# Patient Record
Sex: Female | Born: 1955 | Race: White | Hispanic: No | Marital: Single | Smoking: Former smoker
Health system: Southern US, Community
[De-identification: ages and names within clinical notes are randomized; demographics above are authoritative.]

## PROBLEM LIST (undated history)

## (undated) DIAGNOSIS — R200 Anesthesia of skin: Secondary | ICD-10-CM

## (undated) DIAGNOSIS — G8929 Other chronic pain: Secondary | ICD-10-CM

## (undated) DIAGNOSIS — M419 Scoliosis, unspecified: Secondary | ICD-10-CM

## (undated) DIAGNOSIS — E559 Vitamin D deficiency, unspecified: Secondary | ICD-10-CM

## (undated) DIAGNOSIS — G2581 Restless legs syndrome: Secondary | ICD-10-CM

## (undated) DIAGNOSIS — N210 Calculus in bladder: Secondary | ICD-10-CM

## (undated) DIAGNOSIS — Z87898 Personal history of other specified conditions: Secondary | ICD-10-CM

## (undated) DIAGNOSIS — F909 Attention-deficit hyperactivity disorder, unspecified type: Secondary | ICD-10-CM

## (undated) DIAGNOSIS — F329 Major depressive disorder, single episode, unspecified: Secondary | ICD-10-CM

## (undated) DIAGNOSIS — Z8739 Personal history of other diseases of the musculoskeletal system and connective tissue: Secondary | ICD-10-CM

## (undated) DIAGNOSIS — M79606 Pain in leg, unspecified: Secondary | ICD-10-CM

## (undated) DIAGNOSIS — K449 Diaphragmatic hernia without obstruction or gangrene: Secondary | ICD-10-CM

## (undated) DIAGNOSIS — R202 Paresthesia of skin: Secondary | ICD-10-CM

## (undated) DIAGNOSIS — K219 Gastro-esophageal reflux disease without esophagitis: Secondary | ICD-10-CM

## (undated) DIAGNOSIS — Z9289 Personal history of other medical treatment: Secondary | ICD-10-CM

## (undated) DIAGNOSIS — Z973 Presence of spectacles and contact lenses: Secondary | ICD-10-CM

## (undated) DIAGNOSIS — Z87828 Personal history of other (healed) physical injury and trauma: Secondary | ICD-10-CM

## (undated) DIAGNOSIS — K56609 Unspecified intestinal obstruction, unspecified as to partial versus complete obstruction: Secondary | ICD-10-CM

## (undated) DIAGNOSIS — G4700 Insomnia, unspecified: Secondary | ICD-10-CM

## (undated) HISTORY — DX: Vitamin D deficiency, unspecified: E55.9

## (undated) HISTORY — PX: PERCUTANEOUS PINNING TOE FRACTURE: SUR1018

## (undated) HISTORY — DX: Other chronic pain: G89.29

## (undated) HISTORY — DX: Diaphragmatic hernia without obstruction or gangrene: K44.9

## (undated) HISTORY — DX: Scoliosis, unspecified: M41.9

## (undated) HISTORY — DX: Unspecified intestinal obstruction, unspecified as to partial versus complete obstruction: K56.609

## (undated) HISTORY — PX: OTHER SURGICAL HISTORY: SHX169

## (undated) HISTORY — DX: Personal history of other medical treatment: Z92.89

## (undated) HISTORY — DX: Paresthesia of skin: R20.2

## (undated) HISTORY — PX: ANTERIOR AND POSTERIOR REPAIR: SHX1172

---

## 2004-06-10 ENCOUNTER — Emergency Department (HOSPITAL_COMMUNITY): Admission: EM | Admit: 2004-06-10 | Discharge: 2004-06-10 | Payer: Self-pay | Admitting: Emergency Medicine

## 2004-10-15 ENCOUNTER — Emergency Department (HOSPITAL_COMMUNITY): Admission: EM | Admit: 2004-10-15 | Discharge: 2004-10-15 | Payer: Self-pay | Admitting: Family Medicine

## 2004-10-18 ENCOUNTER — Emergency Department (HOSPITAL_COMMUNITY): Admission: EM | Admit: 2004-10-18 | Discharge: 2004-10-18 | Payer: Self-pay | Admitting: Family Medicine

## 2005-01-11 ENCOUNTER — Emergency Department (HOSPITAL_COMMUNITY): Admission: EM | Admit: 2005-01-11 | Discharge: 2005-01-11 | Payer: Self-pay | Admitting: Family Medicine

## 2005-06-21 ENCOUNTER — Ambulatory Visit: Payer: Self-pay | Admitting: Internal Medicine

## 2005-07-08 ENCOUNTER — Ambulatory Visit: Payer: Self-pay | Admitting: Internal Medicine

## 2005-07-09 ENCOUNTER — Emergency Department (HOSPITAL_COMMUNITY): Admission: EM | Admit: 2005-07-09 | Discharge: 2005-07-09 | Payer: Self-pay | Admitting: Family Medicine

## 2005-08-04 ENCOUNTER — Emergency Department (HOSPITAL_COMMUNITY): Admission: EM | Admit: 2005-08-04 | Discharge: 2005-08-04 | Payer: Self-pay | Admitting: Family Medicine

## 2005-09-01 ENCOUNTER — Emergency Department (HOSPITAL_COMMUNITY): Admission: EM | Admit: 2005-09-01 | Discharge: 2005-09-01 | Payer: Self-pay | Admitting: Family Medicine

## 2005-09-20 ENCOUNTER — Encounter: Admission: RE | Admit: 2005-09-20 | Discharge: 2005-09-20 | Payer: Self-pay | Admitting: Family Medicine

## 2005-10-25 ENCOUNTER — Emergency Department (HOSPITAL_COMMUNITY): Admission: EM | Admit: 2005-10-25 | Discharge: 2005-10-25 | Payer: Self-pay | Admitting: Family Medicine

## 2005-12-19 ENCOUNTER — Ambulatory Visit: Payer: Self-pay | Admitting: Internal Medicine

## 2006-01-26 ENCOUNTER — Ambulatory Visit: Payer: Self-pay | Admitting: Internal Medicine

## 2006-02-15 ENCOUNTER — Ambulatory Visit: Payer: Self-pay | Admitting: Internal Medicine

## 2006-02-24 ENCOUNTER — Ambulatory Visit: Payer: Self-pay | Admitting: Internal Medicine

## 2006-03-17 ENCOUNTER — Ambulatory Visit: Payer: Self-pay | Admitting: Internal Medicine

## 2006-03-21 ENCOUNTER — Ambulatory Visit: Payer: Self-pay | Admitting: Internal Medicine

## 2006-03-31 ENCOUNTER — Inpatient Hospital Stay (HOSPITAL_COMMUNITY): Admission: EM | Admit: 2006-03-31 | Discharge: 2006-04-04 | Payer: Self-pay | Admitting: *Deleted

## 2006-03-31 ENCOUNTER — Encounter: Payer: Self-pay | Admitting: Emergency Medicine

## 2006-03-31 ENCOUNTER — Ambulatory Visit: Payer: Self-pay | Admitting: Psychiatry

## 2006-04-19 ENCOUNTER — Emergency Department (HOSPITAL_COMMUNITY): Admission: EM | Admit: 2006-04-19 | Discharge: 2006-04-19 | Payer: Self-pay | Admitting: Family Medicine

## 2006-04-20 ENCOUNTER — Emergency Department (HOSPITAL_COMMUNITY): Admission: EM | Admit: 2006-04-20 | Discharge: 2006-04-20 | Payer: Self-pay | Admitting: Emergency Medicine

## 2006-06-13 ENCOUNTER — Emergency Department (HOSPITAL_COMMUNITY): Admission: EM | Admit: 2006-06-13 | Discharge: 2006-06-13 | Payer: Self-pay | Admitting: Family Medicine

## 2006-06-27 ENCOUNTER — Other Ambulatory Visit: Admission: RE | Admit: 2006-06-27 | Discharge: 2006-06-27 | Payer: Self-pay | Admitting: Obstetrics and Gynecology

## 2006-08-09 ENCOUNTER — Encounter (INDEPENDENT_AMBULATORY_CARE_PROVIDER_SITE_OTHER): Payer: Self-pay | Admitting: Specialist

## 2006-08-10 ENCOUNTER — Inpatient Hospital Stay (HOSPITAL_COMMUNITY): Admission: RE | Admit: 2006-08-10 | Discharge: 2006-08-11 | Payer: Self-pay | Admitting: Obstetrics and Gynecology

## 2006-09-04 ENCOUNTER — Ambulatory Visit: Payer: Self-pay | Admitting: Family Medicine

## 2006-12-08 ENCOUNTER — Ambulatory Visit (HOSPITAL_COMMUNITY): Payer: Self-pay | Admitting: Psychiatry

## 2006-12-28 ENCOUNTER — Ambulatory Visit: Payer: Self-pay | Admitting: Family Medicine

## 2007-01-15 ENCOUNTER — Emergency Department (HOSPITAL_COMMUNITY): Admission: EM | Admit: 2007-01-15 | Discharge: 2007-01-15 | Payer: Self-pay | Admitting: Family Medicine

## 2007-06-25 DIAGNOSIS — F329 Major depressive disorder, single episode, unspecified: Secondary | ICD-10-CM | POA: Insufficient documentation

## 2007-06-26 ENCOUNTER — Ambulatory Visit: Payer: Self-pay | Admitting: Internal Medicine

## 2007-06-26 DIAGNOSIS — G2581 Restless legs syndrome: Secondary | ICD-10-CM | POA: Insufficient documentation

## 2007-07-11 ENCOUNTER — Telehealth (INDEPENDENT_AMBULATORY_CARE_PROVIDER_SITE_OTHER): Payer: Self-pay

## 2007-07-24 ENCOUNTER — Telehealth: Payer: Self-pay | Admitting: Internal Medicine

## 2007-08-27 ENCOUNTER — Ambulatory Visit: Payer: Self-pay | Admitting: Internal Medicine

## 2007-10-09 ENCOUNTER — Ambulatory Visit: Payer: Self-pay | Admitting: Internal Medicine

## 2007-10-31 ENCOUNTER — Ambulatory Visit: Payer: Self-pay | Admitting: Internal Medicine

## 2007-10-31 DIAGNOSIS — J069 Acute upper respiratory infection, unspecified: Secondary | ICD-10-CM | POA: Insufficient documentation

## 2007-11-02 ENCOUNTER — Ambulatory Visit: Payer: Self-pay | Admitting: Internal Medicine

## 2007-12-11 ENCOUNTER — Ambulatory Visit: Payer: Self-pay | Admitting: Internal Medicine

## 2007-12-17 ENCOUNTER — Telehealth: Payer: Self-pay | Admitting: Internal Medicine

## 2007-12-28 ENCOUNTER — Ambulatory Visit: Payer: Self-pay | Admitting: Internal Medicine

## 2008-02-11 ENCOUNTER — Ambulatory Visit: Payer: Self-pay | Admitting: Internal Medicine

## 2008-02-11 DIAGNOSIS — M542 Cervicalgia: Secondary | ICD-10-CM | POA: Insufficient documentation

## 2008-02-18 ENCOUNTER — Ambulatory Visit: Payer: Self-pay | Admitting: Internal Medicine

## 2008-02-18 DIAGNOSIS — T07XXXA Unspecified multiple injuries, initial encounter: Secondary | ICD-10-CM | POA: Insufficient documentation

## 2008-03-31 ENCOUNTER — Ambulatory Visit: Payer: Self-pay | Admitting: Internal Medicine

## 2008-04-24 ENCOUNTER — Telehealth: Payer: Self-pay | Admitting: Internal Medicine

## 2008-06-20 ENCOUNTER — Telehealth: Payer: Self-pay | Admitting: Family Medicine

## 2008-06-20 ENCOUNTER — Ambulatory Visit: Payer: Self-pay | Admitting: Family Medicine

## 2008-06-20 DIAGNOSIS — S335XXA Sprain of ligaments of lumbar spine, initial encounter: Secondary | ICD-10-CM | POA: Insufficient documentation

## 2008-06-21 ENCOUNTER — Ambulatory Visit: Payer: Self-pay | Admitting: Family Medicine

## 2008-06-25 ENCOUNTER — Emergency Department (HOSPITAL_COMMUNITY): Admission: EM | Admit: 2008-06-25 | Discharge: 2008-06-25 | Payer: Self-pay | Admitting: Emergency Medicine

## 2008-06-25 ENCOUNTER — Telehealth: Payer: Self-pay | Admitting: Internal Medicine

## 2008-07-07 ENCOUNTER — Ambulatory Visit: Payer: Self-pay | Admitting: Internal Medicine

## 2008-07-08 ENCOUNTER — Telehealth (INDEPENDENT_AMBULATORY_CARE_PROVIDER_SITE_OTHER): Payer: Self-pay | Admitting: *Deleted

## 2008-07-08 ENCOUNTER — Telehealth: Payer: Self-pay | Admitting: Internal Medicine

## 2008-08-25 ENCOUNTER — Ambulatory Visit: Payer: Self-pay | Admitting: Internal Medicine

## 2008-08-28 ENCOUNTER — Telehealth (INDEPENDENT_AMBULATORY_CARE_PROVIDER_SITE_OTHER): Payer: Self-pay

## 2008-09-01 ENCOUNTER — Ambulatory Visit: Payer: Self-pay | Admitting: Internal Medicine

## 2008-09-01 DIAGNOSIS — M549 Dorsalgia, unspecified: Secondary | ICD-10-CM | POA: Insufficient documentation

## 2008-09-30 ENCOUNTER — Telehealth: Payer: Self-pay | Admitting: Internal Medicine

## 2008-10-02 ENCOUNTER — Ambulatory Visit: Payer: Self-pay | Admitting: Internal Medicine

## 2008-10-02 DIAGNOSIS — M545 Low back pain, unspecified: Secondary | ICD-10-CM | POA: Insufficient documentation

## 2008-10-16 ENCOUNTER — Telehealth: Payer: Self-pay | Admitting: Internal Medicine

## 2008-10-24 ENCOUNTER — Ambulatory Visit: Payer: Self-pay | Admitting: Internal Medicine

## 2008-11-03 ENCOUNTER — Ambulatory Visit: Payer: Self-pay | Admitting: Internal Medicine

## 2008-11-03 DIAGNOSIS — S9030XA Contusion of unspecified foot, initial encounter: Secondary | ICD-10-CM | POA: Insufficient documentation

## 2008-11-10 ENCOUNTER — Telehealth: Payer: Self-pay | Admitting: Internal Medicine

## 2008-11-18 ENCOUNTER — Ambulatory Visit: Payer: Self-pay | Admitting: Internal Medicine

## 2008-11-19 ENCOUNTER — Telehealth: Payer: Self-pay | Admitting: Internal Medicine

## 2008-11-20 ENCOUNTER — Telehealth: Payer: Self-pay | Admitting: Internal Medicine

## 2008-11-27 ENCOUNTER — Telehealth (INDEPENDENT_AMBULATORY_CARE_PROVIDER_SITE_OTHER): Payer: Self-pay | Admitting: *Deleted

## 2008-12-01 ENCOUNTER — Ambulatory Visit: Payer: Self-pay | Admitting: Internal Medicine

## 2008-12-01 DIAGNOSIS — R109 Unspecified abdominal pain: Secondary | ICD-10-CM | POA: Insufficient documentation

## 2008-12-02 ENCOUNTER — Telehealth: Payer: Self-pay | Admitting: Internal Medicine

## 2008-12-10 ENCOUNTER — Ambulatory Visit: Payer: Self-pay | Admitting: Internal Medicine

## 2009-02-02 ENCOUNTER — Emergency Department (HOSPITAL_COMMUNITY): Admission: EM | Admit: 2009-02-02 | Discharge: 2009-02-02 | Payer: Self-pay | Admitting: Emergency Medicine

## 2009-02-03 ENCOUNTER — Ambulatory Visit: Payer: Self-pay | Admitting: Internal Medicine

## 2009-02-03 DIAGNOSIS — S91309A Unspecified open wound, unspecified foot, initial encounter: Secondary | ICD-10-CM | POA: Insufficient documentation

## 2009-03-06 ENCOUNTER — Telehealth: Payer: Self-pay | Admitting: Internal Medicine

## 2009-03-09 ENCOUNTER — Telehealth: Payer: Self-pay | Admitting: Internal Medicine

## 2009-03-09 ENCOUNTER — Ambulatory Visit: Payer: Self-pay | Admitting: Internal Medicine

## 2009-03-13 ENCOUNTER — Telehealth: Payer: Self-pay | Admitting: Internal Medicine

## 2009-03-25 ENCOUNTER — Ambulatory Visit: Payer: Self-pay | Admitting: Family Medicine

## 2009-03-27 ENCOUNTER — Telehealth: Payer: Self-pay | Admitting: Family Medicine

## 2009-04-14 ENCOUNTER — Ambulatory Visit: Payer: Self-pay | Admitting: Internal Medicine

## 2009-04-15 ENCOUNTER — Telehealth: Payer: Self-pay | Admitting: Internal Medicine

## 2009-04-17 ENCOUNTER — Telehealth: Payer: Self-pay | Admitting: Internal Medicine

## 2009-04-17 ENCOUNTER — Encounter (INDEPENDENT_AMBULATORY_CARE_PROVIDER_SITE_OTHER): Payer: Self-pay | Admitting: *Deleted

## 2009-04-22 ENCOUNTER — Ambulatory Visit: Payer: Self-pay | Admitting: Internal Medicine

## 2009-04-22 DIAGNOSIS — IMO0002 Reserved for concepts with insufficient information to code with codable children: Secondary | ICD-10-CM | POA: Insufficient documentation

## 2009-04-23 ENCOUNTER — Ambulatory Visit: Payer: Self-pay | Admitting: Internal Medicine

## 2009-04-24 ENCOUNTER — Telehealth: Payer: Self-pay | Admitting: Internal Medicine

## 2009-04-27 ENCOUNTER — Telehealth: Payer: Self-pay | Admitting: Internal Medicine

## 2009-04-27 ENCOUNTER — Telehealth: Payer: Self-pay | Admitting: Family Medicine

## 2009-04-28 ENCOUNTER — Ambulatory Visit: Payer: Self-pay | Admitting: Internal Medicine

## 2009-04-28 LAB — CONVERTED CEMR LAB
Glucose, Urine, Semiquant: NEGATIVE
pH: 5

## 2009-04-30 ENCOUNTER — Telehealth: Payer: Self-pay | Admitting: Internal Medicine

## 2009-04-30 ENCOUNTER — Encounter (INDEPENDENT_AMBULATORY_CARE_PROVIDER_SITE_OTHER): Payer: Self-pay | Admitting: *Deleted

## 2009-05-05 ENCOUNTER — Ambulatory Visit: Payer: Self-pay | Admitting: Internal Medicine

## 2009-05-07 ENCOUNTER — Telehealth: Payer: Self-pay | Admitting: Internal Medicine

## 2009-05-11 ENCOUNTER — Telehealth: Payer: Self-pay | Admitting: Internal Medicine

## 2009-06-22 ENCOUNTER — Telehealth: Payer: Self-pay | Admitting: Internal Medicine

## 2009-06-22 ENCOUNTER — Ambulatory Visit: Payer: Self-pay | Admitting: Internal Medicine

## 2009-06-30 ENCOUNTER — Ambulatory Visit: Payer: Self-pay | Admitting: Internal Medicine

## 2009-07-22 ENCOUNTER — Ambulatory Visit: Payer: Self-pay | Admitting: Internal Medicine

## 2009-08-28 ENCOUNTER — Ambulatory Visit: Payer: Self-pay | Admitting: Internal Medicine

## 2009-08-28 DIAGNOSIS — L570 Actinic keratosis: Secondary | ICD-10-CM | POA: Insufficient documentation

## 2009-09-17 ENCOUNTER — Telehealth: Payer: Self-pay | Admitting: Internal Medicine

## 2009-10-30 ENCOUNTER — Ambulatory Visit: Payer: Self-pay | Admitting: Internal Medicine

## 2010-01-26 ENCOUNTER — Ambulatory Visit: Payer: Self-pay | Admitting: Internal Medicine

## 2010-01-26 DIAGNOSIS — G4700 Insomnia, unspecified: Secondary | ICD-10-CM | POA: Insufficient documentation

## 2010-02-04 ENCOUNTER — Encounter: Payer: Self-pay | Admitting: Internal Medicine

## 2010-02-04 ENCOUNTER — Ambulatory Visit: Payer: Self-pay | Admitting: Family Medicine

## 2010-02-04 DIAGNOSIS — S90129A Contusion of unspecified lesser toe(s) without damage to nail, initial encounter: Secondary | ICD-10-CM | POA: Insufficient documentation

## 2010-04-19 ENCOUNTER — Telehealth: Payer: Self-pay | Admitting: Internal Medicine

## 2010-04-19 ENCOUNTER — Ambulatory Visit: Payer: Self-pay | Admitting: Internal Medicine

## 2010-04-21 ENCOUNTER — Ambulatory Visit: Payer: Self-pay | Admitting: Internal Medicine

## 2010-05-04 ENCOUNTER — Telehealth: Payer: Self-pay

## 2010-05-11 ENCOUNTER — Telehealth: Payer: Self-pay | Admitting: Internal Medicine

## 2010-05-13 ENCOUNTER — Telehealth: Payer: Self-pay

## 2010-06-10 ENCOUNTER — Ambulatory Visit: Payer: Self-pay | Admitting: Internal Medicine

## 2010-06-16 ENCOUNTER — Telehealth: Payer: Self-pay | Admitting: Internal Medicine

## 2010-07-08 ENCOUNTER — Encounter: Payer: Self-pay | Admitting: Internal Medicine

## 2010-07-08 ENCOUNTER — Encounter
Admission: RE | Admit: 2010-07-08 | Discharge: 2010-07-28 | Payer: Self-pay | Source: Home / Self Care | Attending: Internal Medicine | Admitting: Internal Medicine

## 2010-07-19 ENCOUNTER — Ambulatory Visit: Payer: Self-pay | Admitting: Internal Medicine

## 2010-07-23 ENCOUNTER — Ambulatory Visit: Payer: Self-pay | Admitting: Internal Medicine

## 2010-07-28 ENCOUNTER — Ambulatory Visit: Payer: Self-pay | Admitting: Internal Medicine

## 2010-08-22 ENCOUNTER — Encounter: Payer: Self-pay | Admitting: Emergency Medicine

## 2010-08-24 ENCOUNTER — Ambulatory Visit
Admission: RE | Admit: 2010-08-24 | Discharge: 2010-08-24 | Payer: Self-pay | Source: Home / Self Care | Attending: Internal Medicine | Admitting: Internal Medicine

## 2010-08-24 ENCOUNTER — Encounter: Payer: Self-pay | Admitting: Internal Medicine

## 2010-08-25 ENCOUNTER — Telehealth: Payer: Self-pay

## 2010-08-31 NOTE — Assessment & Plan Note (Signed)
Summary: CONSULT RE: TOE FUNGUS/CJR----PT [facility] // RS   Vital Signs:  [name] profile:   55 year old female Weight:      126 pounds Temp:     98.2 degrees F oral BP sitting:   110 / 80  (left arm) Cuff size:   regular  Vitals Entered By: Duard Brady LPN (April 19, 2010 11:35 AM) CC: c/o (R) great toe nail fungus - also needs refills on meds     declines flu vaccine Is [name] Diabetic? No   CC:  c/o (R) great toe nail fungus - also needs refills on meds     declines flu vaccine.  History of Present Illness:  Deborah [name] who is seen today for follow-up.  She has a history of anxiety, depression, which has been stable.  She has insomnia, which is also recently well controlled.  She is in today for medicine refills.  She has occasional low back pain, which is not a complaint today. she is concerned about a possible toenail fungus involving her left great toenail  Allergies (verified): No Known Drug Allergies  Past History:  Past Medical History: Reviewed history from 10/02/2008 and no changes required. Depression-bipolar Low back pain  Physical Exam  General:  Well-developed,well-nourished,in no acute distress; alert,appropriate and cooperative throughout examination Skin:  the left great toenail was slightly discolored, but not thickened or distorted Psych:  Cognition and judgment appear intact. Alert and cooperative with normal attention span and concentration. No apparent delusions, illusions, hallucinations   Impression & Recommendations:  Problem # 1:  BACK PAIN (ICD-724.5)  Her updated medication list for this problem includes:    Hydrocodone-acetaminophen 5-500 Mg Tabs (Hydrocodone-acetaminophen) ..... One every 6 hours as needed for pain    Ibuprofen 200 Mg Tabs (Ibuprofen) .Marland Kitchen... 2 tis  Her updated medication list for this problem includes:    Hydrocodone-acetaminophen 5-500 Mg Tabs (Hydrocodone-acetaminophen) ..... One every 6 hours as needed for  pain    Ibuprofen 200 Mg Tabs (Ibuprofen) .Marland Kitchen... 2 tis  Problem # 2:  DEPRESSION (ICD-311)  Her updated medication list for this problem includes:    Budeprion Xl 150 Mg Xr24h-tab (Bupropion hcl) .Marland KitchenMarland KitchenMarland KitchenMarland Kitchen 3 tablets daily  Her updated medication list for this problem includes:    Budeprion Xl 150 Mg Xr24h-tab (Bupropion hcl) .Marland KitchenMarland KitchenMarland KitchenMarland Kitchen 3 tablets daily  Complete Medication List: 1)  Budeprion Xl 150 Mg Xr24h-tab (Bupropion hcl) .... 3 tablets daily 2)  Hydrocodone-acetaminophen 5-500 Mg Tabs (Hydrocodone-acetaminophen) .... One every 6 hours as needed for pain 3)  Retin-a 0.1 % Crea (Tretinoin) .... Use  at night 4)  Ibuprofen 200 Mg Tabs (Ibuprofen) .... 2 tis 5)  Zolpidem Tartrate 5 Mg Tabs (Zolpidem tartrate) .... One at bedtime as needed for sleep  [name] Instructions: 1)  Please schedule a follow-up appointment in 3 months. 2)  It is important that you exercise regularly at least 20 minutes 5 times a week. If you develop chest pain, have severe difficulty breathing, or feel very tired , stop exercising immediately and seek medical attention. Prescriptions: ZOLPIDEM TARTRATE 5 MG TABS (ZOLPIDEM TARTRATE) one at bedtime as needed for sleep  #30 x 2   Entered and Authorized by:   Gordy Savers  MD   Signed by:   Gordy Savers  MD on 04/19/2010   Method used:   Print then Give to [name]   RxID:   [id number] RETIN-A 0.1 % CREA (TRETINOIN) use  at night  #30gm x 3   Entered  and Authorized by:   Gordy Savers  MD   Signed by:   Gordy Savers  MD on 04/19/2010   Method used:   Print then Give to [name]   RxID:   [id number] HYDROCODONE-ACETAMINOPHEN 5-500 MG TABS (HYDROCODONE-ACETAMINOPHEN) one every 6 hours as needed for pain  #60 x 3   Entered and Authorized by:   Gordy Savers  MD   Signed by:   Gordy Savers  MD on 04/19/2010   Method used:   Print then Give to [name]   RxID:   [id number] BUDEPRION XL 150 MG XR24H-TAB (BUPROPION HCL)  3 tablets daily  #90 x 3   Entered and Authorized by:   Gordy Savers  MD   Signed by:   Gordy Savers  MD on 04/19/2010   Method used:   Print then Give to [name]   RxID:   [id number]

## 2010-08-31 NOTE — Progress Notes (Signed)
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 Pt will bring forms by when she returns back to town.  Initial call taken by: Lucy Antigua,  May 04, 2010 10:53 AM  Follow-up for Phone Call        noted   KIK Follow-up by: Duard Brady LPN,  May 04, 2010 11:10 AM

## 2010-08-31 NOTE — Assessment & Plan Note (Signed)
Summary: suspicious looking moles on neck and stomach/cjr/pt rsc/cjr   Vital Signs:  Patient profile:   55 year old female Weight:      125 pounds BP sitting:   114 / 82  (left arm) Cuff size:   regular  Vitals Entered By: Raechel Ache, RN (August 28, 2009 4:05 PM)   CC: Check skin spots. Is Patient Diabetic? No   CC:  Check skin spots..  History of Present Illness: 55 year old patient who is seen today concerned about a lesion involving her right upper anterior chest.  This has been present for several weeks and has unchanged.  It involves an area of previous considerable some exposure.  The lesion was consistent with a small actinic keratosis. She has a history of depression, which has been stable.  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    Physical Exam  General:  Well-developed,well-nourished,in no acute distress; alert,appropriate and cooperative throughout examination Skin:  a slightly raised papular scaly lesion 3 to 4 mm noted involving her right upper anterior chest.  This was consistent with a small actinic keratosis   Impression & Recommendations:  Problem # 1:  BACK PAIN (ICD-724.5)  The following medications were removed from the medication list:    Cyclobenzaprine Hcl 5 Mg Tabs (Cyclobenzaprine hcl) ..... One at bedtime Her updated medication list for this problem includes:    Hydrocodone-acetaminophen 5-500 Mg Tabs (Hydrocodone-acetaminophen) ..... One every 6 hours as needed for pain  Her updated medication list for this problem includes:    Cyclobenzaprine Hcl 5 Mg Tabs (Cyclobenzaprine hcl) ..... One at bedtime    Hydrocodone-acetaminophen 5-500 Mg Tabs (Hydrocodone-acetaminophen) ..... One every 6 hours as needed for pain  Problem # 2:  DEPRESSION (ICD-311)  Her updated medication list for this  problem includes:    Budeprion Xl 150 Mg Xr24h-tab (Bupropion hcl) .Marland KitchenMarland KitchenMarland KitchenMarland Kitchen 3 tablets daily    Prozac 40 Mg Caps (Fluoxetine hcl) .Marland Kitchen... 1 by mouth once daily    Nortriptyline Hcl 25 Mg Caps (Nortriptyline hcl) ..... One daily  Her updated medication list for this problem includes:    Budeprion Xl 150 Mg Xr24h-tab (Bupropion hcl) .Marland KitchenMarland KitchenMarland KitchenMarland Kitchen 3 tablets daily    Prozac 40 Mg Caps (Fluoxetine hcl) .Marland Kitchen... 1 by mouth once daily    Nortriptyline Hcl 25 Mg Caps (Nortriptyline hcl) ..... One daily  Problem # 3:  ACTINIC KERATOSIS (ICD-702.0)  treated with cryotherapy  Orders: Cryotherapy/Destruction benign or premalignant lesion (1st lesion)  ([address])  Complete Medication List: 1)  Budeprion Xl 150 Mg Xr24h-tab (Bupropion hcl) .... 3 tablets daily 2)  Prozac 40 Mg Caps (Fluoxetine hcl) .Marland Kitchen.. 1 by mouth once daily 3)  Nortriptyline Hcl 25 Mg Caps (Nortriptyline hcl) .... One daily 4)  Hydrocodone-acetaminophen 5-500 Mg Tabs (Hydrocodone-acetaminophen) .... One every 6 hours as needed for pain 5)  Retin-a 0.1 % Crea (Tretinoin) .... Use  at night  Patient Instructions: 1)  Please schedule a follow-up appointment in 3 months. 2)  It is important that you exercise regularly at least 20 minutes 5 times a week. If you develop chest pain, have severe difficulty breathing, or feel very tired , stop exercising immediately and seek medical attention. Prescriptions: HYDROCODONE-ACETAMINOPHEN 5-500 MG TABS (HYDROCODONE-ACETAMINOPHEN) one every 6 hours as needed for pain  #120 x 2   Entered and Authorized by:  Gordy Savers  MD   Signed by:   Gordy Savers  MD on 08/28/2009   Method used:   Print then Give to Patient   RxID:   [id number]

## 2010-08-31 NOTE — Assessment & Plan Note (Signed)
Summary: tb test reading 1pm ok per kim/njr   Nurse Visit   Allergies: No Known Drug Allergies  PPD Results    Date of reading: 04/21/2010    Results: < 5mm    Interpretation: negative

## 2010-08-31 NOTE — Progress Notes (Signed)
Summary: call  Phone Note Call from Patient Call back at Home Phone [phone]   Caller: Patient---live call Reason for Call: Talk to Nurse Summary of Call: wants kim to return call. Initial call taken by: Warnell Forester,  September 17, 2009 12:51 PM  Follow-up for Phone Call        attempt to call - ans mach . LMTCB .  Follow-up by: Duard Brady LPN,  September 17, 2009 2:15 PM

## 2010-08-31 NOTE — Miscellaneous (Signed)
Summary: Controlled Substances Contract  Controlled Substances Contract   Imported By: Maryln Gottron 02/05/2010 14:56:24  _____________________________________________________________________  External Attachment:    Type:   Image     Comment:   External Document

## 2010-08-31 NOTE — Progress Notes (Signed)
Summary: REQ FOR RETURN CALL  Phone Note Call from Patient   Caller: Patient   [phone] Summary of Call: Pt would like to have a return call from Selena Batten, [place].... Marland KitchenPt would not elaborate as to the reason why, just said she needs to speak with Selena Batten, RN..... Pt can be reached at [phone].  Initial call taken by: Debbra Riding,  May 13, 2010 1:38 PM  Follow-up for Phone Call        returned call to pt - pt upset because according to her - she did not request early refill and is [facility] upest [facility] for [facility]. she is going to pharmacy tomorrow to discuss with pharmacist and manager. KIK Follow-up by: Duard Brady LPN,  May 13, 2010 2:06 PM

## 2010-08-31 NOTE — Assessment & Plan Note (Signed)
Summary: pt jammed big toe/painful/cjr   Vital Signs:  Patient profile:   55 year old female Weight:      122 pounds Temp:     98.2 degrees F oral BP sitting:   120 / 80  (left arm) Cuff size:   regular  Vitals Entered By: Kathrynn Speed CMA (February 04, 2010 12:15 PM) CC: Pt jammed big right toe, painful, x 4 days,src   CC:  Pt jammed big right toe, painful, x 4 days, and src.  History of Present Illness: Deborah [name] is a 55 year old female, who comes in today for evaluation of two problems.  She states she jammed [place] the airport [place] and wants it checked.  There is no bleeding.  No swelling.  She also states they lost her luggage with her medication.  She wants an early refill on her Wellbutrin and her Vicodin.  She states that she used to take Darvocet now.  She takes 3 to 4 Vicodin a day because of back and neck pain.  She states the etiology of her pain is muscle spasm and stress.  I inquired if she had a neurologic evaluation, but she was extremely vague.  I told her I would refill her medications only until Dr. Kirtland Bouchard  comes back and that she will need to sign a pain contract  Current Medications (verified): 1)  Budeprion Xl 150 Mg Xr24h-Tab (Bupropion Hcl) .... 3 Tablets Daily 2)  Hydrocodone-Acetaminophen 5-500 Mg Tabs (Hydrocodone-Acetaminophen) .... One Every 6 Hours As Needed For Pain 3)  Retin-A 0.1 % Crea (Tretinoin) .... Use  At Night 4)  Ibuprofen 200 Mg Tabs (Ibuprofen) .... 2 Tis 5)  Zolpidem Tartrate 5 Mg Tabs (Zolpidem Tartrate) .... One At Bedtime As Needed For Sleep  Allergies (verified): No Known Drug Allergies  Review of Systems      See HPI  Physical Exam  General:  Well-developed,well-nourished,in no acute distress; alert,appropriate and cooperative throughout examination Msk:  examination of the toe shows no evidence of any swelling, bleeding, or deformity. Pulses:  R and L carotid,radial,femoral,dorsalis pedis and posterior tibial  pulses are full and equal bilaterally   Impression & Recommendations:  Problem # 1:  CONTUSION OF TOE (ICD-924.3) Assessment New  Complete Medication List: 1)  Budeprion Xl 150 Mg Xr24h-tab (Bupropion hcl) .... 3 tablets daily 2)  Hydrocodone-acetaminophen 5-500 Mg Tabs (Hydrocodone-acetaminophen) .... One every 6 hours as needed for pain 3)  Retin-a 0.1 % Crea (Tretinoin) .... Use  at night 4)  Ibuprofen 200 Mg Tabs (Ibuprofen) .... 2 tis 5)  Zolpidem Tartrate 5 Mg Tabs (Zolpidem tartrate) .... One at bedtime as needed for sleep  Other Orders: Capillary Blood Glucose/CBG ([address])  Patient Instructions: 1)  take 600 mg of Motrin 3 times a day with food and elevation, ice 15 minutes 4 times a day for pain in your foot. 2)  We will call the pharmacy to get urine early refill on the Wellbutrin and he Uy have to call Dr. Kirtland Bouchard. for follow-up on your narcotics Prescriptions: BUDEPRION XL 150 MG XR24H-TAB (BUPROPION HCL) 3 tablets daily  #90 x 0   Entered by:   Kern Reap CMA (AAMA)   Authorized by:   Roderick Pee MD   Signed by:   Kern Reap CMA (AAMA) on 02/04/2010   Method used:   Telephoned to ...       [facility] [address] [id number] (retail)       [address]       [place], [place]  [address]       Ph: [phone] or [phone]       Fax: [phone]   RxID:   [id number] HYDROCODONE-ACETAMINOPHEN 5-500 MG TABS (HYDROCODONE-ACETAMINOPHEN) one every 6 hours as needed for pain  #15 x 0   Entered and Authorized by:   Roderick Pee MD   Signed by:   Roderick Pee MD on 02/04/2010   Method used:   Print then Give to Patient   RxID:   [id number]

## 2010-08-31 NOTE — Progress Notes (Signed)
Summary: pain med rx  Phone Note Call from Patient   Caller: Patient Call For: Gordy Savers  MD Summary of Call: pt requesting #120 on pain med due to being out of town for extended amounts of time. Can pick up new rx on Wed. When she comes back to TB reading. KIK Initial call taken by: Duard Brady LPN,  April 19, 2010 3:51 PM  Follow-up for Phone Call        noted Follow-up by: Gordy Savers  MD,  April 19, 2010 5:17 PM

## 2010-08-31 NOTE — Progress Notes (Signed)
Summary: early refill - denied  Phone Note Refill Request Message from:  Pharmacy-pat on  [address] [phone]  Refills Requested: Medication #1:  HYDROCODONE-ACETAMINOPHEN 5-500 MG TABS one every 6 hours as needed for pain pt would like a early refill  Initial call taken by: Heron Sabins,  May 11, 2010 8:30 AM  Follow-up for Phone Call        unable to do according to pain contract that we both signed Follow-up by: Gordy Savers  MD,  May 11, 2010 8:58 AM  Additional Follow-up for Phone Call Additional follow up Details #1::        spoke with pat at [facility] - no refill due to contract signed when in office. Pt will need to call us and be seen if needed. KIK Additional Follow-up by: Duard Brady LPN,  May 11, 2010 9:17 AM    New/Updated Medications: HYDROCODONE-ACETAMINOPHEN 5-500 MG TABS (HYDROCODONE-ACETAMINOPHEN) one every 6 hours as needed for pain

## 2010-08-31 NOTE — Assessment & Plan Note (Signed)
Summary: med check/cjr/pt rsc/cjr   Vital Signs:  Patient profile:   55 year old female Weight:      126 pounds Temp:     98.4 degrees F oral BP sitting:   110 / 80  (right arm) Cuff size:   regular  Vitals Entered By: Duard Brady LPN (June 10, 2010 10:50 AM) CC: medication review Is Patient Diabetic? No   CC:  medication review.  History of Present Illness: 55 year old patient who is seen today for follow-up; she has a history of depression, as well as chronic back pain.  She is requesting a physical therapy referral.  Due to upper back and neck pain.  She has lost her job in [place] and has relocated back to [place].  She will need early refills, due to the fact that her medications are in [place], and she does not intend to return.  Allergies (verified): No Known Drug Allergies  Review of Systems       The patient complains of depression.  The patient denies anorexia, fever, weight loss, weight gain, vision loss, decreased hearing, hoarseness, chest pain, syncope, dyspnea on exertion, peripheral edema, prolonged cough, headaches, hemoptysis, abdominal pain, melena, hematochezia, severe indigestion/heartburn, hematuria, incontinence, genital sores, muscle weakness, suspicious skin lesions, transient blindness, difficulty walking, unusual weight change, abnormal bleeding, enlarged lymph nodes, angioedema, and breast masses.    Physical Exam  General:  Well-developed,well-nourished,in no acute distress; alert,appropriate and cooperative throughout examination Head:  Normocephalic and atraumatic without obvious abnormalities. No apparent alopecia or balding. Neck:  No deformities, masses, or tenderness noted. Lungs:  Normal respiratory effort, chest expands symmetrically. Lungs are clear to auscultation, no crackles or wheezes. Heart:  Normal rate and regular rhythm. S1 and S2 normal without gallop, murmur, click, rub or other extra sounds.   Impression &  Recommendations:  Problem # 1:  LOW BACK PAIN (ICD-724.2)  Her updated medication list for this problem includes:    Hydrocodone-acetaminophen 5-500 Mg Tabs (Hydrocodone-acetaminophen) ..... One every 6 hours as needed for pain    Ibuprofen 200 Mg Tabs (Ibuprofen) .Marland Kitchen... 2 tis  Her updated medication list for this problem includes:    Hydrocodone-acetaminophen 5-500 Mg Tabs (Hydrocodone-acetaminophen) ..... One every 6 hours as needed for pain    Ibuprofen 200 Mg Tabs (Ibuprofen) .Marland Kitchen... 2 tis  Problem # 2:  DEPRESSION (ICD-311)  Her updated medication list for this problem includes:    Budeprion Xl 150 Mg Xr24h-tab (Bupropion hcl) .Marland KitchenMarland KitchenMarland KitchenMarland Kitchen 3  tablets daily  Her updated medication list for this problem includes:    Budeprion Xl 150 Mg Xr24h-tab (Bupropion hcl) .Marland KitchenMarland KitchenMarland KitchenMarland Kitchen 3  tablets daily  Complete Medication List: 1)  Budeprion Xl 150 Mg Xr24h-tab (Bupropion hcl) .... 3  tablets daily 2)  Hydrocodone-acetaminophen 5-500 Mg Tabs (Hydrocodone-acetaminophen) .... One every 6 hours as needed for pain 3)  Retin-a 0.1 % Crea (Tretinoin) .... Use  at night 4)  Ibuprofen 200 Mg Tabs (Ibuprofen) .... 2 tis 5)  Zolpidem Tartrate 5 Mg Tabs (Zolpidem tartrate) .... One at bedtime as needed for sleep  Other Orders: Physical Therapy Referral (PT)  Patient Instructions: 1)  physical therapy referral, as scheduled 2)  Please schedule a follow-up appointment in 3 months. 3)  It is important that you exercise regularly at least 20 minutes 5 times a week. If you develop chest pain, have severe difficulty breathing, or feel very tired , stop exercising immediately and seek medical attention. Prescriptions: BUDEPRION XL 150 MG XR24H-TAB (BUPROPION HCL) 2  tablets  daily  #28 x 0   Entered and Authorized by:   Gordy Savers  MD   Signed by:   Gordy Savers  MD on 06/10/2010   Method used:   Print then Give to Patient   RxID:   [id number] HYDROCODONE-ACETAMINOPHEN 5-500 MG TABS  (HYDROCODONE-ACETAMINOPHEN) one every 6 hours as needed for pain  #90 x 2   Entered and Authorized by:   Gordy Savers  MD   Signed by:   Gordy Savers  MD on 06/10/2010   Method used:   Print then Give to Patient   RxID:   [id number] BUDEPRION XL 150 MG XR24H-TAB (BUPROPION HCL) 3 tablets daily  #90 x 3   Entered and Authorized by:   Gordy Savers  MD   Signed by:   Gordy Savers  MD on 06/10/2010   Method used:   Print then Give to Patient   RxID:   [id number]    Orders Added: 1)  Est. Patient Level III [01027] 2)  Physical Therapy Referral [PT]

## 2010-08-31 NOTE — Progress Notes (Signed)
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 Amador Cunas. KIK

## 2010-08-31 NOTE — Assessment & Plan Note (Signed)
Summary: fu per pt/njr----PT [facility] // RS   Vital Signs:  Patient profile:   55 year old female Weight:      126 pounds Temp:     98.0 degrees F oral BP sitting:   110 / 76  (left arm) Cuff size:   regular  Vitals Entered By: Duard Brady LPN (January 26, 2010 8:01 AM) CC: f/u - has questions Is Patient Diabetic? No   CC:  f/u - has questions.  History of Present Illness: 55 year old patient who has a history of anxiety, depression, chronic low back pain.  She also complains of some insomnia.  Today she is followed by a Veterinary surgeon.  She self discontinued Prozac and may and has done well without this medication for the past month.  She states that she was having suicidal ideation.  Remains on Wellbutrin. should also is requesting consideration for hormone replacement therapy.  She has been postmenopausal for approximately 10 years.  Risks and benefits of treatment discussed  Allergies (verified): No Known Drug Allergies  Past History:  Past Medical History: Reviewed history from 10/02/2008 and no changes required. Depression-bipolar Low back pain  Review of Systems       The patient complains of depression.  The patient denies anorexia, fever, weight loss, weight gain, vision loss, decreased hearing, hoarseness, chest pain, syncope, dyspnea on exertion, peripheral edema, prolonged cough, headaches, hemoptysis, abdominal pain, melena, hematochezia, severe indigestion/heartburn, hematuria, incontinence, genital sores, muscle weakness, suspicious skin lesions, transient blindness, difficulty walking, unusual weight change, abnormal bleeding, enlarged lymph nodes, angioedema, and breast masses.    Physical Exam  General:  Well-developed,well-nourished,in no acute distress; alert,appropriate and cooperative throughout examination Head:  Normocephalic and atraumatic without obvious abnormalities. No apparent alopecia or balding. Eyes:  No corneal or conjunctival inflammation noted.  EOMI. Perrla. Funduscopic exam benign, without hemorrhages, exudates or papilledema. Vision grossly normal. Mouth:  Oral mucosa and oropharynx without lesions or exudates.  Teeth in good repair. Neck:  No deformities, masses, or tenderness noted. Lungs:  Normal respiratory effort, chest expands symmetrically. Lungs are clear to auscultation, no crackles or wheezes. Heart:  Normal rate and regular rhythm. S1 and S2 normal without gallop, murmur, click, rub or other extra sounds.   Impression & Recommendations:  Problem # 1:  INSOMNIA (ICD-780.52)  Her updated medication list for this problem includes:    Zolpidem Tartrate 5 Mg Tabs (Zolpidem tartrate) ..... One at bedtime as needed for sleep  Her updated medication list for this problem includes:    Zolpidem Tartrate 5 Mg Tabs (Zolpidem tartrate) ..... One at bedtime as needed for sleep  Problem # 2:  LOW BACK PAIN (ICD-724.2)  Her updated medication list for this problem includes:    Hydrocodone-acetaminophen 5-500 Mg Tabs (Hydrocodone-acetaminophen) ..... One every 6 hours as needed for pain    Ibuprofen 200 Mg Tabs (Ibuprofen) .Marland Kitchen... 2 tis  Her updated medication list for this problem includes:    Hydrocodone-acetaminophen 5-500 Mg Tabs (Hydrocodone-acetaminophen) ..... One every 6 hours as needed for pain    Ibuprofen 200 Mg Tabs (Ibuprofen) .Marland Kitchen... 2 tis  Problem # 3:  DEPRESSION (ICD-311)  The following medications were removed from the medication list:    Prozac 40 Mg Caps (Fluoxetine hcl) .Marland Kitchen... 1 by mouth once daily Her updated medication list for this problem includes:    Budeprion Xl 150 Mg Xr24h-tab (Bupropion hcl) .Marland KitchenMarland KitchenMarland KitchenMarland Kitchen 3 tablets daily  The following medications were removed from the medication list:    Prozac 40 Mg  Caps (Fluoxetine hcl) .Marland Kitchen... 1 by mouth once daily Her updated medication list for this problem includes:    Budeprion Xl 150 Mg Xr24h-tab (Bupropion hcl) .Marland KitchenMarland KitchenMarland KitchenMarland Kitchen 3 tablets daily  Complete Medication List: 1)   Budeprion Xl 150 Mg Xr24h-tab (Bupropion hcl) .... 3 tablets daily 2)  Hydrocodone-acetaminophen 5-500 Mg Tabs (Hydrocodone-acetaminophen) .... One every 6 hours as needed for pain 3)  Retin-a 0.1 % Crea (Tretinoin) .... Use  at night 4)  Ibuprofen 200 Mg Tabs (Ibuprofen) .... 2 tis 5)  Zolpidem Tartrate 5 Mg Tabs (Zolpidem tartrate) .... One at bedtime as needed for sleep  Patient Instructions: 1)  follow-up with your counselor, as scheduled 2)  Please schedule a follow-up appointment in 3 months. 3)  It is important that you exercise regularly at least 20 minutes 5 times a week. If you develop chest pain, have severe difficulty breathing, or feel very tired , stop exercising immediately and seek medical attention. Prescriptions: ZOLPIDEM TARTRATE 5 MG TABS (ZOLPIDEM TARTRATE) one at bedtime as needed for sleep  #30 x 2   Entered and Authorized by:   Gordy Savers  MD   Signed by:   Gordy Savers  MD on 01/26/2010   Method used:   Print then Mail to Patient   RxID:   [id number] HYDROCODONE-ACETAMINOPHEN 5-500 MG TABS (HYDROCODONE-ACETAMINOPHEN) one every 6 hours as needed for pain  #120 x 3   Entered and Authorized by:   Gordy Savers  MD   Signed by:   Gordy Savers  MD on 01/26/2010   Method used:   Print then Mail to Patient   RxID:   [id number]

## 2010-08-31 NOTE — Assessment & Plan Note (Signed)
Summary: MED CHECK AND REFILLS/CJR   Vital Signs:  [name] profile:   55 year old female Weight:      130 pounds Temp:     98.2 degrees F oral BP sitting:   122 / 76  (right arm) Cuff size:   regular  Vitals Entered By: Duard Brady LPN (October 30, 3293 11:43 AM) CC: f/u med refills - c/o head , neck and shoulder pain Is [name] Diabetic? No   CC:  f/u med refills - c/o head  and neck and shoulder pain.  History of Present Illness: Deborah [name] who is seen today for follow-up.  She is now  working and training in [place] but returns today for follow-up of her depression and chronic cervical and low back pain.  This has no well-controlled with Darvocet, which has been discontinued.  Presently, she is on hydrocodone.  She seems to doing fairly well, but it appears her job may only turn out to be part-time.  Her depression and mental status is stable.  Complaints today include head and shoulder discomfort.  Neck pain and low back pain.  She attributes much of this to increasing stress with her new physician and responsibilities  Allergies (verified): No Known Drug Allergies  Past History:  Past Medical History: Reviewed history from 10/02/2008 and no changes required. Depression-bipolar Low back pain  Review of Systems       The [name] complains of depression.  The [name] denies anorexia, fever, weight loss, weight gain, vision loss, decreased hearing, hoarseness, chest pain, syncope, dyspnea on exertion, peripheral edema, prolonged cough, headaches, hemoptysis, abdominal pain, melena, hematochezia, severe indigestion/heartburn, hematuria, incontinence, genital sores, muscle weakness, suspicious skin lesions, transient blindness, difficulty walking, unusual weight change, abnormal bleeding, enlarged lymph nodes, angioedema, and breast masses.    Physical Exam  General:  Well-developed,well-nourished,in no acute distress; alert,appropriate and cooperative throughout  examination Psych:  Cognition and judgment appear intact. Alert and cooperative with normal attention span and concentration. No apparent delusions, illusions, hallucinations   Impression & Recommendations:  Problem # 1:  LOW BACK PAIN (ICD-724.2)  Her updated medication list for this problem includes:    Hydrocodone-acetaminophen 5-500 Mg Tabs (Hydrocodone-acetaminophen) ..... One every 6 hours as needed for pain    Ibuprofen 200 Mg Tabs (Ibuprofen) .Marland Kitchen... 2 tis  Her updated medication list for this problem includes:    Hydrocodone-acetaminophen 5-500 Mg Tabs (Hydrocodone-acetaminophen) ..... One every 6 hours as needed for pain    Ibuprofen 200 Mg Tabs (Ibuprofen) .Marland Kitchen... 2 tis  Problem # 2:  DEPRESSION (ICD-311)  The following medications were removed from the medication list:    Nortriptyline Hcl 25 Mg Caps (Nortriptyline hcl) ..... One daily Her updated medication list for this problem includes:    Budeprion Xl 150 Mg Xr24h-tab (Bupropion hcl) .Marland KitchenMarland KitchenMarland KitchenMarland Kitchen 3 tablets daily    Prozac 40 Mg Caps (Fluoxetine hcl) .Marland Kitchen... 1 by mouth once daily  The following medications were removed from the medication list:    Nortriptyline Hcl 25 Mg Caps (Nortriptyline hcl) ..... One daily Her updated medication list for this problem includes:    Budeprion Xl 150 Mg Xr24h-tab (Bupropion hcl) .Marland KitchenMarland KitchenMarland KitchenMarland Kitchen 3 tablets daily    Prozac 40 Mg Caps (Fluoxetine hcl) .Marland Kitchen... 1 by mouth once daily  Complete Medication List: 1)  Budeprion Xl 150 Mg Xr24h-tab (Bupropion hcl) .... 3 tablets daily 2)  Prozac 40 Mg Caps (Fluoxetine hcl) .Marland Kitchen.. 1 by mouth once daily 3)  Hydrocodone-acetaminophen 5-500 Mg Tabs (Hydrocodone-acetaminophen) .Marland KitchenMarland KitchenMarland Kitchen  One every 6 hours as needed for pain 4)  Retin-a 0.1 % Crea (Tretinoin) .... Use  at night 5)  Ibuprofen 200 Mg Tabs (Ibuprofen) .... 2 tis  [name] Instructions: 1)  Please schedule a follow-up appointment in 4 months. 2)  It is important that you exercise regularly at least 20 minutes 5 times  a week. If you develop chest pain, have severe difficulty breathing, or feel very tired , stop exercising immediately and seek medical attention. Prescriptions: HYDROCODONE-ACETAMINOPHEN 5-500 MG TABS (HYDROCODONE-ACETAMINOPHEN) one every 6 hours as needed for pain  #120 x 3   Entered and Authorized by:   Gordy Savers  MD   Signed by:   Gordy Savers  MD on 10/30/2009   Method used:   Print then Give to [name]   RxID:   [id number] PROZAC 40 MG CAPS (FLUOXETINE HCL) 1 by mouth once daily  #90 x 4   Entered and Authorized by:   Gordy Savers  MD   Signed by:   Gordy Savers  MD on 10/30/2009   Method used:   Print then Give to [name]   RxID:   [id number] BUDEPRION XL 150 MG XR24H-TAB (BUPROPION HCL) 3 tablets daily  #270 x 4   Entered and Authorized by:   Gordy Savers  MD   Signed by:   Gordy Savers  MD on 10/30/2009   Method used:   Print then Give to [name]   RxID:   [id number]

## 2010-08-31 NOTE — Progress Notes (Signed)
Summary: Pt says antidepressant didnt work. She no longer needs it  Phone Note Call from Patient   Caller: Patient Summary of Call: Pt called and said that she is still waiting to her about PT.  Pt also said that anti-depressant samples did not work, so pt had her meds shipped to her for the depression and pain meds she had when she was in [place]. And they have refills on them.  Pt is destroying the new pain script that Dr. Amador Cunas prescribed, because she does not need it. The med is for lesser amount than she normally gets.  Initial call taken by: Lucy Antigua,  June 16, 2010 2:46 PM  Follow-up for Phone Call        PT consult for LBP Follow-up by: Gordy Savers  MD,  June 17, 2010 12:33 PM

## 2010-09-02 NOTE — Miscellaneous (Signed)
Summary: Initial Summary for PT Services/[facility]  Initial Summary for PT Services/[facility]   Imported By: Maryln Gottron 07/14/2010 11:07:49  _____________________________________________________________________  External Attachment:    Type:   Image     Comment:   External Document

## 2010-09-02 NOTE — Progress Notes (Signed)
Summary: wants kim to return call before noon  Phone Note Call from Patient Call back at Home Phone [phone]   Caller: Patient--live call Complaint: Urinary/GYN Problems Summary of Call: wants Selena Batten to return Selena Batten about a medication. wants call back asap. Initial call taken by: Warnell Forester,  August 25, 2010 10:05 AM  Follow-up for Phone Call        attempt to call - ans mach - LMTCB KIK Follow-up by: Duard Brady LPN,  August 25, 2010 12:05 PM  Additional Follow-up for Phone Call Additional follow up Details #1::        pt called back. please return call 1st thing in the morning. ( before 10 am). Additional Follow-up by: Warnell Forester,  August 25, 2010 4:50 PM    Additional Follow-up for Phone Call Additional follow up Details #2::    attempt to call - ans mach LMTCB Follow-up by: Duard Brady LPN,  August 27, 2010 10:15 AM

## 2010-09-02 NOTE — Assessment & Plan Note (Signed)
Summary: sinuses//ccm   Vital Signs:  Patient profile:   55 year old female Weight:      122 pounds BP sitting:   122 / 70  (right arm) Cuff size:   regular  Vitals Entered By: Duard Brady LPN (July 19, 2010 8:36 AM) CC: c/o back pain  Is Patient Diabetic? No   CC:  c/o back pain .  History of Present Illness:  55 year old patient who is seen today for follow-up.  She has a history of chronic low back pain.  She states that yesterday while moving in Christmas decorations from her attic she had a flare of her lumbar pain.  Pain is in the lumbar region.  The right greater than left.  No radicular symptoms.  She has signed a pain contract and does take a Vicodin p.r.n. low back pain.  She has a history depression.  Allergies (verified): No Known Drug Allergies  Past History:  Past Medical History: Reviewed history from 10/02/2008 and no changes required. Depression-bipolar Low back pain  Family History: Reviewed history from 10/09/2007 and no changes required. father died at 28, leukemia mother is 25, somewhat frail, but  in reasonably good health  Four sisters, one with a history of alcohol abuse  Review of Systems  The patient denies anorexia, fever, weight loss, weight gain, vision loss, decreased hearing, hoarseness, chest pain, syncope, dyspnea on exertion, peripheral edema, prolonged cough, headaches, hemoptysis, abdominal pain, melena, hematochezia, severe indigestion/heartburn, hematuria, incontinence, genital sores, muscle weakness, suspicious skin lesions, transient blindness, difficulty walking, depression, unusual weight change, abnormal bleeding, enlarged lymph nodes, angioedema, and breast masses.    Physical Exam  General:  Well-developed,well-nourished,in no acute distress; alert,appropriate and cooperative throughout examination Msk:  slight tenderness in the right lumbar musculature, especially on the right; the right lumbar musculature did feel  to be tight and tense.  Negative straight leg test Pulses:  R and L carotid,radial,femoral,dorsalis pedis and posterior tibial pulses are full and equal bilaterally Extremities:  No clubbing, cyanosis, edema, or deformity noted with normal full range of motion of all joints.     Impression & Recommendations:  Problem # 1:  BACK PAIN (ICD-724.5)  Her updated medication list for this problem includes:    Hydrocodone-acetaminophen 5-500 Mg Tabs (Hydrocodone-acetaminophen) ..... One every 6 hours as needed for pain    Ibuprofen 200 Mg Tabs (Ibuprofen) .Marland Kitchen... 2 tis    Cyclobenzaprine Hcl 5 Mg Tabs (Cyclobenzaprine hcl) ..... One every 8 hours as needed for pain  Her updated medication list for this problem includes:    Hydrocodone-acetaminophen 5-500 Mg Tabs (Hydrocodone-acetaminophen) ..... One every 6 hours as needed for pain    Ibuprofen 200 Mg Tabs (Ibuprofen) .Marland Kitchen... 2 tis    Cyclobenzaprine Hcl 5 Mg Tabs (Cyclobenzaprine hcl) ..... One every 8 hours as needed for pain  Problem # 2:  DEPRESSION (ICD-311)  Her updated medication list for this problem includes:    Budeprion Xl 150 Mg Xr24h-tab (Bupropion hcl) .Marland KitchenMarland KitchenMarland KitchenMarland Kitchen 3  tablets daily  Her updated medication list for this problem includes:    Budeprion Xl 150 Mg Xr24h-tab (Bupropion hcl) .Marland KitchenMarland KitchenMarland KitchenMarland Kitchen 3  tablets daily  Complete Medication List: 1)  Budeprion Xl 150 Mg Xr24h-tab (Bupropion hcl) .... 3  tablets daily 2)  Hydrocodone-acetaminophen 5-500 Mg Tabs (Hydrocodone-acetaminophen) .... One every 6 hours as needed for pain 3)  Retin-a 0.1 % Crea (Tretinoin) .... Use  at night 4)  Ibuprofen 200 Mg Tabs (Ibuprofen) .... 2 tis 5)  Zolpidem Tartrate  5 Mg Tabs (Zolpidem tartrate) .... One at bedtime as needed for sleep 6)  Cyclobenzaprine Hcl 5 Mg Tabs (Cyclobenzaprine hcl) .... One every 8 hours as needed for pain  Patient Instructions: 1)  Please schedule a follow-up appointment in 3 months. 2)  It is important that you exercise regularly at  least 20 minutes 5 times a week. If you develop chest pain, have severe difficulty breathing, or feel very tired , stop exercising immediately and seek medical attention. 3)  Most patients (90%) with low back pain will improve with time. Keep active but avoid activities that are painful. Apply moist heat and/or ice to lower back several times a day. Prescriptions: CYCLOBENZAPRINE HCL 5 MG TABS (CYCLOBENZAPRINE HCL) one every 8 hours as needed for pain  #30 x 2   Entered and Authorized by:   Gordy Savers  MD   Signed by:   Gordy Savers  MD on 07/19/2010   Method used:   Print then Give to Patient   RxID:   [id number] ZOLPIDEM TARTRATE 5 MG TABS (ZOLPIDEM TARTRATE) one at bedtime as needed for sleep  #30 x 2   Entered and Authorized by:   Gordy Savers  MD   Signed by:   Gordy Savers  MD on 07/19/2010   Method used:   Print then Give to Patient   RxID:   [id number] HYDROCODONE-ACETAMINOPHEN 5-500 MG TABS (HYDROCODONE-ACETAMINOPHEN) one every 6 hours as needed for pain  #90 x 2   Entered and Authorized by:   Gordy Savers  MD   Signed by:   Gordy Savers  MD on 07/19/2010   Method used:   Print then Give to Patient   RxID:   [id number] BUDEPRION XL 150 MG XR24H-TAB (BUPROPION HCL) 3  tablets daily  #270 x 6   Entered and Authorized by:   Gordy Savers  MD   Signed by:   Gordy Savers  MD on 07/19/2010   Method used:   Print then Give to Patient   RxID:   [id number]    Orders Added: 1)  Est. Patient Level III [75643]  Appended Document: sinuses//ccm     Allergies: No Known Drug Allergies   Complete Medication List: 1)  Budeprion Xl 150 Mg Xr24h-tab (Bupropion hcl) .... 3  tablets daily 2)  Hydrocodone-acetaminophen 5-500 Mg Tabs (Hydrocodone-acetaminophen) .... One every 6 hours as needed for pain 3)  Retin-a 0.1 % Crea (Tretinoin) .... Use  at night 4)  Ibuprofen 200 Mg Tabs (Ibuprofen) .... 2 tis 5)   Zolpidem Tartrate 5 Mg Tabs (Zolpidem tartrate) .... One at bedtime as needed for sleep 6)  Cyclobenzaprine Hcl 5 Mg Tabs (Cyclobenzaprine hcl) .... One every 8 hours as needed for pain  Other Orders: Depo- Medrol 40mg  (J1030) Admin of Therapeutic Inj  intramuscular or subcutaneous (32951)   Medication Administration  Injection # 1:    Medication: Depo- Medrol 40mg     Diagnosis: BACK PAIN (ICD-724.5)    Route: IM    Site: R deltoid    Exp Date: 10/2012    Lot #: [id number]    Mfr: Pharmacia    Patient tolerated injection without complications    Given by: Duard Brady LPN (July 19, 2010 11:39 AM)  Orders Added: 1)  Depo- Medrol 40mg  [J1030] 2)  Admin of Therapeutic Inj  intramuscular or subcutaneous [88416]

## 2010-09-02 NOTE — Assessment & Plan Note (Signed)
Summary: medication review   Vital Signs:  Patient profile:   55 year old female Weight:      121 pounds Temp:     98.1 degrees F oral BP sitting:   98 / 70  (right arm) Cuff size:   regular  Vitals Entered By: Duard Brady LPN (August 24, 2010 3:47 PM) CC: medication review Is Patient Diabetic? No   CC:  medication review.  History of Present Illness: 55 year old patient who is seen today for follow-up.  She has a history of chronic low back pain.  More recently she has had some cervical pain and has been evaluated by [facility] orthopedics.  She states that surgery was suggested that she wishes to avoid.  She is on chronic narcotic analgesics.  She has a history of depression.  With her new health plan.  She is having a more difficult time with her medications.  Allergies (verified): No Known Drug Allergies  Past History:  Past Medical History: Depression-bipolar Low back pain Neck Pain  Review of Systems  The patient denies anorexia, fever, weight loss, weight gain, vision loss, decreased hearing, hoarseness, chest pain, syncope, dyspnea on exertion, peripheral edema, prolonged cough, headaches, hemoptysis, abdominal pain, melena, hematochezia, severe indigestion/heartburn, hematuria, incontinence, genital sores, muscle weakness, suspicious skin lesions, transient blindness, difficulty walking, depression, unusual weight change, abnormal bleeding, enlarged lymph nodes, angioedema, and breast masses.    Physical Exam  General:  Well-developed,well-nourished,in no acute distress; alert,appropriate and cooperative throughout examination Head:  Normocephalic and atraumatic without obvious abnormalities. No apparent alopecia or balding. Mouth:  Oral mucosa and oropharynx without lesions or exudates.  Teeth in good repair. Neck:  decreased range of motion of the neck, especially with extension Lungs:  Normal respiratory effort, chest expands symmetrically. Lungs are clear  to auscultation, no crackles or wheezes. Heart:  Normal rate and regular rhythm. S1 and S2 normal without gallop, murmur, click, rub or other extra sounds. Msk:  negative straight leg test   Impression & Recommendations:  Problem # 1:  LOW BACK PAIN (ICD-724.2)  Her updated medication list for this problem includes:    Hydrocodone-acetaminophen 5-500 Mg Tabs (Hydrocodone-acetaminophen) ..... One every 6 hours as needed for pain    Ibuprofen 200 Mg Tabs (Ibuprofen) .Marland Kitchen... 2 tis    Cyclobenzaprine Hcl 5 Mg Tabs (Cyclobenzaprine hcl) ..... One every 8 hours as needed for pain  Her updated medication list for this problem includes:    Hydrocodone-acetaminophen 5-500 Mg Tabs (Hydrocodone-acetaminophen) ..... One every 6 hours as needed for pain    Ibuprofen 200 Mg Tabs (Ibuprofen) .Marland Kitchen... 2 tis    Cyclobenzaprine Hcl 5 Mg Tabs (Cyclobenzaprine hcl) ..... One every 8 hours as needed for pain  Problem # 2:  DEPRESSION (ICD-311)  Her updated medication list for this problem includes:    Budeprion Xl 150 Mg Xr24h-tab (Bupropion hcl) .Marland KitchenMarland KitchenMarland KitchenMarland Kitchen 3  tablets daily  Her updated medication list for this problem includes:    Budeprion Xl 150 Mg Xr24h-tab (Bupropion hcl) .Marland KitchenMarland KitchenMarland KitchenMarland Kitchen 3  tablets daily  Complete Medication List: 1)  Budeprion Xl 150 Mg Xr24h-tab (Bupropion hcl) .... 3  tablets daily 2)  Hydrocodone-acetaminophen 5-500 Mg Tabs (Hydrocodone-acetaminophen) .... One every 6 hours as needed for pain 3)  Retin-a 0.1 % Crea (Tretinoin) .... Use  at night 4)  Ibuprofen 200 Mg Tabs (Ibuprofen) .... 2 tis 5)  Zolpidem Tartrate 5 Mg Tabs (Zolpidem tartrate) .... One at bedtime as needed for sleep 6)  Cyclobenzaprine Hcl 5 Mg Tabs (  Cyclobenzaprine hcl) .... One every 8 hours as needed for pain  Patient Instructions: 1)  Please schedule a follow-up appointment in 4 months.   Orders Added: 1)  Est. Patient Level III [16109]

## 2010-09-10 ENCOUNTER — Encounter: Payer: Self-pay | Admitting: Internal Medicine

## 2010-09-13 NOTE — Progress Notes (Signed)
  Subjective:    Patient ID: Deborah [name], female    DOB: 12/21/1955, 55 y.o.   MRN: [id number]  HPI      Review of Systems     Objective:   Physical Exam        Assessment & Plan:   This encounter was created in error - please disregard.

## 2010-09-14 ENCOUNTER — Encounter: Payer: Self-pay | Admitting: Internal Medicine

## 2010-09-14 ENCOUNTER — Ambulatory Visit (INDEPENDENT_AMBULATORY_CARE_PROVIDER_SITE_OTHER): Payer: Medicare Other | Admitting: Internal Medicine

## 2010-09-14 DIAGNOSIS — F329 Major depressive disorder, single episode, unspecified: Secondary | ICD-10-CM

## 2010-09-14 DIAGNOSIS — M545 Low back pain, unspecified: Secondary | ICD-10-CM

## 2010-09-14 MED ORDER — HYDROCODONE-ACETAMINOPHEN 5-500 MG PO TABS: 1.0000 | ORAL_TABLET | Freq: Three times a day (TID) | ORAL | Status: DC | PRN

## 2010-09-14 NOTE — Progress Notes (Signed)
  Subjective:    Patient ID: Deborah [name], female    DOB: Mar 12, 1956, 55 y.o.   MRN: [id number]  HPI  55 year old patient who is seen today for follow-up.She is being followed by orthopedics for her cervical and lumbar pain.  She is scheduled for the MRI in the near future and is to be considered for spinal epidurals.  She is seen today for follow-up of her depression.  This is unchanged, but reasonably stable.  She has been on Prozac, as well as atypicals in the past    Review of Systems  Constitutional: Negative.   HENT: Negative for hearing loss, congestion, sore throat, rhinorrhea, dental problem, sinus pressure and tinnitus.   Eyes: Negative for pain, discharge and visual disturbance.  Respiratory: Negative for cough and shortness of breath.   Cardiovascular: Negative for chest pain, palpitations and leg swelling.  Gastrointestinal: Negative for nausea, vomiting, abdominal pain, diarrhea, constipation, blood in stool and abdominal distention.  Genitourinary: Negative for dysuria, urgency, frequency, hematuria, flank pain, vaginal bleeding, vaginal discharge, difficulty urinating, vaginal pain and pelvic pain.  Musculoskeletal: Negative for joint swelling, arthralgias and gait problem.  Skin: Negative for rash.  Neurological: Negative for dizziness, syncope, speech difficulty, weakness, numbness and headaches.  Hematological: Negative for adenopathy.  Psychiatric/Behavioral: Positive for dysphoric mood. Negative for behavioral problems and agitation. The patient is not nervous/anxious.        Objective:   Physical Exam  Constitutional: She appears well-developed and well-nourished. No distress.  Psychiatric: She has a normal mood and affect. Her behavior is normal. Judgment and thought content normal.          Assessment & Plan:  Chronic low back pain-  Follow-up orthopedics.  Analgesics.  Refill Depression-  Will continue present regimen.  Consider psychiatric referral  if any clinical worsening or need for further medication

## 2010-09-14 NOTE — Patient Instructions (Signed)
Orthopedic follow-up as scheduled  Return in 3 months for follow-up  Call or return to clinic prn if these symptoms worsen or fail to improve as anticipated.

## 2010-09-17 ENCOUNTER — Telehealth: Payer: Self-pay

## 2010-09-17 NOTE — Telephone Encounter (Signed)
Attempted to call pt - ans mach - LMTCB - gave instructions per Dr. Amador Cunas - ok to take q6hr , but monthly quanity will stand. When turn in to pharmacy - if they have questions , have them call and I will verify. KIK

## 2010-09-17 NOTE — Telephone Encounter (Signed)
Patient may take her pain medication every 6 hours, however, her Monthly Quantity will be limited as per the prescription

## 2010-09-17 NOTE — Telephone Encounter (Signed)
Pain med has 2 different directions on it - take q8hr and also take q6hrs prn - please clarify. She had discussed increasing med at visit , so she hopes q6hrs is correct. KIK  Please advise

## 2010-09-28 ENCOUNTER — Telehealth: Payer: Self-pay | Admitting: Internal Medicine

## 2010-09-28 NOTE — Telephone Encounter (Signed)
Pt needs a refill on med: ambien / sleep aid medicine per pt..... [facility].... Pts # [id number].

## 2010-10-08 ENCOUNTER — Telehealth: Payer: Self-pay

## 2010-10-08 NOTE — Telephone Encounter (Signed)
Please send letter discharging patient from the practice for violation of the pain contract

## 2010-10-08 NOTE — Telephone Encounter (Signed)
recv'd call from ortho office - ashley , pt was seen and given rx for vicodin , pharmacy alerted them about recent rx with refill given fill. Ashley instructed pharmacy not to fill  And then called [place]. Info forwarded to Dr. Amador Cunas to inform. KIK  rx given 09-14-2010 #90 with 2 RF at rov. KIK

## 2010-10-13 ENCOUNTER — Telehealth: Payer: Self-pay | Admitting: Internal Medicine

## 2010-10-13 NOTE — Telephone Encounter (Signed)
Wants Deborah [name] to return. She did request the rx that Deborah [name] ortho sent. rx is still at [facility].

## 2010-10-13 NOTE — Telephone Encounter (Signed)
PT HAS BEEN DISCHARGED FROM PRACTICE. KIK

## 2010-10-14 ENCOUNTER — Encounter: Payer: Self-pay | Admitting: *Deleted

## 2010-10-14 NOTE — Telephone Encounter (Signed)
Discharge letter sent.

## 2010-11-08 ENCOUNTER — Other Ambulatory Visit: Payer: Self-pay | Admitting: Internal Medicine

## 2010-11-08 MED ORDER — ZOLPIDEM TARTRATE 5 MG PO TABS: 5.0000 mg | ORAL_TABLET | Freq: Every evening | ORAL | Status: DC | PRN

## 2010-11-08 NOTE — Telephone Encounter (Signed)
Received letter and the patient does not agree with it. In the meantime, she will need a refill of Ambien sent to [facility] until she finds another doctor. Wants Kim to return call.

## 2010-11-08 NOTE — Telephone Encounter (Signed)
Called into [facility] - david.  Attempt to call pt - VM - left msg about 30 day supply called in. KIK

## 2010-11-08 NOTE — Telephone Encounter (Signed)
All meds must be filled by another practice

## 2010-11-08 NOTE — Telephone Encounter (Signed)
Pt has recv'd discharge letter - dated 3/15 . Requesting refill on ambien.  Last written 07/19/10 #30 RF2 Please advise
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
## 2010-12-17 NOTE — H&P (Signed)
NAMEAUGUSTA, MIRKIN          ACCOUNT NO.:  [id number]   MEDICAL RECORD NO.:  [id number]          PATIENT TYPE:  AMB   LOCATION:  SDC                           FACILITY:  [facility]   PHYSICIAN:  Osborn Coho, M.D.   DATE OF BIRTH:  01/04/1956   DATE OF ADMISSION:  DATE OF DISCHARGE:                              HISTORY & PHYSICAL   ADDENDUM:   CURRENT MEDICATIONS:  1. Wellbutrin 300 mg daily.  2. Seroquel daily.  3. Requip daily.  4. Trazodone as needed.   In addition to the other medicines I previously mentioned.   DIAGNOSIS:  Incarcerated right femoral hernia, right inguinal hernia.   IMPRESSION:  Incarcerated right femoral hernia.      Elmira J. Adline Peals.      Osborn Coho, M.D.  Electronically Signed    EJP/MEDQ  D:  08/07/2006  T:  08/07/2006  Job:  [id number]

## 2010-12-17 NOTE — Op Note (Signed)
Deborah [name], Deborah [name]          ACCOUNT NO.:  [id number]   MEDICAL RECORD NO.:  [id number]          PATIENT TYPE:  AMB   LOCATION:  SDC                           [facility]ACILITY:  [facility]   PHYSICIAN:  Leonie Man, M.D.   DATE OF BIRTH:  1956-06-16   DATE OF PROCEDURE:  08/09/2006  DATE OF DISCHARGE:                               OPERATIVE REPORT   PREOPERATIVE DIAGNOSIS:  Right femoral hernia.   POSTOPERATIVE DIAGNOSIS:  Right femoral hernia.   PROCEDURE:  Repair of right femoral hernia with mesh.   SURGEON:  Leonie Man, M.D.   ASSISTANT:  OR PA.   ANESTHESIA:  General.   SPECIMENS TO LAB:  Hernia sac.   COMPLICATIONS:  None.   INDICATIONS:  The patient is a 55 year old female with symptomatic right-  sided groin pain and bulge.  This on examination is not reducible and  comes directly out of the femoral canal.  She comes to the operating  room after the risks and potential benefits of surgery have been  discussed, all questions answered, and consent obtained.   PROCEDURE:  The patient is positioned supinely following the induction  of satisfactory general endotracheal anesthesia and the lower abdomen  and groin are prepped and draped to be included in the sterile operative  field.  A transverse incision is carried down through the skin and  subcutaneous tissues, carried lateral to the inguinal ligament and down  to the femoral canal, where a hernia sac is then located, dissected free  carrying the dissection all the way down to the defect.  The sac is  fully mobilized and suture ligated with a 2-0 Novofil.  The redundant  sac is amputated and the stump allowed to retract into the  retroperitoneal area.  I then formed then a patch of polypropylene mesh  and placed it into the defect and suturing it to the inguinal ligament  and to the pecten pubis and to the anterior femoral sheath with a  running suture of 2-0 Novofil.  I subsequently closed the entire canal  with a  single suture of 2-0 Novofil.  There was no noted compromise to  any of the vessels within the femoral canal.  Sponge, needle and  instrument counts were verified.  Scarpa fascia and the subcutaneous  tissue was closed with a  running 3-0 Vicryl, skin closed with running 4-0 Monocryl and reinforced  with Steri-Strips.  Sterile dressing applied.  Anesthetic reversed.  The  patient removed from the operating room to the recovery room in stable  condition.  She tolerated the procedure well.      Leonie Man, M.D.  Electronically Signed     PB/MEDQ  D:  08/09/2006  T:  08/09/2006  Job:  [id number]   cc:   Osborn Coho, M.D.  Fax: [phone]

## 2010-12-17 NOTE — Discharge Summary (Signed)
Deborah [name], Deborah [name]          ACCOUNT NO.:  [id number]   MEDICAL RECORD NO.:  [id number]          PATIENT TYPE:  INP   LOCATION:  9304                          [facility]ACILITY:  [facility]   PHYSICIAN:  Osborn Coho, M.D.   DATE OF BIRTH:  23-Jun-1956   DATE OF ADMISSION:  08/09/2006  DATE OF DISCHARGE:  08/11/2006                               DISCHARGE SUMMARY   DISCHARGE DIAGNOSES:  1. Symptomatic cystocele and rectocele.  2. Right femoral hernia.   OPERATION:  On the day of admission, the patient underwent an  anterior/posterior colporrhaphy along with repair of the right femoral  hernia, tolerating procedures well.   HISTORY OF PRESENT ILLNESS:  Deborah [name] is a 55 year old single white  menopausal female, gravida 1, para 0, who presents for anterior and  posterior colporrhaphy along with repair of a right femoral hernia  because of a symptomatic pelvic relaxation and right groin bulge.  Please see the patient's dictated history and physical examination for  details.   PREOPERATIVE PHYSICAL EXAM:  Blood pressure was 100/60.  Weight is 117.  Height is 5-feet 5-inches tall.  General exam is within normal limits.  PELVIC:  On exam of EG/BU, the patient does have a tender palpable bulge  in her right groin area.  Her vagina is atrophic with a second- to third-  degree cystocele.  Her cervix is nontender without lesions.  Uterus  appears normal size, shape and consistency without tenderness.  Adnexa  without tenderness or masses and there is a first-degree rectocele.   HOSPITAL COURSE:  On the date of admission, the patient underwent  aforementioned procedures, tolerating them all well (the patient's right  femoral hernia repair was performed by Dr. Leonie Man).  The  patient's postoperative course was marked by a temperature elevation of  100.5 degrees Fahrenheit on postop day #1; however, she quickly  defervesced and tolerated her 10.8 hemoglobin without difficulties  (preop  hemoglobin was 12.4).  By postop day #2, the patient had resumed  bowel and bladder function and was therefore deemed ready for discharge  home.   DISCHARGE MEDICATIONS:  The patient was directed to the home medication  reconciliation sheet.  She was given:  1. Hydrocodone/APAP 5/500 one to two every 4 hours as needed for pain.  2. Colace 100 mg three times daily until her bowel movements are      regular.  3. Ibuprofen 600 mg with food every 6 hours for 2 days, then as needed      for pain.  4. Premarin vaginal cream to use as directed.  5. Keflex 500 mg four times daily for 7 days.   FOLLOWUP:  The patient has been asked to call [facility] to  schedule a 6-week postoperative visit with Dr. Su Hilt.  She is to call  Dr. Danella Penton office in 3 weeks at [phone] to schedule a followup exam.   DISCHARGE INSTRUCTIONS:  The patient was advised to call for any  temperature greater than 100.4 degrees Fahrenheit, any heavy vaginal  bleeding, uncontrolled pain or any questions.  She was further advised  to avoid driving while taking  narcotic pain medication, to avoid lifting  over 10 pounds for 2 weeks, to avoid sexual activity  for 6 weeks, that she may shower, she may walk up steps.  The patient's  diet was without restrictions, though she was encouraged to increase her  protein and vitamin C intake.   FINAL PATHOLOGY:  Right hernia sac:  Findings consistent with hernia  sac.      Deborah [name].      Osborn Coho, M.D.  Electronically Signed    EJP/MEDQ  D:  08/23/2006  T:  08/23/2006  Job:  [id number]

## 2010-12-17 NOTE — Op Note (Signed)
[name], Deborah [name]          ACCOUNT NO.:  [id number]   MEDICAL RECORD NO.:  [id number]          PATIENT TYPE:  INP   LOCATION:  9304                          [facility]ACILITY[facility]   PHYSICIAN:  Osborn Coho, M.D.   DATE OF BIRTH:  Sep 13, 1955   DATE OF PROCEDURE:  08/09/2006  DATE OF DISCHARGE:  08/11/2006                               OPERATIVE REPORT   PREOPERATIVE DIAGNOSES:  1. Symptomatic cystocele and rectocele.  2. Right femoral hernia.   POSTOPERATIVE DIAGNOSES:  1. Symptomatic cystocele and rectocele.  2. Right femoral hernia.   PROCEDURE:  1. Anterior and posterior repair.  2. Hernia repair performed by Dr. Lurene Shadow.   ATTENDING:  Dr. Osborn Coho.   ASSISTANT:  Henreitta Leber.   ANESTHESIA:  General.   SPECIMENS TO PATHOLOGY:  Hernia sac.   FLUIDS:  2200 mL.   URINE OUTPUT:  200 mL.   ESTIMATED BLOOD LOSS:  50 mL.   COMPLICATIONS:  None.   FINDINGS:  Right femoral hernia, cystocele and rectocele.  There was  uterine prolapse as well with was unable to be elicited on exam in the  office.   DESCRIPTION OF PROCEDURE:  The patient was taken to the operating room  after the risks, benefits, and alternatives were reviewed with the  patient.  The patient verbalized understanding and consent signed and  witnessed.  The patient was placed under general anesthesia and prepped  and draped in the normal sterile fashion in the dorsal supine position.  Dr. Lurene Shadow then performed hernia repair and please see his dictated note  for that portion of the procedure.  After hernia repair, the patient was  reprepped and draped in the dorsal lithotomy position and a weighted  speculum was placed in the patient's vagina.  Dilute Pitressin 20 units  of Pitressin and 100 mL of normal saline was placed in the anterior  vaginal wall, approximately 10 mL was used.  The anterior vaginal wall  was incised and cystocele repaired with Kelly plication stitches.  This  was done after  the cystocele was dissected away from the vaginal wall  mucosa.  The vaginal wall mucosa was then repaired with 2-0 Vicryl via  interrupted stitches.  The Kelly plication stitches were placed with 3-0  Vicryl.  Attention was then turned to the posterior vaginal wall and  vaginal wall retractors placed anteriorly and laterally and a total of  10 mL of dilute Pitressin placed in the posterior vaginal wall.  The  rectocele was dissected away from the posterior vaginal wall mucosa and  rectocele was repaired.  Plication stitches of 3-0 Vicryl were used to  perform the repair. The posterior vaginal wall mucosa was repaired using  2-0 Vicryl via a running interlocking stitch to the level of the vaginal  introitus.  #0 Vicryl was placed in the subcutaneous tissue in order to  reinforce the perineal body and the remainder of the vaginal wall mucosa  and perineum was repaired with 2-0 Vicryl.  The patient was packed with  2-inch plain estrogen soaked packing.  Sponge, lap and needle count was  correct.  The patient tolerated  the procedure well and was returned to  the recovery room in good condition.   ADDENDUM:      Osborn Coho, M.D.  Electronically Signed     AR/MEDQ  D:  08/14/2006  T:  08/14/2006  Job:  [id number]

## 2010-12-17 NOTE — H&P (Signed)
NAME:  Deborah [name], Deborah [name]          ACCOUNT NO.:  [id number]   MEDICAL RECORD NO.:  [id number]          PATIENT TYPE:  AMB   LOCATION:  SDC                           FACILITY:  [facility]   PHYSICIAN:  Osborn Coho, M.D.   DATE OF BIRTH:  1955/12/28   DATE OF ADMISSION:  DATE OF DISCHARGE:                              HISTORY & PHYSICAL   HISTORY OF PRESENT ILLNESS:  [name] is a 55 year old single white  menopausal female, gravida 1 para 0, who presents for anterior  colporrhaphy and repair of a right inguinal hernia because of a  symptomatic cystocele and pelvic pain.  For the past several months, the  patient has noticed what she thought to be an enlarged lymph node in her  right groin area.  This area was tender to touch and uncomfortable with  friction from her clothing.  The patient was sent to a general surgeon  and diagnosed with a right inguinal hernia.  More recently, the patient  presented with the sensation as if her bottom was falling out.  On  several occasions, she observed a bulge at her vaginal opening.  She  admits to pelvic pain, urinary urgency, and nocturia, but denies any  fever, vaginitis symptoms, nausea, vomiting, diarrhea, urinary  frequency, incontinence, or hematuria.  A pelvic ultrasound in December  2007 was within normal limits.  Urine gonorrhea and Chlamydia cultures  done at that same time were all negative.  The patient's complete blood  count during this period has been essentially normal.  Since the patient  has had considerable discomfort from both of her conditions, though she  has found some relief with Vicodin, she has consented to proceed with  surgical correction of them both.   PAST MEDICAL HISTORY:   OB HISTORY:  Gravida 1, para 0-0-1-0.   GYN HISTORY:  Menarche at 55 years old.  The patient is menopausal.  Denies any history of sexually transmitted diseases or abnormal Pap  smears.  Last normal Pap smear was November 2007.   MEDICAL  HISTORY:  Positive for depression   SURGICAL HISTORY:  Negative.   FAMILY HISTORY:  Negative.   SOCIAL HISTORY:  The patient is single, and she works for [employer].   HABITS:  The patient does not use alcohol.  She does smoke cigarettes  occasionally.   MEDICATIONS:  1. Wellbutrin 300 mg daily.  2. Calcium daily.  3. Vicodin 1-2 tablets q.4 h. as needed for pain.   She has no known drug allergies.   REVIEW OF SYSTEMS:  The patient has occasional right leg and right-sided  neck pain due to stress.  She denies any chest pain, shortness of  breath, headache, vision changes, and except as mentioned in history of  present illness, her review of systems is negative.   PHYSICAL EXAMINATION:  VITAL SIGNS:  Blood pressure 100/60, weight is  117, height is 5 feet 5 inches tall.  NECK:  Supple, without masses.  There is no thyromegaly or cervical  adenopathy.  HEART:  Regular rate and rhythm.  There is no murmur.  LUNGS:  Clear to auscultation.  BACK:  No CVA tenderness.  ABDOMEN:  Reveals diffuse tenderness, with voluntary guarding, but no  rebound or organomegaly.  EXTREMITIES:  No clubbing, cyanosis, or edema.  PELVIC:  EG/BUS:  The patient does have a tender, palpable bulge in her  right groin area.  Vagina is atrophic, with a 2-3 degree cystocele.  Cervix is nontender, without lesions.  Uterus normal size, shape, and  consistency, without tenderness.  Adnexa:  No masses or tenderness.  There is a first-degree rectocele.   IMPRESSION:  1. Symptomatic cystocele.  2. Right inguinal hernia.  3. Pelvic pain.   DISPOSITION:  A discussion was held with the patient regarding the  indications for her procedures, along with their risks, which include  but are not limited to reaction to anesthesia, damage to adjacent  organs, infection, and excessive bleeding.  The patient has consented to  proceed with the repair of a right inguinal hernia along with an  anterior and possibly  posterior colporrhaphy at [facility] of  [place] on August 09, 2006 at 12 noon.      [place]mira J. Adline Peals.      Osborn Coho, M.D.  Electronically Signed    EJP/MEDQ  D:  08/07/2006  T:  08/07/2006  Job:  [id number]
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
## 2012-04-21 DIAGNOSIS — F339 Major depressive disorder, recurrent, unspecified: Secondary | ICD-10-CM | POA: Insufficient documentation

## 2012-05-02 DIAGNOSIS — M5030 Other cervical disc degeneration, unspecified cervical region: Secondary | ICD-10-CM | POA: Insufficient documentation

## 2013-03-02 ENCOUNTER — Emergency Department (INDEPENDENT_AMBULATORY_CARE_PROVIDER_SITE_OTHER)
Admission: EM | Admit: 2013-03-02 | Discharge: 2013-03-02 | Disposition: A | Discharge status: Home / Self Care | Payer: Medicare Other | Source: Home / Self Care | Attending: Family Medicine | Admitting: Family Medicine

## 2013-03-02 ENCOUNTER — Encounter (HOSPITAL_COMMUNITY): Payer: Self-pay | Admitting: Emergency Medicine

## 2013-03-02 ENCOUNTER — Emergency Department (INDEPENDENT_AMBULATORY_CARE_PROVIDER_SITE_OTHER): Payer: Medicare Other

## 2013-03-02 DIAGNOSIS — S82409A Unspecified fracture of shaft of unspecified fibula, initial encounter for closed fracture: Secondary | ICD-10-CM

## 2013-03-02 DIAGNOSIS — S82401A Unspecified fracture of shaft of right fibula, initial encounter for closed fracture: Secondary | ICD-10-CM

## 2013-03-02 MED ORDER — HYDROCODONE-ACETAMINOPHEN 5-325 MG PO TABS: 1.0000 | ORAL_TABLET | Freq: Four times a day (QID) | ORAL | Status: DC | PRN

## 2013-03-02 NOTE — ED Provider Notes (Signed)
CSN: [id number]     Arrival date & time 03/02/13  1001 History     First MD Initiated Contact with Patient 03/02/13 1026     Chief Complaint  Patient presents with  . Ankle Pain   (Consider location/radiation/quality/duration/timing/severity/associated sxs/prior Treatment) Patient is a 57 y.o. female presenting with ankle pain. The history is provided by the patient.  Ankle Pain Location:  Ankle Time since incident:  6 hours Injury: yes   Mechanism of injury comment:  Larey Seat out of bed twisting ankle this am. Ankle location:  R ankle Pain details:    Quality:  Sharp   Radiates to:  Does not radiate   Severity:  Moderate Chronicity:  New Dislocation: no   Foreign body present:  No foreign bodies   Past Medical History  Diagnosis Date  . Depression   . Low back pain   . Neck pain    History reviewed. No pertinent past surgical history. Family History  Problem Relation Age of Onset  . Leukemia Father   . Alcohol abuse Sister    History  Substance Use Topics  . Smoking status: Former Smoker    Quit date: 08/01/2009  . Smokeless tobacco: Not on file  . Alcohol Use: Not on file   OB History   Grav Para Term Preterm Abortions TAB SAB Ect Mult Living                 Review of Systems  Constitutional: Negative.   Musculoskeletal: Positive for joint swelling and gait problem.  Skin: Negative.     Allergies  Review of patient's allergies indicates no known allergies.  Home Medications   Current Outpatient Rx  Name  Route  Sig  Dispense  Refill  . buPROPion (WELLBUTRIN XL) 150 MG 24 hr tablet   Oral   Take 150 mg by mouth daily. 3 tablets daily          . cyclobenzaprine (FLEXERIL) 5 MG tablet   Oral   Take 5 mg by mouth every 8 (eight) hours as needed. One every 8 hours as needed for pain          . HYDROcodone-acetaminophen (NORCO/VICODIN) 5-325 MG per tablet   Oral   Take 1 tablet by mouth every 6 (six) hours as needed for pain.   20 tablet   0    . HYDROcodone-acetaminophen (VICODIN) 5-500 MG per tablet   Oral   Take 1 tablet by mouth every 8 (eight) hours as needed. One every 6 hours as needed for pain   90 tablet   3   . ibuprofen (ADVIL,MOTRIN) 200 MG tablet   Oral   Take 200 mg by mouth.           . tretinoin (RETIN-A) 0.1 % cream   Topical   Apply topically at bedtime.           Marland Kitchen zolpidem (AMBIEN) 5 MG tablet   Oral   Take 1 tablet (5 mg total) by mouth at bedtime as needed. One at bedtime as needed for sleep   30 tablet   0    BP 119/81  Pulse 97  Temp(Src) 98.9 F (37.2 C) (Oral)  Resp 16  SpO2 100% Physical Exam  Nursing note and vitals reviewed. Constitutional: She is oriented to person, place, and time. She appears well-developed and well-nourished.  Musculoskeletal: She exhibits tenderness.       Right ankle: She exhibits decreased range of motion and swelling. She exhibits  no deformity and normal pulse. Tenderness. Lateral malleolus and AITFL tenderness found. No head of 5th metatarsal and no proximal fibula tenderness found.  Neurological: She is alert and oriented to person, place, and time.  Skin: Skin is warm and dry.    ED Course   Procedures (including critical care time)  Labs Reviewed - No data to display Dg Ankle Complete Right  03/02/2013   *RADIOLOGY REPORT*  Clinical Data: Larey Seat out of bed this morning and sustained a twisting injury to the right ankle.  RIGHT ANKLE - COMPLETE 3+ VIEW  Comparison: None.  Findings: Comminuted, mildly displaced obliquely oriented fracture involving the distal fibula above the level of the malleolus. Ankle mortise intact with well-preserved joint space.  No other fractures.  Well-preserved bone mineral density.  Lateral and dorsal soft tissue swelling and small joint effusion/hemarthrosis.  IMPRESSION: Comminuted, mildly displaced obliquely oriented fracture of the distal fibula.   Original Report Authenticated By: Hulan Saas, M.D.   1. Fracture,  fibula closed, shaft, right, initial encounter     MDM  X-rays reviewed and report per radiologist.   Linna Hoff, MD 03/02/13 1055

## 2013-03-02 NOTE — ED Notes (Signed)
States she was changing sides while in the bed and fell off bed and hurt right ankle this morning.   No treatment done.  OTC medication taken but no relief.

## 2013-03-03 ENCOUNTER — Emergency Department (HOSPITAL_COMMUNITY)
Admission: EM | Admit: 2013-03-03 | Discharge: 2013-03-03 | Disposition: A | Discharge status: Home / Self Care | Payer: Medicare Other | Attending: Emergency Medicine | Admitting: Emergency Medicine

## 2013-03-03 DIAGNOSIS — M79609 Pain in unspecified limb: Secondary | ICD-10-CM | POA: Insufficient documentation

## 2013-03-03 DIAGNOSIS — M7989 Other specified soft tissue disorders: Secondary | ICD-10-CM | POA: Insufficient documentation

## 2013-03-03 DIAGNOSIS — G8911 Acute pain due to trauma: Secondary | ICD-10-CM | POA: Insufficient documentation

## 2013-03-03 DIAGNOSIS — F3289 Other specified depressive episodes: Secondary | ICD-10-CM | POA: Insufficient documentation

## 2013-03-03 DIAGNOSIS — M25569 Pain in unspecified knee: Secondary | ICD-10-CM | POA: Insufficient documentation

## 2013-03-03 DIAGNOSIS — Z79899 Other long term (current) drug therapy: Secondary | ICD-10-CM | POA: Insufficient documentation

## 2013-03-03 DIAGNOSIS — Z87891 Personal history of nicotine dependence: Secondary | ICD-10-CM | POA: Insufficient documentation

## 2013-03-03 DIAGNOSIS — S82401S Unspecified fracture of shaft of right fibula, sequela: Secondary | ICD-10-CM

## 2013-03-03 DIAGNOSIS — F329 Major depressive disorder, single episode, unspecified: Secondary | ICD-10-CM | POA: Insufficient documentation

## 2013-03-03 MED ORDER — OXYCODONE-ACETAMINOPHEN 5-325 MG PO TABS
2.0000 | ORAL_TABLET | Freq: Once | ORAL | Status: AC
  Administered 2013-03-03: 2 via ORAL
  Filled 2013-03-03: qty 2

## 2013-03-03 MED ORDER — OXYCODONE-ACETAMINOPHEN 10-325 MG PO TABS: 1.0000 | ORAL_TABLET | ORAL | Status: DC | PRN

## 2013-03-03 NOTE — Progress Notes (Signed)
Orthopedic Tech Progress Note Patient Details:  Deborah [name] 20-Oct-1955 [id number]  Ortho Devices Type of Ortho Device: Ace wrap;Crutches Ortho Device/Splint Location: right ankle Ortho Device/Splint Interventions: Application Viewed order from doctor's order list  Nikki Dom 03/03/2013, 9:19 PM

## 2013-03-03 NOTE — ED Provider Notes (Signed)
CSN: [id number]     Arrival date & time 03/03/13  1951 History  This chart was scribed for non-physician practitioner Arthor Captain, PA-C, working with No att. providers found, by Yevette Edwards, ED Scribe. This patient was seen in room TR08C/TR08C and the patient's care was started at 8:10 PM.   None    Chief Complaint  Patient presents with  . Leg Pain    HPI HPI Comment Deborah [name] is a 57 y.o. female who presents to the Emergency Department complaining of a fractured right fibula which occurred yesterday. She was treated by Dr. Penni Bombard at Urgent Care for the fractured fibula and discharged yesterday. The pt returns to the ED today reporting that she is now experiencing swelling and discoloration to her right foot. She denies experiencing any numbness or tingling to her right toes. She reports that the pain increases when her foot is palpated or twisted. The pt has been treating her symptoms with 10 hydrocodone and icing, but with little resolution.  She admits that she has not rested the foot as well as she should have. She states that she has a h/o taking hydrocodone for back pain.   Past Medical History  Diagnosis Date  . Depression   . Low back pain   . Neck pain    No past surgical history on file. Family History  Problem Relation Age of Onset  . Leukemia Father   . Alcohol abuse Sister    History  Substance Use Topics  . Smoking status: Former Smoker    Quit date: 08/01/2009  . Smokeless tobacco: Not on file  . Alcohol Use: Not on file   No OB history provided.  Review of Systems  Constitutional: Negative for fever and chills.  Musculoskeletal: Positive for arthralgias (Right fibula ).  Skin: Negative for pallor.  Neurological: Negative for numbness.    Allergies  Review of patient's allergies indicates no known allergies.  Home Medications   Current Outpatient Rx  Name  Route  Sig  Dispense  Refill  . buPROPion (WELLBUTRIN XL) 150 MG 24 hr tablet    Oral   Take 150 mg by mouth daily. 3 tablets daily          . cyclobenzaprine (FLEXERIL) 5 MG tablet   Oral   Take 5 mg by mouth every 8 (eight) hours as needed. One every 8 hours as needed for pain          . HYDROcodone-acetaminophen (NORCO/VICODIN) 5-325 MG per tablet   Oral   Take 1 tablet by mouth every 6 (six) hours as needed for pain.   20 tablet   0   . HYDROcodone-acetaminophen (VICODIN) 5-500 MG per tablet   Oral   Take 1 tablet by mouth every 8 (eight) hours as needed. One every 6 hours as needed for pain   90 tablet   3   . ibuprofen (ADVIL,MOTRIN) 200 MG tablet   Oral   Take 200 mg by mouth.           . tretinoin (RETIN-A) 0.1 % cream   Topical   Apply topically at bedtime.           Marland Kitchen zolpidem (AMBIEN) 5 MG tablet   Oral   Take 1 tablet (5 mg total) by mouth at bedtime as needed. One at bedtime as needed for sleep   30 tablet   0    Triage Vitals: BP 134/70  Pulse 101  Temp(Src) 98.2 F (36.8  C) (Oral)  Resp 16  SpO2 99%  Physical Exam  Nursing note and vitals reviewed. Constitutional: She is oriented to person, place, and time. She appears well-developed and well-nourished. No distress.  HENT:  Head: Normocephalic and atraumatic.  Eyes: EOM are normal.  Neck: Neck supple. No tracheal deviation present.  Cardiovascular: Normal rate.   Pulmonary/Chest: Effort normal. No respiratory distress.  Musculoskeletal: Normal range of motion. She exhibits tenderness.  Palpable pulses to feet bilaterally. Cap refill is less than three seconds. Swelling and ecchymosis present to right foot. Tenderness to palpation. No pallor. No pain out of proportion to examination.   Neurological: She is alert and oriented to person, place, and time.  Skin: Skin is warm and dry.  Psychiatric: She has a normal mood and affect. Her behavior is normal.    ED Course   DIAGNOSTIC STUDIES:  Oxygen Saturation is 99% on room air, normal by my interpretation.     COORDINATION OF CARE:  8:14 PM- Informed the pt that the bruising and swelling she is currently experiencing is typical of the fracture. Educated pt about compartment syndrome and malunion. Reminded the pt to continue to ice, elevate, and rest her foot. Told pt that she would be prescribed a different pain medication. Advised pt to follow up with an orthopedist. The pt agreed.     Procedures (including critical care time)  Labs Reviewed - No data to display Dg Ankle Complete Right  03/02/2013   *RADIOLOGY REPORT*  Clinical Data: Larey Seat out of bed this morning and sustained a twisting injury to the right ankle.  RIGHT ANKLE - COMPLETE 3+ VIEW  Comparison: None.  Findings: Comminuted, mildly displaced obliquely oriented fracture involving the distal fibula above the level of the malleolus. Ankle mortise intact with well-preserved joint space.  No other fractures.  Well-preserved bone mineral density.  Lateral and dorsal soft tissue swelling and small joint effusion/hemarthrosis.  IMPRESSION: Comminuted, mildly displaced obliquely oriented fracture of the distal fibula.   Original Report Authenticated By: Hulan Saas, M.D.   1. Fracture, fibula closed, shaft, right, sequela     MDM   Filed Vitals:   03/03/13 2003  BP: 134/70  Pulse: 101  Temp: 98.2 F (36.8 C)  Resp: 16   Patient concerned with discoloration of foot. There are no signs of compartment syndrome. She does have invreased swelling in the foot and dependent ecchymosis. Toes are warm and pink with cap refill<3sec I will gently wrap the patient's ankle with ace wrap to provide compression. Patient will be given crutches and advised to avoid weight bearing. Change in pain meds. F/u with ortho.   I personally performed the services described in this documentation, which was scribed in my presence. The recorded information has been reviewed and is accurate.      Arthor Captain, PA-C 03/04/13 1909

## 2013-03-03 NOTE — ED Notes (Signed)
Pt states she broke her fibula yesterday and was seen at urgent care. C/o increased pain and darkening skin.

## 2013-03-05 NOTE — ED Provider Notes (Signed)
Medical screening examination/treatment/procedure(s) were performed by non-physician practitioner and as supervising physician I was immediately available for consultation/collaboration.    Christopher J. Pollina, MD 03/05/13 0720 

## 2013-11-06 ENCOUNTER — Encounter: Payer: Self-pay | Admitting: Nurse Practitioner

## 2013-11-22 ENCOUNTER — Encounter: Payer: Self-pay | Admitting: *Deleted

## 2013-12-02 ENCOUNTER — Encounter: Payer: Medicare Other | Admitting: Nurse Practitioner

## 2014-09-26 ENCOUNTER — Emergency Department (INDEPENDENT_AMBULATORY_CARE_PROVIDER_SITE_OTHER)
Admission: EM | Admit: 2014-09-26 | Discharge: 2014-09-26 | Disposition: A | Discharge status: Home / Self Care | Payer: Medicare Other | Source: Home / Self Care | Attending: Emergency Medicine | Admitting: Emergency Medicine

## 2014-09-26 ENCOUNTER — Encounter (HOSPITAL_COMMUNITY): Payer: Self-pay | Admitting: Emergency Medicine

## 2014-09-26 DIAGNOSIS — M549 Dorsalgia, unspecified: Secondary | ICD-10-CM

## 2014-09-26 DIAGNOSIS — G8929 Other chronic pain: Secondary | ICD-10-CM | POA: Diagnosis not present

## 2014-09-26 DIAGNOSIS — M545 Low back pain, unspecified: Secondary | ICD-10-CM

## 2014-09-26 MED ORDER — PREDNISONE 20 MG PO TABS: ORAL_TABLET | ORAL | Status: DC

## 2014-09-26 MED ORDER — KETOROLAC TROMETHAMINE 60 MG/2ML IM SOLN
INTRAMUSCULAR | Status: AC
  Filled 2014-09-26: qty 2

## 2014-09-26 MED ORDER — DEXAMETHASONE SODIUM PHOSPHATE 10 MG/ML IJ SOLN
INTRAMUSCULAR | Status: AC
  Filled 2014-09-26: qty 1

## 2014-09-26 MED ORDER — KETOROLAC TROMETHAMINE 60 MG/2ML IM SOLN
45.0000 mg | Freq: Once | INTRAMUSCULAR | Status: AC
  Administered 2014-09-26: 45 mg via INTRAMUSCULAR

## 2014-09-26 MED ORDER — DEXAMETHASONE SODIUM PHOSPHATE 10 MG/ML IJ SOLN
10.0000 mg | Freq: Once | INTRAMUSCULAR | Status: AC
  Administered 2014-09-26: 10 mg via INTRAMUSCULAR

## 2014-09-26 NOTE — Discharge Instructions (Signed)
Chronic Back Pain  When back pain lasts longer than 3 months, it is called chronic back pain.People with chronic back pain often go through certain periods that are more intense (flare-ups).  CAUSES Chronic back pain can be caused by wear and tear (degeneration) on different structures in your back. These structures include:  The bones of your spine (vertebrae) and the joints surrounding your spinal cord and nerve roots (facets).  The strong, fibrous tissues that connect your vertebrae (ligaments). Degeneration of these structures may result in pressure on your nerves. This can lead to constant pain. HOME CARE INSTRUCTIONS  Avoid bending, heavy lifting, prolonged sitting, and activities which make the problem worse.  Take brief periods of rest throughout the day to reduce your pain. Lying down or standing usually is better than sitting while you are resting.  Take over-the-counter or prescription medicines only as directed by your caregiver. SEEK IMMEDIATE MEDICAL CARE IF:   You have weakness or numbness in one of your legs or feet.  You have trouble controlling your bladder or bowels.  You have nausea, vomiting, abdominal pain, shortness of breath, or fainting. Document Released: 08/25/2004 Document Revised: 10/10/2011 Document Reviewed: 07/02/2011 [facility] Patient Information 2015 [place], [place]. This information is not intended to replace advice given to you by your health care provider. Make sure you discuss any questions you have with your health care provider.  Chronic Pain Chronic pain can be defined as pain that is off and on and lasts for 3-6 months or longer. Many things cause chronic pain, which can make it difficult to make a diagnosis. There are many treatment options available for chronic pain. However, finding a treatment that works well for you may require trying various approaches until the right one is found. Many people benefit from a combination of two or more types  of treatment to control their pain. SYMPTOMS  Chronic pain can occur anywhere in the body and can range from mild to very severe. Some types of chronic pain include:  Headache.  Low back pain.  Cancer pain.  Arthritis pain.  Neurogenic pain. This is pain resulting from damage to nerves. People with chronic pain may also have other symptoms such as:  Depression.  Anger.  Insomnia.  Anxiety. DIAGNOSIS  Your health care provider will help diagnose your condition over time. In many cases, the initial focus will be on excluding possible conditions that could be causing the pain. Depending on your symptoms, your health care provider may order tests to diagnose your condition. Some of these tests may include:   Blood tests.   CT scan.   MRI.   X-rays.   Ultrasounds.   Nerve conduction studies.  You may need to see a specialist.  TREATMENT  Finding treatment that works well may take time. You may be referred to a pain specialist. He or she may prescribe medicine or therapies, such as:   Mindful meditation or yoga.  Shots (injections) of numbing or pain-relieving medicines into the spine or area of pain.  Local electrical stimulation.  Acupuncture.   Massage therapy.   Aroma, color, light, or sound therapy.   Biofeedback.   Working with a physical therapist to keep from getting stiff.   Regular, gentle exercise.   Cognitive or behavioral therapy.   Group support.  Sometimes, surgery may be recommended.  HOME CARE INSTRUCTIONS   Take all medicines as directed by your health care provider.   Lessen stress in your life by relaxing and doing  things such as listening to calming music.   Exercise or be active as directed by your health care provider.   Eat a healthy diet and include things such as vegetables, fruits, fish, and lean meats in your diet.   Keep all follow-up appointments with your health care provider.   Attend a support  group with others suffering from chronic pain. SEEK MEDICAL CARE IF:   Your pain gets worse.   You develop a new pain that was not there before.   You cannot tolerate medicines given to you by your health care provider.   You have new symptoms since your last visit with your health care provider.  SEEK IMMEDIATE MEDICAL CARE IF:   You feel weak.   You have decreased sensation or numbness.   You lose control of bowel or bladder function.   Your pain suddenly gets much worse.   You develop shaking.  You develop chills.  You develop confusion.  You develop chest pain.  You develop shortness of breath.  MAKE SURE YOU:  Understand these instructions.  Will watch your condition.  Will get help right away if you are not doing well or get worse. Document Released: 04/09/2002 Document Revised: 03/20/2013 Document Reviewed: 01/11/2013 [facility] Patient Information 2015 [place]. This information is not intended to replace advice given to you by your health care provider. Make sure you discuss any questions you have with your health care provider.  Musculoskeletal Pain Musculoskeletal pain is muscle and boney aches and pains. These pains can occur in any part of the body. Your caregiver may treat you without knowing the cause of the pain. They may treat you if blood or urine tests, X-rays, and other tests were normal.  CAUSES There is often not a definite cause or reason for these pains. These pains may be caused by a type of germ (virus). The discomfort may also come from overuse. Overuse includes working out too hard when your body is not fit. Boney aches also come from weather changes. Bone is sensitive to atmospheric pressure changes. HOME CARE INSTRUCTIONS   Ask when your test results will be ready. Make sure you get your test results.  Only take over-the-counter or prescription medicines for pain, discomfort, or fever as directed by your caregiver. If you were  given medications for your condition, do not drive, operate machinery or power tools, or sign legal documents for 24 hours. Do not drink alcohol. Do not take sleeping pills or other medications that may interfere with treatment.  Continue all activities unless the activities cause more pain. When the pain lessens, slowly resume normal activities. Gradually increase the intensity and duration of the activities or exercise.  During periods of severe pain, bed rest may be helpful. Lay or sit in any position that is comfortable.  Putting ice on the injured area.  Put ice in a bag.  Place a towel between your skin and the bag.  Leave the ice on for 15 to 20 minutes, 3 to 4 times a day.  Follow up with your caregiver for continued problems and no reason can be found for the pain. If the pain becomes worse or does not go away, it may be necessary to repeat tests or do additional testing. Your caregiver may need to look further for a possible cause. SEEK IMMEDIATE MEDICAL CARE IF:  You have pain that is getting worse and is not relieved by medications.  You develop chest pain that is associated with shortness  or breath, sweating, feeling sick to your stomach (nauseous), or throw up (vomit).  Your pain becomes localized to the abdomen.  You develop any new symptoms that seem different or that concern you. MAKE SURE YOU:   Understand these instructions.  Will watch your condition.  Will get help right away if you are not doing well or get worse. Document Released: 07/18/2005 Document Revised: 10/10/2011 Document Reviewed: 03/22/2013 [facility] Patient Information 2015 [place], [place]. This information is not intended to replace advice given to you by your health care provider. Make sure you discuss any questions you have with your health care provider.

## 2014-09-26 NOTE — ED Notes (Signed)
C/o lower back pain onset 2 days; hx of chronic back pain Denies inj/trauma but recalls lifting heavy furniture over the weekend Taking Hydrocodone w/no relief; pain clinic sent her here Alert, no signs of acute distress.

## 2014-09-26 NOTE — ED Provider Notes (Signed)
CSN: [id number]     Arrival date & time 09/26/14  1958 History   First MD Initiated Contact with Patient 09/26/14 2032     Chief Complaint  Patient presents with  . Back Pain   (Consider location/radiation/quality/duration/timing/severity/associated sxs/prior Treatment) HPI Comments: 59 year old females complaining of moderate to severe pain across the lower back. She has a history of chronic low back pain and is prescribed Norco 10 mg on a monthly basis. She received 150 tablets on February 9 for her back pain. She states that in the last 24 hours she developed worsening pain across her lower back. She is also taking clonazepam and zolpidem and dextroamphetamine/amphetamine tablets. She states she went to her pain clinic this morning and they did not have a physician. They told her to go to the urgent care.   Past Medical History  Diagnosis Date  . Depression   . Low back pain   . Neck pain    History reviewed. No pertinent past surgical history. Family History  Problem Relation Age of Onset  . Leukemia Father   . Alcohol abuse Sister    History  Substance Use Topics  . Smoking status: Current Every Day Smoker -- 0.50 packs/day    Types: Cigarettes  . Smokeless tobacco: Not on file  . Alcohol Use: No   OB History    No data available     Review of Systems  Constitutional: Positive for activity change. Negative for fever.  HENT: Negative.   Respiratory: Negative.   Cardiovascular: Negative.   Musculoskeletal: Positive for myalgias and back pain.  Skin: Negative.     Allergies  Review of patient's allergies indicates no known allergies.  Home Medications   Prior to Admission medications   Medication Sig Start Date End Date Taking? Authorizing Provider  amphetamine-dextroamphetamine (ADDERALL XR) 20 MG 24 hr capsule Take 20 mg by mouth daily.   Yes Historical Provider, MD  buPROPion (WELLBUTRIN XL) 150 MG 24 hr tablet Take 150 mg by mouth daily. 3 tablets daily    Yes  Historical Provider, MD  clonazePAM (KLONOPIN) 0.5 MG tablet Take 0.5 mg by mouth 2 (two) times daily as needed for anxiety.   Yes Historical Provider, MD  HYDROcodone-acetaminophen (NORCO) 10-325 MG per tablet Take 1 tablet by mouth every 6 (six) hours as needed.   Yes Historical Provider, MD  zolpidem (AMBIEN) 10 MG tablet Take 10 mg by mouth at bedtime as needed for sleep.   Yes Historical Provider, MD  oxyCODONE-acetaminophen (PERCOCET) 10-325 MG per tablet Take 1 tablet by mouth every 4 (four) hours as needed for pain. 03/03/13   Arthor CaptainAbigail Harris, PA-C  predniSONE (DELTASONE) 20 MG tablet Take 3 tabs po on first day, 2 tabs second day, 2 tabs third day, 1 tab fourth day, 1 tab 5th day. Take with food. Start 09/27/2014 09/26/14   Hayden Rasmussenavid Chane Cowden, NP   BP 120/83 mmHg  Pulse 84  Temp(Src) 98 F (36.7 C) (Oral)  Resp 20  SpO2 100% Physical Exam  Constitutional: She appears well-developed and well-nourished. No distress.  Pulmonary/Chest: Effort normal. No respiratory distress.  Musculoskeletal: She exhibits tenderness. She exhibits no edema.  Patient points to the low lumbosacral spine as the area of pain. there is tenderness across the lumbosacral musculature. No spinal tenderness, deformity or discoloration. For some reason she bent down and got on her knees on the floor to show me where the pain was and then was unable to get back up. Had to help  her up to the chair.   Neurological: She is alert. She exhibits normal muscle tone.  Skin: Skin is warm and dry.  Psychiatric:  Speech is slurred, movements are somewhat torpid.  Nursing note and vitals reviewed.   ED Course  Procedures (including critical care time) Labs Review Labs Reviewed - No data to display  Imaging Review No results found.   MDM   1. Acute back pain   2. Chronic low back pain    The description of the pain as well as that which makes it worse the area of tenderness suggest this is muscular in etiology. 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 Katrinka Blazing been the etiology for this flareup. She is taking Norco 10 mg and she may continue to take those and she will not receive stronger narcotics tonight. We will treat with Toradol 45 mg IM and Decadron 10 mg IM and a prescription for prednisone taper dose. She is to follow-up with pain clinic or her PCP as needed. Recommend that if she is unable to get up and do her work tomorrow that she may choose not to do her work and do some sitting, apply heat and take her current medication regimen.      Hayden Rasmussen, NP 09/26/14 2110

## 2016-06-17 ENCOUNTER — Ambulatory Visit (INDEPENDENT_AMBULATORY_CARE_PROVIDER_SITE_OTHER): Payer: Medicare Other

## 2016-06-17 ENCOUNTER — Ambulatory Visit (INDEPENDENT_AMBULATORY_CARE_PROVIDER_SITE_OTHER): Payer: Medicare Other | Admitting: Physician Assistant

## 2016-06-17 VITALS — BP 122/72 | HR 95 | Temp 98.0°F | Resp 18 | Ht 65.0 in | Wt 105.0 lb

## 2016-06-17 DIAGNOSIS — R059 Cough, unspecified: Secondary | ICD-10-CM

## 2016-06-17 DIAGNOSIS — R05 Cough: Secondary | ICD-10-CM

## 2016-06-17 DIAGNOSIS — J3489 Other specified disorders of nose and nasal sinuses: Secondary | ICD-10-CM

## 2016-06-17 DIAGNOSIS — J069 Acute upper respiratory infection, unspecified: Secondary | ICD-10-CM

## 2016-06-17 MED ORDER — IPRATROPIUM BROMIDE 0.03 % NA SOLN: 2.0000 | Freq: Two times a day (BID) | NASAL | 0 refills | Status: DC

## 2016-06-17 MED ORDER — PSEUDOEPHEDRINE HCL ER 120 MG PO TB12: 120.0000 mg | ORAL_TABLET | Freq: Two times a day (BID) | ORAL | 0 refills | Status: DC

## 2016-06-17 MED ORDER — HYDROCOD POLST-CPM POLST ER 10-8 MG/5ML PO SUER: 5.0000 mL | Freq: Two times a day (BID) | ORAL | 0 refills | Status: DC | PRN

## 2016-06-17 MED ORDER — BENZONATATE 100 MG PO CAPS: 100.0000 mg | ORAL_CAPSULE | Freq: Three times a day (TID) | ORAL | 0 refills | Status: DC | PRN

## 2016-06-17 NOTE — Progress Notes (Signed)
MRN: [id number] DOB: 03/24/1956  Subjective:   Deborah [name] is a 60 y.o. female presenting for chief complaint of Shortness of Breath and Laryngitis .  Reports one week history of rhinorrhea, dry cough (no hemoptysis) and hoarseness, fatigue. Notes she has had major sleep disturbance due to the cough at night time. Has tried sleeping medicaiton relief. Denies fever, sinus congestion, ear pain, sore throat, wheezing and shortness of breath, night sweats, chills, nausea, vomiting, abdominal pain and diarrhea. Has not had any sick contact exposure. Denies  history of seasonal allergies, COPD, asthma. Pt is a smoker. She smokes five cigarettes a day for 40 years. Has had history of pneumonia  Denies any other aggravating or relieving factors, no other questions or concerns.  Deborah [name] has a current medication list which includes the following prescription(s): bupropion, zolpidem, amphetamine-dextroamphetamine, clonazepam, and hydrocodone-acetaminophen. Also has No Known Allergies.  Deborah [name]  has a past medical history of Depression; Low back pain; and Neck pain. Also  has no past surgical history on file.   Objective:   Vitals: BP 122/72 (BP Location: Right Arm, Patient Position: Sitting, Cuff Size: Normal)   Pulse 95   Temp 98 F (36.7 C) (Oral)   Resp 18   Ht 5\' 5"  (1.651 m)   Wt 105 lb (47.6 kg)   SpO2 97%   BMI 17.47 kg/m   Physical Exam  Constitutional:  She is sitting on edge of exam table, bent over, crying intermittently during exam.   HENT:  Right Ear: Tympanic membrane, external ear and ear canal normal.  Left Ear: Tympanic membrane, external ear and ear canal normal.  Nose: Rhinorrhea present. Right sinus exhibits maxillary sinus tenderness and frontal sinus tenderness. Left sinus exhibits maxillary sinus tenderness and frontal sinus tenderness.  Mouth/Throat: Uvula is midline and mucous membranes are normal. Posterior oropharyngeal erythema present.    Pulmonary/Chest: Effort normal.  Difficult lung exam due to patient mouth breathing and coughing.   Lymphadenopathy:       Head (right side): No submental, no submandibular, no tonsillar, no preauricular, no posterior auricular and no occipital adenopathy present.       Head (left side): No submandibular, no tonsillar, no preauricular, no posterior auricular and no occipital adenopathy present.       Right: No supraclavicular adenopathy present.       Left: No supraclavicular adenopathy present.  Psychiatric: She exhibits a depressed mood.    Dg Chest 2 View  Result Date: 06/17/2016 CLINICAL DATA:  Cough for 1 week. EXAM: CHEST  2 VIEW COMPARISON:  Radiographs of March 31, 2006. FINDINGS: The heart size and mediastinal contours are within normal limits. Both lungs are clear. No pneumothorax or pleural effusion is noted. The visualized skeletal structures are unremarkable. IMPRESSION: No active cardiopulmonary disease. Electronically Signed   By: Lupita RaiderJames  Green Jr, M.D.   On: 06/17/2016 13:47    No results found for this or any previous visit (from the past 24 hour(s)).  Assessment and Plan :  1. Cough - DG Chest 2 View; Future - chlorpheniramine-HYDROcodone (TUSSIONEX PENNKINETIC ER) 10-8 MG/5ML SUER; Take 5 mLs by mouth every 12 (twelve) hours as needed for cough.  Dispense: 100 mL; Refill: 0 - benzonatate (TESSALON) 100 MG capsule; Take 1-2 capsules (100-200 mg total) by mouth 3 (three) times daily as needed for cough.  Dispense: 40 capsule; Refill: 0  2. Rhinorrhea - ipratropium (ATROVENT) 0.03 % nasal spray; Place 2 sprays into both nostrils 2 (two) times daily.  Dispense: 30 mL; Refill: 0  3. Acute upper respiratory infection -Likely viral etiology, will treat symptomatically. Pt to follow up in 5 days if no improvement with treatment. Return sooner if symptoms worsen.  - pseudoephedrine (SUDAFED 12 HOUR) 120 MG 12 hr tablet; Take 1 tablet (120 mg total) by mouth 2 (two) times  daily.  Dispense: 30 tablet; Refill: 0   Deborah CoreBrittany Zyara Riling, PA-C  [facility] 06/17/2016 1:50 PM

## 2016-06-17 NOTE — Patient Instructions (Addendum)
-   We will treat this as a respiratory viral infection.  - I recommend you rest, drink plenty of fluids, eat light meals including soups.  - You may use cough syrup at night for your cough and sore throat, Tessalon pearls during the day. Be aware that cough syrup can definitely make you drowsy and sleepy so do not drive or operate any heavy machinery if it is affecting you during the day.  - You may also use Tylenol or ibuprofen over-the-counter for your sore throat.  -You may use atrovent for runny nose. - You may use sudaphed for congestion.  - Please let me know if you are not seeing any improvement or get worse in 5 days.      IF you received an x-ray today, you will receive an invoice from [facility]. Please contact [facility] at [phone] with questions or concerns regarding your invoice.   IF you received labwork today, you will receive an invoice from [facility]. Please contact [facility] at [phone] with questions or concerns regarding your invoice.   Our billing staff will not be able to assist you with questions regarding bills from these companies.  You will be contacted with the lab results as soon as they are available. The fastest way to get your results is to activate your My Chart account. Instructions are located on the last page of this paperwork. If you have not heard from us regarding the results in 2 weeks, please contact this office.

## 2017-01-25 ENCOUNTER — Emergency Department (HOSPITAL_COMMUNITY)
Admission: EM | Admit: 2017-01-25 | Discharge: 2017-01-26 | Disposition: A | Discharge status: Home / Self Care | Payer: Medicare Other | Attending: Emergency Medicine | Admitting: Emergency Medicine

## 2017-01-25 ENCOUNTER — Encounter (HOSPITAL_COMMUNITY): Payer: Self-pay | Admitting: Emergency Medicine

## 2017-01-25 DIAGNOSIS — F17210 Nicotine dependence, cigarettes, uncomplicated: Secondary | ICD-10-CM | POA: Diagnosis not present

## 2017-01-25 DIAGNOSIS — Z79899 Other long term (current) drug therapy: Secondary | ICD-10-CM | POA: Diagnosis not present

## 2017-01-25 DIAGNOSIS — Z046 Encounter for general psychiatric examination, requested by authority: Secondary | ICD-10-CM | POA: Diagnosis present

## 2017-01-25 DIAGNOSIS — F332 Major depressive disorder, recurrent severe without psychotic features: Secondary | ICD-10-CM | POA: Diagnosis not present

## 2017-01-25 LAB — URINALYSIS, ROUTINE W REFLEX MICROSCOPIC
Bilirubin Urine: NEGATIVE
Glucose, UA: NEGATIVE mg/dL
Hgb urine dipstick: NEGATIVE
KETONES UR: 5 mg/dL — AB
Nitrite: NEGATIVE
PH: 5 (ref 5.0–8.0)
Protein, ur: NEGATIVE mg/dL
SPECIFIC GRAVITY, URINE: 1.023 (ref 1.005–1.030)
SQUAMOUS EPITHELIAL / LPF: NONE SEEN

## 2017-01-25 LAB — CBC
HEMATOCRIT: 40.3 % (ref 36.0–46.0)
HEMOGLOBIN: 13.8 g/dL (ref 12.0–15.0)
MCH: 31.6 pg (ref 26.0–34.0)
MCHC: 34.2 g/dL (ref 30.0–36.0)
MCV: 92.2 fL (ref 78.0–100.0)
Platelets: 263 10*3/uL (ref 150–400)
RBC: 4.37 MIL/uL (ref 3.87–5.11)
RDW: 12.8 % (ref 11.5–15.5)
WBC: 7.4 10*3/uL (ref 4.0–10.5)

## 2017-01-25 LAB — COMPREHENSIVE METABOLIC PANEL
ALT: 37 U/L (ref 14–54)
AST: 28 U/L (ref 15–41)
Albumin: 5 g/dL (ref 3.5–5.0)
Alkaline Phosphatase: 76 U/L (ref 38–126)
Anion gap: 9 (ref 5–15)
BUN: 18 mg/dL (ref 6–20)
CHLORIDE: 107 mmol/L (ref 101–111)
CO2: 25 mmol/L (ref 22–32)
Calcium: 9.4 mg/dL (ref 8.9–10.3)
Creatinine, Ser: 0.73 mg/dL (ref 0.44–1.00)
Glucose, Bld: 121 mg/dL — ABNORMAL HIGH (ref 65–99)
POTASSIUM: 3.9 mmol/L (ref 3.5–5.1)
Sodium: 141 mmol/L (ref 135–145)
Total Bilirubin: 0.4 mg/dL (ref 0.3–1.2)
Total Protein: 7.2 g/dL (ref 6.5–8.1)

## 2017-01-25 LAB — RAPID URINE DRUG SCREEN, HOSP PERFORMED
Amphetamines: NOT DETECTED
BARBITURATES: NOT DETECTED
BENZODIAZEPINES: POSITIVE — AB
COCAINE: NOT DETECTED
Opiates: NOT DETECTED
TETRAHYDROCANNABINOL: NOT DETECTED

## 2017-01-25 LAB — ETHANOL

## 2017-01-25 MED ORDER — ACETAMINOPHEN 325 MG PO TABS: 650.0000 mg | ORAL_TABLET | ORAL | Status: DC | PRN

## 2017-01-25 MED ORDER — LORAZEPAM 1 MG PO TABS
1.0000 mg | ORAL_TABLET | ORAL | Status: DC | PRN
Start: 2017-01-25 — End: 2017-01-26

## 2017-01-25 MED ORDER — NICOTINE 21 MG/24HR TD PT24: 21.0000 mg | MEDICATED_PATCH | Freq: Once | TRANSDERMAL | Status: DC

## 2017-01-25 MED ORDER — ONDANSETRON HCL 4 MG PO TABS: 4.0000 mg | ORAL_TABLET | Freq: Three times a day (TID) | ORAL | Status: DC | PRN

## 2017-01-25 MED ORDER — IBUPROFEN 200 MG PO TABS: 600.0000 mg | ORAL_TABLET | Freq: Three times a day (TID) | ORAL | Status: DC | PRN

## 2017-01-25 NOTE — BH Assessment (Signed)
Donell SievertSpencer Simon recommends Inpatient psychiatric treatment. TTS to look for placement

## 2017-01-25 NOTE — ED Notes (Signed)
Pt presents under IVC, reporting pt broke into a strangers truck, rambling through his things.  Reporting pt is psychotic.  Pt wandering hallway at intervals.  Awake, alert & responsive, no distress noted,  Monitoring for safety, Q 15 min checks in effect.  Safety check for contraband completed, no items found.

## 2017-01-25 NOTE — ED Notes (Signed)
pts belongings have been collected and placed in locker 29.
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
## 2017-01-25 NOTE — ED Notes (Signed)
Bed: WA29 Expected date:  Expected time:  Means of arrival:  Comments: Deborah [name]

## 2017-01-25 NOTE — ED Notes (Signed)
Attempted to call emergency contacts without answer.

## 2017-01-25 NOTE — ED Notes (Signed)
Bed: [facility] Expected date:  Expected time:  Means of arrival:  Comments: 

## 2017-01-25 NOTE — ED Provider Notes (Signed)
[facility] Provider Note   CSN: [id number] Arrival date & time: 01/25/17  1530     History   Chief Complaint Chief Complaint  Patient presents with  . Psychiatric Evaluation  . IVC    HPI Deborah [name] is a 61 y.o. female.  The history is provided by the patient and the police. No language interpreter was used.   Deborah GrinderBernadette M Cerveny is a 61 y.o. female who presents to the Emergency Department complaining of psychiatric evaluation.  Level V caveat due to psychiatric illness.  She presents via police for psychiatric evaluation. She states that she was in her psychiatrist's office today and was prescribed new medications and was on her way home when a padlock was found in her water and she was trying to find a house and the police stopped her when she was walking home. Police report that they were called on breaking and entering. She was found digging through belongings in the cab of a dump truck. She told police that she was at her home. She gave her home address and she was not at that location. She denies any SI, HI, hallucinations. No prior similar symptoms. She states she lives at home alone. Past Medical History:  Diagnosis Date  . Depression   . Low back pain   . Neck pain     Patient Active Problem List   Diagnosis Date Noted  . CONTUSION OF TOE 02/04/2010  . INSOMNIA 01/26/2010  . ACTINIC KERATOSIS 08/28/2009  . GROIN STRAIN, RIGHT 04/22/2009  . LACERATION, FOOT 02/03/2009  . ABDOMINAL PAIN 12/01/2008  . CONTUSION OF FOOT 11/03/2008  . LOW BACK PAIN 10/02/2008  . BACK PAIN 09/01/2008  . LUMBAR STRAIN 06/20/2008  . ANIMAL BITE 02/18/2008  . URI 10/31/2007  . RESTLESS LEG SYNDROME, MILD 06/26/2007  . DEPRESSION 06/25/2007    History reviewed. No pertinent surgical history.  OB History    No data available       Home Medications    Prior to Admission medications   Medication Sig Start Date End Date Taking? Authorizing Provider    amphetamine-dextroamphetamine (ADDERALL XR) 20 MG 24 hr capsule Take 20 mg by mouth daily.   Yes [provider]  buPROPion (WELLBUTRIN XL) 150 MG 24 hr tablet Take 450 mg by mouth daily.    Yes [provider]  clonazePAM (KLONOPIN) 0.5 MG tablet Take 0.5 mg by mouth 2 (two) times daily as needed for anxiety.   Yes [provider]  benzonatate (TESSALON) 100 MG capsule Take 1-2 capsules (100-200 mg total) by mouth 3 (three) times daily as needed for cough. Patient not taking: Reported on 01/25/2017 06/17/16   Benjiman CoreWiseman, Brittany D, PA-C  chlorpheniramine-HYDROcodone [facility](TUSSIONEX PENNKINETIC ER) 10-8 MG/5ML SUER Take 5 mLs by mouth every 12 (twelve) hours as needed for cough. Patient not taking: Reported on 01/25/2017 06/17/16   Benjiman CoreWiseman, Brittany D, PA-C  ipratropium (ATROVENT) 0.03 % nasal spray Place 2 sprays into both nostrils 2 (two) times daily. Patient not taking: Reported on 01/25/2017 06/17/16   Magdalene RiverWiseman, Brittany D, PA-C  pseudoephedrine (SUDAFED 12 HOUR) 120 MG 12 hr tablet Take 1 tablet (120 mg total) by mouth 2 (two) times daily. Patient not taking: Reported on 01/25/2017 06/17/16   Magdalene RiverWiseman, Brittany D, PA-C    Family History Family History  Problem Relation Age of Onset  . Leukemia Father   . Alcohol abuse Sister     Social History Social History  Substance Use Topics  .  Smoking status: Current Every Day Smoker    Packs/day: 0.50    Types: Cigarettes  . Smokeless tobacco: Never Used  . Alcohol use No     Allergies   Patient has no known allergies.   Review of Systems Review of Systems  All other systems reviewed and are negative.    Physical Exam Updated Vital Signs BP 132/86 (BP Location: Right Arm)   Pulse (!) 110   Temp 98.7 F (37.1 C) (Oral)   Resp 14   SpO2 96%   Physical Exam  Constitutional: She is oriented to person, place, and time. She appears well-developed and well-nourished.  HENT:  Head: Normocephalic and atraumatic.   Cardiovascular: Normal rate and regular rhythm.   No murmur heard. Pulmonary/Chest: Effort normal and breath sounds normal. No respiratory distress.  Abdominal: Soft. There is no tenderness. There is no rebound and no guarding.  Musculoskeletal: She exhibits no edema or tenderness.  Neurological: She is alert and oriented to person, place, and time.  Skin: Skin is warm and dry.  Psychiatric:  Anxious with tangential and disorganized thought process.  Nursing note and vitals reviewed.    ED Treatments / Results  Labs (all labs ordered are listed, but only abnormal results are displayed) Labs Reviewed  COMPREHENSIVE METABOLIC PANEL - Abnormal; Notable for the following:       Result Value   Glucose, Bld 121 (*)    All other components within normal limits  RAPID URINE DRUG SCREEN, HOSP PERFORMED - Abnormal; Notable for the following:    Benzodiazepines POSITIVE (*)    All other components within normal limits  URINALYSIS, ROUTINE W REFLEX MICROSCOPIC - Abnormal; Notable for the following:    Ketones, ur 5 (*)    Leukocytes, UA SMALL (*)    Bacteria, UA RARE (*)    All other components within normal limits  ETHANOL  CBC    EKG  EKG Interpretation  Date/Time:  Wednesday January 25 2017 17:49:12 EDT Ventricular Rate:  88 PR Interval:    QRS Duration: 92 QT Interval:  363 QTC Calculation: 440 R Axis:   -12 Text Interpretation:  Sinus rhythm Low voltage, extremity leads Confirmed by Lincoln Brigham [phone]) on 01/25/2017 7:38:48 PM       Radiology No results found.  Procedures Procedures (including critical care time)  Medications Ordered in ED Medications  nicotine (NICODERM CQ - dosed in mg/24 hours) patch 21 mg (not administered)  LORazepam (ATIVAN) tablet 1 mg (not administered)  acetaminophen (TYLENOL) tablet 650 mg (not administered)  ibuprofen (ADVIL,MOTRIN) tablet 600 mg (not administered)  ondansetron (ZOFRAN) tablet 4 mg (not administered)     Initial  Impression / Assessment and Plan / ED Course  I have reviewed the triage vital signs and the nursing notes.  Pertinent labs & imaging results that were available during my care of the patient were reviewed by me and considered in my medical decision making (see chart for details).     Patient with history of depression here with bizarre and paranoid behavior by police. She was IVCd on ED arrival due to concern for psychosis. She has been medically cleared for psychiatric evaluation and treatment.   Presentation is not consistent with acute CVA, acute delirium or infectious process.  Final Clinical Impressions(s) / ED Diagnoses   Final diagnoses:  None    New Prescriptions New Prescriptions   No medications on file     Tilden Fossa, MD 01/25/17 2324
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
## 2017-01-26 DIAGNOSIS — F332 Major depressive disorder, recurrent severe without psychotic features: Secondary | ICD-10-CM | POA: Diagnosis present

## 2017-01-26 NOTE — BH Assessment (Signed)
[facility] Assessment Progress Note  Per Thedore MinsMojeed Akintayo, MD, this pt does not require psychiatric hospitalization at this time.  [facility] to continue treatment with [facility] SavageLisa Poulos and [facility].  This has been included in pt's discharge instructions.  Pt's nurse, Kendal Hymendie, has been notified.  Doylene Canninghomas Henson Fraticelli, MA Triage Specialist [phone]

## 2017-01-26 NOTE — Discharge Instructions (Signed)
For your ongoing behavioral health needs you are advised to continue treatment with Ellis SavageLisa Poulos at [facility]:       [facility]      [address], [address]      [place], [place] [address]      [phone]

## 2017-01-26 NOTE — ED Notes (Signed)
Pt discharged safely with all belongings.  Discharge instructions were reviewed with patient.

## 2017-01-26 NOTE — [facility] Suicide Risk Assessment (Signed)
Suicide Risk Assessment  Discharge Assessment   [facility]   Principal Problem: MDD (major depressive disorder), recurrent severe, without psychosis (HCC) Discharge Diagnoses:  Patient Active Problem List   Diagnosis Date Noted  . MDD (major depressive disorder), recurrent severe, without psychosis (HCC) [F33.2] 01/26/2017  . CONTUSION OF TOE [Z61.096E][S90.129A] 02/04/2010  . INSOMNIA [G47.00] 01/26/2010  . ACTINIC KERATOSIS [L57.0] 08/28/2009  . GROIN STRAIN, RIGHT [IMO0002] 04/22/2009  . LACERATION, FOOT [S91.309A] 02/03/2009  . ABDOMINAL PAIN [R10.9] 12/01/2008  . CONTUSION OF FOOT [S90.30XA] 11/03/2008  . LOW BACK PAIN [M54.5] 10/02/2008  . BACK PAIN [M54.9] 09/01/2008  . LUMBAR STRAIN [S33.5XXA] 06/20/2008  . ANIMAL BITE [T07.XXXA] 02/18/2008  . URI [J06.9] 10/31/2007  . RESTLESS LEG SYNDROME, MILD [G25.81] 06/26/2007  . DEPRESSION [F32.9] 06/25/2007    Total Time spent with patient: 30 minutes  Musculoskeletal: Strength & Muscle Tone: within normal limits Gait & Station: normal Patient leans: N/A Psychiatric Specialty Exam: Physical Exam  Constitutional: She appears well-developed and well-nourished.  HENT:  Head: Normocephalic.  Respiratory: Effort normal.  Musculoskeletal: Normal range of motion.  Neurological: She is alert.  Psychiatric: Her speech is normal and behavior is normal. Thought content normal. Her mood appears anxious. Cognition and memory are normal. She expresses impulsivity. She exhibits a depressed mood.   Review of Systems  Psychiatric/Behavioral: Positive for depression. Negative for hallucinations, memory loss, substance abuse and suicidal ideas. The patient is nervous/anxious. The patient does not have insomnia.   All other systems reviewed and are negative.  Blood pressure 107/70, pulse 67, temperature 98.1 F (36.7 C), temperature source Oral, resp. rate 18, SpO2 95 %.There is no height or weight on file to calculate  BMI. General Appearance: Casual Eye Contact:  Good Speech:  Clear and Coherent and Pressured Volume:  Normal Mood:  Anxious and Depressed Affect:  Congruent and Depressed Thought Process:  Coherent, Goal Directed and Linear Orientation:  Full (Time, Place, and Person) Thought Content:  Logical Suicidal Thoughts:  No Homicidal Thoughts:  No Memory:  Immediate;   Good Recent;   Fair Remote;   Fair Judgement:  Fair Insight:  Fair Psychomotor Activity:  Normal Concentration:  Concentration: Good and Attention Span: Good Recall:  Good Fund of Knowledge:  Good Language:  Good Akathisia:  No Handed:  Right AIMS (if indicated):    Assets:  ArchitectCommunication Skills Financial Resources/Insurance Housing Social Support ADL's:  Intact Cognition:  WNL Sleep:     Mental Status Per Nursing Assessment::   On Admission:   confused  Demographic Factors:  Living alone  Loss Factors: NA  Historical Factors: Impulsivity  Risk Reduction Factors:   Positive therapeutic relationship  Continued Clinical Symptoms:  Severe Anxiety and/or Agitation Depression:   Severe  Cognitive Features That Contribute To Risk:  None    Suicide Risk:  Minimal: No identifiable suicidal ideation.  Patients presenting with no risk factors but with morbid ruminations; may be classified as minimal risk based on the severity of the depressive symptoms    Plan Of Care/Follow-up recommendations:  Activity:  as tolerated Diet:  Heart healthy  Laveda AbbeLaurie Britton Nicolasa Milbrath, NP 01/26/2017, 11:33 AM
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
## 2017-02-22 ENCOUNTER — Inpatient Hospital Stay (HOSPITAL_COMMUNITY)
Admission: EM | Admit: 2017-02-22 | Discharge: 2017-03-16 | DRG: 958 | Disposition: A | Discharge status: Skilled Nursing Facility | Payer: No Typology Code available for payment source | Attending: Orthopedic Surgery | Admitting: Orthopedic Surgery

## 2017-02-22 ENCOUNTER — Emergency Department (HOSPITAL_COMMUNITY): Payer: No Typology Code available for payment source | Admitting: Anesthesiology

## 2017-02-22 ENCOUNTER — Encounter (HOSPITAL_COMMUNITY): Admission: EM | Disposition: A | Discharge status: Skilled Nursing Facility | Payer: Self-pay | Source: Home / Self Care

## 2017-02-22 ENCOUNTER — Emergency Department (HOSPITAL_COMMUNITY): Payer: No Typology Code available for payment source

## 2017-02-22 ENCOUNTER — Emergency Department (HOSPITAL_COMMUNITY)
Admission: EM | Admit: 2017-02-22 | Disposition: A | Discharge status: Still In Hospital | Payer: Medicare Other | Source: Home / Self Care | Attending: Emergency Medicine | Admitting: Emergency Medicine

## 2017-02-22 DIAGNOSIS — R402142 Coma scale, eyes open, spontaneous, at arrival to emergency department: Secondary | ICD-10-CM | POA: Diagnosis present

## 2017-02-22 DIAGNOSIS — S01112A Laceration without foreign body of left eyelid and periocular area, initial encounter: Secondary | ICD-10-CM | POA: Diagnosis present

## 2017-02-22 DIAGNOSIS — F909 Attention-deficit hyperactivity disorder, unspecified type: Secondary | ICD-10-CM | POA: Diagnosis present

## 2017-02-22 DIAGNOSIS — R402342 Coma scale, best motor response, flexion withdrawal, at arrival to emergency department: Secondary | ICD-10-CM | POA: Diagnosis present

## 2017-02-22 DIAGNOSIS — R443 Hallucinations, unspecified: Secondary | ICD-10-CM | POA: Diagnosis not present

## 2017-02-22 DIAGNOSIS — T1490XA Injury, unspecified, initial encounter: Secondary | ICD-10-CM | POA: Diagnosis present

## 2017-02-22 DIAGNOSIS — S01511A Laceration without foreign body of lip, initial encounter: Secondary | ICD-10-CM | POA: Diagnosis present

## 2017-02-22 DIAGNOSIS — F419 Anxiety disorder, unspecified: Secondary | ICD-10-CM | POA: Diagnosis not present

## 2017-02-22 DIAGNOSIS — R339 Retention of urine, unspecified: Secondary | ICD-10-CM | POA: Diagnosis not present

## 2017-02-22 DIAGNOSIS — D62 Acute posthemorrhagic anemia: Secondary | ICD-10-CM | POA: Diagnosis not present

## 2017-02-22 DIAGNOSIS — S82142C Displaced bicondylar fracture of left tibia, initial encounter for open fracture type IIIA, IIIB, or IIIC: Secondary | ICD-10-CM | POA: Diagnosis present

## 2017-02-22 DIAGNOSIS — T148XXA Other injury of unspecified body region, initial encounter: Secondary | ICD-10-CM

## 2017-02-22 DIAGNOSIS — F603 Borderline personality disorder: Secondary | ICD-10-CM | POA: Diagnosis present

## 2017-02-22 DIAGNOSIS — G9389 Other specified disorders of brain: Secondary | ICD-10-CM | POA: Diagnosis present

## 2017-02-22 DIAGNOSIS — S022XXA Fracture of nasal bones, initial encounter for closed fracture: Secondary | ICD-10-CM | POA: Diagnosis present

## 2017-02-22 DIAGNOSIS — S0181XA Laceration without foreign body of other part of head, initial encounter: Secondary | ICD-10-CM | POA: Diagnosis present

## 2017-02-22 DIAGNOSIS — Z806 Family history of leukemia: Secondary | ICD-10-CM | POA: Diagnosis not present

## 2017-02-22 DIAGNOSIS — Z23 Encounter for immunization: Secondary | ICD-10-CM | POA: Diagnosis not present

## 2017-02-22 DIAGNOSIS — Z419 Encounter for procedure for purposes other than remedying health state, unspecified: Secondary | ICD-10-CM

## 2017-02-22 DIAGNOSIS — F17210 Nicotine dependence, cigarettes, uncomplicated: Secondary | ICD-10-CM | POA: Diagnosis present

## 2017-02-22 DIAGNOSIS — E871 Hypo-osmolality and hyponatremia: Secondary | ICD-10-CM | POA: Diagnosis not present

## 2017-02-22 DIAGNOSIS — S82202A Unspecified fracture of shaft of left tibia, initial encounter for closed fracture: Secondary | ICD-10-CM | POA: Diagnosis present

## 2017-02-22 DIAGNOSIS — M25522 Pain in left elbow: Secondary | ICD-10-CM

## 2017-02-22 DIAGNOSIS — S02119A Unspecified fracture of occiput, initial encounter for closed fracture: Secondary | ICD-10-CM | POA: Diagnosis present

## 2017-02-22 DIAGNOSIS — F332 Major depressive disorder, recurrent severe without psychotic features: Secondary | ICD-10-CM | POA: Diagnosis present

## 2017-02-22 DIAGNOSIS — S82141C Displaced bicondylar fracture of right tibia, initial encounter for open fracture type IIIA, IIIB, or IIIC: Secondary | ICD-10-CM | POA: Diagnosis present

## 2017-02-22 DIAGNOSIS — Z87828 Personal history of other (healed) physical injury and trauma: Secondary | ICD-10-CM

## 2017-02-22 DIAGNOSIS — S066X9A Traumatic subarachnoid hemorrhage with loss of consciousness of unspecified duration, initial encounter: Principal | ICD-10-CM | POA: Diagnosis present

## 2017-02-22 DIAGNOSIS — S82201A Unspecified fracture of shaft of right tibia, initial encounter for closed fracture: Secondary | ICD-10-CM | POA: Diagnosis present

## 2017-02-22 DIAGNOSIS — T07XXXA Unspecified multiple injuries, initial encounter: Secondary | ICD-10-CM

## 2017-02-22 DIAGNOSIS — D72829 Elevated white blood cell count, unspecified: Secondary | ICD-10-CM

## 2017-02-22 DIAGNOSIS — I959 Hypotension, unspecified: Secondary | ICD-10-CM | POA: Diagnosis present

## 2017-02-22 DIAGNOSIS — S82201C Unspecified fracture of shaft of right tibia, initial encounter for open fracture type IIIA, IIIB, or IIIC: Secondary | ICD-10-CM | POA: Diagnosis not present

## 2017-02-22 DIAGNOSIS — R402242 Coma scale, best verbal response, confused conversation, at arrival to emergency department: Secondary | ICD-10-CM | POA: Diagnosis present

## 2017-02-22 DIAGNOSIS — S82202C Unspecified fracture of shaft of left tibia, initial encounter for open fracture type IIIA, IIIB, or IIIC: Secondary | ICD-10-CM | POA: Diagnosis not present

## 2017-02-22 DIAGNOSIS — Z811 Family history of alcohol abuse and dependence: Secondary | ICD-10-CM | POA: Diagnosis not present

## 2017-02-22 HISTORY — PX: EXTERNAL FIXATION LEG: SHX1549

## 2017-02-22 HISTORY — DX: Personal history of other (healed) physical injury and trauma: Z87.828

## 2017-02-22 HISTORY — PX: I & D EXTREMITY: SHX5045

## 2017-02-22 HISTORY — PX: FACIAL LACERATION REPAIR: SHX6589

## 2017-02-22 LAB — I-STAT CHEM 8, ED
BUN: 19 mg/dL (ref 6–20)
CALCIUM ION: 1.08 mmol/L — AB (ref 1.15–1.40)
CHLORIDE: 107 mmol/L (ref 101–111)
Creatinine, Ser: 0.7 mg/dL (ref 0.44–1.00)
GLUCOSE: 133 mg/dL — AB (ref 65–99)
HCT: 36 % (ref 36.0–46.0)
Hemoglobin: 12.2 g/dL (ref 12.0–15.0)
Potassium: 4 mmol/L (ref 3.5–5.1)
Sodium: 142 mmol/L (ref 135–145)
TCO2: 20 mmol/L (ref 0–100)

## 2017-02-22 LAB — BPAM FFP
BLOOD PRODUCT EXPIRATION DATE: 201807282359
Blood Product Expiration Date: 201807282359
ISSUE DATE / TIME: 201807252127
ISSUE DATE / TIME: 201807252127
UNIT TYPE AND RH: 600
Unit Type and Rh: 6200

## 2017-02-22 LAB — I-STAT CG4 LACTIC ACID, ED: LACTIC ACID, VENOUS: 4.7 mmol/L — AB (ref 0.5–1.9)

## 2017-02-22 SURGERY — IRRIGATION AND DEBRIDEMENT EXTREMITY
Anesthesia: General | Site: Leg Lower

## 2017-02-22 MED ORDER — CLONAZEPAM 0.5 MG PO TABS
0.5000 mg | ORAL_TABLET | Freq: Two times a day (BID) | ORAL | Status: DC | PRN
  Administered 2017-02-23: 0.5 mg via ORAL
  Filled 2017-02-22: qty 1

## 2017-02-22 MED ORDER — HYDRALAZINE HCL 20 MG/ML IJ SOLN: 10.0000 mg | INTRAMUSCULAR | Status: DC | PRN

## 2017-02-22 MED ORDER — SUCCINYLCHOLINE CHLORIDE 20 MG/ML IJ SOLN
INTRAMUSCULAR | Status: DC | PRN
  Administered 2017-02-22: 80 mg via INTRAVENOUS

## 2017-02-22 MED ORDER — CEFAZOLIN SODIUM-DEXTROSE 2-4 GM/100ML-% IV SOLN
2.0000 g | Freq: Once | INTRAVENOUS | Status: AC
  Administered 2017-02-22: 2 g via INTRAVENOUS

## 2017-02-22 MED ORDER — CEFAZOLIN SODIUM-DEXTROSE 2-4 GM/100ML-% IV SOLN
INTRAVENOUS | Status: AC
  Filled 2017-02-22: qty 100

## 2017-02-22 MED ORDER — TETANUS-DIPHTH-ACELL PERTUSSIS 5-2.5-18.5 LF-MCG/0.5 IM SUSP
INTRAMUSCULAR | Status: AC
  Filled 2017-02-22: qty 0.5

## 2017-02-22 MED ORDER — SODIUM CHLORIDE 0.9 % IV SOLN
INTRAVENOUS | Status: DC | PRN
  Administered 2017-02-22: 500 mL via INTRAVENOUS

## 2017-02-22 MED ORDER — DOCUSATE SODIUM 100 MG PO CAPS
100.0000 mg | ORAL_CAPSULE | Freq: Two times a day (BID) | ORAL | Status: DC
  Administered 2017-02-24 – 2017-03-16 (×38): 100 mg via ORAL
  Filled 2017-02-22 (×44): qty 1

## 2017-02-22 MED ORDER — PROPOFOL 10 MG/ML IV BOLUS
INTRAVENOUS | Status: DC | PRN
  Administered 2017-02-22: 120 mg via INTRAVENOUS

## 2017-02-22 MED ORDER — SODIUM CHLORIDE 0.9 % IV SOLN
INTRAVENOUS | Status: DC
  Administered 2017-02-22: 23:00:00 via INTRAVENOUS

## 2017-02-22 MED ORDER — CEFAZOLIN SODIUM-DEXTROSE 1-4 GM/50ML-% IV SOLN
1.0000 g | Freq: Three times a day (TID) | INTRAVENOUS | Status: DC
  Administered 2017-02-23 – 2017-02-28 (×16): 1 g via INTRAVENOUS
  Filled 2017-02-22 (×18): qty 50

## 2017-02-22 MED ORDER — LIDOCAINE 2% (20 MG/ML) 5 ML SYRINGE
INTRAMUSCULAR | Status: AC
Start: 2017-02-22 — End: ?
  Filled 2017-02-22: qty 25

## 2017-02-22 MED ORDER — POTASSIUM CHLORIDE IN NACL 20-0.9 MEQ/L-% IV SOLN
INTRAVENOUS | Status: DC
  Administered 2017-02-23 – 2017-02-28 (×9): via INTRAVENOUS
  Filled 2017-02-22 (×11): qty 1000

## 2017-02-22 MED ORDER — HYDROMORPHONE HCL 1 MG/ML IJ SOLN
0.5000 mg | INTRAMUSCULAR | Status: DC | PRN
  Administered 2017-02-23 (×3): 0.5 mg via INTRAVENOUS
  Administered 2017-02-23: 1 mg via INTRAVENOUS
  Administered 2017-02-23 – 2017-02-24 (×4): 0.5 mg via INTRAVENOUS
  Administered 2017-02-24 (×2): 1 mg via INTRAVENOUS
  Administered 2017-02-24 (×2): 0.5 mg via INTRAVENOUS
  Administered 2017-02-25: 1 mg via INTRAVENOUS
  Administered 2017-02-25: 0.5 mg via INTRAVENOUS
  Administered 2017-02-25: 1 mg via INTRAVENOUS
  Administered 2017-02-25 (×2): 0.5 mg via INTRAVENOUS
  Administered 2017-02-25 – 2017-03-04 (×23): 1 mg via INTRAVENOUS
  Filled 2017-02-22: qty 0.5
  Filled 2017-02-22 (×2): qty 1
  Filled 2017-02-22 (×2): qty 0.5
  Filled 2017-02-22 (×5): qty 1
  Filled 2017-02-22 (×2): qty 0.5
  Filled 2017-02-22 (×2): qty 1
  Filled 2017-02-22: qty 0.5
  Filled 2017-02-22 (×2): qty 1
  Filled 2017-02-22: qty 0.5
  Filled 2017-02-22 (×21): qty 1
  Filled 2017-02-22: qty 0.5
  Filled 2017-02-22: qty 1

## 2017-02-22 MED ORDER — SUCCINYLCHOLINE CHLORIDE 200 MG/10ML IV SOSY
PREFILLED_SYRINGE | INTRAVENOUS | Status: AC
  Filled 2017-02-22: qty 20

## 2017-02-22 MED ORDER — IOPAMIDOL (ISOVUE-300) INJECTION 61%
INTRAVENOUS | Status: AC
  Administered 2017-02-22: 100 mL
  Filled 2017-02-22: qty 100

## 2017-02-22 MED ORDER — PROCHLORPERAZINE MALEATE 10 MG PO TABS
10.0000 mg | ORAL_TABLET | Freq: Four times a day (QID) | ORAL | Status: DC | PRN
  Filled 2017-02-22: qty 1

## 2017-02-22 MED ORDER — ONDANSETRON HCL 4 MG/2ML IJ SOLN
4.0000 mg | Freq: Four times a day (QID) | INTRAMUSCULAR | Status: DC | PRN
  Filled 2017-02-22: qty 2

## 2017-02-22 MED ORDER — PROPOFOL 10 MG/ML IV BOLUS
INTRAVENOUS | Status: AC
  Filled 2017-02-22: qty 20

## 2017-02-22 MED ORDER — LIDOCAINE HCL (CARDIAC) 20 MG/ML IV SOLN
INTRAVENOUS | Status: DC | PRN
  Administered 2017-02-22: 100 mg via INTRATRACHEAL

## 2017-02-22 MED ORDER — AMPHETAMINE-DEXTROAMPHET ER 20 MG PO CP24: 20.0000 mg | ORAL_CAPSULE | Freq: Every day | ORAL | Status: DC

## 2017-02-22 MED ORDER — TETANUS-DIPHTH-ACELL PERTUSSIS 5-2.5-18.5 LF-MCG/0.5 IM SUSP
0.5000 mL | Freq: Once | INTRAMUSCULAR | Status: AC
  Administered 2017-02-22: 0.5 mL via INTRAMUSCULAR

## 2017-02-22 MED ORDER — PROCHLORPERAZINE EDISYLATE 5 MG/ML IJ SOLN
5.0000 mg | Freq: Four times a day (QID) | INTRAMUSCULAR | Status: DC | PRN
  Filled 2017-02-22: qty 2

## 2017-02-22 MED ORDER — PANTOPRAZOLE SODIUM 40 MG IV SOLR
40.0000 mg | Freq: Every day | INTRAVENOUS | Status: DC
  Administered 2017-02-23: 40 mg via INTRAVENOUS
  Filled 2017-02-22 (×2): qty 40

## 2017-02-22 MED ORDER — ONDANSETRON 4 MG PO TBDP
4.0000 mg | ORAL_TABLET | Freq: Four times a day (QID) | ORAL | Status: DC | PRN
  Filled 2017-02-22: qty 1

## 2017-02-22 MED ORDER — FENTANYL CITRATE (PF) 100 MCG/2ML IJ SOLN
INTRAMUSCULAR | Status: DC | PRN
  Administered 2017-02-22: 50 ug via INTRAVENOUS

## 2017-02-22 MED ORDER — BUPROPION HCL ER (XL) 300 MG PO TB24
450.0000 mg | ORAL_TABLET | Freq: Every day | ORAL | Status: DC
  Administered 2017-02-24 – 2017-03-02 (×6): 450 mg via ORAL
  Filled 2017-02-22 (×3): qty 1
  Filled 2017-02-22: qty 3
  Filled 2017-02-22: qty 1
  Filled 2017-02-22: qty 3
  Filled 2017-02-22 (×2): qty 1

## 2017-02-22 MED ORDER — PANTOPRAZOLE SODIUM 40 MG PO TBEC
40.0000 mg | DELAYED_RELEASE_TABLET | Freq: Every day | ORAL | Status: DC
  Administered 2017-02-24 – 2017-03-16 (×19): 40 mg via ORAL
  Filled 2017-02-22 (×20): qty 1

## 2017-02-22 MED ORDER — MIDAZOLAM HCL 2 MG/2ML IJ SOLN
INTRAMUSCULAR | Status: AC
  Filled 2017-02-22: qty 2

## 2017-02-22 MED ORDER — FENTANYL CITRATE (PF) 250 MCG/5ML IJ SOLN
INTRAMUSCULAR | Status: AC
  Filled 2017-02-22: qty 5

## 2017-02-22 MED ORDER — SODIUM CHLORIDE 0.9 % IV BOLUS (SEPSIS): 2000.0000 mL | Freq: Once | INTRAVENOUS | Status: DC

## 2017-02-22 SURGICAL SUPPLY — 55 items
BAG DECANTER FOR FLEXI CONT (MISCELLANEOUS) IMPLANT
BANDAGE ACE 4X5 VEL STRL LF (GAUZE/BANDAGES/DRESSINGS) ×3 IMPLANT
BANDAGE ACE 6X5 VEL STRL LF (GAUZE/BANDAGES/DRESSINGS) ×3 IMPLANT
BAR EXFX 400X11 NS LF (EXFIX) ×2
BAR EXFX 500X11 NS LF (EXFIX) ×2
BAR GLASS FIBER EXFX 11X400 (EXFIX) ×1 IMPLANT
BAR GLASS FIBER EXFX 11X500 (EXFIX) ×1 IMPLANT
BLADE SURG 21 STRL SS (BLADE) ×3 IMPLANT
BNDG COHESIVE 6X5 TAN STRL LF (GAUZE/BANDAGES/DRESSINGS) IMPLANT
BNDG GAUZE ELAST 4 BULKY (GAUZE/BANDAGES/DRESSINGS) ×6 IMPLANT
CLAMP PIN 45MM 1 BAR (EXFIX) ×4 IMPLANT
COVER SURGICAL LIGHT HANDLE (MISCELLANEOUS) ×5 IMPLANT
DRAPE HALF SHEET 40X57 (DRAPES) ×6 IMPLANT
DRAPE INCISE IOBAN 66X45 STRL (DRAPES) IMPLANT
DRAPE OEC MINIVIEW 54X84 (DRAPES) IMPLANT
DRAPE U-SHAPE 47X51 STRL (DRAPES) ×4 IMPLANT
DRSG ADAPTIC 3X8 NADH LF (GAUZE/BANDAGES/DRESSINGS) ×2 IMPLANT
DRSG PAD ABDOMINAL 8X10 ST (GAUZE/BANDAGES/DRESSINGS) ×3 IMPLANT
DURAPREP 26ML APPLICATOR (WOUND CARE) ×2 IMPLANT
ELECT REM PT RETURN 9FT ADLT (ELECTROSURGICAL) ×3
ELECTRODE REM PT RTRN 9FT ADLT (ELECTROSURGICAL) ×2 IMPLANT
GAUZE SPONGE 4X4 12PLY STRL (GAUZE/BANDAGES/DRESSINGS) ×6 IMPLANT
GAUZE XEROFORM 5X9 LF (GAUZE/BANDAGES/DRESSINGS) ×2 IMPLANT
GLOVE BIOGEL PI IND STRL 9 (GLOVE) ×2 IMPLANT
GLOVE BIOGEL PI INDICATOR 9 (GLOVE) ×2
GLOVE SURG ORTHO 9.0 STRL STRW (GLOVE) ×4 IMPLANT
GOWN STRL REUS W/ TWL XL LVL3 (GOWN DISPOSABLE) ×6 IMPLANT
GOWN STRL REUS W/TWL XL LVL3 (GOWN DISPOSABLE) ×9
HALF PIN 5.0X160 (EXFIX) ×4 IMPLANT
HANDPIECE INTERPULSE COAX TIP (DISPOSABLE) ×3
KIT BASIN OR (CUSTOM PROCEDURE TRAY) ×3 IMPLANT
KIT ROOM TURNOVER OR (KITS) ×3 IMPLANT
MANIFOLD NEPTUNE II (INSTRUMENTS) ×3 IMPLANT
NS IRRIG 1000ML POUR BTL (IV SOLUTION) ×3 IMPLANT
PACK ORTHO EXTREMITY (CUSTOM PROCEDURE TRAY) ×3 IMPLANT
PAD ARMBOARD 7.5X6 YLW CONV (MISCELLANEOUS) ×6 IMPLANT
PAD CAST 4YDX4 CTTN HI CHSV (CAST SUPPLIES) ×2 IMPLANT
PADDING CAST COTTON 4X4 STRL (CAST SUPPLIES) ×6
PENCIL BUTTON HOLSTER BLD 10FT (ELECTRODE) ×3 IMPLANT
PIN HALF YELLOW 5X160X35 (EXFIX) ×4 IMPLANT
SET HNDPC FAN SPRY TIP SCT (DISPOSABLE) IMPLANT
STOCKINETTE 6  STRL (DRAPES) ×2
STOCKINETTE 6 STRL (DRAPES) ×2 IMPLANT
STOCKINETTE IMPERVIOUS 9X36 MD (GAUZE/BANDAGES/DRESSINGS) ×2 IMPLANT
SUCTION FRAZIER HANDLE 10FR (MISCELLANEOUS) ×1
SUCTION TUBE FRAZIER 10FR DISP (MISCELLANEOUS) ×2 IMPLANT
SUT ETHILON 3 0 FSLX (SUTURE) ×3 IMPLANT
SUT VIC AB 2-0 CTB1 (SUTURE) ×3 IMPLANT
SWAB COLLECTION DEVICE MRSA (MISCELLANEOUS) ×2 IMPLANT
SWAB CULTURE ESWAB REG 1ML (MISCELLANEOUS) IMPLANT
TOWEL OR 17X24 6PK STRL BLUE (TOWEL DISPOSABLE) ×3 IMPLANT
TOWEL OR 17X26 10 PK STRL BLUE (TOWEL DISPOSABLE) ×4 IMPLANT
TUBE CONNECTING 12X1/4 (SUCTIONS) ×3 IMPLANT
WATER STERILE IRR 1000ML POUR (IV SOLUTION) ×2 IMPLANT
YANKAUER SUCT BULB TIP NO VENT (SUCTIONS) ×3 IMPLANT

## 2017-02-22 NOTE — Progress Notes (Signed)
Orthopedic Tech Progress Note Patient Details:  Deborah MunroeUna [name] 04/11/1951 [id number]  Ortho Devices Type of Ortho Device: Ace wrap Ortho Device/Splint Location: Bilateral long leg splint Ortho Device/Splint Interventions: Application   Saul FordyceJennifer C Kitti Mcclish 02/22/2017, 10:16 PM

## 2017-02-22 NOTE — ED Notes (Signed)
Rings being cutting off at this time
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
## 2017-02-22 NOTE — Anesthesia Preprocedure Evaluation (Addendum)
Anesthesia Evaluation  Patient identified by MRN, date of birth, ID band Patient awake    Reviewed: Allergy & Precautions, NPO status , Patient's Chart, lab work & pertinent test results  Airway Mallampati: II   Neck ROM: Limited   Comment: C-Collar, facial lac Dental  (+) Loose   Pulmonary neg pulmonary ROS, Current Smoker,    breath sounds clear to auscultation       Cardiovascular negative cardio ROS   Rhythm:Regular Rate:Normal     Neuro/Psych negative neurological ROS  negative psych ROS   GI/Hepatic negative GI ROS, Neg liver ROS,   Endo/Other  negative endocrine ROS  Renal/GU negative Renal ROS  negative genitourinary   Musculoskeletal negative musculoskeletal ROS (+)   Abdominal   Peds negative pediatric ROS (+)  Hematology negative hematology ROS (+)   Anesthesia Other Findings   Reproductive/Obstetrics negative OB ROS                             Anesthesia Physical Anesthesia Plan  ASA: II and emergent  Anesthesia Plan: General   Post-op Pain Management:    Induction: Intravenous  PONV Risk Score and Plan: 2 and Ondansetron and Dexamethasone  Airway Management Planned: Video Laryngoscope Planned and Oral ETT  Additional Equipment:   Intra-op Plan:   Post-operative Plan: Extubation in OR  Informed Consent: I have reviewed the patients History and Physical, chart, labs and discussed the procedure including the risks, benefits and alternatives for the proposed anesthesia with the patient or authorized representative who has indicated his/her understanding and acceptance.   Dental advisory given  Plan Discussed with: CRNA  Anesthesia Plan Comments:        Anesthesia Quick Evaluation

## 2017-02-22 NOTE — ED Notes (Signed)
Pt back to TRA B at this time, will continue to monitor

## 2017-02-22 NOTE — ED Notes (Signed)
bilat splints applied to BLE by ortho techs
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
## 2017-02-22 NOTE — ED Notes (Signed)
OR ready for patient at this time;  Sister at bedside signed consent for surgery

## 2017-02-22 NOTE — ED Notes (Signed)
Pt reporting her name is Deborah [name], not Maebelle MunroeUna Ludden; pt has sisters ID

## 2017-02-22 NOTE — ED Notes (Signed)
Pt to CT2 with RN Huntley DecSara

## 2017-02-22 NOTE — ED Notes (Signed)
Abrasion to R hip Open L tib/fib Open R tib/fib

## 2017-02-22 NOTE — ED Notes (Signed)
Duda MD at bedside. 

## 2017-02-23 ENCOUNTER — Inpatient Hospital Stay (HOSPITAL_COMMUNITY): Payer: No Typology Code available for payment source

## 2017-02-23 ENCOUNTER — Encounter (HOSPITAL_COMMUNITY): Payer: Self-pay | Admitting: Orthopedic Surgery

## 2017-02-23 DIAGNOSIS — S82201C Unspecified fracture of shaft of right tibia, initial encounter for open fracture type IIIA, IIIB, or IIIC: Secondary | ICD-10-CM

## 2017-02-23 DIAGNOSIS — S82202C Unspecified fracture of shaft of left tibia, initial encounter for open fracture type IIIA, IIIB, or IIIC: Secondary | ICD-10-CM

## 2017-02-23 LAB — BPAM RBC
Blood Product Expiration Date: 201807312359
Blood Product Expiration Date: 201807312359
ISSUE DATE / TIME: 201807260647
ISSUE DATE / TIME: 201807260647
Unit Type and Rh: 9500
Unit Type and Rh: 9500

## 2017-02-23 LAB — BASIC METABOLIC PANEL
ANION GAP: 6 (ref 5–15)
BUN: 17 mg/dL (ref 6–20)
CALCIUM: 7.5 mg/dL — AB (ref 8.9–10.3)
CO2: 21 mmol/L — AB (ref 22–32)
CREATININE: 0.74 mg/dL (ref 0.44–1.00)
Chloride: 113 mmol/L — ABNORMAL HIGH (ref 101–111)
GFR calc Af Amer: >60 mL/min (ref >60)
GFR calc non Af Amer: >60 mL/min (ref >60)
GLUCOSE: 165 mg/dL — AB (ref 65–99)
Potassium: 3.8 mmol/L (ref 3.5–5.1)
Sodium: 140 mmol/L (ref 135–145)

## 2017-02-23 LAB — CBC
HEMATOCRIT: 23.5 % — AB (ref 36.0–46.0)
Hemoglobin: 7.9 g/dL — ABNORMAL LOW (ref 12.0–15.0)
MCH: 30.9 pg (ref 26.0–34.0)
MCHC: 33.6 g/dL (ref 30.0–36.0)
MCV: 91.8 fL (ref 78.0–100.0)
Platelets: 204 10*3/uL (ref 150–400)
RBC: 2.56 MIL/uL — ABNORMAL LOW (ref 3.87–5.11)
RDW: 12.6 % (ref 11.5–15.5)
WBC: 12.2 10*3/uL — ABNORMAL HIGH (ref 4.0–10.5)

## 2017-02-23 LAB — POCT I-STAT 4, (NA,K, GLUC, HGB,HCT)
Glucose, Bld: 149 mg/dL — ABNORMAL HIGH (ref 65–99)
HCT: 21 % — ABNORMAL LOW (ref 36.0–46.0)
Hemoglobin: 7.1 g/dL — ABNORMAL LOW (ref 12.0–15.0)
Potassium: 4 mmol/L (ref 3.5–5.1)
Sodium: 143 mmol/L (ref 135–145)

## 2017-02-23 LAB — TYPE AND SCREEN
ABO/RH(D): O POS
Antibody Screen: NEGATIVE
UNIT DIVISION: 0
Unit division: 0

## 2017-02-23 LAB — GLUCOSE, CAPILLARY
Glucose-Capillary: 161 mg/dL — ABNORMAL HIGH (ref 65–99)
Glucose-Capillary: 163 mg/dL — ABNORMAL HIGH (ref 65–99)
Glucose-Capillary: 157 mg/dL — ABNORMAL HIGH (ref 65–99)

## 2017-02-23 LAB — HEMOGLOBIN AND HEMATOCRIT, BLOOD
HEMATOCRIT: 28.9 % — AB (ref 36.0–46.0)
HEMOGLOBIN: 9.8 g/dL — AB (ref 12.0–15.0)

## 2017-02-23 LAB — PREPARE RBC (CROSSMATCH)

## 2017-02-23 LAB — ABO/RH
ABO/RH(D): O POS
ABO/RH(D): O POS

## 2017-02-23 LAB — HIV ANTIBODY (ROUTINE TESTING W REFLEX): HIV Screen 4th Generation wRfx: NONREACTIVE

## 2017-02-23 LAB — BLOOD PRODUCT ORDER (VERBAL) VERIFICATION

## 2017-02-23 LAB — MRSA PCR SCREENING: MRSA by PCR: NEGATIVE

## 2017-02-23 MED ORDER — ENOXAPARIN SODIUM 40 MG/0.4ML ~~LOC~~ SOLN: 40.0000 mg | SUBCUTANEOUS | Status: DC

## 2017-02-23 MED ORDER — HYDROMORPHONE HCL 1 MG/ML IJ SOLN
INTRAMUSCULAR | Status: AC
  Filled 2017-02-23: qty 1

## 2017-02-23 MED ORDER — PHENYLEPHRINE HCL 10 MG/ML IJ SOLN
INTRAMUSCULAR | Status: DC | PRN
  Administered 2017-02-22 (×2): 120 ug via INTRAVENOUS
  Administered 2017-02-22 (×2): 80 ug via INTRAVENOUS

## 2017-02-23 MED ORDER — FENTANYL CITRATE (PF) 250 MCG/5ML IJ SOLN
INTRAMUSCULAR | Status: AC
  Filled 2017-02-23: qty 5

## 2017-02-23 MED ORDER — FENTANYL CITRATE (PF) 100 MCG/2ML IJ SOLN
INTRAMUSCULAR | Status: DC | PRN
  Administered 2017-02-22: 50 ug via INTRAVENOUS

## 2017-02-23 MED ORDER — MIDAZOLAM HCL 2 MG/2ML IJ SOLN
INTRAMUSCULAR | Status: AC
  Filled 2017-02-23: qty 2

## 2017-02-23 MED ORDER — SODIUM CHLORIDE 0.9 % IV SOLN
10.0000 mL/h | Freq: Once | INTRAVENOUS | Status: AC
  Administered 2017-02-23: 10 mL/h via INTRAVENOUS

## 2017-02-23 MED ORDER — BOOST / RESOURCE BREEZE PO LIQD
1.0000 | Freq: Three times a day (TID) | ORAL | Status: DC
  Administered 2017-02-25 – 2017-02-28 (×7): 1 via ORAL

## 2017-02-23 MED ORDER — SODIUM CHLORIDE 0.9 % IV SOLN
INTRAVENOUS | Status: DC | PRN
  Administered 2017-02-22: via INTRAVENOUS

## 2017-02-23 MED ORDER — LACTATED RINGERS IV SOLN
INTRAVENOUS | Status: DC | PRN
  Administered 2017-02-23: via INTRAVENOUS

## 2017-02-23 MED ORDER — HYDROMORPHONE HCL 1 MG/ML IJ SOLN
0.2500 mg | INTRAMUSCULAR | Status: DC | PRN
  Administered 2017-02-23: 0.25 mg via INTRAVENOUS

## 2017-02-23 MED ORDER — PROMETHAZINE HCL 25 MG/ML IJ SOLN: 6.2500 mg | INTRAMUSCULAR | Status: DC | PRN

## 2017-02-23 MED ORDER — BACITRACIN-NEOMYCIN-POLYMYXIN 400-5-5000 EX OINT
TOPICAL_OINTMENT | CUTANEOUS | Status: AC
  Filled 2017-02-23: qty 1

## 2017-02-23 MED ORDER — SODIUM CHLORIDE 0.9 % IV SOLN: Freq: Once | INTRAVENOUS | Status: DC

## 2017-02-23 MED ORDER — MEPERIDINE HCL 25 MG/ML IJ SOLN: 6.2500 mg | INTRAMUSCULAR | Status: DC | PRN

## 2017-02-23 MED ORDER — MIDAZOLAM HCL 2 MG/2ML IJ SOLN
INTRAMUSCULAR | Status: DC | PRN
  Administered 2017-02-22: 2 mg via INTRAVENOUS

## 2017-02-23 MED ORDER — LIDOCAINE 2% (20 MG/ML) 5 ML SYRINGE
INTRAMUSCULAR | Status: AC
  Filled 2017-02-23: qty 25

## 2017-02-23 MED ORDER — SUCCINYLCHOLINE CHLORIDE 200 MG/10ML IV SOSY
PREFILLED_SYRINGE | INTRAVENOUS | Status: AC
  Filled 2017-02-23: qty 20

## 2017-02-23 MED ORDER — PHENYLEPHRINE HCL 10 MG/ML IJ SOLN
INTRAVENOUS | Status: DC | PRN
  Administered 2017-02-23: 50 ug/min via INTRAVENOUS

## 2017-02-23 MED ORDER — PROPOFOL 10 MG/ML IV BOLUS
INTRAVENOUS | Status: AC
  Filled 2017-02-23: qty 20

## 2017-02-23 MED ORDER — AMPHETAMINE-DEXTROAMPHET ER 10 MG PO CP24
20.0000 mg | ORAL_CAPSULE | Freq: Every day | ORAL | Status: DC
  Administered 2017-02-24 – 2017-02-26 (×3): 20 mg via ORAL
  Filled 2017-02-23 (×4): qty 2

## 2017-02-23 NOTE — Progress Notes (Signed)
Pt has two working PIV in both arms. The 18g in the R arm is running fluids at 15300ml/hr with out any signs of infiltration or irritation. The PIV is wrapped in gauze for stabilization however the insertion site can been visualized. I advised the nurse that if her IV's stop working to contact the IV team further assessment. Deborah [name], Deborah [name] M

## 2017-02-23 NOTE — Anesthesia Procedure Notes (Signed)
Procedure Name: Intubation Date/Time: 02/22/2017 11:43 PM Performed by: Molli HazardGORDON, Inioluwa Baris M Pre-anesthesia Checklist: Patient identified, Emergency Drugs available, Suction available and Patient being monitored Patient Re-evaluated:Patient Re-evaluated prior to induction Oxygen Delivery Method: Circle system utilized Preoxygenation: Pre-oxygenation with 100% oxygen Induction Type: IV induction and Rapid sequence Laryngoscope Size: Glidescope Grade View: Grade I Tube size: 7.5 mm Number of attempts: 1 Airway Equipment and Method: Stylet Placement Confirmation: ETT inserted through vocal cords under direct vision,  positive ETCO2 and breath sounds checked- equal and bilateral Secured at: 23 cm Tube secured with: Tape Dental Injury: Teeth and Oropharynx as per pre-operative assessment  Comments: Cervical collar remained on during induction and intubation.

## 2017-02-23 NOTE — ED Notes (Signed)
Pt to CT2 with RN Joanna Hall 
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
## 2017-02-23 NOTE — Progress Notes (Signed)
Patient ID: Deborah GrinderBernadette M Woods, female   DOB: 1956-05-09, 61 y.o.   MRN: [id number]   LOS: 1 day   Subjective: Sedated but following commands. Pain controlled.   Objective: Vital signs in last 24 hours: Temp:  [96.7 F (35.9 C)-98.1 F (36.7 C)] 98 F (36.7 C) (07/26 1139) Pulse Rate:  [90-104] 102 (07/26 1030) Resp:  [12-24] 15 (07/26 1030) BP: (65-152)/(53-99) 114/77 (07/26 1000) SpO2:  [95 %-100 %] 96 % (07/26 1030) Weight:  [47.2 kg (104 lb)-47.6 kg (105 lb)] 47.6 kg (105 lb) (07/25 2208) Last BM Date: 02/22/17   Laboratory  CBC  Recent Labs  02/23/17 0132 02/23/17 1044  WBC 12.2*  --   HGB 7.9* 9.8*  HCT 23.5* 28.9*  PLT 204  --    BMET  Recent Labs  02/22/17 2138 02/23/17 0022 02/23/17 0132  NA 142 143 140  K 4.0 4.0 3.8  CL 107  --  113*  CO2  --   --  21*  GLUCOSE 133* 149* 165*  BUN 19  --  17  CREATININE 0.70  --  0.74  CALCIUM  --   --  7.5*     Physical Exam General appearance: no distress  BLE Ex fix's in place   Sens DPN, SPN, TN intact  Motor EHL, ext, flex, evers intact  DP 2+, PT 1+, No significant edema      Assessment/Plan: Bilateral open tibia plateau fxs -- For repeat I&D tomorrow, possible repair of one or both but not likely. Will be NWB BLE x8 weeks from eventual ORIF.  Multiple injuries -- per primary service    Freeman CaldronMichael J. Morissa Obeirne, PA-C Orthopedic Surgery [phone] 02/23/2017
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
## 2017-02-23 NOTE — ED Notes (Addendum)
50mcg fentanyl IV ordered by Megan MansWyatt, J MD Given by Daisy BlossomSara W RN

## 2017-02-23 NOTE — ED Notes (Signed)
PT/INR, CDS, CMP, CBC, Ethanol collected by Jarrett AblesLindsay Duncan, phlebotomist

## 2017-02-23 NOTE — Progress Notes (Signed)
Patient came up from PACU with blood infusing.  Two units were ordered verbally but the way that the order was put in it would only let me infuse one unit.  The second unit was returned to the blood bank.  Dr. Lindie SpruceWyatt was notified about this and ordered a new HH.  We will continue to monitor the patient.

## 2017-02-23 NOTE — Progress Notes (Signed)
Dr. Lindie SpruceWyatt notified of patient's low BP.  Order received to tranfuse two units of PRBC.  Will continue to monitor.
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
## 2017-02-23 NOTE — Transfer of Care (Signed)
Immediate Anesthesia Transfer of Care Note  Patient: Darrick GrinderBernadette M Sciara  Procedure(s) Performed: Procedure(s): IRRIGATION AND DEBRIDEMENT BILATERL LOWER EXTREMITIES (Bilateral) EXTERNAL FIXATION LEFT LOWER LEG (Bilateral) FACIAL LACERATION REPAIR (N/A)  Patient Location: PACU  Anesthesia Type:General  Level of Consciousness: awake, alert  and oriented  Airway & Oxygen Therapy: Patient connected to nasal cannula oxygen  Post-op Assessment: Report given to RN, Post -op Vital signs reviewed and stable and Patient moving all extremities X 4  Post vital signs: Reviewed and stable  Last Vitals:  Vitals:   02/22/17 2129 02/23/17 0115  BP:  (!) (P) 74/59  Pulse:  (!) (P) 101  Resp: (!) 24 (!) (P) 22  Temp:  (!) (P) 36.2 C    Last Pain: There were no vitals filed for this visit.       Complications: No apparent anesthesia complications
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
## 2017-02-23 NOTE — ED Notes (Signed)
Rings being cut off at this time

## 2017-02-23 NOTE — ED Notes (Signed)
500 mL NS bolus IV ordered by Megan MansWyatt, J MD Given by Rosalyn Gessallie S RN
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
## 2017-02-23 NOTE — ED Notes (Signed)
Abrasion to R hip Open L tib/fib frx Open R tib/fib frx

## 2017-02-23 NOTE — ED Notes (Signed)
Blood bank cooler arrives to TRA B

## 2017-02-23 NOTE — ED Notes (Signed)
2g Ancef 21800mL/hr IV ordered by Cheri RousYao, D MD Given by Rosalyn Gessallie S RN
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
## 2017-02-23 NOTE — ED Notes (Signed)
istat chem 8, blood bank sample, istat lactic acid collected by Jarrett AblesLindsay Duncan, phlebotomist

## 2017-02-23 NOTE — ED Notes (Signed)
tdap 0.795mL IM L Deltoid ordered by Cheri RousYao, D MD Given by Rosalyn Gessallie S RN
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
## 2017-02-23 NOTE — ED Notes (Signed)
2g Ancef IV complete
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
## 2017-02-24 ENCOUNTER — Inpatient Hospital Stay (HOSPITAL_COMMUNITY): Payer: No Typology Code available for payment source | Admitting: Anesthesiology

## 2017-02-24 ENCOUNTER — Encounter (HOSPITAL_COMMUNITY): Payer: Self-pay | Admitting: Orthopedic Surgery

## 2017-02-24 ENCOUNTER — Encounter (HOSPITAL_COMMUNITY): Admission: EM | Disposition: A | Discharge status: Skilled Nursing Facility | Payer: Self-pay | Source: Home / Self Care

## 2017-02-24 ENCOUNTER — Inpatient Hospital Stay (HOSPITAL_COMMUNITY): Payer: No Typology Code available for payment source

## 2017-02-24 HISTORY — PX: ORIF TIBIA PLATEAU: SHX2132

## 2017-02-24 HISTORY — PX: I & D EXTREMITY: SHX5045

## 2017-02-24 LAB — BASIC METABOLIC PANEL
Anion gap: 4 — ABNORMAL LOW (ref 5–15)
BUN: 13 mg/dL (ref 6–20)
CALCIUM: 7.5 mg/dL — AB (ref 8.9–10.3)
CHLORIDE: 111 mmol/L (ref 101–111)
CO2: 24 mmol/L (ref 22–32)
CREATININE: 0.54 mg/dL (ref 0.44–1.00)
GFR calc Af Amer: >60 mL/min (ref >60)
GFR calc non Af Amer: >60 mL/min (ref >60)
GLUCOSE: 129 mg/dL — AB (ref 65–99)
Potassium: 4.6 mmol/L (ref 3.5–5.1)
Sodium: 139 mmol/L (ref 135–145)

## 2017-02-24 LAB — CBC
HCT: 22.8 % — ABNORMAL LOW (ref 36.0–46.0)
Hemoglobin: 7.6 g/dL — ABNORMAL LOW (ref 12.0–15.0)
MCH: 30.3 pg (ref 26.0–34.0)
MCHC: 33.3 g/dL (ref 30.0–36.0)
MCV: 90.8 fL (ref 78.0–100.0)
PLATELETS: 159 10*3/uL (ref 150–400)
RBC: 2.51 MIL/uL — ABNORMAL LOW (ref 3.87–5.11)
RDW: 13.6 % (ref 11.5–15.5)
WBC: 8.5 10*3/uL (ref 4.0–10.5)

## 2017-02-24 LAB — PREPARE RBC (CROSSMATCH)

## 2017-02-24 SURGERY — IRRIGATION AND DEBRIDEMENT EXTREMITY
Anesthesia: General | Site: Leg Lower | Laterality: Left

## 2017-02-24 MED ORDER — 0.9 % SODIUM CHLORIDE (POUR BTL) OPTIME
TOPICAL | Status: DC | PRN
  Administered 2017-02-24: 1000 mL

## 2017-02-24 MED ORDER — ONDANSETRON HCL 4 MG/2ML IJ SOLN
INTRAMUSCULAR | Status: DC | PRN
  Administered 2017-02-24: 4 mg via INTRAVENOUS

## 2017-02-24 MED ORDER — ROCURONIUM BROMIDE 10 MG/ML (PF) SYRINGE
PREFILLED_SYRINGE | INTRAVENOUS | Status: AC
  Filled 2017-02-24: qty 5

## 2017-02-24 MED ORDER — SUGAMMADEX SODIUM 200 MG/2ML IV SOLN
INTRAVENOUS | Status: DC | PRN
Start: 2017-02-24 — End: 2017-02-24
  Administered 2017-02-24: 100 mg via INTRAVENOUS

## 2017-02-24 MED ORDER — PROPOFOL 10 MG/ML IV BOLUS
INTRAVENOUS | Status: DC | PRN
  Administered 2017-02-24: 100 mg via INTRAVENOUS
  Administered 2017-02-24: 50 mg via INTRAVENOUS

## 2017-02-24 MED ORDER — MIDAZOLAM HCL 2 MG/2ML IJ SOLN
INTRAMUSCULAR | Status: AC
  Filled 2017-02-24: qty 2

## 2017-02-24 MED ORDER — MIDAZOLAM HCL 5 MG/5ML IJ SOLN
INTRAMUSCULAR | Status: DC | PRN
  Administered 2017-02-24: 1 mg via INTRAVENOUS

## 2017-02-24 MED ORDER — ONDANSETRON HCL 4 MG/2ML IJ SOLN
INTRAMUSCULAR | Status: AC
  Filled 2017-02-24: qty 2

## 2017-02-24 MED ORDER — GENTAMICIN SULFATE 40 MG/ML IJ SOLN
INTRAMUSCULAR | Status: DC | PRN
  Administered 2017-02-24: 160 mg

## 2017-02-24 MED ORDER — FENTANYL CITRATE (PF) 250 MCG/5ML IJ SOLN
INTRAMUSCULAR | Status: AC
  Filled 2017-02-24: qty 5

## 2017-02-24 MED ORDER — VANCOMYCIN HCL 500 MG IV SOLR
INTRAVENOUS | Status: DC | PRN
  Administered 2017-02-24: 500 mg

## 2017-02-24 MED ORDER — SODIUM CHLORIDE 0.9 % IR SOLN
Status: DC | PRN
  Administered 2017-02-24 (×2): 3000 mL

## 2017-02-24 MED ORDER — DEXAMETHASONE SODIUM PHOSPHATE 10 MG/ML IJ SOLN
INTRAMUSCULAR | Status: DC | PRN
  Administered 2017-02-24: 10 mg via INTRAVENOUS

## 2017-02-24 MED ORDER — DEXAMETHASONE SODIUM PHOSPHATE 10 MG/ML IJ SOLN
INTRAMUSCULAR | Status: AC
  Filled 2017-02-24: qty 1

## 2017-02-24 MED ORDER — ALBUMIN HUMAN 5 % IV SOLN
INTRAVENOUS | Status: DC | PRN
  Administered 2017-02-24: 14:00:00 via INTRAVENOUS

## 2017-02-24 MED ORDER — LIDOCAINE HCL (CARDIAC) 20 MG/ML IV SOLN
INTRAVENOUS | Status: DC | PRN
  Administered 2017-02-24: 70 mg via INTRAVENOUS

## 2017-02-24 MED ORDER — LACTATED RINGERS IV SOLN
INTRAVENOUS | Status: DC | PRN
  Administered 2017-02-24 (×2): via INTRAVENOUS

## 2017-02-24 MED ORDER — GENTAMICIN SULFATE 40 MG/ML IJ SOLN
INTRAMUSCULAR | Status: AC
  Filled 2017-02-24: qty 8

## 2017-02-24 MED ORDER — SUCCINYLCHOLINE CHLORIDE 200 MG/10ML IV SOSY
PREFILLED_SYRINGE | INTRAVENOUS | Status: AC
  Filled 2017-02-24: qty 10

## 2017-02-24 MED ORDER — BREXPIPRAZOLE 2 MG PO TABS
2.0000 mg | ORAL_TABLET | Freq: Every day | ORAL | Status: DC
  Administered 2017-02-24 – 2017-03-09 (×14): 2 mg via ORAL
  Filled 2017-02-24 (×15): qty 1

## 2017-02-24 MED ORDER — VILAZODONE HCL 40 MG PO TABS
40.0000 mg | ORAL_TABLET | Freq: Every day | ORAL | Status: DC
  Administered 2017-02-25 – 2017-03-16 (×18): 40 mg via ORAL
  Filled 2017-02-24 (×20): qty 1

## 2017-02-24 MED ORDER — SODIUM CHLORIDE 0.9 % IV SOLN
Freq: Once | INTRAVENOUS | Status: AC
  Administered 2017-02-24: 09:00:00 via INTRAVENOUS

## 2017-02-24 MED ORDER — CHLORHEXIDINE GLUCONATE 0.12 % MT SOLN
15.0000 mL | Freq: Two times a day (BID) | OROMUCOSAL | Status: DC
  Administered 2017-02-24 – 2017-03-16 (×32): 15 mL via OROMUCOSAL
  Filled 2017-02-24 (×36): qty 15

## 2017-02-24 MED ORDER — ROCURONIUM BROMIDE 100 MG/10ML IV SOLN
INTRAVENOUS | Status: DC | PRN
  Administered 2017-02-24: 20 mg via INTRAVENOUS

## 2017-02-24 MED ORDER — METHYLPHENIDATE HCL 5 MG PO TABS: 20.0000 mg | ORAL_TABLET | Freq: Three times a day (TID) | ORAL | Status: DC | PRN

## 2017-02-24 MED ORDER — ORAL CARE MOUTH RINSE
15.0000 mL | Freq: Two times a day (BID) | OROMUCOSAL | Status: DC
  Administered 2017-02-25 – 2017-03-15 (×21): 15 mL via OROMUCOSAL

## 2017-02-24 MED ORDER — METHYLPHENIDATE HCL 5 MG PO TABS
40.0000 mg | ORAL_TABLET | Freq: Every day | ORAL | Status: DC
  Administered 2017-02-25 – 2017-03-02 (×5): 40 mg via ORAL
  Filled 2017-02-24 (×5): qty 8

## 2017-02-24 MED ORDER — FENTANYL CITRATE (PF) 100 MCG/2ML IJ SOLN
INTRAMUSCULAR | Status: DC | PRN
  Administered 2017-02-24: 50 ug via INTRAVENOUS
  Administered 2017-02-24 (×2): 100 ug via INTRAVENOUS

## 2017-02-24 MED ORDER — LIDOCAINE 2% (20 MG/ML) 5 ML SYRINGE
INTRAMUSCULAR | Status: AC
  Filled 2017-02-24: qty 5

## 2017-02-24 MED ORDER — PHENYLEPHRINE HCL 10 MG/ML IJ SOLN
INTRAMUSCULAR | Status: DC | PRN
  Administered 2017-02-24: 80 ug via INTRAVENOUS
  Administered 2017-02-24: 40 ug via INTRAVENOUS

## 2017-02-24 MED ORDER — SUGAMMADEX SODIUM 200 MG/2ML IV SOLN
INTRAVENOUS | Status: AC
  Filled 2017-02-24: qty 2

## 2017-02-24 MED ORDER — VANCOMYCIN HCL 1000 MG IV SOLR
INTRAVENOUS | Status: AC
  Filled 2017-02-24: qty 1000

## 2017-02-24 MED ORDER — SUCCINYLCHOLINE CHLORIDE 20 MG/ML IJ SOLN
INTRAMUSCULAR | Status: DC | PRN
Start: 2017-02-24 — End: 2017-02-24
  Administered 2017-02-24: 100 mg via INTRAVENOUS

## 2017-02-24 MED ORDER — VANCOMYCIN HCL 500 MG IV SOLR
INTRAVENOUS | Status: AC
  Filled 2017-02-24: qty 500

## 2017-02-24 MED ORDER — CLORAZEPATE DIPOTASSIUM 7.5 MG PO TABS
7.5000 mg | ORAL_TABLET | Freq: Three times a day (TID) | ORAL | Status: DC | PRN
  Administered 2017-02-25 – 2017-03-03 (×11): 7.5 mg via ORAL
  Filled 2017-02-24 (×11): qty 2

## 2017-02-24 MED ORDER — PHENYLEPHRINE HCL 10 MG/ML IJ SOLN
INTRAMUSCULAR | Status: DC | PRN
  Administered 2017-02-24: 40 ug/min via INTRAVENOUS

## 2017-02-24 MED ORDER — METHYLPHENIDATE HCL 5 MG PO TABS
20.0000 mg | ORAL_TABLET | ORAL | Status: DC
Start: 2017-02-24 — End: 2017-02-24

## 2017-02-24 SURGICAL SUPPLY — 66 items
BANDAGE ACE 4X5 VEL STRL LF (GAUZE/BANDAGES/DRESSINGS) ×4 IMPLANT
BANDAGE ACE 6X5 VEL STRL LF (GAUZE/BANDAGES/DRESSINGS) ×4 IMPLANT
BIT DRILL 2.5X2.75 QC CALB (BIT) ×2 IMPLANT
BIT DRILL 3.5X5.5 QC CALB (BIT) ×2 IMPLANT
BLADE SURG 10 STRL SS (BLADE) ×8 IMPLANT
BNDG COHESIVE 4X5 TAN STRL (GAUZE/BANDAGES/DRESSINGS) ×4 IMPLANT
BNDG COHESIVE 6X5 TAN STRL LF (GAUZE/BANDAGES/DRESSINGS) ×2 IMPLANT
BNDG GAUZE ELAST 4 BULKY (GAUZE/BANDAGES/DRESSINGS) ×6 IMPLANT
BNDG GAUZE STRTCH 6 (GAUZE/BANDAGES/DRESSINGS) ×12 IMPLANT
BONE CEMENT PALACOSE (Cement) ×4 IMPLANT
BRUSH SCRUB SURG 4.25 DISP (MISCELLANEOUS) ×8 IMPLANT
CEMENT BONE PALACOSE (Cement) ×1 IMPLANT
COVER SURGICAL LIGHT HANDLE (MISCELLANEOUS) ×8 IMPLANT
DRAPE C-ARM 42X72 X-RAY (DRAPES) ×4 IMPLANT
DRAPE C-ARMOR (DRAPES) ×4 IMPLANT
DRAPE EXTREMITY BILATERAL (DRAPES) ×2 IMPLANT
DRAPE HALF SHEET 40X57 (DRAPES) ×10 IMPLANT
DRAPE INCISE IOBAN 66X45 STRL (DRAPES) ×2 IMPLANT
DRAPE U-SHAPE 47X51 STRL (DRAPES) ×4 IMPLANT
DRSG ADAPTIC 3X8 NADH LF (GAUZE/BANDAGES/DRESSINGS) ×4 IMPLANT
DRSG VAC ATS MED SENSATRAC (GAUZE/BANDAGES/DRESSINGS) ×2 IMPLANT
ELECT CAUTERY BLADE 6.4 (BLADE) ×2 IMPLANT
ELECT REM PT RETURN 9FT ADLT (ELECTROSURGICAL) ×4
ELECTRODE REM PT RTRN 9FT ADLT (ELECTROSURGICAL) ×3 IMPLANT
GAUZE SPONGE 4X4 12PLY STRL (GAUZE/BANDAGES/DRESSINGS) ×4 IMPLANT
GLOVE BIO SURGEON STRL SZ7.5 (GLOVE) ×4 IMPLANT
GLOVE BIO SURGEON STRL SZ8 (GLOVE) ×4 IMPLANT
GLOVE BIO SURGEON STRL SZ8.5 (GLOVE) ×4 IMPLANT
GLOVE BIOGEL PI IND STRL 7.5 (GLOVE) ×3 IMPLANT
GLOVE BIOGEL PI IND STRL 8 (GLOVE) ×3 IMPLANT
GLOVE BIOGEL PI INDICATOR 7.5 (GLOVE) ×1
GLOVE BIOGEL PI INDICATOR 8 (GLOVE) ×1
GLOVE SURG SS PI 7.0 STRL IVOR (GLOVE) ×2 IMPLANT
GOWN STRL REUS W/ TWL LRG LVL3 (GOWN DISPOSABLE) ×6 IMPLANT
GOWN STRL REUS W/ TWL XL LVL3 (GOWN DISPOSABLE) ×3 IMPLANT
GOWN STRL REUS W/TWL LRG LVL3 (GOWN DISPOSABLE) ×8
GOWN STRL REUS W/TWL XL LVL3 (GOWN DISPOSABLE) ×4
HANDPIECE INTERPULSE COAX TIP (DISPOSABLE)
KIT BASIN OR (CUSTOM PROCEDURE TRAY) ×4 IMPLANT
KIT ROOM TURNOVER OR (KITS) ×4 IMPLANT
MANIFOLD NEPTUNE II (INSTRUMENTS) ×4 IMPLANT
NS IRRIG 1000ML POUR BTL (IV SOLUTION) ×4 IMPLANT
PACK ORTHO EXTREMITY (CUSTOM PROCEDURE TRAY) ×4 IMPLANT
PAD ARMBOARD 7.5X6 YLW CONV (MISCELLANEOUS) ×8 IMPLANT
PADDING CAST COTTON 6X4 STRL (CAST SUPPLIES) ×4 IMPLANT
SCREW CORTICAL 3.5MM 65MM (Screw) ×4 IMPLANT
SET HNDPC FAN SPRY TIP SCT (DISPOSABLE) IMPLANT
SPONGE LAP 18X18 X RAY DECT (DISPOSABLE) ×6 IMPLANT
STAPLER VISISTAT 35W (STAPLE) ×4 IMPLANT
STOCKINETTE IMPERVIOUS 9X36 MD (GAUZE/BANDAGES/DRESSINGS) ×4 IMPLANT
STOCKINETTE IMPERVIOUS LG (DRAPES) ×2 IMPLANT
STRIP CLOSURE SKIN 1/2X4 (GAUZE/BANDAGES/DRESSINGS) ×4 IMPLANT
SUT PDS AB 2-0 CT1 27 (SUTURE) IMPLANT
SUT PROLENE 0 CT 1 CR/8 (SUTURE) ×2 IMPLANT
SUT PROLENE 2 0 CT 1 (SUTURE) ×2 IMPLANT
SUT VIC AB 0 CT1 27 (SUTURE)
SUT VIC AB 0 CT1 27XBRD ANBCTR (SUTURE) IMPLANT
SUT VIC AB 2-0 CT1 27 (SUTURE)
SUT VIC AB 2-0 CT1 TAPERPNT 27 (SUTURE) IMPLANT
SWAB CULTURE ESWAB REG 1ML (MISCELLANEOUS) IMPLANT
TOWEL OR 17X24 6PK STRL BLUE (TOWEL DISPOSABLE) ×4 IMPLANT
TOWEL OR 17X26 10 PK STRL BLUE (TOWEL DISPOSABLE) ×8 IMPLANT
TUBE CONNECTING 12X1/4 (SUCTIONS) ×4 IMPLANT
UNDERPAD 30X30 (UNDERPADS AND DIAPERS) ×4 IMPLANT
WATER STERILE IRR 1000ML POUR (IV SOLUTION) ×4 IMPLANT
YANKAUER SUCT BULB TIP NO VENT (SUCTIONS) ×4 IMPLANT

## 2017-02-24 NOTE — Progress Notes (Signed)
Deborah MunroeUna Mccormac (sister) [phone] requested to be contacted by Dr. Carola FrostHandy after surgery.  Neomia DearUna stated that she would check in at the volunteer desk and will have the pager.

## 2017-02-24 NOTE — Transfer of Care (Signed)
Immediate Anesthesia Transfer of Care Note  Patient: Deborah [name]  Procedure(s) Performed: Procedure(s): DEBRIDEMENT AND POSSIBLE REPAIR BILATERAL TIBIAS (Bilateral) POSSIBLE INTRAMEDULLARY (IM) NAIL TIBIAL (Bilateral)  Patient Location: PACU  Anesthesia Type:General  Level of Consciousness: awake, sedated and drowsy  Airway & Oxygen Therapy: Patient Spontanous Breathing and Patient connected to nasal cannula oxygen  Post-op Assessment: Report given to RN, Post -op Vital signs reviewed and stable, Patient moving all extremities, Patient moving all extremities X 4 and Patient able to stick tongue midline  Post vital signs: Reviewed and stable  Last Vitals:  Vitals:   02/24/17 1106 02/24/17 1216  BP: 125/67 (!) 143/75  Pulse: 92 99  Resp: 16 16  Temp: 36.9 C 37.1 C    Last Pain:  Vitals:   02/24/17 1216  TempSrc: Oral  PainSc:       Patients Stated Pain Goal: 4 (02/24/17 0820)  Complications: No apparent anesthesia complications

## 2017-02-24 NOTE — Anesthesia Procedure Notes (Signed)
Procedure Name: Intubation Date/Time: 02/24/2017 1:44 PM Performed by: Coralee RudFLORES, Irvin Bastin Pre-anesthesia Checklist: Patient identified, Emergency Drugs available, Suction available and Patient being monitored Patient Re-evaluated:Patient Re-evaluated prior to induction Oxygen Delivery Method: Circle system utilized Preoxygenation: Pre-oxygenation with 100% oxygen Induction Type: IV induction Ventilation: Mask ventilation without difficulty Laryngoscope Size: Miller and 3 Grade View: Grade I Tube type: Oral Tube size: 7.5 mm Number of attempts: 1 Airway Equipment and Method: Stylet Placement Confirmation: ETT inserted through vocal cords under direct vision,  positive ETCO2 and breath sounds checked- equal and bilateral Secured at: 22 cm Tube secured with: Tape Dental Injury: Teeth and Oropharynx as per pre-operative assessment

## 2017-02-24 NOTE — Progress Notes (Signed)
Patient ID: Deborah [name], female   DOB: 07-Mar-1956, 61 y.o.   MRN: [id number]  BP (!) 134/92   Pulse (!) 101   Temp (!) 97.4 F (36.3 C)   Resp 19   Ht 5' (1.524 m)   Wt 47.6 kg (105 lb)   SpO2 96%   BMI 20.51 kg/m   Saw patient in holding room before she went to surgery. Alert followed commands. No change. Spine is cleared.
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
## 2017-02-24 NOTE — Progress Notes (Signed)
Doing well with lip and nose. The lip is healing well without evidence of infection. The nose looks straight and no obvious issues  She will need sutures out in 6 days. The nose is fractured but not sure if anything will need to be done as the nose looks straight. Will let the rest of the swelling go down and let the patient reassess the look.

## 2017-02-24 NOTE — Anesthesia Postprocedure Evaluation (Signed)
Anesthesia Post Note  Patient: Deborah [name]  Procedure(s) Performed: Procedure(s) (LRB): DEBRIDEMENT AND POSSIBLE REPAIR BILATERAL TIBIAS (Bilateral) POSSIBLE INTRAMEDULLARY (IM) NAIL TIBIAL (Bilateral)     Patient location during evaluation: PACU Anesthesia Type: General Level of consciousness: awake and patient cooperative Pain management: pain level controlled Vital Signs Assessment: post-procedure vital signs reviewed and stable Respiratory status: spontaneous breathing, nonlabored ventilation, respiratory function stable and patient connected to nasal cannula oxygen Cardiovascular status: blood pressure returned to baseline and stable Postop Assessment: no signs of nausea or vomiting Anesthetic complications: no    Last Vitals:  Vitals:   02/24/17 1654 02/24/17 1715  BP: (!) 143/85 (!) 142/82  Pulse: 99 92  Resp: 13 18  Temp: (!) 36 C     Last Pain:  Vitals:   02/24/17 1216  TempSrc: Oral  PainSc:                  [name],[name]

## 2017-02-24 NOTE — Anesthesia Postprocedure Evaluation (Addendum)
Anesthesia Post Note  Patient: Darrick GrinderBernadette M Farinas  Procedure(s) Performed: Procedure(s) (LRB): IRRIGATION AND DEBRIDEMENT BILATERL LOWER EXTREMITIES (Bilateral) EXTERNAL FIXATION LEFT LOWER LEG (Bilateral) FACIAL LACERATION REPAIR (N/A)     Patient location during evaluation: PACU Anesthesia Type: General Level of consciousness: sedated and patient cooperative Pain management: pain level controlled Vital Signs Assessment: post-procedure vital signs reviewed and stable Respiratory status: spontaneous breathing Cardiovascular status: stable Anesthetic complications: no    Last Vitals:  Vitals:   02/24/17 0700 02/24/17 0726  BP: 114/72   Pulse: 96   Resp:  16  Temp:  36.9 C    Last Pain:  Vitals:   02/24/17 0726  TempSrc: Oral  PainSc: (P) Asleep                 Lewie LoronJohn Heiley Shaikh

## 2017-02-25 LAB — BASIC METABOLIC PANEL
ANION GAP: 3 — AB (ref 5–15)
BUN: 8 mg/dL (ref 6–20)
CALCIUM: 7.8 mg/dL — AB (ref 8.9–10.3)
CO2: 28 mmol/L (ref 22–32)
CREATININE: 0.47 mg/dL (ref 0.44–1.00)
Chloride: 106 mmol/L (ref 101–111)
GLUCOSE: 155 mg/dL — AB (ref 65–99)
Potassium: 4.2 mmol/L (ref 3.5–5.1)
Sodium: 137 mmol/L (ref 135–145)

## 2017-02-25 LAB — TYPE AND SCREEN
ABO/RH(D): O POS
ANTIBODY SCREEN: NEGATIVE
UNIT DIVISION: 0
UNIT DIVISION: 0
Unit division: 0
Unit division: 0

## 2017-02-25 LAB — BPAM RBC
Blood Product Expiration Date: 201808242359
Blood Product Expiration Date: 201808242359
Blood Product Expiration Date: 201808262359
Blood Product Expiration Date: 201808272359
ISSUE DATE / TIME: 201807260306
ISSUE DATE / TIME: 201807260733
ISSUE DATE / TIME: 201807270832
ISSUE DATE / TIME: 201807271033
UNIT TYPE AND RH: 5100
Unit Type and Rh: 5100
Unit Type and Rh: 5100
Unit Type and Rh: 5100

## 2017-02-25 LAB — CBC WITH DIFFERENTIAL/PLATELET
BASOS ABS: 0 10*3/uL (ref 0.0–0.1)
BASOS PCT: 0 %
EOS PCT: 0 %
Eosinophils Absolute: 0 10*3/uL (ref 0.0–0.7)
HCT: 26.9 % — ABNORMAL LOW (ref 36.0–46.0)
Hemoglobin: 8.8 g/dL — ABNORMAL LOW (ref 12.0–15.0)
Lymphocytes Relative: 7 %
Lymphs Abs: 0.6 10*3/uL — ABNORMAL LOW (ref 0.7–4.0)
MCH: 28.5 pg (ref 26.0–34.0)
MCHC: 32.7 g/dL (ref 30.0–36.0)
MCV: 87.1 fL (ref 78.0–100.0)
MONO ABS: 0.5 10*3/uL (ref 0.1–1.0)
MONOS PCT: 6 %
Neutro Abs: 7.2 10*3/uL (ref 1.7–7.7)
Neutrophils Relative %: 87 %
PLATELETS: 128 10*3/uL — AB (ref 150–400)
RBC: 3.09 MIL/uL — ABNORMAL LOW (ref 3.87–5.11)
RDW: 15.8 % — AB (ref 11.5–15.5)
WBC: 8.2 10*3/uL (ref 4.0–10.5)

## 2017-02-25 MED ORDER — BACITRACIN ZINC 500 UNIT/GM EX OINT
TOPICAL_OINTMENT | Freq: Two times a day (BID) | CUTANEOUS | Status: DC
  Administered 2017-02-25: 1 via TOPICAL
  Administered 2017-02-25 – 2017-02-27 (×4): via TOPICAL
  Administered 2017-02-27 – 2017-03-01 (×3): 1 via TOPICAL
  Administered 2017-03-01 – 2017-03-02 (×2): via TOPICAL
  Administered 2017-03-02: 1 via TOPICAL
  Administered 2017-03-03 – 2017-03-05 (×4): via TOPICAL
  Administered 2017-03-06: 1 via TOPICAL
  Administered 2017-03-06 – 2017-03-07 (×2): via TOPICAL
  Administered 2017-03-07: 1 via TOPICAL
  Administered 2017-03-08 – 2017-03-11 (×7): via TOPICAL
  Administered 2017-03-12: 1 via TOPICAL
  Administered 2017-03-12 – 2017-03-13 (×3): via TOPICAL
  Administered 2017-03-14: 1 via TOPICAL
  Administered 2017-03-15 – 2017-03-16 (×2): via TOPICAL
  Filled 2017-02-25 (×2): qty 28.35

## 2017-02-25 MED ORDER — WHITE PETROLATUM GEL
Status: AC
  Administered 2017-02-25: 1
  Filled 2017-02-25: qty 1

## 2017-02-25 NOTE — Progress Notes (Signed)
Patient ID: Deborah GrinderBernadette M Colantuono, female   DOB: Aug 17, 1955, 61 y.o.   MRN: [id number] Pt remains sleepy but arousable, answers simple questions, FC all 4, following
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
## 2017-02-26 MED ORDER — ACETAMINOPHEN 325 MG PO TABS
650.0000 mg | ORAL_TABLET | Freq: Four times a day (QID) | ORAL | Status: DC
  Administered 2017-02-26 – 2017-03-16 (×60): 650 mg via ORAL
  Filled 2017-02-26 (×61): qty 2

## 2017-02-26 MED ORDER — OXYCODONE HCL 5 MG PO TABS
5.0000 mg | ORAL_TABLET | ORAL | Status: DC | PRN
  Administered 2017-02-26 – 2017-03-03 (×6): 5 mg via ORAL
  Filled 2017-02-26 (×6): qty 1

## 2017-02-26 MED ORDER — SODIUM CHLORIDE 0.9 % IV SOLN
0.4000 ug/kg/h | INTRAVENOUS | Status: DC
  Administered 2017-02-26: 0.4 ug/kg/h via INTRAVENOUS
  Filled 2017-02-26 (×2): qty 2

## 2017-02-26 NOTE — Progress Notes (Signed)
Patient ID: Deborah [name], female   DOB: 1955/08/08, 61 y.o.   MRN: [id number]     Subjective:  Patient reports pain as mild to moderate.  Patient in bed and in no acute distress.  Patient wants the mittens off  Objective:   VITALS:   Vitals:   02/26/17 0600 02/26/17 0700 02/26/17 0742 02/26/17 0800  BP: 101/66 109/68  108/63  Pulse: 72 68  67  Resp: 10 11  11   Temp:   (!) 97.4 F (36.3 C)   TempSrc:   Axillary   SpO2: 96% 97%  95%  Weight:      Height:        ABD soft Sensation intact distally Dorsiflexion/Plantar flexion intact Incision: dressing C/D/I and scant drainage bilateral ex fix in place Wound vac in place  Lab Results  Component Value Date   WBC 8.2 02/25/2017   HGB 8.8 (L) 02/25/2017   HCT 26.9 (L) 02/25/2017   MCV 87.1 02/25/2017   PLT 128 (L) 02/25/2017   BMET    Component Value Date/Time   NA 137 02/25/2017 0305   K 4.2 02/25/2017 0305   CL 106 02/25/2017 0305   CO2 28 02/25/2017 0305   GLUCOSE 155 (H) 02/25/2017 0305   BUN 8 02/25/2017 0305   CREATININE 0.47 02/25/2017 0305   CALCIUM 7.8 (L) 02/25/2017 0305   GFRNONAA >60 02/25/2017 0305   GFRAA >60 02/25/2017 0305     Assessment/Plan: 2 Days Post-Op   Active Problems:   Bilateral tibial fractures   Advance diet Continue plan per trauma Continue pan per Dr Carola FrostHandy Plan for OR on Tues.   Haskel KhanDOUGLAS PARRY, BRANDON 02/26/2017, 10:36 AM  Discussed and agree with above.   Teryl LucyJoshua Kyree Adriano, MD Cell [phone]
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
## 2017-02-26 NOTE — Progress Notes (Signed)
Patient ID: Deborah [name], female   DOB: April 12, 1956, 61 y.o.   MRN: [id number] Easily arousable, FC, MAE, reports no headache, following

## 2017-02-27 ENCOUNTER — Encounter (HOSPITAL_COMMUNITY): Payer: Self-pay | Admitting: Orthopedic Surgery

## 2017-02-27 ENCOUNTER — Inpatient Hospital Stay (HOSPITAL_COMMUNITY): Payer: No Typology Code available for payment source

## 2017-02-27 LAB — CBC
HCT: 27.3 % — ABNORMAL LOW (ref 36.0–46.0)
Hemoglobin: 8.9 g/dL — ABNORMAL LOW (ref 12.0–15.0)
MCH: 28.8 pg (ref 26.0–34.0)
MCHC: 32.6 g/dL (ref 30.0–36.0)
MCV: 88.3 fL (ref 78.0–100.0)
Platelets: 189 10*3/uL (ref 150–400)
RBC: 3.09 MIL/uL — ABNORMAL LOW (ref 3.87–5.11)
RDW: 14.9 % (ref 11.5–15.5)
WBC: 5.9 10*3/uL (ref 4.0–10.5)

## 2017-02-27 LAB — GLUCOSE, CAPILLARY: Glucose-Capillary: 135 mg/dL — ABNORMAL HIGH (ref 65–99)

## 2017-02-27 NOTE — Progress Notes (Signed)
Patient ID: [name], female   DOB: 03-29-1956, 61 y.o.   MRN: [id number] BP 108/71   Pulse 67   Temp 98.2 F (36.8 C) (Axillary)   Resp (!) 9   Ht 5' (1.524 m)   Wt 47.6 kg (105 lb)   SpO2 99%   BMI 20.51 kg/m  Alert and oriented x 4, speech is clear and fluent Stable neurologically Would expect continued improvement

## 2017-02-27 NOTE — Progress Notes (Signed)
Trauma Service Note  Subjective: Patient doing well. Still confused.  Complaining bitterly of pain in her elbows, but the only one that was tender was the left elbow.  Objective: Vital signs in last 24 hours: Temp:  [97.6 F (36.4 C)-98.1 F (36.7 C)] 97.7 F (36.5 C) (07/30 0800) Pulse Rate:  [58-125] 70 (07/30 1000) Resp:  [6-26] 21 (07/30 1000) BP: (79-205)/(55-191) 125/70 (07/30 1000) SpO2:  [95 %-100 %] 99 % (07/30 1000) Last BM Date: 02/22/17  Intake/Output from previous day: 07/29 0701 - 07/30 0700 In: 1963.5 [I.V.:1813.5; IV Piggyback:150] Out: 815 [Urine:690; Drains:125] Intake/Output this shift: Total I/O In: 150 [I.V.:150] Out: -   General: No acute distress  Lungs: Clear  Abd: Benign, excellent bowel sounds  Extremities: Tender left elbow with questionable deformity  Neuro: Answering questions appropriately.  Lab Results: CBC   Recent Labs  02/25/17 0305  WBC 8.2  HGB 8.8*  HCT 26.9*  PLT 128*   BMET  Recent Labs  02/25/17 0305  NA 137  K 4.2  CL 106  CO2 28  GLUCOSE 155*  BUN 8  CREATININE 0.47  CALCIUM 7.8*   PT/INR No results for input(s): LABPROT, INR in the last 72 hours. ABG No results for input(s): PHART, HCO3 in the last 72 hours.  Invalid input(s): PCO2, PO2  Studies/Results: No results found.  Anti-infectives: Anti-infectives    Start     Dose/Rate Route Frequency Ordered Stop   02/24/17 1500  vancomycin (VANCOCIN) powder  Status:  Discontinued       As needed 02/24/17 1500 02/24/17 1645   02/24/17 1500  gentamicin (GARAMYCIN) injection  Status:  Discontinued       As needed 02/24/17 1500 02/24/17 1645   02/23/17 0200  ceFAZolin (ANCEF) IVPB 1 g/50 mL premix     1 g 100 mL/hr over 30 Minutes Intravenous Every 8 hours 02/22/17 2342        Assessment/Plan: s/p Procedure(s): BILATERAL TIBIAS DEBRIDEMENT AND PLACEMENT OF ANTIBIOTIC BEADS LEFT TIBIAS Open Reduction Internal Fixation Tibial Plateau Advance  diet X-ryas of left elbow and left forearm.  Or tomorrow with ortho for lower extremities.   LOS: 5 days   Marta LamasJames O. Gae BonWyatt, III, MD, [name] [phone] Trauma Surgeon 02/27/2017
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
## 2017-02-28 ENCOUNTER — Inpatient Hospital Stay (HOSPITAL_COMMUNITY): Payer: No Typology Code available for payment source

## 2017-02-28 ENCOUNTER — Inpatient Hospital Stay (HOSPITAL_COMMUNITY): Payer: No Typology Code available for payment source | Admitting: Anesthesiology

## 2017-02-28 ENCOUNTER — Encounter (HOSPITAL_COMMUNITY): Payer: Self-pay | Admitting: Certified Registered Nurse Anesthetist

## 2017-02-28 ENCOUNTER — Encounter (HOSPITAL_COMMUNITY): Admission: EM | Disposition: A | Discharge status: Skilled Nursing Facility | Payer: Self-pay | Source: Home / Self Care

## 2017-02-28 HISTORY — PX: EXTERNAL FIXATION REMOVAL: SHX5040

## 2017-02-28 HISTORY — PX: ORIF TIBIA FRACTURE: SHX5416

## 2017-02-28 LAB — COMPREHENSIVE METABOLIC PANEL
ALBUMIN: 2.1 g/dL — AB (ref 3.5–5.0)
ALT: 17 U/L (ref 14–54)
AST: 25 U/L (ref 15–41)
Alkaline Phosphatase: 36 U/L — ABNORMAL LOW (ref 38–126)
Anion gap: 3 — ABNORMAL LOW (ref 5–15)
BUN: 9 mg/dL (ref 6–20)
CHLORIDE: 104 mmol/L (ref 101–111)
CO2: 27 mmol/L (ref 22–32)
Calcium: 7.7 mg/dL — ABNORMAL LOW (ref 8.9–10.3)
Creatinine, Ser: 0.47 mg/dL (ref 0.44–1.00)
GFR calc Af Amer: >60 mL/min (ref >60)
GFR calc non Af Amer: >60 mL/min (ref >60)
GLUCOSE: 102 mg/dL — AB (ref 65–99)
POTASSIUM: 3.9 mmol/L (ref 3.5–5.1)
SODIUM: 134 mmol/L — AB (ref 135–145)
Total Bilirubin: 0.5 mg/dL (ref 0.3–1.2)
Total Protein: 4.2 g/dL — ABNORMAL LOW (ref 6.5–8.1)

## 2017-02-28 LAB — CBC
HEMATOCRIT: 28.3 % — AB (ref 36.0–46.0)
Hemoglobin: 9.6 g/dL — ABNORMAL LOW (ref 12.0–15.0)
MCH: 29.7 pg (ref 26.0–34.0)
MCHC: 33.9 g/dL (ref 30.0–36.0)
MCV: 87.6 fL (ref 78.0–100.0)
PLATELETS: 211 10*3/uL (ref 150–400)
RBC: 3.23 MIL/uL — ABNORMAL LOW (ref 3.87–5.11)
RDW: 14.6 % (ref 11.5–15.5)
WBC: 7.4 10*3/uL (ref 4.0–10.5)

## 2017-02-28 LAB — SURGICAL PCR SCREEN
MRSA, PCR: NEGATIVE
STAPHYLOCOCCUS AUREUS: POSITIVE — AB

## 2017-02-28 LAB — PROTIME-INR
INR: 1.75
Prothrombin Time: 20.6 seconds — ABNORMAL HIGH (ref 11.4–15.2)

## 2017-02-28 SURGERY — OPEN REDUCTION INTERNAL FIXATION (ORIF) TIBIA FRACTURE
Anesthesia: General | Laterality: Bilateral

## 2017-02-28 MED ORDER — HYDROMORPHONE HCL 1 MG/ML IJ SOLN
INTRAMUSCULAR | Status: AC
  Filled 2017-02-28: qty 1

## 2017-02-28 MED ORDER — CEFAZOLIN SODIUM-DEXTROSE 1-4 GM/50ML-% IV SOLN
1.0000 g | Freq: Three times a day (TID) | INTRAVENOUS | Status: AC
  Administered 2017-02-28 – 2017-03-02 (×6): 1 g via INTRAVENOUS
  Filled 2017-02-28 (×7): qty 50

## 2017-02-28 MED ORDER — PROPOFOL 10 MG/ML IV BOLUS
INTRAVENOUS | Status: DC | PRN
  Administered 2017-02-28: 150 mg via INTRAVENOUS

## 2017-02-28 MED ORDER — MIDAZOLAM HCL 2 MG/2ML IJ SOLN
INTRAMUSCULAR | Status: AC
  Filled 2017-02-28: qty 2

## 2017-02-28 MED ORDER — MIDAZOLAM HCL 5 MG/5ML IJ SOLN
INTRAMUSCULAR | Status: DC | PRN
  Administered 2017-02-28 (×2): 1 mg via INTRAVENOUS

## 2017-02-28 MED ORDER — PROPOFOL 10 MG/ML IV BOLUS
INTRAVENOUS | Status: AC
  Filled 2017-02-28: qty 20

## 2017-02-28 MED ORDER — FENTANYL CITRATE (PF) 100 MCG/2ML IJ SOLN
INTRAMUSCULAR | Status: DC | PRN
  Administered 2017-02-28 (×2): 100 ug via INTRAVENOUS
  Administered 2017-02-28 (×4): 50 ug via INTRAVENOUS
  Administered 2017-02-28: 100 ug via INTRAVENOUS

## 2017-02-28 MED ORDER — FENTANYL CITRATE (PF) 250 MCG/5ML IJ SOLN
INTRAMUSCULAR | Status: AC
  Filled 2017-02-28: qty 5

## 2017-02-28 MED ORDER — HYDROMORPHONE HCL 1 MG/ML IJ SOLN
INTRAMUSCULAR | Status: DC | PRN
  Administered 2017-02-28 (×2): .5 mg via INTRAVENOUS

## 2017-02-28 MED ORDER — FENTANYL CITRATE (PF) 100 MCG/2ML IJ SOLN
25.0000 ug | INTRAMUSCULAR | Status: DC | PRN
Start: 2017-02-28 — End: 2017-02-28

## 2017-02-28 MED ORDER — DEXAMETHASONE SODIUM PHOSPHATE 10 MG/ML IJ SOLN
INTRAMUSCULAR | Status: DC | PRN
  Administered 2017-02-28: 5 mg via INTRAVENOUS

## 2017-02-28 MED ORDER — ONDANSETRON HCL 4 MG/2ML IJ SOLN: 4.0000 mg | Freq: Once | INTRAMUSCULAR | Status: DC | PRN

## 2017-02-28 MED ORDER — 0.9 % SODIUM CHLORIDE (POUR BTL) OPTIME
TOPICAL | Status: DC | PRN
  Administered 2017-02-28 (×2): 1000 mL

## 2017-02-28 MED ORDER — SUGAMMADEX SODIUM 200 MG/2ML IV SOLN
INTRAVENOUS | Status: DC | PRN
  Administered 2017-02-28: 100 mg via INTRAVENOUS

## 2017-02-28 MED ORDER — EPHEDRINE SULFATE 50 MG/ML IJ SOLN
INTRAMUSCULAR | Status: DC | PRN
  Administered 2017-02-28: 10 mg via INTRAVENOUS

## 2017-02-28 MED ORDER — ONDANSETRON HCL 4 MG/2ML IJ SOLN
INTRAMUSCULAR | Status: DC | PRN
  Administered 2017-02-28: 4 mg via INTRAVENOUS

## 2017-02-28 MED ORDER — LIDOCAINE HCL (CARDIAC) 20 MG/ML IV SOLN
INTRAVENOUS | Status: DC | PRN
  Administered 2017-02-28: 50 mg via INTRAVENOUS

## 2017-02-28 MED ORDER — ROCURONIUM BROMIDE 100 MG/10ML IV SOLN
INTRAVENOUS | Status: DC | PRN
  Administered 2017-02-28: 20 mg via INTRAVENOUS
  Administered 2017-02-28: 10 mg via INTRAVENOUS
  Administered 2017-02-28: 50 mg via INTRAVENOUS

## 2017-02-28 MED ORDER — LABETALOL HCL 5 MG/ML IV SOLN
INTRAVENOUS | Status: DC | PRN
  Administered 2017-02-28 (×4): 5 mg via INTRAVENOUS

## 2017-02-28 MED ORDER — LACTATED RINGERS IV SOLN
INTRAVENOUS | Status: DC | PRN
  Administered 2017-02-28 (×2): via INTRAVENOUS

## 2017-02-28 MED ORDER — PHENYLEPHRINE HCL 10 MG/ML IJ SOLN
INTRAMUSCULAR | Status: DC | PRN
  Administered 2017-02-28: 120 ug via INTRAVENOUS
  Administered 2017-02-28: 80 ug via INTRAVENOUS
  Administered 2017-02-28: 120 ug via INTRAVENOUS

## 2017-02-28 MED ORDER — MEPERIDINE HCL 25 MG/ML IJ SOLN: 6.2500 mg | INTRAMUSCULAR | Status: DC | PRN

## 2017-02-28 SURGICAL SUPPLY — 84 items
BANDAGE ACE 4X5 VEL STRL LF (GAUZE/BANDAGES/DRESSINGS) ×3 IMPLANT
BANDAGE ACE 6X5 VEL STRL LF (GAUZE/BANDAGES/DRESSINGS) ×3 IMPLANT
BIT DRILL 100X2.5XANTM LCK (BIT) IMPLANT
BIT DRILL CAL (BIT) IMPLANT
BIT DRL 100X2.5XANTM LCK (BIT) ×1
BLADE SURG 10 STRL SS (BLADE) ×4 IMPLANT
BNDG COHESIVE 4X5 TAN STRL (GAUZE/BANDAGES/DRESSINGS) ×3 IMPLANT
BNDG GAUZE ELAST 4 BULKY (GAUZE/BANDAGES/DRESSINGS) ×3 IMPLANT
BONE CEMENT PALACOS R-G (Cement) ×4 IMPLANT
BOWL SMART MIX CTS (DISPOSABLE) ×1 IMPLANT
BRUSH SCRUB SURG 4.25 DISP (MISCELLANEOUS) ×4 IMPLANT
CEMENT BONE PALACOS R-G (Cement) IMPLANT
COVER SURGICAL LIGHT HANDLE (MISCELLANEOUS) ×4 IMPLANT
DRAPE C-ARM 42X72 X-RAY (DRAPES) ×3 IMPLANT
DRAPE C-ARMOR (DRAPES) ×3 IMPLANT
DRAPE EXTREMITY BILATERAL (DRAPES) ×1 IMPLANT
DRAPE HALF SHEET 40X57 (DRAPES) ×4 IMPLANT
DRAPE IMP U-DRAPE 54X76 (DRAPES) ×2 IMPLANT
DRAPE INCISE IOBAN 66X45 STRL (DRAPES) ×2 IMPLANT
DRAPE ORTHO SPLIT 77X108 STRL (DRAPES) ×4
DRAPE SURG ORHT 6 SPLT 77X108 (DRAPES) IMPLANT
DRAPE U-SHAPE 47X51 STRL (DRAPES) ×3 IMPLANT
DRESSING PREVENA PLUS CUSTOM (GAUZE/BANDAGES/DRESSINGS) IMPLANT
DRILL BIT 2.5MM (BIT) ×2
DRILL BIT CAL (BIT) ×2
DRSG ADAPTIC 3X8 NADH LF (GAUZE/BANDAGES/DRESSINGS) ×1 IMPLANT
DRSG PAD ABDOMINAL 8X10 ST (GAUZE/BANDAGES/DRESSINGS) ×2 IMPLANT
DRSG PREVENA PLUS CUSTOM (GAUZE/BANDAGES/DRESSINGS) ×2
ELECT REM PT RETURN 9FT ADLT (ELECTROSURGICAL) ×2
ELECTRODE REM PT RTRN 9FT ADLT (ELECTROSURGICAL) ×1 IMPLANT
GAUZE SPONGE 4X4 12PLY STRL (GAUZE/BANDAGES/DRESSINGS) ×3 IMPLANT
GLOVE BIO SURGEON STRL SZ7.5 (GLOVE) ×2 IMPLANT
GLOVE BIO SURGEON STRL SZ8 (GLOVE) ×2 IMPLANT
GLOVE BIO SURGEON STRL SZ8.5 (GLOVE) ×2 IMPLANT
GLOVE BIOGEL PI IND STRL 7.5 (GLOVE) ×1 IMPLANT
GLOVE BIOGEL PI IND STRL 8 (GLOVE) ×1 IMPLANT
GLOVE BIOGEL PI INDICATOR 7.5 (GLOVE) ×1
GLOVE BIOGEL PI INDICATOR 8 (GLOVE) ×1
GOWN STRL REUS W/ TWL LRG LVL3 (GOWN DISPOSABLE) ×2 IMPLANT
GOWN STRL REUS W/ TWL XL LVL3 (GOWN DISPOSABLE) ×1 IMPLANT
GOWN STRL REUS W/TWL LRG LVL3 (GOWN DISPOSABLE) ×4
GOWN STRL REUS W/TWL XL LVL3 (GOWN DISPOSABLE) ×2
K-WIRE ACE 1.6X6 (WIRE) ×6
KIT BASIN OR (CUSTOM PROCEDURE TRAY) ×2 IMPLANT
KIT ROOM TURNOVER OR (KITS) ×2 IMPLANT
KWIRE ACE 1.6X6 (WIRE) IMPLANT
PACK ORTHO EXTREMITY (CUSTOM PROCEDURE TRAY) ×2 IMPLANT
PAD ARMBOARD 7.5X6 YLW CONV (MISCELLANEOUS) ×4 IMPLANT
PLATE LOCK 11H STD RT PROX TIB (Plate) ×1 IMPLANT
PLATE LOCK 15H STD LT PROX TIB (Plate) ×1 IMPLANT
SCREW CORT FT 32X3.5XNONLOCK (Screw) IMPLANT
SCREW CORTICAL 3.5MM  28MM (Screw) ×1 IMPLANT
SCREW CORTICAL 3.5MM  32MM (Screw) ×1 IMPLANT
SCREW CORTICAL 3.5MM 24MM (Screw) ×1 IMPLANT
SCREW CORTICAL 3.5MM 26MM (Screw) ×7 IMPLANT
SCREW CORTICAL 3.5MM 28MM (Screw) IMPLANT
SCREW CORTICAL 3.5MM 32MM (Screw) ×1 IMPLANT
SCREW CORTICAL 3.5MM 65MM (Screw) ×2 IMPLANT
SCREW LOCK 3.5X50 DIST TIB (Screw) ×1 IMPLANT
SCREW LOCK 3.5X65 DIST TIB (Screw) ×1 IMPLANT
SCREW LOCK CORT STAR 3.5X32 (Screw) ×1 IMPLANT
SCREW LOCK CORT STAR 3.5X60 (Screw) ×3 IMPLANT
SCREW LOCK CORT STAR 3.5X65 (Screw) ×9 IMPLANT
SCREW LOCK CORT STAR 3.5X70 (Screw) ×1 IMPLANT
SCREW LP 3.5X65MM (Screw) ×1 IMPLANT
SCREW LP 3.5X70MM (Screw) ×4 IMPLANT
SCREW LP 3.5X75MM (Screw) ×1 IMPLANT
STAPLER VISISTAT 35W (STAPLE) ×2 IMPLANT
STOCKINETTE IMPERVIOUS LG (DRAPES) ×2 IMPLANT
STRIP CLOSURE SKIN 1/2X4 (GAUZE/BANDAGES/DRESSINGS) ×2 IMPLANT
SUT ETHILON 2 0 FS 18 (SUTURE) ×2 IMPLANT
SUT ETHILON 2 0 PSLX (SUTURE) ×2 IMPLANT
SUT ETHILON 3 0 PS 1 (SUTURE) ×2 IMPLANT
SUT PDS AB 2-0 CT1 27 (SUTURE) ×2 IMPLANT
SUT VIC AB 0 CT1 27 (SUTURE)
SUT VIC AB 0 CT1 27XBRD ANBCTR (SUTURE) IMPLANT
SUT VIC AB 2-0 CT1 27 (SUTURE)
SUT VIC AB 2-0 CT1 TAPERPNT 27 (SUTURE) IMPLANT
SYR BULB IRRIGATION 50ML (SYRINGE) ×1 IMPLANT
TOWEL OR 17X24 6PK STRL BLUE (TOWEL DISPOSABLE) ×2 IMPLANT
TOWEL OR 17X26 10 PK STRL BLUE (TOWEL DISPOSABLE) ×4 IMPLANT
TUBE CONNECTING 20X1/4 (TUBING) ×1 IMPLANT
WND VAC CANISTER 500ML (MISCELLANEOUS) ×1 IMPLANT
YANKAUER SUCT BULB TIP NO VENT (SUCTIONS) IMPLANT
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
## 2017-02-28 NOTE — Progress Notes (Signed)
OT Cancellation Note    02/28/17 0700  OT Visit Information  Last OT Received On 02/28/17  Reason Eval/Treat Not Completed Patient at procedure or test/ unavailable  [facility] Marya Lowden, OT/L  [phone] 02/28/2017

## 2017-02-28 NOTE — Progress Notes (Signed)
Patient ID: [name], female   DOB: [birth year], 61 y.o.   MRN: [id number] BP (!) 167/87   Pulse 78   Temp 98.1 F (36.7 C) (Axillary)   Resp 10   Ht 5' (1.524 m)   Wt 47.6 kg (105 lb)   SpO2 95%   BMI 20.51 kg/m  Alert, following commands Stable exam Moving extremities No new recommendations

## 2017-02-28 NOTE — Progress Notes (Signed)
I discussed with the patient the risks and benefits of surgery, including the possibility of infection, nerve injury, vessel injury, wound breakdown, arthritis, symptomatic hardware, DVT/ PE, loss of motion, failure to prevent infection, and need for further surgery among others.  We also specifically discussed the possible need to further stage surgery because of the elevated risk of soft tissue breakdown that could lead to amputation.  She acknowledged these risks and wished to proceed.  Myrene GalasMichael Cloyde Oregel, MD [facility] [phone] (p)

## 2017-02-28 NOTE — Anesthesia Preprocedure Evaluation (Addendum)
Anesthesia Evaluation  Patient identified by MRN, date of birth, ID band Patient awake    Reviewed: Allergy & Precautions, NPO status , Patient's Chart, lab work & pertinent test results  History of Anesthesia Complications (+) PSEUDOCHOLINESTERASE DEFICIENCYNegative for: history of anesthetic complications  Airway Mallampati: II       Dental no notable dental hx.    Pulmonary Current Smoker,    breath sounds clear to auscultation       Cardiovascular negative cardio ROS   Rhythm:Regular Rate:Tachycardia     Neuro/Psych    GI/Hepatic negative GI ROS, Neg liver ROS,   Endo/Other  negative endocrine ROS  Renal/GU negative Renal ROS     Musculoskeletal Multi trauma   Abdominal   Peds  Hematology  (+) anemia ,   Anesthesia Other Findings   Reproductive/Obstetrics                            Anesthesia Physical  Anesthesia Plan  ASA: III  Anesthesia Plan: General   Post-op Pain Management:    Induction: Intravenous  PONV Risk Score and Plan: 3 and Ondansetron, Dexamethasone, Propofol, Midazolam and Treatment may vary due to age or medical condition  Airway Management Planned: Oral ETT  Additional Equipment:   Intra-op Plan:   Post-operative Plan: Extubation in OR  Informed Consent: I have reviewed the patients History and Physical, chart, labs and discussed the procedure including the risks, benefits and alternatives for the proposed anesthesia with the patient or authorized representative who has indicated his/her understanding and acceptance.   Dental advisory given  Plan Discussed with: CRNA  Anesthesia Plan Comments:         Anesthesia Quick Evaluation

## 2017-02-28 NOTE — Anesthesia Postprocedure Evaluation (Signed)
Anesthesia Post Note  Patient: Deborah [name]  Procedure(s) Performed: Procedure(s) (LRB): REMOVAL EXTERNAL FIXATION LEG (Bilateral) OPEN REDUCTION INTERNAL FIXATION (ORIF) TIBIA FRACTURE (Bilateral)     Patient location during evaluation: PACU Anesthesia Type: General Level of consciousness: awake and alert Pain management: pain level controlled Vital Signs Assessment: post-procedure vital signs reviewed and stable Respiratory status: spontaneous breathing, nonlabored ventilation, respiratory function stable and patient connected to nasal cannula oxygen Cardiovascular status: blood pressure returned to baseline and stable Postop Assessment: no signs of nausea or vomiting Anesthetic complications: no    Last Vitals:  Vitals:   02/28/17 1500 02/28/17 1600  BP: (!) 158/95   Pulse: 71   Resp: 13   Temp:  36.7 C    Last Pain:  Vitals:   02/28/17 1600  TempSrc: Axillary  PainSc:                  Shelton SilvasKevin D Tarrence Enck
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
## 2017-02-28 NOTE — Transfer of Care (Signed)
Immediate Anesthesia Transfer of Care Note  Patient: [name]  Procedure(s) Performed: Procedure(s): REMOVAL EXTERNAL FIXATION LEG (Bilateral) OPEN REDUCTION INTERNAL FIXATION (ORIF) TIBIA FRACTURE (Bilateral)  Patient Location: PACU  Anesthesia Type:General  Level of Consciousness: awake, lethargic and responds to stimulation  Airway & Oxygen Therapy: Patient Spontanous Breathing  Post-op Assessment: Report given to RN and Post -op Vital signs reviewed and stable  Post vital signs: Reviewed and stable  Last Vitals:  Vitals:   02/28/17 0500 02/28/17 0600  BP: 137/78 (!) 142/78  Pulse: 66 70  Resp: 15 (!) 8  Temp:      Last Pain:  Vitals:   02/28/17 0400  TempSrc: Oral  PainSc: Asleep      Patients Stated Pain Goal: 4 (02/24/17 0820)  Complications: No apparent anesthesia complications

## 2017-02-28 NOTE — Anesthesia Procedure Notes (Signed)
Procedure Name: Intubation Date/Time: 02/28/2017 8:18 AM Performed by: Salli Quarry Alejandra Barna Pre-anesthesia Checklist: Patient identified, Emergency Drugs available, Suction available and Patient being monitored Patient Re-evaluated:Patient Re-evaluated prior to induction Oxygen Delivery Method: Circle System Utilized Preoxygenation: Pre-oxygenation with 100% oxygen Induction Type: IV induction Ventilation: Mask ventilation without difficulty Laryngoscope Size: Mac and 3 Grade View: Grade I Tube type: Oral Tube size: 7.0 mm Number of attempts: 1 Airway Equipment and Method: Stylet Placement Confirmation: ETT inserted through vocal cords under direct vision,  positive ETCO2 and breath sounds checked- equal and bilateral Secured at: 22 cm Tube secured with: Tape Dental Injury: Teeth and Oropharynx as per pre-operative assessment

## 2017-02-28 NOTE — Progress Notes (Signed)
PT Cancellation Note  Patient Details Name: Deborah [name] MRN: [id number] DOB: 1955-12-26   Cancelled Treatment:    Reason Eval/Treat Not Completed: Patient at procedure or test/unavailable. Pt currently off unit at a procedure. Will continue to follow and initiate PT eval at the next available time.   Marylynn PearsonLaura D Kaspian Muccio 02/28/2017, 7:13 AM   Conni SlipperLaura Kamaal Cast, PT, DPT Acute Rehabilitation Services Pager: [phone]

## 2017-03-01 ENCOUNTER — Encounter (HOSPITAL_COMMUNITY): Payer: Self-pay | Admitting: Orthopedic Surgery

## 2017-03-01 LAB — CBC
HCT: 31.3 % — ABNORMAL LOW (ref 36.0–46.0)
Hemoglobin: 10.5 g/dL — ABNORMAL LOW (ref 12.0–15.0)
MCH: 29.4 pg (ref 26.0–34.0)
MCHC: 33.5 g/dL (ref 30.0–36.0)
MCV: 87.7 fL (ref 78.0–100.0)
PLATELETS: 303 10*3/uL (ref 150–400)
RBC: 3.57 MIL/uL — ABNORMAL LOW (ref 3.87–5.11)
RDW: 14.5 % (ref 11.5–15.5)
WBC: 12.4 10*3/uL — AB (ref 4.0–10.5)

## 2017-03-01 LAB — BASIC METABOLIC PANEL
ANION GAP: 6 (ref 5–15)
BUN: 7 mg/dL (ref 6–20)
CALCIUM: 7.8 mg/dL — AB (ref 8.9–10.3)
CO2: 30 mmol/L (ref 22–32)
Chloride: 92 mmol/L — ABNORMAL LOW (ref 101–111)
Creatinine, Ser: 0.5 mg/dL (ref 0.44–1.00)
GLUCOSE: 105 mg/dL — AB (ref 65–99)
Potassium: 4.5 mmol/L (ref 3.5–5.1)
SODIUM: 128 mmol/L — AB (ref 135–145)

## 2017-03-01 MED ORDER — CHLORHEXIDINE GLUCONATE CLOTH 2 % EX PADS
6.0000 | MEDICATED_PAD | Freq: Every day | CUTANEOUS | Status: AC
  Administered 2017-03-01 – 2017-03-03 (×3): 6 via TOPICAL

## 2017-03-01 MED ORDER — SODIUM CHLORIDE 0.9 % IV SOLN
INTRAVENOUS | Status: DC
  Administered 2017-03-01 – 2017-03-02 (×2): via INTRAVENOUS

## 2017-03-01 MED ORDER — BOOST / RESOURCE BREEZE PO LIQD
1.0000 | Freq: Four times a day (QID) | ORAL | Status: DC
  Administered 2017-03-01 (×3): 1 via ORAL
  Administered 2017-03-02: 16:00:00 via ORAL
  Administered 2017-03-02 – 2017-03-15 (×25): 1 via ORAL

## 2017-03-01 MED ORDER — MUPIROCIN 2 % EX OINT
1.0000 "application " | TOPICAL_OINTMENT | Freq: Two times a day (BID) | CUTANEOUS | Status: AC
  Administered 2017-03-01 – 2017-03-05 (×9): 1 via NASAL
  Filled 2017-03-01 (×2): qty 22
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
## 2017-03-02 DIAGNOSIS — F419 Anxiety disorder, unspecified: Secondary | ICD-10-CM

## 2017-03-02 DIAGNOSIS — S82202A Unspecified fracture of shaft of left tibia, initial encounter for closed fracture: Secondary | ICD-10-CM

## 2017-03-02 DIAGNOSIS — Z811 Family history of alcohol abuse and dependence: Secondary | ICD-10-CM

## 2017-03-02 DIAGNOSIS — F17210 Nicotine dependence, cigarettes, uncomplicated: Secondary | ICD-10-CM

## 2017-03-02 DIAGNOSIS — S82201A Unspecified fracture of shaft of right tibia, initial encounter for closed fracture: Secondary | ICD-10-CM

## 2017-03-02 LAB — CBC
HEMATOCRIT: 25.6 % — AB (ref 36.0–46.0)
HEMOGLOBIN: 8.7 g/dL — AB (ref 12.0–15.0)
MCH: 29.9 pg (ref 26.0–34.0)
MCHC: 34 g/dL (ref 30.0–36.0)
MCV: 88 fL (ref 78.0–100.0)
Platelets: 280 10*3/uL (ref 150–400)
RBC: 2.91 MIL/uL — ABNORMAL LOW (ref 3.87–5.11)
RDW: 15 % (ref 11.5–15.5)
WBC: 11.2 10*3/uL — ABNORMAL HIGH (ref 4.0–10.5)

## 2017-03-02 LAB — CBC WITH DIFFERENTIAL/PLATELET
Basophils Absolute: 0 10*3/uL (ref 0.0–0.1)
Basophils Relative: 0 %
EOS ABS: 0.5 10*3/uL (ref 0.0–0.7)
EOS PCT: 6 %
HCT: 25.8 % — ABNORMAL LOW (ref 36.0–46.0)
Hemoglobin: 8.7 g/dL — ABNORMAL LOW (ref 12.0–15.0)
LYMPHS ABS: 1.6 10*3/uL (ref 0.7–4.0)
LYMPHS PCT: 16 %
MCH: 29.4 pg (ref 26.0–34.0)
MCHC: 33.7 g/dL (ref 30.0–36.0)
MCV: 87.2 fL (ref 78.0–100.0)
MONO ABS: 1 10*3/uL (ref 0.1–1.0)
MONOS PCT: 10 %
Neutro Abs: 6.7 10*3/uL (ref 1.7–7.7)
Neutrophils Relative %: 68 %
PLATELETS: 284 10*3/uL (ref 150–400)
RBC: 2.96 MIL/uL — AB (ref 3.87–5.11)
RDW: 14.8 % (ref 11.5–15.5)
WBC: 9.8 10*3/uL (ref 4.0–10.5)

## 2017-03-02 LAB — BASIC METABOLIC PANEL
Anion gap: 5 (ref 5–15)
BUN: 7 mg/dL (ref 6–20)
CALCIUM: 7.4 mg/dL — AB (ref 8.9–10.3)
CHLORIDE: 96 mmol/L — AB (ref 101–111)
CO2: 27 mmol/L (ref 22–32)
CREATININE: 0.42 mg/dL — AB (ref 0.44–1.00)
GFR calc non Af Amer: >60 mL/min (ref >60)
GLUCOSE: 115 mg/dL — AB (ref 65–99)
Potassium: 3.6 mmol/L (ref 3.5–5.1)
Sodium: 128 mmol/L — ABNORMAL LOW (ref 135–145)

## 2017-03-02 LAB — MAGNESIUM: Magnesium: 1.6 mg/dL — ABNORMAL LOW (ref 1.7–2.4)

## 2017-03-02 LAB — PROTIME-INR
INR: 1.39
PROTHROMBIN TIME: 17.2 s — AB (ref 11.4–15.2)

## 2017-03-02 MED ORDER — WARFARIN - PHARMACIST DOSING INPATIENT
Freq: Every day | Status: DC
  Administered 2017-03-02 – 2017-03-15 (×6)

## 2017-03-02 MED ORDER — WARFARIN SODIUM 2.5 MG PO TABS
2.5000 mg | ORAL_TABLET | Freq: Once | ORAL | Status: AC
  Administered 2017-03-02: 2.5 mg via ORAL
  Filled 2017-03-02: qty 1

## 2017-03-02 MED ORDER — BUPROPION HCL ER (XL) 150 MG PO TB24
300.0000 mg | ORAL_TABLET | Freq: Every day | ORAL | Status: DC
  Administered 2017-03-03 – 2017-03-16 (×13): 300 mg via ORAL
  Filled 2017-03-02 (×2): qty 2
  Filled 2017-03-02 (×2): qty 1
  Filled 2017-03-02 (×2): qty 2
  Filled 2017-03-02 (×4): qty 1
  Filled 2017-03-02: qty 2
  Filled 2017-03-02: qty 1
  Filled 2017-03-02: qty 2
  Filled 2017-03-02: qty 1
  Filled 2017-03-02: qty 2
  Filled 2017-03-02: qty 1

## 2017-03-02 MED ORDER — COUMADIN BOOK
Freq: Once | Status: AC
  Administered 2017-03-02: 18:00:00
  Filled 2017-03-02: qty 1

## 2017-03-02 MED ORDER — WARFARIN VIDEO
Freq: Once | Status: AC
  Administered 2017-03-02: 18:00:00

## 2017-03-02 MED ORDER — ENOXAPARIN SODIUM 40 MG/0.4ML ~~LOC~~ SOLN
40.0000 mg | SUBCUTANEOUS | Status: DC
  Administered 2017-03-03: 40 mg via SUBCUTANEOUS
  Filled 2017-03-02: qty 0.4

## 2017-03-02 MED ORDER — QUETIAPINE FUMARATE 25 MG PO TABS
25.0000 mg | ORAL_TABLET | Freq: Two times a day (BID) | ORAL | Status: DC
  Administered 2017-03-02: 25 mg via ORAL
  Filled 2017-03-02: qty 1
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
## 2017-03-03 LAB — CBC
HEMATOCRIT: 24 % — AB (ref 36.0–46.0)
Hemoglobin: 8.1 g/dL — ABNORMAL LOW (ref 12.0–15.0)
MCH: 29.2 pg (ref 26.0–34.0)
MCHC: 33.8 g/dL (ref 30.0–36.0)
MCV: 86.6 fL (ref 78.0–100.0)
PLATELETS: 281 10*3/uL (ref 150–400)
RBC: 2.77 MIL/uL — ABNORMAL LOW (ref 3.87–5.11)
RDW: 14.5 % (ref 11.5–15.5)
WBC: 9.5 10*3/uL (ref 4.0–10.5)

## 2017-03-03 LAB — BASIC METABOLIC PANEL
Anion gap: 5 (ref 5–15)
BUN: 6 mg/dL (ref 6–20)
CALCIUM: 7.6 mg/dL — AB (ref 8.9–10.3)
CO2: 27 mmol/L (ref 22–32)
CREATININE: 0.4 mg/dL — AB (ref 0.44–1.00)
Chloride: 93 mmol/L — ABNORMAL LOW (ref 101–111)
GFR calc Af Amer: >60 mL/min (ref >60)
GLUCOSE: 96 mg/dL (ref 65–99)
Potassium: 3.7 mmol/L (ref 3.5–5.1)
SODIUM: 125 mmol/L — AB (ref 135–145)

## 2017-03-03 LAB — PROTIME-INR
INR: 1.7
Prothrombin Time: 20.2 seconds — ABNORMAL HIGH (ref 11.4–15.2)

## 2017-03-03 MED ORDER — SODIUM CHLORIDE 0.9 % IV SOLN
INTRAVENOUS | Status: DC
  Administered 2017-03-03 – 2017-03-04 (×2): via INTRAVENOUS

## 2017-03-03 MED ORDER — POLYETHYLENE GLYCOL 3350 17 G PO PACK
17.0000 g | PACK | Freq: Every day | ORAL | Status: DC
  Administered 2017-03-03 – 2017-03-16 (×12): 17 g via ORAL
  Filled 2017-03-03 (×14): qty 1

## 2017-03-03 MED ORDER — ADULT MULTIVITAMIN W/MINERALS CH
1.0000 | ORAL_TABLET | Freq: Every day | ORAL | Status: DC
  Administered 2017-03-03 – 2017-03-16 (×13): 1 via ORAL
  Filled 2017-03-03 (×14): qty 1

## 2017-03-03 MED ORDER — WARFARIN SODIUM 2 MG PO TABS
2.0000 mg | ORAL_TABLET | Freq: Once | ORAL | Status: AC
  Administered 2017-03-03: 2 mg via ORAL
  Filled 2017-03-03: qty 1

## 2017-03-03 MED ORDER — FERROUS GLUCONATE 324 (38 FE) MG PO TABS
324.0000 mg | ORAL_TABLET | Freq: Two times a day (BID) | ORAL | Status: DC
  Administered 2017-03-03 – 2017-03-16 (×22): 324 mg via ORAL
  Filled 2017-03-03 (×30): qty 1

## 2017-03-03 MED ORDER — CLORAZEPATE DIPOTASSIUM 3.75 MG PO TABS
7.5000 mg | ORAL_TABLET | Freq: Three times a day (TID) | ORAL | Status: DC | PRN
  Administered 2017-03-04 – 2017-03-16 (×9): 7.5 mg via ORAL
  Filled 2017-03-03 (×11): qty 2
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
## 2017-03-03 NOTE — Discharge Instructions (Signed)
Information on my medicine - Coumadin®   (Warfarin) ° °Why was Coumadin prescribed for you? °Coumadin was prescribed for you because you have a blood clot or a medical condition that can cause an increased risk of forming blood clots. Blood clots can cause serious health problems by blocking the flow of blood to the heart, lung, or brain. Coumadin can prevent harmful blood clots from forming. °As a reminder your indication for Coumadin is:   Blood Clot Prevention After Orthopedic Surgery ° °What test will check on my response to Coumadin? °While on Coumadin (warfarin) you will need to have an INR test regularly to ensure that your dose is keeping you in the desired range. The INR (international normalized ratio) number is calculated from the result of the laboratory test called prothrombin time (PT). ° °If an INR APPOINTMENT HAS NOT ALREADY BEEN MADE FOR YOU please schedule an appointment to have this lab work done by your health care provider within 7 days. °Your INR goal is usually a number between:  2 to 3 or your provider may give you a more narrow range like 2-2.5.  Ask your health care provider during an office visit what your goal INR is. ° °What  do you need to  know  About  COUMADIN? °Take Coumadin (warfarin) exactly as prescribed by your healthcare provider about the same time each day.  DO NOT stop taking without talking to the doctor who prescribed the medication.  Stopping without other blood clot prevention medication to take the place of Coumadin may increase your risk of developing a new clot or stroke.  Get refills before you run out. ° °What do you do if you miss a dose? °If you miss a dose, take it as soon as you remember on the same day then continue your regularly scheduled regimen the next day.  Do not take two doses of Coumadin at the same time. ° °Important Safety Information °A possible side effect of Coumadin (Warfarin) is an increased risk of bleeding. You should call your healthcare  provider right away if you experience any of the following: °? Bleeding from an injury or your nose that does not stop. °? Unusual colored urine (red or dark brown) or unusual colored stools (red or black). °? Unusual bruising for unknown reasons. °? A serious fall or if you hit your head (even if there is no bleeding). ° °Some foods or medicines interact with Coumadin® (warfarin) and might alter your response to warfarin. To help avoid this: °? Eat a balanced diet, maintaining a consistent amount of Vitamin K. °? Notify your provider about major diet changes you plan to make. °? Avoid alcohol or limit your intake to 1 drink for women and 2 drinks for men per day. °(1 drink is 5 oz. wine, 12 oz. beer, or 1.5 oz. liquor.) ° °Make sure that ANY health care provider who prescribes medication for you knows that you are taking Coumadin (warfarin).  Also make sure the healthcare provider who is monitoring your Coumadin knows when you have started a new medication including herbals and non-prescription products. ° °Coumadin® (Warfarin)  Major Drug Interactions  °Increased Warfarin Effect Decreased Warfarin Effect  °Alcohol (large quantities) °Antibiotics (esp. Septra/Bactrim, Flagyl, Cipro) °Amiodarone (Cordarone) °Aspirin (ASA) °Cimetidine (Tagamet) °Megestrol (Megace) °NSAIDs (ibuprofen, naproxen, etc.) °Piroxicam (Feldene) °Propafenone (Rythmol SR) °Propranolol (Inderal) °Isoniazid (INH) °Posaconazole (Noxafil) Barbiturates (Phenobarbital) °Carbamazepine (Tegretol) °Chlordiazepoxide (Librium) °Cholestyramine (Questran) °Griseofulvin °Oral Contraceptives °Rifampin °Sucralfate (Carafate) °Vitamin K  ° °Coumadin® (Warfarin) Major Herbal   Interactions  °Increased Warfarin Effect Decreased Warfarin Effect  °Garlic °Ginseng °Ginkgo biloba Coenzyme Q10 °Green tea °St. John’s wort   ° °Coumadin® (Warfarin) FOOD Interactions  °Eat a consistent number of servings per week of foods HIGH in Vitamin K °(1 serving = ½ cup)  °Collards  (cooked, or boiled & drained) °Kale (cooked, or boiled & drained) °Mustard greens (cooked, or boiled & drained) °Parsley *serving size only = ¼ cup °Spinach (cooked, or boiled & drained) °Swiss chard (cooked, or boiled & drained) °Turnip greens (cooked, or boiled & drained)  °Eat a consistent number of servings per week of foods MEDIUM-HIGH in Vitamin K °(1 serving = 1 cup)  °Asparagus (cooked, or boiled & drained) °Broccoli (cooked, boiled & drained, or raw & chopped) °Brussel sprouts (cooked, or boiled & drained) *serving size only = ½ cup °Lettuce, raw (green leaf, endive, romaine) °Spinach, raw °Turnip greens, raw & chopped  ° °These websites have more information on Coumadin (warfarin):  www.coumadin.com; °www.ahrq.gov/consumer/coumadin.htm; ° ° °
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
## 2017-03-04 LAB — CBC
HCT: 25.2 % — ABNORMAL LOW (ref 36.0–46.0)
Hemoglobin: 8.6 g/dL — ABNORMAL LOW (ref 12.0–15.0)
MCH: 29.1 pg (ref 26.0–34.0)
MCHC: 34.1 g/dL (ref 30.0–36.0)
MCV: 85.1 fL (ref 78.0–100.0)
PLATELETS: 338 10*3/uL (ref 150–400)
RBC: 2.96 MIL/uL — AB (ref 3.87–5.11)
RDW: 14.6 % (ref 11.5–15.5)
WBC: 10.6 10*3/uL — AB (ref 4.0–10.5)

## 2017-03-04 LAB — BASIC METABOLIC PANEL
ANION GAP: 6 (ref 5–15)
BUN: 7 mg/dL (ref 6–20)
CALCIUM: 7.6 mg/dL — AB (ref 8.9–10.3)
CO2: 27 mmol/L (ref 22–32)
Chloride: 91 mmol/L — ABNORMAL LOW (ref 101–111)
Creatinine, Ser: 0.37 mg/dL — ABNORMAL LOW (ref 0.44–1.00)
GLUCOSE: 113 mg/dL — AB (ref 65–99)
POTASSIUM: 3.7 mmol/L (ref 3.5–5.1)
SODIUM: 124 mmol/L — AB (ref 135–145)

## 2017-03-04 LAB — PROTIME-INR
INR: 3.84
Prothrombin Time: 38.8 seconds — ABNORMAL HIGH (ref 11.4–15.2)

## 2017-03-04 MED ORDER — METHOCARBAMOL 500 MG PO TABS: 500.0000 mg | ORAL_TABLET | Freq: Three times a day (TID) | ORAL | Status: DC | PRN

## 2017-03-04 MED ORDER — HYDROMORPHONE HCL 1 MG/ML IJ SOLN
0.5000 mg | Freq: Four times a day (QID) | INTRAMUSCULAR | Status: DC | PRN
  Administered 2017-03-04 (×2): 1 mg via INTRAVENOUS
  Administered 2017-03-05 – 2017-03-06 (×3): 0.5 mg via INTRAVENOUS
  Filled 2017-03-04 (×2): qty 1
  Filled 2017-03-04 (×3): qty 0.5
  Filled 2017-03-04: qty 1

## 2017-03-04 MED ORDER — OXYCODONE HCL 5 MG PO TABS
5.0000 mg | ORAL_TABLET | ORAL | Status: DC | PRN
  Administered 2017-03-04 – 2017-03-07 (×9): 5 mg via ORAL
  Filled 2017-03-04 (×9): qty 1

## 2017-03-04 NOTE — Progress Notes (Signed)
Pt assisted to bedpan as foley was removed at 10:00 this morning and she has not yet voided. Pt stated that she felt like she could void, but after bedpan placed, she would gaze/doze off.  Frequently reminded/encouraged to urinate.  Allowed to sit for about 25 minutes but did not urinate.  Bladder scan showed 540cc.  General surgery paged.

## 2017-03-04 NOTE — Progress Notes (Signed)
Foley catheter discontinued as per verbal order for voiding trial from B. Meuth, PA.  Pt due to void by 1600.

## 2017-03-04 NOTE — Progress Notes (Signed)
Orthopedic Tech Progress Note Patient Details:  Deborah [name] 12/07/1955 [id number]  Ortho Devices Type of Ortho Device:  (prafo boot) Ortho Device/Splint Location: bilateral Ortho Device/Splint Interventions: Application   [name] 03/04/2017, 7:21 AM  

## 2017-03-04 NOTE — Progress Notes (Signed)
Orthopedic Tech Progress Note Patient Details:  Darrick GrinderBernadette M Durnell 02-25-1956 [id number]  Ortho Devices Type of Ortho Device:  (prafo boot) Ortho Device/Splint Location: bilateral Ortho Device/Splint Interventions: Application   Kazi Montoro 03/04/2017, 7:21 AM
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
## 2017-03-05 ENCOUNTER — Inpatient Hospital Stay (HOSPITAL_COMMUNITY): Payer: No Typology Code available for payment source

## 2017-03-05 LAB — BASIC METABOLIC PANEL
Anion gap: 6 (ref 5–15)
BUN: 6 mg/dL (ref 6–20)
CALCIUM: 7.9 mg/dL — AB (ref 8.9–10.3)
CO2: 29 mmol/L (ref 22–32)
CREATININE: 0.42 mg/dL — AB (ref 0.44–1.00)
Chloride: 86 mmol/L — ABNORMAL LOW (ref 101–111)
Glucose, Bld: 104 mg/dL — ABNORMAL HIGH (ref 65–99)
Potassium: 4 mmol/L (ref 3.5–5.1)
SODIUM: 121 mmol/L — AB (ref 135–145)

## 2017-03-05 LAB — URINALYSIS, MICROSCOPIC (REFLEX): RBC / HPF: NONE SEEN RBC/hpf (ref 0–5)

## 2017-03-05 LAB — CBC
HEMATOCRIT: 25.2 % — AB (ref 36.0–46.0)
Hemoglobin: 8.7 g/dL — ABNORMAL LOW (ref 12.0–15.0)
MCH: 29.6 pg (ref 26.0–34.0)
MCHC: 34.5 g/dL (ref 30.0–36.0)
MCV: 85.7 fL (ref 78.0–100.0)
PLATELETS: 400 10*3/uL (ref 150–400)
RBC: 2.94 MIL/uL — ABNORMAL LOW (ref 3.87–5.11)
RDW: 14.5 % (ref 11.5–15.5)
WBC: 11.5 10*3/uL — AB (ref 4.0–10.5)

## 2017-03-05 LAB — URINALYSIS, ROUTINE W REFLEX MICROSCOPIC
Bilirubin Urine: NEGATIVE
Glucose, UA: NEGATIVE mg/dL
Hgb urine dipstick: NEGATIVE
KETONES UR: NEGATIVE mg/dL
NITRITE: NEGATIVE
PH: 8 (ref 5.0–8.0)
Protein, ur: NEGATIVE mg/dL
Specific Gravity, Urine: 1.01 (ref 1.005–1.030)

## 2017-03-05 LAB — PROTIME-INR
INR: 2.49
PROTHROMBIN TIME: 27.4 s — AB (ref 11.4–15.2)

## 2017-03-05 MED ORDER — BETHANECHOL CHLORIDE 5 MG PO TABS
5.0000 mg | ORAL_TABLET | Freq: Three times a day (TID) | ORAL | Status: DC
  Administered 2017-03-05 – 2017-03-16 (×32): 5 mg via ORAL
  Filled 2017-03-05 (×36): qty 1

## 2017-03-05 MED ORDER — LORAZEPAM 2 MG/ML IJ SOLN
1.0000 mg | INTRAMUSCULAR | Status: DC | PRN
  Administered 2017-03-07 – 2017-03-15 (×14): 1 mg via INTRAVENOUS
  Filled 2017-03-05 (×14): qty 1

## 2017-03-05 MED ORDER — METHOCARBAMOL 500 MG PO TABS
500.0000 mg | ORAL_TABLET | Freq: Three times a day (TID) | ORAL | Status: DC
  Administered 2017-03-05 – 2017-03-16 (×33): 500 mg via ORAL
  Filled 2017-03-05 (×35): qty 1

## 2017-03-05 MED ORDER — WARFARIN SODIUM 1 MG PO TABS
1.0000 mg | ORAL_TABLET | Freq: Once | ORAL | Status: AC
  Administered 2017-03-05: 1 mg via ORAL
  Filled 2017-03-05: qty 1

## 2017-03-05 NOTE — Progress Notes (Addendum)
Pt in bed, found to have removed IV, foley and tele leads.  Mittens replaced as well as items removed by patient.  Pt having visual hallucinations of rats and alligators.  Clorazepate administered for agitation/anxiety

## 2017-03-05 NOTE — Progress Notes (Signed)
5 Days Post-Op   Subjective/Chief Complaint: She does know if her nose is deviated. No clear fluid from the nose. Lip is healing   Objective: Vital signs in last 24 hours: Temp:  [97.7 F (36.5 C)-98.8 F (37.1 C)] 98.3 F (36.8 C) (08/05 0800) Pulse Rate:  [69-77] 69 (08/05 0400) Resp:  [11-15] 14 (08/05 0800) BP: (107-144)/(66-84) 135/82 (08/05 0800) SpO2:  [97 %-100 %] 97 % (08/05 0800) Last BM Date:  (unknown)  Intake/Output from previous day: 08/04 0701 - 08/05 0700 In: 518.8 [I.V.:518.8] Out: 2250 [Urine:2250] Intake/Output this shift: Total I/O In: -  Out: 400 [Urine:400]  she is without evidence of CSF leak. her nose is by palpation depressed on the left and extruding on the right but appearance is close to normal. lip is healing well. no infection  Lab Results:   Recent Labs  03/04/17 0500 03/05/17 0237  WBC 10.6* 11.5*  HGB 8.6* 8.7*  HCT 25.2* 25.2*  PLT 338 400   BMET  Recent Labs  03/04/17 0500 03/05/17 0237  NA 124* 121*  K 3.7 4.0  CL 91* 86*  CO2 27 29  GLUCOSE 113* 104*  BUN 7 6  CREATININE 0.37* 0.42*  CALCIUM 7.6* 7.9*   PT/INR  Recent Labs  03/04/17 0500 03/05/17 0237  LABPROT 38.8* 27.4*  INR 3.84 2.49   ABG No results for input(s): PHART, HCO3 in the last 72 hours.  Invalid input(s): PCO2, PO2  Studies/Results: No results found.  Anti-infectives: Anti-infectives    Start     Dose/Rate Route Frequency Ordered Stop   02/28/17 1800  ceFAZolin (ANCEF) IVPB 1 g/50 mL premix     1 g 100 mL/hr over 30 Minutes Intravenous Every 8 hours 02/28/17 1310 03/02/17 1100   02/24/17 1500  vancomycin (VANCOCIN) powder  Status:  Discontinued       As needed 02/24/17 1500 02/24/17 1645   02/24/17 1500  gentamicin (GARAMYCIN) injection  Status:  Discontinued       As needed 02/24/17 1500 02/24/17 1645   02/23/17 0200  ceFAZolin (ANCEF) IVPB 1 g/50 mL premix  Status:  Discontinued     1 g 100 mL/hr over 30 Minutes Intravenous Every 8  hours 02/22/17 2342 02/28/17 1310      Assessment/Plan: s/p Procedure(s): REMOVAL EXTERNAL FIXATION LEG (Bilateral) OPEN REDUCTION INTERNAL FIXATION (ORIF) TIBIA FRACTURE (Bilateral) will need sutures out of lip but i need better scissors than floor so will order from OR. She needs to decide about her nose as to whether it is deviated to perform closed reduction.   LOS: 11 days    Suzanna ObeyBYERS, Laureano Hetzer 03/05/2017
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
## 2017-03-06 LAB — CBC
HEMATOCRIT: 25.2 % — AB (ref 36.0–46.0)
HEMOGLOBIN: 8.7 g/dL — AB (ref 12.0–15.0)
MCH: 29.6 pg (ref 26.0–34.0)
MCHC: 34.5 g/dL (ref 30.0–36.0)
MCV: 85.7 fL (ref 78.0–100.0)
Platelets: 254 10*3/uL (ref 150–400)
RBC: 2.94 MIL/uL — ABNORMAL LOW (ref 3.87–5.11)
RDW: 14.7 % (ref 11.5–15.5)
WBC: 10.6 10*3/uL — AB (ref 4.0–10.5)

## 2017-03-06 LAB — BASIC METABOLIC PANEL
ANION GAP: 7 (ref 5–15)
BUN: 5 mg/dL — ABNORMAL LOW (ref 6–20)
CALCIUM: 8.1 mg/dL — AB (ref 8.9–10.3)
CO2: 28 mmol/L (ref 22–32)
Chloride: 90 mmol/L — ABNORMAL LOW (ref 101–111)
Creatinine, Ser: 0.47 mg/dL (ref 0.44–1.00)
GFR calc non Af Amer: >60 mL/min (ref >60)
Glucose, Bld: 109 mg/dL — ABNORMAL HIGH (ref 65–99)
Potassium: 3.8 mmol/L (ref 3.5–5.1)
SODIUM: 125 mmol/L — AB (ref 135–145)

## 2017-03-06 LAB — PROTIME-INR
INR: 1.73
PROTHROMBIN TIME: 20.5 s — AB (ref 11.4–15.2)

## 2017-03-06 MED ORDER — TRAMADOL HCL 50 MG PO TABS
50.0000 mg | ORAL_TABLET | Freq: Four times a day (QID) | ORAL | Status: DC
  Administered 2017-03-06 – 2017-03-16 (×37): 50 mg via ORAL
  Filled 2017-03-06 (×38): qty 1

## 2017-03-06 MED ORDER — WARFARIN SODIUM 2 MG PO TABS
2.0000 mg | ORAL_TABLET | Freq: Once | ORAL | Status: AC
  Administered 2017-03-06: 2 mg via ORAL
  Filled 2017-03-06: qty 1

## 2017-03-06 NOTE — Care Management Important Message (Signed)
Important Message  Patient Details  Name: Deborah [name] MRN: [id number] Date of Birth: 1955-08-12   Medicare Important Message Given:  Yes    Kyla BalzarineShealy, Clyde Upshaw Abena 03/06/2017, 10:32 AM
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
## 2017-03-07 LAB — BASIC METABOLIC PANEL
ANION GAP: 8 (ref 5–15)
BUN: 7 mg/dL (ref 6–20)
CHLORIDE: 92 mmol/L — AB (ref 101–111)
CO2: 27 mmol/L (ref 22–32)
Calcium: 8.4 mg/dL — ABNORMAL LOW (ref 8.9–10.3)
Creatinine, Ser: 0.42 mg/dL — ABNORMAL LOW (ref 0.44–1.00)
Glucose, Bld: 105 mg/dL — ABNORMAL HIGH (ref 65–99)
POTASSIUM: 3.9 mmol/L (ref 3.5–5.1)
SODIUM: 127 mmol/L — AB (ref 135–145)

## 2017-03-07 LAB — PROTIME-INR
INR: 1.73
Prothrombin Time: 20.5 seconds — ABNORMAL HIGH (ref 11.4–15.2)

## 2017-03-07 MED ORDER — WARFARIN SODIUM 2 MG PO TABS
2.0000 mg | ORAL_TABLET | Freq: Once | ORAL | Status: AC
  Administered 2017-03-07: 2 mg via ORAL
  Filled 2017-03-07: qty 1

## 2017-03-07 MED ORDER — HALOPERIDOL LACTATE 5 MG/ML IJ SOLN
5.0000 mg | Freq: Once | INTRAMUSCULAR | Status: AC
  Administered 2017-03-07: 5 mg via INTRAVENOUS
  Filled 2017-03-07: qty 1

## 2017-03-07 MED ORDER — CEFADROXIL 500 MG PO CAPS: 500.0000 mg | ORAL_CAPSULE | Freq: Two times a day (BID) | ORAL | Status: DC

## 2017-03-07 MED ORDER — CEPHALEXIN 500 MG PO CAPS
500.0000 mg | ORAL_CAPSULE | Freq: Four times a day (QID) | ORAL | Status: DC
  Administered 2017-03-07 – 2017-03-16 (×34): 500 mg via ORAL
  Filled 2017-03-07 (×35): qty 1
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
## 2017-03-07 NOTE — [place] FL2 (Signed)
[place] MEDICA[place] FL2 LEVEL OF CARE SCREENING TOOL     IDENTIFICATION  Patient Name: Deborah [name] Birthdate: 1955-10-23 Sex: female Admission Date (Current Location): 02/22/2017  [facility] and [place] Number:  Producer, television/film/video and Address:  [facility] [place], [address] [place], [place] [address]      Provider Number: [id number]  Attending Physician Name and Address:  Md, Trauma, MD  Relative Name and Phone Number:       Current Level of Care: Hospital Recommended Level of Care: Skilled Nursing Facility Prior Approval Number:    Date Approved/Denied:   PASRR Number:    Discharge Plan: SNF    Current Diagnoses: Patient Active Problem List   Diagnosis Date Noted  . Bilateral tibial fractures 02/22/2017  . MDD (major depressive disorder), recurrent severe, without psychosis (HCC) 01/26/2017  . CONTUSION OF TOE 02/04/2010  . INSOMNIA 01/26/2010  . ACTINIC KERATOSIS 08/28/2009  . GROIN STRAIN, RIGHT 04/22/2009  . LACERATION, FOOT 02/03/2009  . ABDOMINAL PAIN 12/01/2008  . CONTUSION OF FOOT 11/03/2008  . LOW BACK PAIN 10/02/2008  . BACK PAIN 09/01/2008  . LUMBAR STRAIN 06/20/2008  . ANIMAL BITE 02/18/2008  . URI 10/31/2007  . RESTLESS LEG SYNDROME, MILD 06/26/2007  . DEPRESSION 06/25/2007    Orientation RESPIRATION BLADDER Height & Weight     Self, Place  Normal Indwelling catheter Weight: 115 lb 9.6 oz (52.4 kg) Height:  5' (152.4 cm)  BEHAVIORAL SYMPTOMS/MOOD NEUROLOGICAL BOWEL NUTRITION STATUS      Continent Diet (Soft Diet / Thin Liquid)  AMBULATORY STATUS COMMUNICATION OF NEEDS Skin   Extensive Assist Verbally Surgical wounds (Right and Left Leg Incisions)                       Personal Care Assistance Level of Assistance  Bathing, Feeding, Dressing Bathing Assistance: Maximum assistance Feeding assistance: Maximum assistance Dressing Assistance: Maximum assistance     Functional Limitations Info  Sight,  Hearing, Speech Sight Info: Adequate Hearing Info: Adequate Speech Info: Adequate    SPECIAL CARE FACTORS FREQUENCY  PT (By licensed PT), OT (By licensed OT)     PT Frequency: 3x week OT Frequency: 3x week            Contractures Contractures Info: Not present    Additional Factors Info  Code Status, Allergies, Psychotropic Code Status Info: Full Code Allergies Info: No Known Allergies Psychotropic Info: Wellbutrin         Current Medications (03/07/2017):  This is the current hospital active medication list Current Facility-Administered Medications  Medication Dose Route Frequency Provider Last Rate Last Dose  . acetaminophen (TYLENOL) tablet 650 mg  650 mg Oral Q6H Phylliss Blakes A, MD   650 mg at 03/07/17 1014  . bacitracin ointment   Topical BID Berna Bue, MD   1 application at 03/07/17 1015  . bethanechol (URECHOLINE) tablet 5 mg  5 mg Oral TID Meuth, Brooke A, PA-C   5 mg at 03/07/17 1014  . Brexpiprazole TABS 2 mg  2 mg Oral QHS Jimmye Norman, MD   2 mg at 03/06/17 2154  . buPROPion (WELLBUTRIN XL) 24 hr tablet 300 mg  300 mg Oral Daily Rayburn, Kelly A, PA-C   300 mg at 03/07/17 1014  . chlorhexidine (PERIDEX) 0.12 % solution 15 mL  15 mL Mouth Rinse BID Violeta Gelinas, MD   15 mL at 03/07/17 1014  . clorazepate (TRANXENE) tablet 7.5 mg  7.5 mg Oral TID PRN Emi HolesMarkle, Jennifer S, RPH   7.5 mg at 03/07/17 1222  . docusate sodium (COLACE) capsule 100 mg  100 mg Oral BID Jimmye NormanWyatt, James, MD   100 mg at 03/07/17 1014  . feeding supplement (BOOST / RESOURCE BREEZE) liquid 1 Container  1 Container Oral QID Berna Bueonnor, Chelsea A, MD   1 Container at 03/07/17 1000  . ferrous gluconate (FERGON) tablet 324 mg  324 mg Oral BID WC Jimmye NormanWyatt, James, MD   324 mg at 03/07/17 1014  . hydrALAZINE (APRESOLINE) injection 10 mg  10 mg Intravenous Q2H PRN Jimmye NormanWyatt, James, MD      . LORazepam (ATIVAN) injection 1 mg  1 mg Intravenous Q4H PRN Harriette Bouillonornett, Thomas, MD   1 mg at 03/07/17 0949  . MEDLINE  mouth rinse  15 mL Mouth Rinse q12n4p Violeta Gelinashompson, Burke, MD   15 mL at 03/06/17 1600  . methocarbamol (ROBAXIN) tablet 500 mg  500 mg Oral Q8H Meuth, Brooke A, PA-C   500 mg at 03/07/17 0605  . multivitamin with minerals tablet 1 tablet  1 tablet Oral Daily Jimmye NormanWyatt, James, MD   1 tablet at 03/07/17 1014  . ondansetron (ZOFRAN-ODT) disintegrating tablet 4 mg  4 mg Oral Q6H PRN Jimmye NormanWyatt, James, MD       Or  . ondansetron [facility](ZOFRAN) injection 4 mg  4 mg Intravenous Q6H PRN Jimmye NormanWyatt, James, MD      . oxyCODONE (Oxy IR/ROXICODONE) immediate release tablet 5 mg  5 mg Oral Q4H PRN Meuth, Brooke A, PA-C   5 mg at 03/07/17 0948  . pantoprazole (PROTONIX) EC tablet 40 mg  40 mg Oral Daily Jimmye NormanWyatt, James, MD   40 mg at 03/07/17 1022  . polyethylene glycol (MIRALAX / GLYCOLAX) packet 17 g  17 g Oral Daily Jimmye NormanWyatt, James, MD   17 g at 03/07/17 1014  . prochlorperazine (COMPAZINE) tablet 10 mg  10 mg Oral Q6H PRN Jimmye NormanWyatt, James, MD       Or  . prochlorperazine (COMPAZINE) injection 5-10 mg  5-10 mg Intravenous Q6H PRN Jimmye NormanWyatt, James, MD      . traMADol Janean Sark(ULTRAM) tablet 50 mg  50 mg Oral Q6H Violeta Gelinashompson, Burke, MD   50 mg at 03/07/17 1239  . Vilazodone HCl (VIIBRYD) TABS 40 mg  40 mg Oral Daily Jimmye NormanWyatt, James, MD   40 mg at 03/07/17 1014  . warfarin (COUMADIN) tablet 2 mg  2 mg Oral ONCE-1800 Emi HolesMarkle, Jennifer S, [place]      . Warfarin - Pharmacist Dosing Inpatient   Does not apply q1800 Myrene GalasHandy, Michael, MD       Facility-Administered Medications Ordered in Other Encounters  Medication Dose Route Frequency Provider Last Rate Last Dose  . fentaNYL (SUBLIMAZE) injection   Intravenous PRN Charlynne PanderYao, David Hsienta, MD   50 mcg at 02/22/17 2139     Discharge Medications: Please see discharge summary for a list of discharge medications.  Relevant Imaging Results:  Relevant Lab Results:   Additional Information SSN [id number]   Macario GoldsJesse Mikalah Skyles, [place] [phone]

## 2017-03-07 NOTE — Progress Notes (Signed)
PT Cancellation Note  Patient Details Name: Deborah GrinderBernadette M Pagaduan MRN: [id number] DOB: 07/20/1956   Cancelled Treatment:    Reason Eval/Treat Not Completed: Pain limiting ability to participate Pt screaming when attempting to mobilize bilat LE. RN giving pain medication. PT will check on pt later as time allows.    Derek MoundKellyn R Taylia Berber Savion Washam, PTA Pager: [phone]   03/07/2017, 10:02 AM

## 2017-03-07 NOTE — Progress Notes (Signed)
PT Cancellation Note  Patient Details Name: Darrick GrinderBernadette M Lust MRN: [id number] DOB: 05-29-1956   Cancelled Treatment:    Reason Eval/Treat Not Completed: Pain limiting ability to participate PT will continue to follow acutely.    Derek MoundKellyn R Brianni Manthe Altovise Wahler, PTA Pager: [phone]   03/07/2017, 1:55 PM

## 2017-03-07 NOTE — Progress Notes (Signed)
Trauma Service Note  Subjective: Patient is crying and sad this AM because she wants to get out of the hospital.  Not very aware of entire circumstances and how they relate to overall disposition.  Objective: Vital signs in last 24 hours: Temp:  [97.9 F (36.6 C)-98.2 F (36.8 C)] 98.2 F (36.8 C) (08/07 0800) Pulse Rate:  [84] 84 (08/07 0800) Resp:  [15-18] 16 (08/07 0800) BP: (123-132)/(73-81) 132/81 (08/07 0800) SpO2:  [97 %] 97 % (08/07 0800) Last BM Date:  (unknown)  Intake/Output from previous day: 08/06 0701 - 08/07 0700 In: 120 [P.O.:120] Out: 3900 [Urine:3900] Intake/Output this shift: No intake/output data recorded.  General: Crying, but not having a lot of pain.  Lungs: Clear  Abd: Benign  Extremities: Both are wrapped in ACE bandages.  VAC on one leg to be removed by ortho today.  Neuro: Oriented and appropriate, depressed and saddened.  Lab Results: CBC   Recent Labs  03/05/17 0237 03/06/17 0339  WBC 11.5* 10.6*  HGB 8.7* 8.7*  HCT 25.2* 25.2*  PLT 400 254   BMET  Recent Labs  03/06/17 0339 03/07/17 0330  NA 125* 127*  K 3.8 3.9  CL 90* 92*  CO2 28 27  GLUCOSE 109* 105*  BUN 5* 7  CREATININE 0.47 0.42*  CALCIUM 8.1* 8.4*   PT/INR  Recent Labs  03/06/17 0339 03/07/17 0330  LABPROT 20.5* 20.5*  INR 1.73 1.73   ABG No results for input(s): PHART, HCO3 in the last 72 hours.  Invalid input(s): PCO2, PO2  Studies/Results: Dg Chest Port 1 View  Result Date: 03/05/2017 CLINICAL DATA:  Leukocytosis. EXAM: PORTABLE CHEST 1 VIEW COMPARISON:  Chest x-ray dated 02/22/2017. FINDINGS: Heart size and mediastinal contours are within normal limits, stable given the slightly oblique positioning on today's exam. Lungs appear clear. No pleural effusion or pneumothorax seen. Osseous structures about the chest are unremarkable. IMPRESSION: No active disease. No evidence of pneumonia or pulmonary edema seen. Electronically Signed   By: Bary RichardStan  Maynard  M.D.   On: 03/05/2017 10:50    Anti-infectives: Anti-infectives    Start     Dose/Rate Route Frequency Ordered Stop   02/28/17 1800  ceFAZolin (ANCEF) IVPB 1 g/50 mL premix     1 g 100 mL/hr over 30 Minutes Intravenous Every 8 hours 02/28/17 1310 03/02/17 1100   02/24/17 1500  vancomycin (VANCOCIN) powder  Status:  Discontinued       As needed 02/24/17 1500 02/24/17 1645   02/24/17 1500  gentamicin (GARAMYCIN) injection  Status:  Discontinued       As needed 02/24/17 1500 02/24/17 1645   02/23/17 0200  ceFAZolin (ANCEF) IVPB 1 g/50 mL premix  Status:  Discontinued     1 g 100 mL/hr over 30 Minutes Intravenous Every 8 hours 02/22/17 2342 02/28/17 1310      Assessment/Plan: s/p Procedure(s): REMOVAL EXTERNAL FIXATION LEG OPEN REDUCTION INTERNAL FIXATION (ORIF) TIBIA FRACTURE Advance diet Disposition dependent upon whether or not the patient needs more surgery on her legs.  She can tranfer out of 4E to 5N  LOS: 13 days   Marta LamasJames O. Gae BonWyatt, III, MD, [name] [phone] Trauma Surgeon 03/07/2017
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
## 2017-03-07 NOTE — Clinical Social Work Placement (Signed)
   CLINICAL SOCIAL WORK PLACEMENT  NOTE  Date:  03/07/2017  Patient Details  Name: Deborah [name] MRN: [id number] Date of Birth: [birth year]  Clinical Social Work is seeking post-discharge placement for this patient at the Skilled  Nursing Facility level of care (*CSW will initial, date and re-position this form in  chart as items are completed):  Yes   Patient/family provided with [place] Clinical Social Work Department's list of facilities offering this level of care within the geographic area requested by the patient (or if unable, by the patient's family).  Yes   Patient/family informed of their freedom to choose among providers that offer the needed level of care, that participate in Medicare, Medicaid or managed care program needed by the patient, have an available bed and are willing to accept the patient.  Yes   Patient/family informed of [facility]'s ownership interest in [facility] and [facility], as well as of the fact that they are under no obligation to receive care at these facilities.  PASRR submitted to EDS on 03/07/17     PASRR number received on       Existing PASRR number confirmed on       FL2 transmitted to all facilities in geographic area requested by pt/family on 03/07/17     FL2 transmitted to all facilities within larger geographic area on       Patient informed that his/her managed care company has contracts with or will negotiate with certain facilities, including the following:            Patient/family informed of bed offers received.  Patient chooses bed at       Physician recommends and patient chooses bed at      Patient to be transferred to   on  .  Patient to be transferred to facility by       Patient family notified on   of transfer.  Name of family member notified:        PHYSICIAN Please sign FL2     Additional Comment:    Macario GoldsJesse Shraddha Lebron, LCSW [phone]
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
## 2017-03-08 LAB — CBC
HCT: 26.2 % — ABNORMAL LOW (ref 36.0–46.0)
Hemoglobin: 8.7 g/dL — ABNORMAL LOW (ref 12.0–15.0)
MCH: 29.3 pg (ref 26.0–34.0)
MCHC: 33.2 g/dL (ref 30.0–36.0)
MCV: 88.2 fL (ref 78.0–100.0)
PLATELETS: 544 10*3/uL — AB (ref 150–400)
RBC: 2.97 MIL/uL — ABNORMAL LOW (ref 3.87–5.11)
RDW: 15.5 % (ref 11.5–15.5)
WBC: 14.3 10*3/uL — AB (ref 4.0–10.5)

## 2017-03-08 LAB — PROTIME-INR
INR: 2.22
Prothrombin Time: 25 seconds — ABNORMAL HIGH (ref 11.4–15.2)

## 2017-03-08 LAB — BASIC METABOLIC PANEL
Anion gap: 8 (ref 5–15)
BUN: 8 mg/dL (ref 6–20)
CALCIUM: 8.8 mg/dL — AB (ref 8.9–10.3)
CO2: 27 mmol/L (ref 22–32)
CREATININE: 0.47 mg/dL (ref 0.44–1.00)
Chloride: 91 mmol/L — ABNORMAL LOW (ref 101–111)
GFR calc Af Amer: >60 mL/min (ref >60)
GLUCOSE: 114 mg/dL — AB (ref 65–99)
Potassium: 4.1 mmol/L (ref 3.5–5.1)
Sodium: 126 mmol/L — ABNORMAL LOW (ref 135–145)

## 2017-03-08 MED ORDER — WARFARIN SODIUM 1 MG PO TABS
1.0000 mg | ORAL_TABLET | Freq: Once | ORAL | Status: AC
  Administered 2017-03-08: 1 mg via ORAL
  Filled 2017-03-08: qty 1

## 2017-03-08 MED ORDER — OXYCODONE HCL 5 MG PO TABS
5.0000 mg | ORAL_TABLET | ORAL | Status: DC | PRN
  Administered 2017-03-09 – 2017-03-16 (×24): 5 mg via ORAL
  Filled 2017-03-08 (×25): qty 1

## 2017-03-08 NOTE — Progress Notes (Signed)
ANTICOAGULATION CONSULT NOTE - Follow Up Consult  Pharmacy Consult for Coumadin Indication: VTE prophylaxis  No Known Allergies  Patient Measurements: Height: 5' (152.4 cm) Weight: 115 lb 9.6 oz (52.4 kg) IBW/kg (Calculated) : 45.5   Vital Signs: Temp: 99.1 F (37.3 C) (08/08 0800) Temp Source: Oral (08/08 0800) BP: 119/79 (08/08 0800) Pulse Rate: 78 (08/08 0800)  Labs:  Recent Labs  03/06/17 0339 03/07/17 0330 03/08/17 0231  HGB 8.7*  --  8.7*  HCT 25.2*  --  26.2*  PLT 254  --  544*  LABPROT 20.5* 20.5* 25.0*  INR 1.73 1.73 2.22  CREATININE 0.47 0.42*  --     Estimated Creatinine Clearance: 53.7 mL/min (A) (by C-G formula based on SCr of 0.42 mg/dL (L)).   Assessment:  Anticoag: Started on coumadin for VTE px on 8/2, will need 8 weeks d/t severe mobility restriction. Baseline INR was 1.75 and repeat 1.39? No known liver dysfunction.  INR jumped up to 3.84 on 8/4 after only 2 doses of warfarin. Enoxaparin bridge was stopped. Patient potentially very sensitive to warfarin. INR 2.49 > 1.73 > 1.73>2.22 today. CBC stable  Goal of Therapy:  INR 2-3 Monitor platelets by anticoagulation protocol: Yes   Plan:  Warfarin 1mg  tonight Monitor daily INR, CBC, s/s of bleed   Samirah Scarpati S. Merilynn Finlandobertson, PharmD, BCPS Clinical Staff Pharmacist Pager [phone]  Misty Stanleyobertson, Danielle Mink Stillinger 03/08/2017,10:09 AM

## 2017-03-08 NOTE — Progress Notes (Signed)
Per previous RN, wound care was completed this morning by morning RN. EPIC was down.

## 2017-03-08 NOTE — NC FL2 (Signed)
[place] MEDICAID FL2 LEVEL OF CARE SCREENING TOOL     IDENTIFICATION  Patient Name: Deborah [name] Birthdate: 11-13-1955 Sex: female Admission Date (Current Location): 02/22/2017  [facility] and [place] Number:  Producer, television/film/video and Address:  [facility] [place]. [facility], [address], [place], [place] [address]      Provider Number: [id number]  Attending Physician Name and Address:  Md, Trauma, MD  Relative Name and Phone Number:       Current Level of Care: Hospital Recommended Level of Care: Skilled Nursing Facility Prior Approval Number:    Date Approved/Denied:   PASRR Number: [id number] (Expires 04/07/2017)  Discharge Plan: SNF    Current Diagnoses: Patient Active Problem List   Diagnosis Date Noted  . Bilateral tibial fractures 02/22/2017  . MDD (major depressive disorder), recurrent severe, without psychosis (HCC) 01/26/2017  . CONTUSION OF TOE 02/04/2010  . INSOMNIA 01/26/2010  . ACTINIC KERATOSIS 08/28/2009  . GROIN STRAIN, RIGHT 04/22/2009  . LACERATION, FOOT 02/03/2009  . ABDOMINAL PAIN 12/01/2008  . CONTUSION OF FOOT 11/03/2008  . LOW BACK PAIN 10/02/2008  . BACK PAIN 09/01/2008  . LUMBAR STRAIN 06/20/2008  . ANIMAL BITE 02/18/2008  . URI 10/31/2007  . RESTLESS LEG SYNDROME, MILD 06/26/2007  . DEPRESSION 06/25/2007    Orientation RESPIRATION BLADDER Height & Weight     Self, Place  Normal Indwelling catheter Weight: 115 lb 9.6 oz (52.4 kg) Height:  5' (152.4 cm)  BEHAVIORAL SYMPTOMS/MOOD NEUROLOGICAL BOWEL NUTRITION STATUS      Continent Diet (Soft Diet / Thin Liquid)  AMBULATORY STATUS COMMUNICATION OF NEEDS Skin   Extensive Assist Verbally Surgical wounds (Right and Left Leg Incisions)                       Personal Care Assistance Level of Assistance  Bathing, Feeding, Dressing Bathing Assistance: Maximum assistance Feeding assistance: Maximum assistance Dressing Assistance: Maximum assistance     Functional  Limitations Info  Sight, Hearing, Speech Sight Info: Adequate Hearing Info: Adequate Speech Info: Adequate    SPECIAL CARE FACTORS FREQUENCY  PT (By licensed PT), OT (By licensed OT)     PT Frequency: 3x week OT Frequency: 3x week            Contractures Contractures Info: Not present    Additional Factors Info  Code Status, Allergies, Psychotropic Code Status Info: Full Code Allergies Info: No Known Allergies Psychotropic Info: Wellbutrin         Current Medications (03/08/2017):  This is the current hospital active medication list Current Facility-Administered Medications  Medication Dose Route Frequency Provider Last Rate Last Dose  . acetaminophen (TYLENOL) tablet 650 mg  650 mg Oral Q6H Phylliss Blakes A, MD   650 mg at 03/08/17 0525  . bacitracin ointment   Topical BID Phylliss Blakes A, MD      . bethanechol (URECHOLINE) tablet 5 mg  5 mg Oral TID Meuth, Brooke A, PA-C   5 mg at 03/07/17 2225  . Brexpiprazole TABS 2 mg  2 mg Oral QHS Jimmye Norman, MD   2 mg at 03/07/17 2225  . buPROPion (WELLBUTRIN XL) 24 hr tablet 300 mg  300 mg Oral Daily Rayburn, Kelly A, PA-C   300 mg at 03/07/17 1014  . cephALEXin (KEFLEX) capsule 500 mg  500 mg Oral Q6H Montez Morita, PA-C   500 mg at 03/08/17 7846  . chlorhexidine (PERIDEX) 0.12 % solution 15 mL  15 mL Mouth  Rinse BID Violeta Gelinashompson, Burke, MD   15 mL at 03/07/17 2225  . clorazepate (TRANXENE) tablet 7.5 mg  7.5 mg Oral TID PRN Emi HolesMarkle, Jennifer S, RPH   7.5 mg at 03/07/17 1222  . docusate sodium (COLACE) capsule 100 mg  100 mg Oral BID Jimmye NormanWyatt, James, MD   100 mg at 03/07/17 2225  . feeding supplement (BOOST / RESOURCE BREEZE) liquid 1 Container  1 Container Oral QID Berna Bueonnor, Chelsea A, MD   1 Container at 03/07/17 1800  . ferrous gluconate (FERGON) tablet 324 mg  324 mg Oral BID WC Jimmye NormanWyatt, James, MD   324 mg at 03/07/17 1718  . hydrALAZINE (APRESOLINE) injection 10 mg  10 mg Intravenous Q2H PRN Jimmye NormanWyatt, James, MD      . LORazepam (ATIVAN)  injection 1 mg  1 mg Intravenous Q4H PRN Cornett, Maisie Fushomas, MD   1 mg at 03/07/17 1423  . MEDLINE mouth rinse  15 mL Mouth Rinse q12n4p Violeta Gelinashompson, Burke, MD   15 mL at 03/06/17 1600  . methocarbamol (ROBAXIN) tablet 500 mg  500 mg Oral Q8H Meuth, Brooke A, PA-C   500 mg at 03/08/17 0525  . multivitamin with minerals tablet 1 tablet  1 tablet Oral Daily Jimmye NormanWyatt, James, MD   1 tablet at 03/07/17 1014  . ondansetron (ZOFRAN-ODT) disintegrating tablet 4 mg  4 mg Oral Q6H PRN Jimmye NormanWyatt, James, MD       Or  . ondansetron [facility](ZOFRAN) injection 4 mg  4 mg Intravenous Q6H PRN Jimmye NormanWyatt, James, MD      . oxyCODONE (Oxy IR/ROXICODONE) immediate release tablet 5 mg  5 mg Oral Q4H PRN Meuth, Brooke A, PA-C   5 mg at 03/07/17 1718  . pantoprazole (PROTONIX) EC tablet 40 mg  40 mg Oral Daily Jimmye NormanWyatt, James, MD   40 mg at 03/07/17 1022  . polyethylene glycol (MIRALAX / GLYCOLAX) packet 17 g  17 g Oral Daily Jimmye NormanWyatt, James, MD   17 g at 03/07/17 1014  . prochlorperazine (COMPAZINE) tablet 10 mg  10 mg Oral Q6H PRN Jimmye NormanWyatt, James, MD       Or  . prochlorperazine (COMPAZINE) injection 5-10 mg  5-10 mg Intravenous Q6H PRN Jimmye NormanWyatt, James, MD      . traMADol Janean Sark(ULTRAM) tablet 50 mg  50 mg Oral Q6H Violeta Gelinashompson, Burke, MD   50 mg at 03/08/17 0525  . Vilazodone HCl (VIIBRYD) TABS 40 mg  40 mg Oral Daily Jimmye NormanWyatt, James, MD   40 mg at 03/07/17 1014  . Warfarin - Pharmacist Dosing Inpatient   Does not apply q1800 Myrene GalasHandy, Michael, MD       Facility-Administered Medications Ordered in Other Encounters  Medication Dose Route Frequency Provider Last Rate Last Dose  . fentaNYL (SUBLIMAZE) injection   Intravenous PRN Charlynne PanderYao, David Hsienta, MD   50 mcg at 02/22/17 2139     Discharge Medications: Please see discharge summary for a list of discharge medications.  Relevant Imaging Results:  Relevant Lab Results:   Additional Information SSN [id number]   Macario GoldsJesse Eluterio Seymour, [place] [phone]

## 2017-03-08 NOTE — Progress Notes (Signed)
Orthopedic Tech Progress Note Patient Details:  Darrick GrinderBernadette M Fleek May 29, 1956 [id number]  Ortho Devices Type of Ortho Device: Abduction pillow Ortho Device/Splint Location: Provided Hip Abduction Pillow as requested by floor nurse.  Nurse stated she will apply device without Ortho Tech assistance.  Hip Abduction pillow provided at bedside.  Ortho Device/Splint Interventions: Application   Alvina ChouWilliams, Viha Kriegel C 03/08/2017, 1:03 PM

## 2017-03-08 NOTE — Progress Notes (Signed)
Trauma Service Note  Subjective: Patient continues to be very tearful.  But will respond appropriately with the sister at the bedside.  Objective: Vital signs in last 24 hours: Temp:  [97.8 F (36.6 C)-99.5 F (37.5 C)] 99.1 F (37.3 C) (08/08 0800) Pulse Rate:  [77-97] 78 (08/08 0800) Resp:  [14-21] 21 (08/08 0800) BP: (107-137)/(70-88) 119/79 (08/08 0800) SpO2:  [95 %-99 %] 97 % (08/08 0800) Last BM Date: 03/05/17  Intake/Output from previous day: 08/07 0701 - 08/08 0700 In: 360 [P.O.:360] Out: 2100 [Urine:2100] Intake/Output this shift: No intake/output data recorded.  General: No acute distress.  Just worked with PT  Lungs: Clear  Abd: Benign  Extremities: ACE wraps are off LE.  NPWD is off.  Neuro: Intact  Renal:  Question as to whether or not the patient was able to void without the catheter.  Lab Results: CBC   Recent Labs  03/06/17 0339 03/08/17 0231  WBC 10.6* 14.3*  HGB 8.7* 8.7*  HCT 25.2* 26.2*  PLT 254 544*   BMET  Recent Labs  03/06/17 0339 03/07/17 0330  NA 125* 127*  K 3.8 3.9  CL 90* 92*  CO2 28 27  GLUCOSE 109* 105*  BUN 5* 7  CREATININE 0.47 0.42*  CALCIUM 8.1* 8.4*   PT/INR  Recent Labs  03/07/17 0330 03/08/17 0231  LABPROT 20.5* 25.0*  INR 1.73 2.22   ABG No results for input(s): PHART, HCO3 in the last 72 hours.  Invalid input(s): PCO2, PO2  Studies/Results: No results found.  Anti-infectives: Anti-infectives    Start     Dose/Rate Route Frequency Ordered Stop   03/07/17 2200  cefadroxil (DURICEF) capsule 500 mg  Status:  Discontinued     500 mg Oral Every 12 hours 03/07/17 1605 03/07/17 1705   03/07/17 1800  cephALEXin (KEFLEX) capsule 500 mg     500 mg Oral Every 6 hours 03/07/17 1707     02/28/17 1800  ceFAZolin (ANCEF) IVPB 1 g/50 mL premix     1 g 100 mL/hr over 30 Minutes Intravenous Every 8 hours 02/28/17 1310 03/02/17 1100   02/24/17 1500  vancomycin (VANCOCIN) powder  Status:  Discontinued     As needed 02/24/17 1500 02/24/17 1645   02/24/17 1500  gentamicin (GARAMYCIN) injection  Status:  Discontinued       As needed 02/24/17 1500 02/24/17 1645   02/23/17 0200  ceFAZolin (ANCEF) IVPB 1 g/50 mL premix  Status:  Discontinued     1 g 100 mL/hr over 30 Minutes Intravenous Every 8 hours 02/22/17 2342 02/28/17 1310      Assessment/Plan: s/p Procedure(s): REMOVAL EXTERNAL FIXATION LEG OPEN REDUCTION INTERNAL FIXATION (ORIF) TIBIA FRACTURE May have to replace the catheter.  Check labs for hyponatremia  LOS: 14 days   Marta LamasJames O. Gae BonWyatt, III, MD, [name] [phone] Trauma Surgeon 03/08/2017

## 2017-03-08 NOTE — Progress Notes (Signed)
Bladder scan greater than 777. Patient on bed pan attempting to void

## 2017-03-09 LAB — PROTIME-INR
INR: 2.42
Prothrombin Time: 26.7 seconds — ABNORMAL HIGH (ref 11.4–15.2)

## 2017-03-09 LAB — BASIC METABOLIC PANEL
Anion gap: 11 (ref 5–15)
BUN: 13 mg/dL (ref 6–20)
CHLORIDE: 93 mmol/L — AB (ref 101–111)
CO2: 27 mmol/L (ref 22–32)
CREATININE: 0.44 mg/dL (ref 0.44–1.00)
Calcium: 8.4 mg/dL — ABNORMAL LOW (ref 8.9–10.3)
GFR calc Af Amer: >60 mL/min (ref >60)
GFR calc non Af Amer: >60 mL/min (ref >60)
GLUCOSE: 100 mg/dL — AB (ref 65–99)
POTASSIUM: 4.2 mmol/L (ref 3.5–5.1)
SODIUM: 131 mmol/L — AB (ref 135–145)

## 2017-03-09 LAB — URINALYSIS, ROUTINE W REFLEX MICROSCOPIC
BILIRUBIN URINE: NEGATIVE
GLUCOSE, UA: NEGATIVE mg/dL
HGB URINE DIPSTICK: NEGATIVE
KETONES UR: NEGATIVE mg/dL
LEUKOCYTES UA: NEGATIVE
Nitrite: NEGATIVE
PH: 7 (ref 5.0–8.0)
PROTEIN: NEGATIVE mg/dL
Specific Gravity, Urine: 1.015 (ref 1.005–1.030)

## 2017-03-09 MED ORDER — TAMSULOSIN HCL 0.4 MG PO CAPS
0.4000 mg | ORAL_CAPSULE | Freq: Every day | ORAL | Status: DC
  Administered 2017-03-09 – 2017-03-16 (×7): 0.4 mg via ORAL
  Filled 2017-03-09 (×8): qty 1

## 2017-03-09 MED ORDER — WARFARIN SODIUM 1 MG PO TABS
1.0000 mg | ORAL_TABLET | Freq: Once | ORAL | Status: AC
  Administered 2017-03-09: 1 mg via ORAL
  Filled 2017-03-09: qty 1
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
## 2017-03-09 NOTE — Care Management Important Message (Signed)
Important Message  Patient Details  Name: Deborah [name] MRN: [id number] Date of Birth: November 20, 1955   Medicare Important Message Given:  Yes    Kyla BalzarineShealy, Rosemond Lyttle Abena 03/09/2017, 9:51 AM
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
## 2017-03-10 LAB — CBC
HCT: 24.8 % — ABNORMAL LOW (ref 36.0–46.0)
Hemoglobin: 8.1 g/dL — ABNORMAL LOW (ref 12.0–15.0)
MCH: 29.2 pg (ref 26.0–34.0)
MCHC: 32.7 g/dL (ref 30.0–36.0)
MCV: 89.5 fL (ref 78.0–100.0)
Platelets: 583 10*3/uL — ABNORMAL HIGH (ref 150–400)
RBC: 2.77 MIL/uL — ABNORMAL LOW (ref 3.87–5.11)
RDW: 15.8 % — ABNORMAL HIGH (ref 11.5–15.5)
WBC: 9.8 10*3/uL (ref 4.0–10.5)

## 2017-03-10 LAB — PROTIME-INR
INR: 1.62
PROTHROMBIN TIME: 19.4 s — AB (ref 11.4–15.2)

## 2017-03-10 MED ORDER — WARFARIN SODIUM 2 MG PO TABS
2.0000 mg | ORAL_TABLET | Freq: Once | ORAL | Status: AC
  Administered 2017-03-10: 2 mg via ORAL
  Filled 2017-03-10: qty 1

## 2017-03-10 MED ORDER — BREXPIPRAZOLE 1 MG PO TABS
2.0000 mg | ORAL_TABLET | Freq: Every day | ORAL | Status: DC
  Administered 2017-03-10 – 2017-03-15 (×6): 2 mg via ORAL
  Filled 2017-03-10 (×7): qty 2
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
## 2017-03-10 NOTE — Progress Notes (Signed)
Received call that patient was on the floor. Patient was found lying on her back on the floor next to her bed. Bed alarm was on last time RN and NT left room, but did not go off when pt got out of bed. Patient stated she was trying to go to the bathroom and take off her boots. Also stated she did not hit her head, and that she fell on her right side. No obvious signs of new trauma noted, and patient is not complaining of any pain anywhere. BP 112/67 HR 76 O2 100% RA. Patient is back in bed, resting comfortably. Will continue to monitor.
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
## 2017-03-10 NOTE — Clinical Social Work Note (Signed)
Clinical Social Worker continuing to follow patient and family for support and discharge planning needs.  CSW provided patient sister with additional bed offers per her request.  Patient sister is hopeful to go visit facilities tomorrow and make a bed choice.  Patient sister requested to speak with attending MD - CSW notified MD.  CSW remains available for support and to facilitate patient discharge needs.  Macario GoldsJesse Salsabeel [place], [place]CS[place] [phone]

## 2017-03-10 NOTE — Progress Notes (Signed)
Transferred to 5N room 09 with all of patients belonging by bed.

## 2017-03-11 LAB — PROTIME-INR
INR: 1.48
Prothrombin Time: 18.1 seconds — ABNORMAL HIGH (ref 11.4–15.2)

## 2017-03-11 MED ORDER — WARFARIN SODIUM 2 MG PO TABS
2.0000 mg | ORAL_TABLET | Freq: Once | ORAL | Status: AC
  Administered 2017-03-11: 2 mg via ORAL
  Filled 2017-03-11: qty 1
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
## 2017-03-11 NOTE — Progress Notes (Signed)
Patient used abduction pillow for about 3 hours on this shift and then she removed it and refused.  She also refused the Prafo boots.  Patient was educated on both items and still refused.

## 2017-03-12 ENCOUNTER — Inpatient Hospital Stay (HOSPITAL_COMMUNITY): Payer: No Typology Code available for payment source

## 2017-03-12 LAB — PROTIME-INR
INR: 1.46
Prothrombin Time: 17.8 seconds — ABNORMAL HIGH (ref 11.4–15.2)

## 2017-03-12 MED ORDER — WARFARIN SODIUM 3 MG PO TABS
3.0000 mg | ORAL_TABLET | Freq: Once | ORAL | Status: AC
  Administered 2017-03-12: 3 mg via ORAL
  Filled 2017-03-12: qty 1

## 2017-03-12 NOTE — Progress Notes (Signed)
Patient has been tearful, whinning calling for her cats. Pt has declined foot pumps. Leg splints. Patient has refused oral rinse, miralax, boost supplement

## 2017-03-12 NOTE — Clinical Social Work Note (Signed)
Clinical Social Worker continuing to follow patient and family for support and discharge planning needs.  CSW spoke with patient sister, Neomia DearUna over the phone to once again discuss bed offers.  Patient sister remains undecided and still visiting facilities.  Patient sister did state that they are pursuing an attorney to assist with possible placement options.  Patient sister states that patient TBI is still too severe for discharge and is waiting on further clarification from the MD.  CSW reiterated the importance of locating a facility and determining patient plans at discharge regardless of time frame.  CSW remains available for support and to facilitate patient discharge needs once medically stable.  Deborah [name], [place] [phone]
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
## 2017-03-12 NOTE — Progress Notes (Signed)
Patient has been very tearful, crying out, restless in spite of IV ativan as rx'd. Pt has thrown her legs over the bedrails in attempt to get out of bed.  Pt has not actually slept this shift. Spoke with sister Minus LibertyRosie and asked if there was a family member that could stay the night with pt. Bed alarm is on but sounding frequently due to restlessness of pt. Family agreeable to stay the night.  Pt declining most meals, will take fluids.  Pt has declined hair care. Pulling on foley cath, foley removed as pt is causing trauma to insertion site. Will observe for voiding.Marland Kitchen..Marland Kitchen

## 2017-03-13 ENCOUNTER — Encounter (HOSPITAL_COMMUNITY): Payer: Self-pay | Admitting: *Deleted

## 2017-03-13 LAB — PROTIME-INR
INR: 1.43
PROTHROMBIN TIME: 17.5 s — AB (ref 11.4–15.2)

## 2017-03-13 MED ORDER — WARFARIN SODIUM 3 MG PO TABS
3.0000 mg | ORAL_TABLET | Freq: Once | ORAL | Status: AC
  Administered 2017-03-13: 3 mg via ORAL
  Filled 2017-03-13: qty 1

## 2017-03-13 NOTE — Care Management Important Message (Signed)
Important Message  Patient Details  Name: Deborah GrinderBernadette M Krul MRN: [id number] Date of Birth: [birth year]   Medicare Important Message Given:  Yes    [place] 03/13/2017, 11:30 AM
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
## 2017-03-13 NOTE — Progress Notes (Signed)
Discussed with Trauma team at rounds with Dr. Lindie SpruceWyatt and RN CM, Raynelle FanningJulie. Pt without caregiver support at home after any short rehab stay. Therapy has recommended SNF rehab as pt is NWB BLE. Sister is requesting inpt rehab for not satisfied with SNF rehab choices. Recommend clarification of pt's capacity to make decisions so that sister can be decision maker. Pt not a candidate for an inpt rehab admission at this time. [phone]
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
## 2017-03-14 DIAGNOSIS — F332 Major depressive disorder, recurrent severe without psychotic features: Secondary | ICD-10-CM

## 2017-03-14 LAB — CBC
HEMATOCRIT: 25.2 % — AB (ref 36.0–46.0)
HEMOGLOBIN: 8.1 g/dL — AB (ref 12.0–15.0)
MCH: 29.7 pg (ref 26.0–34.0)
MCHC: 32.1 g/dL (ref 30.0–36.0)
MCV: 92.3 fL (ref 78.0–100.0)
Platelets: 573 10*3/uL — ABNORMAL HIGH (ref 150–400)
RBC: 2.73 MIL/uL — AB (ref 3.87–5.11)
RDW: 16.5 % — ABNORMAL HIGH (ref 11.5–15.5)
WBC: 8.7 10*3/uL (ref 4.0–10.5)

## 2017-03-14 LAB — BASIC METABOLIC PANEL
Anion gap: 9 (ref 5–15)
BUN: 11 mg/dL (ref 6–20)
CHLORIDE: 103 mmol/L (ref 101–111)
CO2: 26 mmol/L (ref 22–32)
CREATININE: 0.56 mg/dL (ref 0.44–1.00)
Calcium: 8.5 mg/dL — ABNORMAL LOW (ref 8.9–10.3)
GFR calc Af Amer: >60 mL/min (ref >60)
GFR calc non Af Amer: >60 mL/min (ref >60)
Glucose, Bld: 139 mg/dL — ABNORMAL HIGH (ref 65–99)
POTASSIUM: 3.7 mmol/L (ref 3.5–5.1)
SODIUM: 138 mmol/L (ref 135–145)

## 2017-03-14 LAB — PROTIME-INR
INR: 1.78
Prothrombin Time: 21 seconds — ABNORMAL HIGH (ref 11.4–15.2)

## 2017-03-14 MED ORDER — WARFARIN SODIUM 3 MG PO TABS
3.0000 mg | ORAL_TABLET | Freq: Once | ORAL | Status: AC
  Administered 2017-03-14: 3 mg via ORAL
  Filled 2017-03-14: qty 1
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
## 2017-03-14 NOTE — Progress Notes (Signed)
Patient continued to refuse scheduled meds, notified MD.
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
## 2017-03-14 NOTE — Progress Notes (Signed)
Patient refused to take scheduled meds in the morning, notified MD. Patient added that she does not want to talk to her sister and not to disclose any information.

## 2017-03-15 LAB — PROTIME-INR
INR: 1.86
PROTHROMBIN TIME: 21.7 s — AB (ref 11.4–15.2)

## 2017-03-15 MED ORDER — CEPHALEXIN 500 MG PO CAPS: 500.0000 mg | ORAL_CAPSULE | Freq: Four times a day (QID) | ORAL | Status: AC

## 2017-03-15 MED ORDER — ORAL CARE MOUTH RINSE: 15.0000 mL | Freq: Two times a day (BID) | OROMUCOSAL | 0 refills | Status: DC

## 2017-03-15 MED ORDER — BOOST / RESOURCE BREEZE PO LIQD: 1.0000 | Freq: Four times a day (QID) | ORAL | 0 refills | Status: DC

## 2017-03-15 MED ORDER — METHOCARBAMOL 500 MG PO TABS: 500.0000 mg | ORAL_TABLET | Freq: Three times a day (TID) | ORAL | 0 refills | Status: DC

## 2017-03-15 MED ORDER — DOCUSATE SODIUM 100 MG PO CAPS: 100.0000 mg | ORAL_CAPSULE | Freq: Two times a day (BID) | ORAL | 0 refills | Status: DC

## 2017-03-15 MED ORDER — BETHANECHOL CHLORIDE 5 MG PO TABS: 5.0000 mg | ORAL_TABLET | Freq: Three times a day (TID) | ORAL | Status: DC

## 2017-03-15 MED ORDER — OXYCODONE HCL 5 MG PO TABS: 5.0000 mg | ORAL_TABLET | ORAL | 0 refills | Status: DC | PRN

## 2017-03-15 MED ORDER — BUPROPION HCL ER (XL) 300 MG PO TB24: 300.0000 mg | ORAL_TABLET | Freq: Every day | ORAL | Status: DC

## 2017-03-15 MED ORDER — TRAMADOL HCL 50 MG PO TABS: 50.0000 mg | ORAL_TABLET | Freq: Four times a day (QID) | ORAL | 0 refills | Status: DC

## 2017-03-15 MED ORDER — FERROUS GLUCONATE 324 (38 FE) MG PO TABS: 324.0000 mg | ORAL_TABLET | Freq: Two times a day (BID) | ORAL | 3 refills | Status: DC

## 2017-03-15 MED ORDER — WARFARIN SODIUM 3 MG PO TABS
3.0000 mg | ORAL_TABLET | Freq: Once | ORAL | Status: AC
  Administered 2017-03-15: 3 mg via ORAL
  Filled 2017-03-15: qty 1

## 2017-03-15 MED ORDER — CLORAZEPATE DIPOTASSIUM 7.5 MG PO TABS: 7.5000 mg | ORAL_TABLET | Freq: Three times a day (TID) | ORAL | 3 refills | Status: DC | PRN

## 2017-03-15 MED ORDER — CHLORHEXIDINE GLUCONATE 0.12 % MT SOLN: 15.0000 mL | Freq: Two times a day (BID) | OROMUCOSAL | 0 refills | Status: DC

## 2017-03-15 MED ORDER — TAMSULOSIN HCL 0.4 MG PO CAPS: 0.4000 mg | ORAL_CAPSULE | Freq: Every day | ORAL | Status: DC

## 2017-03-15 MED ORDER — WARFARIN SODIUM 3 MG PO TABS: 3.0000 mg | ORAL_TABLET | Freq: Once | ORAL | Status: DC

## 2017-03-15 MED ORDER — BACITRACIN ZINC 500 UNIT/GM EX OINT: TOPICAL_OINTMENT | Freq: Two times a day (BID) | CUTANEOUS | 0 refills | Status: DC

## 2017-03-15 MED ORDER — ADULT MULTIVITAMIN W/MINERALS CH: 1.0000 | ORAL_TABLET | Freq: Every day | ORAL | Status: DC

## 2017-03-15 MED ORDER — ACETAMINOPHEN 325 MG PO TABS: 650.0000 mg | ORAL_TABLET | Freq: Four times a day (QID) | ORAL | Status: DC

## 2017-03-15 MED ORDER — PANTOPRAZOLE SODIUM 40 MG PO TBEC: 40.0000 mg | DELAYED_RELEASE_TABLET | Freq: Every day | ORAL | Status: DC

## 2017-03-15 MED ORDER — POLYETHYLENE GLYCOL 3350 17 G PO PACK: 17.0000 g | PACK | Freq: Every day | ORAL | 0 refills | Status: DC

## 2017-03-15 NOTE — Social Work (Addendum)
CSW spoke with sister numerous times today to discuss SNF placement. Sister still unable to select a place. She indicated that [facility] did not have a bed available and she drove al the way there. CSW discussed with her the importance of selecting a SNF. She indicated that she was heading to [facility]. CSW advised her that [facility] did not offer a bed. Sister was adamant that [facility] did make bed offer.  CSW then called [facility] and Lurena JoinerRebecca confirmed that they did [facility]. CSW advised that patient sister not pleased [facility].  CSW then called Thayer OhmChris at [facility] and he confirmed that they have a bed available but it is semi private.  CSW will f/u with sister on placement.  CSW called sister three times and she did not answer call.  CSW then called Lurena JoinerRebecca back at [facility] and she indicated that sister did not complete paperwork and advised them that [facility] w[facility] and patient wld be coming tomorrow.  CSW will continue to f/u for DC to SNF.  Keene BreathPatricia Miki Labuda, LCSW Clinical Social Worker [phone]
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
## 2017-03-15 NOTE — Progress Notes (Signed)
Trauma CSW and this case manager met with pt, mother and sister to discuss disposition.  Informed them that pt is medically stable for discharge, and that decision needs to be made today regarding SNF bed.  Sister states that they are interested in [facility], though this was never a bed offer, and she was informed of this. Sister states she has not had time to visit all of the facilities offered, though she was given offers on 03/08/17 by CSW.  Notified pt and her family that pt is medically stable, and insurance likely to not pay for pt to stay in hospital past this date.  Psych has seen pt and she has been found to have capacity.  Sister has gone to visit [facility] in [place].  CSW to follow up with her this afternoon on decision.    Reinaldo Raddle, RN, BSN  Trauma/Neuro ICU Case Manager [phone]
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
## 2017-03-16 ENCOUNTER — Other Ambulatory Visit: Payer: Self-pay

## 2017-03-16 LAB — PROTIME-INR
INR: 1.85
Prothrombin Time: 21.6 seconds — ABNORMAL HIGH (ref 11.4–15.2)

## 2017-03-16 MED ORDER — BREXPIPRAZOLE 2 MG PO TABS: 2.0000 mg | ORAL_TABLET | Freq: Every day | ORAL | Status: DC

## 2017-03-16 MED ORDER — WARFARIN SODIUM 3 MG PO TABS: 4.5000 mg | ORAL_TABLET | Freq: Once | ORAL | Status: DC

## 2017-03-16 MED ORDER — TRAMADOL HCL 50 MG PO TABS: 50.0000 mg | ORAL_TABLET | Freq: Four times a day (QID) | ORAL | 0 refills | Status: DC

## 2017-03-16 MED ORDER — WARFARIN SODIUM 2.5 MG PO TABS
4.5000 mg | ORAL_TABLET | Freq: Once | ORAL | Status: DC
  Filled 2017-03-16: qty 1

## 2017-03-16 MED ORDER — BREXPIPRAZOLE 1 MG PO TABS: 2.0000 mg | ORAL_TABLET | Freq: Every day | ORAL | Status: DC

## 2017-03-16 MED ORDER — OXYCODONE HCL 5 MG PO TABS: 5.0000 mg | ORAL_TABLET | ORAL | 0 refills | Status: DC | PRN

## 2017-03-16 MED ORDER — CLORAZEPATE DIPOTASSIUM 7.5 MG PO TABS: 7.5000 mg | ORAL_TABLET | Freq: Three times a day (TID) | ORAL | 0 refills | Status: DC | PRN

## 2017-03-16 NOTE — Telephone Encounter (Signed)
RX faxed to [facility] @ [phone], phone number [phone] 

## 2017-03-16 NOTE — Progress Notes (Signed)
Attempted to deliver a HINN letter to patient/family, however, sister not at bedside and is now not answering her phone.  Though pt has been deemed to have capacity by psych MD, I do not feel comfortable having pt sign HINN letter, as she still is having periods of confusion and hallucinations.  Will address disposition issues in AM.    Quintella BatonJulie W. Thoams Siefert, RN, BSN  Trauma/Neuro ICU Case Manager [phone]
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
## 2017-03-16 NOTE — Clinical Social Work Placement (Signed)
   CLINICAL SOCIAL WORK PLACEMENT  NOTE  Date:  03/16/2017  Patient Details  Name: Deborah [name] MRN: [id number] Date of Birth: 1956-04-14  Clinical Social Work is seeking post-discharge placement for this patient at the Skilled  Nursing Facility level of care (*CSW will initial, date and re-position this form in  chart as items are completed):  Yes   Patient/family provided with [place] Clinical Social Work Department's list of facilities offering this level of care within the geographic area requested by the patient (or if unable, by the patient's family).  Yes   Patient/family informed of their freedom to choose among providers that offer the needed level of care, that participate in Medicare, Medicaid or managed care program needed by the patient, have an available bed and are willing to accept the patient.  Yes   Patient/family informed of [facility]'s ownership interest in [facility] and [facility], as well as of the fact that they are under no obligation to receive care at these facilities.  PASRR submitted to EDS on 03/07/17     PASRR number received on       Existing PASRR number confirmed on       FL2 transmitted to all facilities in geographic area requested by pt/family on 03/07/17     FL2 transmitted to all facilities within larger geographic area on       Patient informed that his/her managed care company has contracts with or will negotiate with certain facilities, including the following:        Yes   Patient/family informed of bed offers received.  [facility]     Physician recommends and patient chooses bed at      [facility]17.  Patient to be transferred to facility by Ambulance     Patient family notified on 03/16/17 of transfer.  Name of family member notified:  Maebelle MunroeUna Insley - sister at bedside     PHYSICIAN Please sign FL2, Please  prepare priority discharge summary, including medications     Additional Comment:    Macario GoldsJesse Sheryl Saintil, LCSW [phone]

## 2017-03-16 NOTE — Progress Notes (Signed)
Discharge instructions discussed with pt and sister Neomia DearUna, pt transported in stable condition via cart with transporters x 2 at bedside, Rx's taken to SNF via transport.
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
## 2017-03-16 NOTE — Clinical Social Work Note (Signed)
Clinical Social Worker facilitated patient discharge including contacting patient family and facility to confirm patient discharge plans.  Clinical information faxed to facility and family agreeable with plan.  CSW arranged ambulance transport via [facility] to [facility] between 1:30-2:00pm.  RN to call report prior to discharge.  Clinical Social Worker will sign off for now as social work intervention is no longer needed. Please consult us again if new need arises.  Macario GoldsJesse Keidrick Murty, KentuckyLCSW [phone]

## 2017-03-16 NOTE — Progress Notes (Signed)
Report called to [facility]ika at [facility]. [phone].  [id number]
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
## 2017-03-17 ENCOUNTER — Other Ambulatory Visit: Payer: Self-pay

## 2017-03-17 ENCOUNTER — Non-Acute Institutional Stay (SKILLED_NURSING_FACILITY): Payer: Medicare Other | Admitting: Adult Health

## 2017-03-17 ENCOUNTER — Encounter: Payer: Self-pay | Admitting: Adult Health

## 2017-03-17 DIAGNOSIS — T402X5A Adverse effect of other opioids, initial encounter: Secondary | ICD-10-CM | POA: Diagnosis not present

## 2017-03-17 DIAGNOSIS — S82202C Unspecified fracture of shaft of left tibia, initial encounter for open fracture type IIIA, IIIB, or IIIC: Secondary | ICD-10-CM | POA: Diagnosis not present

## 2017-03-17 DIAGNOSIS — K219 Gastro-esophageal reflux disease without esophagitis: Secondary | ICD-10-CM

## 2017-03-17 DIAGNOSIS — D62 Acute posthemorrhagic anemia: Secondary | ICD-10-CM | POA: Diagnosis not present

## 2017-03-17 DIAGNOSIS — Z8781 Personal history of (healed) traumatic fracture: Secondary | ICD-10-CM

## 2017-03-17 DIAGNOSIS — Z967 Presence of other bone and tendon implants: Secondary | ICD-10-CM

## 2017-03-17 DIAGNOSIS — R339 Retention of urine, unspecified: Secondary | ICD-10-CM

## 2017-03-17 DIAGNOSIS — Z7901 Long term (current) use of anticoagulants: Secondary | ICD-10-CM | POA: Diagnosis not present

## 2017-03-17 DIAGNOSIS — K5903 Drug induced constipation: Secondary | ICD-10-CM

## 2017-03-17 DIAGNOSIS — T07XXXA Unspecified multiple injuries, initial encounter: Secondary | ICD-10-CM

## 2017-03-17 DIAGNOSIS — F332 Major depressive disorder, recurrent severe without psychotic features: Secondary | ICD-10-CM

## 2017-03-17 DIAGNOSIS — Z9889 Other specified postprocedural states: Secondary | ICD-10-CM

## 2017-03-17 DIAGNOSIS — S82201C Unspecified fracture of shaft of right tibia, initial encounter for open fracture type IIIA, IIIB, or IIIC: Secondary | ICD-10-CM

## 2017-03-17 LAB — POCT INR: INR: 2.3 — AB (ref 0.9–1.1)

## 2017-03-17 LAB — PROTIME-INR: Protime: 25 — AB (ref 10.0–13.8)

## 2017-03-17 MED ORDER — OXYCODONE HCL 5 MG PO TABS
ORAL_TABLET | ORAL | 0 refills | Status: DC
Start: 2017-03-17 — End: 2017-04-27
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
## 2017-03-17 NOTE — Telephone Encounter (Signed)
RX faxed to [facility] @ [phone], phone number [phone] 

## 2017-03-18 LAB — HEPATIC FUNCTION PANEL
ALT: 19 (ref 7–35)
AST: 18 (ref 13–35)
Alkaline Phosphatase: 193 — AB (ref 25–125)
Bilirubin, Total: 0.3

## 2017-03-18 LAB — CBC AND DIFFERENTIAL
HCT: 33 — AB (ref 36–46)
HEMOGLOBIN: 10.4 — AB (ref 12.0–16.0)
Neutrophils Absolute: 6
PLATELETS: 566 — AB (ref 150–399)
WBC: 8.7

## 2017-03-18 LAB — BASIC METABOLIC PANEL
BUN: 18 (ref 4–21)
CREATININE: 0.4 — AB (ref 0.5–1.1)
Glucose: 110
POTASSIUM: 4.6 (ref 3.4–5.3)
Sodium: 138 (ref 137–147)

## 2017-03-20 ENCOUNTER — Non-Acute Institutional Stay (SKILLED_NURSING_FACILITY): Payer: Medicare Other | Admitting: Internal Medicine

## 2017-03-20 ENCOUNTER — Encounter: Payer: Self-pay | Admitting: Internal Medicine

## 2017-03-20 DIAGNOSIS — Z8781 Personal history of (healed) traumatic fracture: Secondary | ICD-10-CM

## 2017-03-20 DIAGNOSIS — Z967 Presence of other bone and tendon implants: Secondary | ICD-10-CM | POA: Diagnosis not present

## 2017-03-20 DIAGNOSIS — D649 Anemia, unspecified: Secondary | ICD-10-CM

## 2017-03-20 DIAGNOSIS — T07XXXA Unspecified multiple injuries, initial encounter: Secondary | ICD-10-CM

## 2017-03-20 DIAGNOSIS — Z9889 Other specified postprocedural states: Secondary | ICD-10-CM

## 2017-03-20 DIAGNOSIS — F332 Major depressive disorder, recurrent severe without psychotic features: Secondary | ICD-10-CM | POA: Diagnosis not present

## 2017-03-20 DIAGNOSIS — R339 Retention of urine, unspecified: Secondary | ICD-10-CM

## 2017-03-20 DIAGNOSIS — S060X0A Concussion without loss of consciousness, initial encounter: Secondary | ICD-10-CM | POA: Diagnosis not present
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
## 2017-03-26 DIAGNOSIS — Z9889 Other specified postprocedural states: Secondary | ICD-10-CM | POA: Insufficient documentation

## 2017-03-26 DIAGNOSIS — S060X0A Concussion without loss of consciousness, initial encounter: Secondary | ICD-10-CM | POA: Insufficient documentation

## 2017-03-26 DIAGNOSIS — Z8781 Personal history of (healed) traumatic fracture: Secondary | ICD-10-CM | POA: Insufficient documentation

## 2017-03-26 DIAGNOSIS — R339 Retention of urine, unspecified: Secondary | ICD-10-CM | POA: Insufficient documentation

## 2017-03-26 DIAGNOSIS — T07XXXA Unspecified multiple injuries, initial encounter: Secondary | ICD-10-CM | POA: Insufficient documentation

## 2017-03-26 DIAGNOSIS — D649 Anemia, unspecified: Secondary | ICD-10-CM | POA: Insufficient documentation

## 2017-03-29 LAB — POCT INR: INR: 2.7 — AB (ref 0.9–1.1)

## 2017-03-29 LAB — PROTIME-INR: PROTIME: 28.1 — AB (ref 10.0–13.8)

## 2017-04-04 LAB — PROTIME-INR: PROTIME: 27.6 — AB (ref 10.0–13.8)

## 2017-04-04 LAB — POCT INR: INR: 2.7 — AB (ref 0.9–1.1)

## 2017-04-08 DIAGNOSIS — Z7901 Long term (current) use of anticoagulants: Secondary | ICD-10-CM | POA: Insufficient documentation

## 2017-04-08 DIAGNOSIS — D62 Acute posthemorrhagic anemia: Secondary | ICD-10-CM | POA: Insufficient documentation

## 2017-04-08 DIAGNOSIS — K219 Gastro-esophageal reflux disease without esophagitis: Secondary | ICD-10-CM | POA: Insufficient documentation

## 2017-04-08 DIAGNOSIS — T402X5A Adverse effect of other opioids, initial encounter: Secondary | ICD-10-CM

## 2017-04-08 DIAGNOSIS — K5903 Drug induced constipation: Secondary | ICD-10-CM | POA: Insufficient documentation

## 2017-04-10 LAB — POCT INR: INR: 1.8 — AB (ref 0.9–1.1)

## 2017-04-10 LAB — PROTIME-INR: PROTIME: 20.8 — AB (ref 10.0–13.8)

## 2017-04-13 ENCOUNTER — Non-Acute Institutional Stay (SKILLED_NURSING_FACILITY): Payer: Medicare Other | Admitting: Internal Medicine

## 2017-04-13 ENCOUNTER — Encounter: Payer: Self-pay | Admitting: Internal Medicine

## 2017-04-13 DIAGNOSIS — S060X0D Concussion without loss of consciousness, subsequent encounter: Secondary | ICD-10-CM | POA: Diagnosis not present

## 2017-04-13 DIAGNOSIS — A4902 Methicillin resistant Staphylococcus aureus infection, unspecified site: Secondary | ICD-10-CM | POA: Diagnosis not present

## 2017-04-13 DIAGNOSIS — R339 Retention of urine, unspecified: Secondary | ICD-10-CM

## 2017-04-13 DIAGNOSIS — Z9889 Other specified postprocedural states: Secondary | ICD-10-CM

## 2017-04-13 DIAGNOSIS — Z8781 Personal history of (healed) traumatic fracture: Secondary | ICD-10-CM

## 2017-04-13 DIAGNOSIS — F332 Major depressive disorder, recurrent severe without psychotic features: Secondary | ICD-10-CM

## 2017-04-13 DIAGNOSIS — Z967 Presence of other bone and tendon implants: Secondary | ICD-10-CM | POA: Diagnosis not present

## 2017-04-13 DIAGNOSIS — D649 Anemia, unspecified: Secondary | ICD-10-CM

## 2017-04-13 DIAGNOSIS — T07XXXA Unspecified multiple injuries, initial encounter: Secondary | ICD-10-CM

## 2017-04-13 DIAGNOSIS — K5903 Drug induced constipation: Secondary | ICD-10-CM

## 2017-04-13 DIAGNOSIS — T402X5A Adverse effect of other opioids, initial encounter: Secondary | ICD-10-CM
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
## 2017-04-14 LAB — PROTIME-INR: PROTIME: 29.1 — AB (ref 10.0–13.8)

## 2017-04-14 LAB — POCT INR: INR: 2.9 — AB (ref 0.9–1.1)

## 2017-04-17 LAB — POCT INR: INR: 3.3 — AB (ref 0.9–1.1)

## 2017-04-17 LAB — PROTIME-INR: Protime: 32.7 — AB (ref 10.0–13.8)

## 2017-04-19 LAB — POCT INR: INR: 3.9 — AB (ref <=1.1)

## 2017-04-20 ENCOUNTER — Other Ambulatory Visit: Payer: Self-pay

## 2017-04-20 LAB — POCT INR: INR: 3.4 — AB (ref 0.9–1.1)

## 2017-04-20 LAB — PROTIME-INR: Protime: 33.6 — AB (ref 10.0–13.8)

## 2017-04-20 MED ORDER — ZOLPIDEM TARTRATE 5 MG PO TABS: 5.0000 mg | ORAL_TABLET | Freq: Every evening | ORAL | 0 refills | Status: DC | PRN

## 2017-04-20 NOTE — Telephone Encounter (Signed)
RX faxed to [facility] @ [phone], phone number [phone] 

## 2017-04-21 LAB — PROTIME-INR: Protime: 21 — AB (ref 10.0–13.8)

## 2017-04-21 LAB — POCT INR: INR: 1.9 — AB (ref 0.9–1.1)

## 2017-04-24 ENCOUNTER — Encounter (HOSPITAL_COMMUNITY): Payer: Self-pay | Admitting: *Deleted

## 2017-04-24 ENCOUNTER — Other Ambulatory Visit: Payer: Self-pay

## 2017-04-24 LAB — PROTIME-INR: Protime: 21.7 — AB (ref 10.0–13.8)

## 2017-04-24 LAB — POCT INR: INR: 1.9 — AB (ref 0.9–1.1)

## 2017-04-24 MED ORDER — TRAMADOL HCL ER 100 MG PO TB24: ORAL_TABLET | ORAL | 0 refills | Status: DC

## 2017-04-24 NOTE — Telephone Encounter (Signed)
RX faxed to [facility] @ [phone], phone number [phone] 

## 2017-04-24 NOTE — Progress Notes (Signed)
Spoke with [facility] about what medications to take and to be here at 530 for surgery tomorrow.  [facility] is trying to arrange transportation but may not be able to I asked them to call us back if they are unable to get transportation.   I also called Dr. Ardeth Perfect office to make them aware.  Instructed them to bring a current MAR  NPO at midnight and to 7 days prior to surgery STOP taking any Aspirin, Aleve, Naproxen, Ibuprofen, Motrin, Advil, Goody's, BC's, all herbal medications, fish oil, and all vitamins  Last dose of coumadin 04/23/17

## 2017-04-25 ENCOUNTER — Inpatient Hospital Stay (HOSPITAL_COMMUNITY): Payer: Medicare Other

## 2017-04-25 ENCOUNTER — Encounter (HOSPITAL_COMMUNITY): Payer: Self-pay

## 2017-04-25 ENCOUNTER — Encounter (HOSPITAL_COMMUNITY)
Admission: RE | Disposition: A | Discharge status: Skilled Nursing Facility | Payer: Self-pay | Source: Ambulatory Visit | Attending: Orthopedic Surgery

## 2017-04-25 ENCOUNTER — Inpatient Hospital Stay (HOSPITAL_COMMUNITY)
Admission: RE | Admit: 2017-04-25 | Discharge: 2017-04-27 | DRG: 464 | Disposition: A | Discharge status: Skilled Nursing Facility | Payer: Medicare Other | Source: Ambulatory Visit | Attending: Orthopedic Surgery | Admitting: Orthopedic Surgery

## 2017-04-25 ENCOUNTER — Inpatient Hospital Stay (HOSPITAL_COMMUNITY): Payer: Medicare Other | Admitting: Anesthesiology

## 2017-04-25 DIAGNOSIS — F17210 Nicotine dependence, cigarettes, uncomplicated: Secondary | ICD-10-CM | POA: Diagnosis present

## 2017-04-25 DIAGNOSIS — Z79891 Long term (current) use of opiate analgesic: Secondary | ICD-10-CM | POA: Diagnosis not present

## 2017-04-25 DIAGNOSIS — T84622A Infection and inflammatory reaction due to internal fixation device of right tibia, initial encounter: Secondary | ICD-10-CM | POA: Diagnosis present

## 2017-04-25 DIAGNOSIS — M868X6 Other osteomyelitis, lower leg: Secondary | ICD-10-CM | POA: Diagnosis present

## 2017-04-25 DIAGNOSIS — F332 Major depressive disorder, recurrent severe without psychotic features: Secondary | ICD-10-CM | POA: Diagnosis present

## 2017-04-25 DIAGNOSIS — M869 Osteomyelitis, unspecified: Secondary | ICD-10-CM | POA: Diagnosis present

## 2017-04-25 DIAGNOSIS — F32A Depression, unspecified: Secondary | ICD-10-CM | POA: Diagnosis present

## 2017-04-25 DIAGNOSIS — F329 Major depressive disorder, single episode, unspecified: Secondary | ICD-10-CM | POA: Diagnosis present

## 2017-04-25 DIAGNOSIS — T847XXA Infection and inflammatory reaction due to other internal orthopedic prosthetic devices, implants and grafts, initial encounter: Secondary | ICD-10-CM

## 2017-04-25 DIAGNOSIS — Z792 Long term (current) use of antibiotics: Secondary | ICD-10-CM | POA: Diagnosis not present

## 2017-04-25 DIAGNOSIS — Z79899 Other long term (current) drug therapy: Secondary | ICD-10-CM

## 2017-04-25 DIAGNOSIS — Z419 Encounter for procedure for purposes other than remedying health state, unspecified: Secondary | ICD-10-CM

## 2017-04-25 DIAGNOSIS — S81802A Unspecified open wound, left lower leg, initial encounter: Secondary | ICD-10-CM | POA: Diagnosis present

## 2017-04-25 DIAGNOSIS — K219 Gastro-esophageal reflux disease without esophagitis: Secondary | ICD-10-CM | POA: Diagnosis present

## 2017-04-25 DIAGNOSIS — D62 Acute posthemorrhagic anemia: Secondary | ICD-10-CM | POA: Diagnosis not present

## 2017-04-25 DIAGNOSIS — Z7901 Long term (current) use of anticoagulants: Secondary | ICD-10-CM | POA: Diagnosis not present

## 2017-04-25 HISTORY — PX: BONE EXCISION: SHX6730

## 2017-04-25 HISTORY — PX: HARDWARE REMOVAL: SHX979

## 2017-04-25 LAB — CBC WITH DIFFERENTIAL/PLATELET
Basophils Absolute: 0 10*3/uL (ref 0.0–0.1)
Basophils Relative: 1 %
Eosinophils Absolute: 0.3 10*3/uL (ref 0.0–0.7)
Eosinophils Relative: 5 %
HEMATOCRIT: 37.9 % (ref 36.0–46.0)
HEMOGLOBIN: 11.8 g/dL — AB (ref 12.0–15.0)
LYMPHS ABS: 1.2 10*3/uL (ref 0.7–4.0)
LYMPHS PCT: 23 %
MCH: 29.2 pg (ref 26.0–34.0)
MCHC: 31.1 g/dL (ref 30.0–36.0)
MCV: 93.8 fL (ref 78.0–100.0)
MONO ABS: 0.4 10*3/uL (ref 0.1–1.0)
MONOS PCT: 7 %
NEUTROS ABS: 3.5 10*3/uL (ref 1.7–7.7)
Neutrophils Relative %: 64 %
Platelets: 212 10*3/uL (ref 150–400)
RBC: 4.04 MIL/uL (ref 3.87–5.11)
RDW: 14.4 % (ref 11.5–15.5)
WBC: 5.4 10*3/uL (ref 4.0–10.5)

## 2017-04-25 LAB — PROTIME-INR
INR: 2.33
PROTHROMBIN TIME: 25.4 s — AB (ref 11.4–15.2)

## 2017-04-25 LAB — CREATININE, SERUM
CREATININE: 0.55 mg/dL (ref 0.44–1.00)
GFR calc non Af Amer: >60 mL/min (ref >60)

## 2017-04-25 LAB — CBC
HCT: 34.7 % — ABNORMAL LOW (ref 36.0–46.0)
Hemoglobin: 11 g/dL — ABNORMAL LOW (ref 12.0–15.0)
MCH: 29.3 pg (ref 26.0–34.0)
MCHC: 31.7 g/dL (ref 30.0–36.0)
MCV: 92.3 fL (ref 78.0–100.0)
PLATELETS: 204 10*3/uL (ref 150–400)
RBC: 3.76 MIL/uL — AB (ref 3.87–5.11)
RDW: 14 % (ref 11.5–15.5)
WBC: 6.4 10*3/uL (ref 4.0–10.5)

## 2017-04-25 LAB — COMPREHENSIVE METABOLIC PANEL
ALT: 15 U/L (ref 14–54)
ANION GAP: 8 (ref 5–15)
AST: 20 U/L (ref 15–41)
Albumin: 3.8 g/dL (ref 3.5–5.0)
Alkaline Phosphatase: 115 U/L (ref 38–126)
BILIRUBIN TOTAL: 0.2 mg/dL — AB (ref 0.3–1.2)
BUN: 16 mg/dL (ref 6–20)
CO2: 27 mmol/L (ref 22–32)
Calcium: 9.2 mg/dL (ref 8.9–10.3)
Chloride: 105 mmol/L (ref 101–111)
Creatinine, Ser: 0.67 mg/dL (ref 0.44–1.00)
GFR calc Af Amer: >60 mL/min (ref >60)
Glucose, Bld: 102 mg/dL — ABNORMAL HIGH (ref 65–99)
POTASSIUM: 4.1 mmol/L (ref 3.5–5.1)
Sodium: 140 mmol/L (ref 135–145)
TOTAL PROTEIN: 5.8 g/dL — AB (ref 6.5–8.1)

## 2017-04-25 LAB — APTT: APTT: 39 s — AB (ref 24–36)

## 2017-04-25 SURGERY — REMOVAL, HARDWARE
Anesthesia: General | Laterality: Right

## 2017-04-25 MED ORDER — VANCOMYCIN HCL IN DEXTROSE 1-5 GM/200ML-% IV SOLN
1000.0000 mg | INTRAVENOUS | Status: AC
  Administered 2017-04-25: 1000 mg via INTRAVENOUS
  Filled 2017-04-25: qty 200

## 2017-04-25 MED ORDER — OXYCODONE HCL 5 MG PO TABS
5.0000 mg | ORAL_TABLET | Freq: Once | ORAL | Status: AC | PRN
  Administered 2017-04-25: 5 mg via ORAL

## 2017-04-25 MED ORDER — PHENYLEPHRINE HCL 10 MG/ML IJ SOLN
INTRAVENOUS | Status: DC | PRN
  Administered 2017-04-25: 20 ug/min via INTRAVENOUS

## 2017-04-25 MED ORDER — ONDANSETRON HCL 4 MG/2ML IJ SOLN
INTRAMUSCULAR | Status: DC | PRN
  Administered 2017-04-25: 4 mg via INTRAVENOUS

## 2017-04-25 MED ORDER — LIDOCAINE 2% (20 MG/ML) 5 ML SYRINGE
INTRAMUSCULAR | Status: DC | PRN
  Administered 2017-04-25: 40 mg via INTRAVENOUS

## 2017-04-25 MED ORDER — CEFAZOLIN SODIUM-DEXTROSE 1-4 GM/50ML-% IV SOLN
1.0000 g | Freq: Four times a day (QID) | INTRAVENOUS | Status: AC
  Administered 2017-04-25 – 2017-04-26 (×3): 1 g via INTRAVENOUS
  Filled 2017-04-25 (×3): qty 50

## 2017-04-25 MED ORDER — HYDROMORPHONE HCL 1 MG/ML IJ SOLN
1.0000 mg | INTRAMUSCULAR | Status: DC | PRN
  Administered 2017-04-25 (×3): 1 mg via INTRAVENOUS
  Administered 2017-04-26 (×3): 2 mg via INTRAVENOUS
  Administered 2017-04-27: 1 mg via INTRAVENOUS
  Filled 2017-04-25 (×4): qty 1
  Filled 2017-04-25: qty 2
  Filled 2017-04-25 (×2): qty 1
  Filled 2017-04-25: qty 2

## 2017-04-25 MED ORDER — ACETAMINOPHEN 650 MG RE SUPP: 650.0000 mg | Freq: Four times a day (QID) | RECTAL | Status: DC | PRN

## 2017-04-25 MED ORDER — ONDANSETRON HCL 4 MG/2ML IJ SOLN
INTRAMUSCULAR | Status: AC
  Filled 2017-04-25: qty 2

## 2017-04-25 MED ORDER — ROCURONIUM BROMIDE 10 MG/ML (PF) SYRINGE
PREFILLED_SYRINGE | INTRAVENOUS | Status: AC
  Filled 2017-04-25: qty 5

## 2017-04-25 MED ORDER — MIDAZOLAM HCL 2 MG/2ML IJ SOLN
INTRAMUSCULAR | Status: AC
  Filled 2017-04-25: qty 2

## 2017-04-25 MED ORDER — VANCOMYCIN HCL 1000 MG IV SOLR
INTRAVENOUS | Status: DC | PRN
  Administered 2017-04-25: 1000 mg

## 2017-04-25 MED ORDER — SUGAMMADEX SODIUM 200 MG/2ML IV SOLN
INTRAVENOUS | Status: DC | PRN
  Administered 2017-04-25: 200 mg via INTRAVENOUS

## 2017-04-25 MED ORDER — METOCLOPRAMIDE HCL 5 MG/ML IJ SOLN: 5.0000 mg | Freq: Three times a day (TID) | INTRAMUSCULAR | Status: DC | PRN

## 2017-04-25 MED ORDER — 0.9 % SODIUM CHLORIDE (POUR BTL) OPTIME
TOPICAL | Status: DC | PRN
  Administered 2017-04-25: 1000 mL

## 2017-04-25 MED ORDER — METOCLOPRAMIDE HCL 5 MG PO TABS: 5.0000 mg | ORAL_TABLET | Freq: Three times a day (TID) | ORAL | Status: DC | PRN

## 2017-04-25 MED ORDER — ACETAMINOPHEN 325 MG PO TABS: 650.0000 mg | ORAL_TABLET | Freq: Four times a day (QID) | ORAL | Status: DC | PRN

## 2017-04-25 MED ORDER — ENOXAPARIN SODIUM 40 MG/0.4ML ~~LOC~~ SOLN
40.0000 mg | SUBCUTANEOUS | Status: DC
  Administered 2017-04-26: 40 mg via SUBCUTANEOUS
  Filled 2017-04-25 (×2): qty 0.4

## 2017-04-25 MED ORDER — DEXAMETHASONE SODIUM PHOSPHATE 10 MG/ML IJ SOLN
INTRAMUSCULAR | Status: DC | PRN
  Administered 2017-04-25: 10 mg via INTRAVENOUS

## 2017-04-25 MED ORDER — POTASSIUM CHLORIDE IN NACL 20-0.9 MEQ/L-% IV SOLN
INTRAVENOUS | Status: DC
  Administered 2017-04-26: 20 mL/h via INTRAVENOUS
  Administered 2017-04-27: via INTRAVENOUS
  Filled 2017-04-25 (×2): qty 1000

## 2017-04-25 MED ORDER — ROCURONIUM BROMIDE 10 MG/ML (PF) SYRINGE
PREFILLED_SYRINGE | INTRAVENOUS | Status: DC | PRN
  Administered 2017-04-25: 50 mg via INTRAVENOUS

## 2017-04-25 MED ORDER — FENTANYL CITRATE (PF) 250 MCG/5ML IJ SOLN
INTRAMUSCULAR | Status: DC | PRN
Start: 2017-04-25 — End: 2017-04-25
  Administered 2017-04-25 (×4): 50 ug via INTRAVENOUS
  Administered 2017-04-25: 100 ug via INTRAVENOUS

## 2017-04-25 MED ORDER — ONDANSETRON HCL 4 MG/2ML IJ SOLN: 4.0000 mg | Freq: Once | INTRAMUSCULAR | Status: DC | PRN

## 2017-04-25 MED ORDER — GLYCOPYRROLATE 0.2 MG/ML IV SOSY
PREFILLED_SYRINGE | INTRAVENOUS | Status: DC | PRN
  Administered 2017-04-25: .1 mg via INTRAVENOUS

## 2017-04-25 MED ORDER — METHOCARBAMOL 500 MG PO TABS
ORAL_TABLET | ORAL | Status: AC
  Filled 2017-04-25: qty 1

## 2017-04-25 MED ORDER — FENTANYL CITRATE (PF) 100 MCG/2ML IJ SOLN
INTRAMUSCULAR | Status: AC
  Filled 2017-04-25: qty 2

## 2017-04-25 MED ORDER — DOCUSATE SODIUM 100 MG PO CAPS
100.0000 mg | ORAL_CAPSULE | Freq: Two times a day (BID) | ORAL | Status: DC
  Administered 2017-04-25 – 2017-04-26 (×3): 100 mg via ORAL
  Filled 2017-04-25 (×3): qty 1

## 2017-04-25 MED ORDER — HYDROCODONE-ACETAMINOPHEN 5-325 MG PO TABS
ORAL_TABLET | ORAL | Status: AC
  Filled 2017-04-25: qty 2

## 2017-04-25 MED ORDER — PHENYLEPHRINE 40 MCG/ML (10ML) SYRINGE FOR IV PUSH (FOR BLOOD PRESSURE SUPPORT)
PREFILLED_SYRINGE | INTRAVENOUS | Status: AC
  Filled 2017-04-25: qty 10

## 2017-04-25 MED ORDER — LIDOCAINE 2% (20 MG/ML) 5 ML SYRINGE
INTRAMUSCULAR | Status: AC
  Filled 2017-04-25: qty 10

## 2017-04-25 MED ORDER — FENTANYL CITRATE (PF) 100 MCG/2ML IJ SOLN
INTRAMUSCULAR | Status: AC
  Administered 2017-04-25: 50 ug via INTRAVENOUS
  Filled 2017-04-25: qty 2

## 2017-04-25 MED ORDER — CHLORHEXIDINE GLUCONATE 4 % EX LIQD: 60.0000 mL | Freq: Once | CUTANEOUS | Status: DC

## 2017-04-25 MED ORDER — VANCOMYCIN HCL 1000 MG IV SOLR
INTRAVENOUS | Status: AC
  Filled 2017-04-25: qty 1000

## 2017-04-25 MED ORDER — FENTANYL CITRATE (PF) 250 MCG/5ML IJ SOLN
INTRAMUSCULAR | Status: AC
  Filled 2017-04-25: qty 5

## 2017-04-25 MED ORDER — VANCOMYCIN HCL IN DEXTROSE 750-5 MG/150ML-% IV SOLN
750.0000 mg | Freq: Two times a day (BID) | INTRAVENOUS | Status: DC
  Administered 2017-04-25: 750 mg via INTRAVENOUS
  Filled 2017-04-25 (×3): qty 150

## 2017-04-25 MED ORDER — PROPOFOL 10 MG/ML IV BOLUS
INTRAVENOUS | Status: AC
  Filled 2017-04-25: qty 20

## 2017-04-25 MED ORDER — MIDAZOLAM HCL 2 MG/2ML IJ SOLN
INTRAMUSCULAR | Status: DC | PRN
  Administered 2017-04-25: 2 mg via INTRAVENOUS

## 2017-04-25 MED ORDER — PROPOFOL 10 MG/ML IV BOLUS
INTRAVENOUS | Status: DC | PRN
  Administered 2017-04-25: 150 mg via INTRAVENOUS

## 2017-04-25 MED ORDER — OXYCODONE HCL 5 MG PO TABS
ORAL_TABLET | ORAL | Status: AC
  Filled 2017-04-25: qty 1

## 2017-04-25 MED ORDER — PHENYLEPHRINE 40 MCG/ML (10ML) SYRINGE FOR IV PUSH (FOR BLOOD PRESSURE SUPPORT)
PREFILLED_SYRINGE | INTRAVENOUS | Status: DC | PRN
  Administered 2017-04-25: 120 ug via INTRAVENOUS

## 2017-04-25 MED ORDER — ALBUMIN HUMAN 5 % IV SOLN
INTRAVENOUS | Status: DC | PRN
  Administered 2017-04-25: 09:00:00 via INTRAVENOUS

## 2017-04-25 MED ORDER — SUGAMMADEX SODIUM 200 MG/2ML IV SOLN
INTRAVENOUS | Status: AC
  Filled 2017-04-25: qty 2

## 2017-04-25 MED ORDER — OXYCODONE HCL 5 MG/5ML PO SOLN: 5.0000 mg | Freq: Once | ORAL | Status: AC | PRN

## 2017-04-25 MED ORDER — ONDANSETRON HCL 4 MG PO TABS: 4.0000 mg | ORAL_TABLET | Freq: Four times a day (QID) | ORAL | Status: DC | PRN

## 2017-04-25 MED ORDER — METHOCARBAMOL 1000 MG/10ML IJ SOLN
1000.0000 mg | Freq: Four times a day (QID) | INTRAMUSCULAR | Status: DC | PRN
  Filled 2017-04-25: qty 10

## 2017-04-25 MED ORDER — ONDANSETRON HCL 4 MG/2ML IJ SOLN: 4.0000 mg | Freq: Four times a day (QID) | INTRAMUSCULAR | Status: DC | PRN

## 2017-04-25 MED ORDER — VITAMIN K1 10 MG/ML IJ SOLN
10.0000 mg | INTRAVENOUS | Status: AC
  Administered 2017-04-25: 10 mg via INTRAVENOUS
  Filled 2017-04-25: qty 1

## 2017-04-25 MED ORDER — FENTANYL CITRATE (PF) 100 MCG/2ML IJ SOLN
25.0000 ug | INTRAMUSCULAR | Status: DC | PRN
  Administered 2017-04-25 (×3): 50 ug via INTRAVENOUS

## 2017-04-25 MED ORDER — METHOCARBAMOL 500 MG PO TABS
500.0000 mg | ORAL_TABLET | Freq: Four times a day (QID) | ORAL | Status: DC | PRN
  Administered 2017-04-25: 500 mg via ORAL
  Administered 2017-04-26 (×2): 1000 mg via ORAL
  Administered 2017-04-27 (×2): 500 mg via ORAL
  Filled 2017-04-25: qty 1
  Filled 2017-04-25 (×3): qty 2

## 2017-04-25 MED ORDER — DEXAMETHASONE SODIUM PHOSPHATE 10 MG/ML IJ SOLN
INTRAMUSCULAR | Status: AC
  Filled 2017-04-25: qty 1

## 2017-04-25 MED ORDER — HYDROCODONE-ACETAMINOPHEN 5-325 MG PO TABS
1.0000 | ORAL_TABLET | ORAL | Status: DC | PRN
  Administered 2017-04-25 – 2017-04-26 (×3): 2 via ORAL
  Administered 2017-04-26: 1 via ORAL
  Administered 2017-04-26 – 2017-04-27 (×3): 2 via ORAL
  Filled 2017-04-25 (×6): qty 2

## 2017-04-25 MED ORDER — LACTATED RINGERS IV SOLN
INTRAVENOUS | Status: DC
  Administered 2017-04-25: 08:00:00 via INTRAVENOUS

## 2017-04-25 SURGICAL SUPPLY — 61 items
BANDAGE ACE 4X5 VEL STRL LF (GAUZE/BANDAGES/DRESSINGS) ×1 IMPLANT
BANDAGE ACE 6X5 VEL STRL LF (GAUZE/BANDAGES/DRESSINGS) ×1 IMPLANT
BANDAGE ESMARK 6X9 LF (GAUZE/BANDAGES/DRESSINGS) ×1 IMPLANT
BNDG CMPR 9X6 STRL LF SNTH (GAUZE/BANDAGES/DRESSINGS) ×1
BNDG COHESIVE 6X5 TAN STRL LF (GAUZE/BANDAGES/DRESSINGS) ×2 IMPLANT
BNDG ESMARK 6X9 LF (GAUZE/BANDAGES/DRESSINGS) ×2
BNDG GAUZE ELAST 4 BULKY (GAUZE/BANDAGES/DRESSINGS) ×4 IMPLANT
BRUSH SCRUB SURG 4.25 DISP (MISCELLANEOUS) ×4 IMPLANT
COVER SURGICAL LIGHT HANDLE (MISCELLANEOUS) ×4 IMPLANT
CUFF TOURNIQUET SINGLE 18IN (TOURNIQUET CUFF) IMPLANT
CUFF TOURNIQUET SINGLE 24IN (TOURNIQUET CUFF) IMPLANT
CUFF TOURNIQUET SINGLE 34IN LL (TOURNIQUET CUFF) IMPLANT
DRAPE C-ARM 42X72 X-RAY (DRAPES) IMPLANT
DRAPE C-ARMOR (DRAPES) ×2 IMPLANT
DRAPE U-SHAPE 47X51 STRL (DRAPES) ×2 IMPLANT
DRESSING PREVENA PLUS CUSTOM (GAUZE/BANDAGES/DRESSINGS) IMPLANT
DRSG ADAPTIC 3X8 NADH LF (GAUZE/BANDAGES/DRESSINGS) ×1 IMPLANT
DRSG PREVENA PLUS CUSTOM (GAUZE/BANDAGES/DRESSINGS) ×2
ELECT REM PT RETURN 9FT ADLT (ELECTROSURGICAL) ×2
ELECTRODE REM PT RTRN 9FT ADLT (ELECTROSURGICAL) ×1 IMPLANT
GAUZE SPONGE 4X4 12PLY STRL (GAUZE/BANDAGES/DRESSINGS) ×2 IMPLANT
GLOVE BIO SURGEON STRL SZ7.5 (GLOVE) ×2 IMPLANT
GLOVE BIO SURGEON STRL SZ8 (GLOVE) ×2 IMPLANT
GLOVE BIOGEL PI IND STRL 7.5 (GLOVE) ×1 IMPLANT
GLOVE BIOGEL PI IND STRL 8 (GLOVE) ×1 IMPLANT
GLOVE BIOGEL PI INDICATOR 7.5 (GLOVE) ×1
GLOVE BIOGEL PI INDICATOR 8 (GLOVE) ×1
GOWN STRL REUS W/ TWL LRG LVL3 (GOWN DISPOSABLE) ×2 IMPLANT
GOWN STRL REUS W/ TWL XL LVL3 (GOWN DISPOSABLE) ×1 IMPLANT
GOWN STRL REUS W/TWL LRG LVL3 (GOWN DISPOSABLE) ×4
GOWN STRL REUS W/TWL XL LVL3 (GOWN DISPOSABLE) ×2
KIT BASIN OR (CUSTOM PROCEDURE TRAY) ×2 IMPLANT
KIT ROOM TURNOVER OR (KITS) ×2 IMPLANT
KIT STIMULAN RAPID CURE  10CC (Orthopedic Implant) ×1 IMPLANT
KIT STIMULAN RAPID CURE 10CC (Orthopedic Implant) IMPLANT
MANIFOLD NEPTUNE II (INSTRUMENTS) ×2 IMPLANT
NEEDLE 22X1 1/2 (OR ONLY) (NEEDLE) IMPLANT
NS IRRIG 1000ML POUR BTL (IV SOLUTION) ×2 IMPLANT
PACK ORTHO EXTREMITY (CUSTOM PROCEDURE TRAY) ×2 IMPLANT
PAD ARMBOARD 7.5X6 YLW CONV (MISCELLANEOUS) ×4 IMPLANT
PADDING CAST COTTON 6X4 STRL (CAST SUPPLIES) ×3 IMPLANT
SPONGE LAP 18X18 X RAY DECT (DISPOSABLE) ×2 IMPLANT
STAPLER VISISTAT 35W (STAPLE) IMPLANT
STOCKINETTE IMPERVIOUS LG (DRAPES) ×2 IMPLANT
STRIP CLOSURE SKIN 1/2X4 (GAUZE/BANDAGES/DRESSINGS) IMPLANT
SUCTION FRAZIER HANDLE 10FR (MISCELLANEOUS)
SUCTION TUBE FRAZIER 10FR DISP (MISCELLANEOUS) IMPLANT
SUT ETHILON 3 0 PS 1 (SUTURE) IMPLANT
SUT PDS AB 2-0 CT1 27 (SUTURE) IMPLANT
SUT VIC AB 0 CT1 27 (SUTURE)
SUT VIC AB 0 CT1 27XBRD ANBCTR (SUTURE) IMPLANT
SUT VIC AB 2-0 CT1 27 (SUTURE)
SUT VIC AB 2-0 CT1 TAPERPNT 27 (SUTURE) IMPLANT
SYR CONTROL 10ML LL (SYRINGE) IMPLANT
TOWEL OR 17X24 6PK STRL BLUE (TOWEL DISPOSABLE) ×4 IMPLANT
TOWEL OR 17X26 10 PK STRL BLUE (TOWEL DISPOSABLE) ×4 IMPLANT
TUBE CONNECTING 12X1/4 (SUCTIONS) ×2 IMPLANT
UNDERPAD 30X30 (UNDERPADS AND DIAPERS) ×2 IMPLANT
WATER STERILE IRR 1000ML POUR (IV SOLUTION) ×4 IMPLANT
WND VAC CANISTER 500ML (MISCELLANEOUS) ×1 IMPLANT
YANKAUER SUCT BULB TIP NO VENT (SUCTIONS) ×2 IMPLANT

## 2017-04-25 NOTE — Anesthesia Preprocedure Evaluation (Addendum)
Anesthesia Evaluation  Patient identified by MRN, date of birth, ID band Patient awake    Reviewed: Allergy & Precautions, NPO status , Patient's Chart, lab work & pertinent test results  Airway Mallampati: II  TM Distance: >3 FB Neck ROM: Full    Dental  (+) Teeth Intact, Dental Advisory Given   Pulmonary Current Smoker,    breath sounds clear to auscultation       Cardiovascular  Rhythm:Regular Rate:Normal     Neuro/Psych    GI/Hepatic   Endo/Other    Renal/GU      Musculoskeletal   Abdominal   Peds  Hematology   Anesthesia Other Findings   Reproductive/Obstetrics                            Anesthesia Physical Anesthesia Plan  ASA: III  Anesthesia Plan: General   Post-op Pain Management:    Induction: Intravenous  PONV Risk Score and Plan: 1 and Ondansetron and Dexamethasone  Airway Management Planned: Oral ETT  Additional Equipment:   Intra-op Plan:   Post-operative Plan: Extubation in OR  Informed Consent: I have reviewed the patients History and Physical, chart, labs and discussed the procedure including the risks, benefits and alternatives for the proposed anesthesia with the patient or authorized representative who has indicated his/her understanding and acceptance.   Dental advisory given  Plan Discussed with: CRNA and Anesthesiologist  Anesthesia Plan Comments:         Anesthesia Quick Evaluation

## 2017-04-25 NOTE — Op Note (Deleted)
  The note originally documented on this encounter has been moved the the encounter in which it belongs.  

## 2017-04-25 NOTE — H&P (Signed)
[facility] H&P/Consult     Patient ID: Deborah [name] MRN: [id number] DOB/AGE: 04-08-1956 62 y.o.  Chief Complaint: infected right tibial plate s/p bilateral open fractures HPI: Deborah [name] is an 61 y.o. female. Who sustained bilateral open fractures with cement spacer on the left, subsequent infection on the right. Was planning for Plastic surgery with rotational flap with Dr. Rene Paci at [facility] but had to be delayed until next week. As a result, decision made to urgently remove plate in the interim on the right using a staged procedure. Coumadin held. Suppression with doxycycline for MRSA.  Past Medical History:  Diagnosis Date  . Depression   . Low back pain   . Neck pain   . [facility]     Past Surgical History:  Procedure Laterality Date  . EXTERNAL FIXATION LEG Bilateral 02/22/2017   Procedure: EXTERNAL FIXATION LEFT LOWER LEG;  Surgeon: Newt Minion, MD;  Location: [place];  Service: Orthopedics;  Laterality: Bilateral;  . EXTERNAL FIXATION REMOVAL Bilateral 02/28/2017   Procedure: REMOVAL EXTERNAL FIXATION LEG;  Surgeon: Altamese McCarr, MD;  Location: [place];  Service: Orthopedics;  Laterality: Bilateral;  . FACIAL LACERATION REPAIR N/A 02/22/2017   Procedure: FACIAL LACERATION REPAIR;  Surgeon: Newt Minion, MD;  Location: [place];  Service: Orthopedics;  Laterality: N/A;  . I&D EXTREMITY Bilateral 02/22/2017   Procedure: IRRIGATION AND DEBRIDEMENT BILATERL LOWER EXTREMITIES;  Surgeon: Newt Minion, MD;  Location: [place];  Service: Orthopedics;  Laterality: Bilateral;  . I&D EXTREMITY Bilateral 02/24/2017   Procedure: BILATERAL TIBIAS DEBRIDEMENT AND PLACEMENT OF ANTIBIOTIC BEADS LEFT TIBIAS;  Surgeon: Altamese [place], MD;  Location: [place];  Service: Orthopedics;  Laterality: Bilateral;  . ORIF TIBIA FRACTURE Bilateral 02/28/2017   Procedure: OPEN REDUCTION INTERNAL FIXATION (ORIF) TIBIA FRACTURE;  Surgeon: Altamese [place],  MD;  Location: [place];  Service: Orthopedics;  Laterality: Bilateral;  . ORIF TIBIA PLATEAU Left 02/24/2017   Procedure: Open Reduction Internal Fixation Tibial Plateau;  Surgeon: Altamese [place], MD;  Location: [place];  Service: Orthopedics;  Laterality: Left;    Family History  Problem Relation Age of Onset  . Leukemia Father   . Alcohol abuse Sister    Social History:  reports that she has been smoking Cigarettes.  She has been smoking about 0.50 packs per day. She has never used smokeless tobacco. She reports that she does not drink alcohol. Her drug history is not on file.  Allergies: No Known Allergies  Medications Prior to Admission  Medication Sig Dispense Refill  . acetaminophen (TYLENOL) 325 MG tablet Take 2 tablets (650 mg total) by mouth every 6 (six) hours.    . bethanechol (URECHOLINE) 5 MG tablet Take 1 tablet (5 mg total) by mouth 3 (three) times daily.    . Brexpiprazole (REXULTI) 2 MG TABS Take 2 mg by mouth at bedtime.    Marland Kitchen buPROPion (WELLBUTRIN XL) 300 MG 24 hr tablet Take 1 tablet (300 mg total) by mouth daily.    . chlorhexidine (PERIDEX) 0.12 % solution 15 mLs by Mouth Rinse route 2 (two) times daily. 120 mL 0  . docusate sodium (COLACE) 100 MG capsule Take 1 capsule (100 mg total) by mouth 2 (two) times daily. 10 capsule 0  . doxycycline (VIBRAMYCIN) 100 MG capsule Take 100 mg by mouth 2 (two) times daily.    . ferrous gluconate (FERGON) 324 MG tablet Take 1 tablet (324 mg total) by mouth 2 (two) times  daily with a meal.  3  . LACTOBACILLUS PO Take 1 capsule by mouth 2 (two) times daily.    . methocarbamol (ROBAXIN) 500 MG tablet Take 1 tablet (500 mg total) by mouth every 8 (eight) hours. 20 tablet 0  . Multiple Vitamin (MULTIVITAMIN WITH MINERALS) TABS tablet Take 1 tablet by mouth daily.    Marland Kitchen oxyCODONE (OXY IR/ROXICODONE) 5 MG immediate release tablet Take 1 tablet by mouth every 6 hours routinely and every 3 hours as needed 200 tablet 0  . pantoprazole (PROTONIX)  40 MG tablet Take 1 tablet (40 mg total) by mouth daily.    . polyethylene glycol (MIRALAX / GLYCOLAX) packet Take 17 g by mouth daily. 14 each 0  . tamsulosin (FLOMAX) 0.4 MG CAPS capsule Take 1 capsule (0.4 mg total) by mouth daily. 30 capsule   . traMADol (ULTRAM-ER) 100 MG 24 hr tablet Give 1 tablet by mouth every 8 hours routinely for pain 90 tablet 0  . Vilazodone HCl (VIIBRYD) 40 MG TABS Take 40 mg by mouth every morning.    . warfarin (COUMADIN) 3 MG tablet Take 1.5 tablets (4.5 mg total) by mouth one time only at 6 PM. Adjust daily dose as needed based on INR.    Marland Kitchen zolpidem (AMBIEN) 5 MG tablet Take 1 tablet (5 mg total) by mouth at bedtime as needed for sleep. 30 tablet 0    Results for orders placed or performed during the hospital encounter of 04/25/17 (from the past 48 hour(s))  CBC WITH DIFFERENTIAL     Status: Abnormal   Collection Time: 04/25/17  7:01 AM  Result Value Ref Range   WBC 5.4 4.0 - 10.5 K/uL   RBC 4.04 3.87 - 5.11 MIL/uL   Hemoglobin 11.8 (L) 12.0 - 15.0 g/dL   HCT 37.9 36.0 - 46.0 %   MCV 93.8 78.0 - 100.0 fL   MCH 29.2 26.0 - 34.0 pg   MCHC 31.1 30.0 - 36.0 g/dL   RDW 14.4 11.5 - 15.5 %   Platelets 212 150 - 400 K/uL   Neutrophils Relative % 64 %   Neutro Abs 3.5 1.7 - 7.7 K/uL   Lymphocytes Relative 23 %   Lymphs Abs 1.2 0.7 - 4.0 K/uL   Monocytes Relative 7 %   Monocytes Absolute 0.4 0.1 - 1.0 K/uL   Eosinophils Relative 5 %   Eosinophils Absolute 0.3 0.0 - 0.7 K/uL   Basophils Relative 1 %   Basophils Absolute 0.0 0.0 - 0.1 K/uL  Comprehensive metabolic panel     Status: Abnormal   Collection Time: 04/25/17  7:01 AM  Result Value Ref Range   Sodium 140 135 - 145 mmol/L   Potassium 4.1 3.5 - 5.1 mmol/L   Chloride 105 101 - 111 mmol/L   CO2 27 22 - 32 mmol/L   Glucose, Bld 102 (H) 65 - 99 mg/dL   BUN 16 6 - 20 mg/dL   Creatinine, Ser 0.67 0.44 - 1.00 mg/dL   Calcium 9.2 8.9 - 10.3 mg/dL   Total Protein 5.8 (L) 6.5 - 8.1 g/dL   Albumin 3.8 3.5  - 5.0 g/dL   AST 20 15 - 41 U/L   ALT 15 14 - 54 U/L   Alkaline Phosphatase 115 38 - 126 U/L   Total Bilirubin 0.2 (L) 0.3 - 1.2 mg/dL   GFR calc non Af Amer >60 >60 mL/min   GFR calc Af Amer >60 >60 mL/min    Comment: (NOTE) The  eGFR has been calculated using the CKD EPI equation. This calculation has not been validated in all clinical situations. eGFR's persistently <60 mL/min signify possible Chronic Kidney Disease.    Anion gap 8 5 - 15  Protime-INR     Status: Abnormal   Collection Time: 04/25/17  7:01 AM  Result Value Ref Range   Prothrombin Time 25.4 (H) 11.4 - 15.2 seconds   INR 2.33   APTT     Status: Abnormal   Collection Time: 04/25/17  7:01 AM  Result Value Ref Range   aPTT 39 (H) 24 - 36 seconds    Comment:        IF BASELINE aPTT IS ELEVATED, SUGGEST PATIENT RISK ASSESSMENT BE USED TO DETERMINE APPROPRIATE ANTICOAGULANT THERAPY.    No results found.  ROS No recent fever, bleeding abnormalities, urologic dysfunction, GI problems, or weight gain.  Blood pressure 117/64, pulse (!) 56, temperature 97.6 F (36.4 C), temperature source Oral, resp. rate 18, height 5' 5"  (1.651 m), weight 53.5 kg (118 lb), SpO2 100 %. Physical Exam Very pleasant RRR No wheezing LLE Dressing intact, persistent drainage and hypertrophy  Edema/ swelling controlled  Sens: DPN, SPN, TN intact  Motor: EHL, FHL, and lessor toe ext and flex all intact grossly  Brisk cap refill, warm to touch    Assessment/Plan Infected hardware requiring staged removal prior to rotational flap with plastic surgery on the left Elevated INR from coumadin  I discussed with the patient the risks and benefits of surgery, including the possibility of failure to resolve infection, nerve injury, vessel injury, wound breakdown, arthritis, occult nonunion, DVT/ PE, loss of motion, malunion, and need for further surgery among others.  She acknowledged these risks and wished to proceed.   Altamese [place],  MD [facility] [phone] (p)  04/25/2017, 7:54 AM

## 2017-04-25 NOTE — Anesthesia Postprocedure Evaluation (Signed)
Anesthesia Post Note  Patient: Deborah [name]  Procedure(s) Performed: Procedure(s) (LRB): HARDWARE REMOVAL RIGHT KNEE (Right) PARTIAL EXCISION RIGHT TIBIA (Right)     Patient location during evaluation: PACU Anesthesia Type: General Level of consciousness: awake, awake and alert and oriented Pain management: pain level controlled Vital Signs Assessment: post-procedure vital signs reviewed and stable Respiratory status: spontaneous breathing, nonlabored ventilation and respiratory function stable Cardiovascular status: blood pressure returned to baseline Anesthetic complications: no    Last Vitals:  Vitals:   04/25/17 1547 04/25/17 1600  BP: 123/69 130/83  Pulse: 69 70  Resp: 15 16  Temp: 36.8 C 36.4 C  SpO2: 98% 98%    Last Pain:  Vitals:   04/25/17 1649  TempSrc:   PainSc: 5                  Jazzalyn Loewenstein COKER

## 2017-04-25 NOTE — Anesthesia Procedure Notes (Signed)
Procedure Name: Intubation Date/Time: 04/25/2017 8:24 AM Performed by: Geraldo Docker Pre-anesthesia Checklist: Patient identified, Patient being monitored, Timeout performed, Emergency Drugs available and Suction available Patient Re-evaluated:Patient Re-evaluated prior to induction Oxygen Delivery Method: Circle System Utilized Preoxygenation: Pre-oxygenation with 100% oxygen Induction Type: IV induction Ventilation: Mask ventilation without difficulty Laryngoscope Size: Miller and 3 Grade View: Grade I Tube type: Oral Tube size: 7.0 mm Number of attempts: 1 Airway Equipment and Method: Stylet Placement Confirmation: ETT inserted through vocal cords under direct vision,  positive ETCO2 and breath sounds checked- equal and bilateral Secured at: 22 cm Tube secured with: Tape Dental Injury: Teeth and Oropharynx as per pre-operative assessment

## 2017-04-25 NOTE — Transfer of Care (Signed)
Immediate Anesthesia Transfer of Care Note  Patient: Deborah [name]  Procedure(s) Performed: Procedure(s): HARDWARE REMOVAL RIGHT KNEE (Right) PARTIAL EXCISION RIGHT TIBIA (Right)  Patient Location: PACU  Anesthesia Type:General  Level of Consciousness: awake, alert , oriented and patient cooperative  Airway & Oxygen Therapy: Patient Spontanous Breathing and Patient connected to nasal cannula oxygen  Post-op Assessment: Report given to RN, Post -op Vital signs reviewed and stable and Patient moving all extremities X 4  Post vital signs: Reviewed and stable  Last Vitals:  Vitals:   04/25/17 0609  BP: 117/64  Pulse: (!) 56  Resp: 18  Temp: 36.4 C  SpO2: 100%    Last Pain:  Vitals:   04/25/17 0722  TempSrc:   PainSc: 8       Patients Stated Pain Goal: 3 (04/25/17 0722)  Complications: No apparent anesthesia complications

## 2017-04-25 NOTE — Op Note (Signed)
NAMEANAIYA, WISINSKI          ACCOUNT NO.:  [id number]  MEDICAL RECORD NO.:  [id number]  LOCATION:                               FACILITY:  [facility]  PHYSICIAN:  Doralee Albino. Carola Frost, M.D. DATE OF BIRTH:  Jun 20, 1956  DATE OF PROCEDURE: DATE OF DISCHARGE:  04/27/2017                              OPERATIVE REPORT   PREOPERATIVE DIAGNOSES: 1. Infected hardware, right knee. 2. Osteomyelitis, right tibia, status post grade 3A open fracture.  POSTOPERATIVE DIAGNOSES: 1. Infected hardware, right knee. 2. Osteomyelitis, right tibia, status post grade 3A open fracture.  PROCEDURE: 1. Partial excision, right tibia osteomyelitis. 2. Removal of hardware, right knee. 3. Placement of antibiotic beads using vancomycin. 4. Application of small wound VAC.  SURGEON:  Doralee Albino. Carola Frost, M.D.  ASSISTANT:  None.  ANESTHESIA:  General.  COMPLICATIONS:  None.  TOURNIQUET:  None.  SPECIMENS:  Two from fibrinous material of the plate sent to Micro for anaerobic and aerobic culture.  DISPOSITION:  PACU.  CONDITION:  Stable.  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  The patient underwent staged debridements, eventual internal fixation, and was discharged with wounds in stable condition.  There appeared to be some confusion regarding wound care, which may have contributed to the patient presenting subsequently with a draining wound over the proximal aspect of the plate on the right.  We did attempt a local wound care, but the infection appeared to track deeply and progress.  Cultures would grow out MRSA switching her to doxycycline, did not resolve, but did improve the infection, which at this point was consistent with an osteomyelitis.  I discussed with the patient and her family the  risks and benefits of removal of the plate given the recent repair of her fracture including the possibility of occult nonunion that could result in a malunion, loss of reduction, need for further surgery, failure to resolve the infection.  We discussed not only the need to remove just the plate, but to curettage the bone and partially excise any infected material, doing this in a staged procedure with placement of antibiotic beads, wound VAC, returning to the OR within 48 hours for repeat washout and soft tissue advancement, possible closure if feasible.  The patient acknowledged these risks as did her family and they all wished to proceed.  BRIEF SUMMARY OF PROCEDURE:  The patient was taken to the operating room where general anesthesia was induced.  Because of the known infection and previous culture, she was given vancomycin preoperatively.  The right lower extremity was prepped and draped in usual sterile fashion with Chlorhexidine and Betadine scrub and paint.  The old incision was reopened at the knee, allowing exposure of the proximal screws. Distally, I then had to make a separate incision just to remove the plate.  It was unconnected with the osteo proximally.  Through this, I was able to expose the heads of the screws, removed them without complication, and then go back proximally where the plate was mobilized  and eventually withdrawn.  At that point, we focused extensively on the tibia where underneath the plate, there were some erosive contaminated areas.  Aggressive curettage was performed around this track and down through and involving the tibia cortical areas.  Small curettes were used within the screw tracts for a limited distance as well.  This was irrigated thoroughly with saline.  I then placed vancomycin impregnated beads within this track both distally and proximally.  Through-and-through lavage had been performed.  Wound VAC was placed over top of this setting  the pressure to 75 mmHg to remove the fluid, but not provide excessive suction given the potential for bleeding, not only because of removal of hardware, but also because of an INR level that was elevated because of the change in surgery schedule regarding plate removal as well as free flap placement by coordinated effort with plastic surgeon at [facility], Dr. Graylin Shiver. Following this, sterile dressing was applied from foot to thigh.  The patient was taken to PACU in stable condition.  PROGNOSIS:  Ms. Starkel will return to the OR Thursday morning for what we hope to be a repeat washout and closure over a new set of antibiotic beads.  We await the culture results and may adjust the antibiotics within the beads.  We will have a PICC line placed for long-term antibiotics, which we would expect to be 4 weeks given the removal of the hardware.  At this point, we will advance her to weightbearing bilaterally for transfers only.  She will discontinue the Coumadin and be on Lovenox as a bridge pending her rotational flap for the left proximal tibia.  This large bone defect that currently has a cement spacer and it will need to be grafted with reamed intramedullary aspirate from the left femur in 4-6 weeks after the free flap.  This plan again has been discussed and coordinated with my colleague, Dr. Graylin Shiver at [facility].     Doralee Albino. Carola Frost, M.D.     MHH/MEDQ  D:  04/25/2017  T:  04/25/2017  Job:  [id number]

## 2017-04-25 NOTE — Progress Notes (Signed)
Pt unable to urinate at this time.  

## 2017-04-25 NOTE — Progress Notes (Signed)
Pharmacy Antibiotic Note  Deborah [name] is a 61 y.o. female admitted on 04/25/2017 with osteomyelitis - office cx MRSA per MD. S/p hardware removal of R knee on 9/25.  Pharmacy has been consulted for vancomycin dosing. Afebrile, WBC wnl, SCr 0.67 on admit, CrCl~63.  Vancomycin 1g IV x 1 given at 0817 pre-op.  Plan: Vancomycin  IV q12h Ancef post-op per MD Monitor clinical progress, c/s, renal function F/u de-escalation plan/LOT, vancomycin trough as indicated   Height:  (165.1 cm) Weight: 118 lb (53.5 kg) IBW/kg (Calculated) : 57  Temp (24hrs), Avg:97.8 F (36.6 C), Min:97.3 F (36.3 C), Max:98.2 F (36.8 C)   Recent Labs Lab 04/25/17 0701  WBC 5.4  CREATININE 0.67    Estimated Creatinine Clearance: 63.2 mL/min (by C-G formula based on SCr of 0.67 mg/dL).    No Known Allergies  Babs Bertin, PharmD, BCPS Clinical Pharmacist Rx Phone # for today: [phone] After 3:30PM, please call Main Rx: [phone] 04/25/2017 4:06 PM

## 2017-04-25 NOTE — Brief Op Note (Signed)
04/25/2017  11:22 AM  PATIENT:  Deborah [name]  61 y.o. female  PRE-OPERATIVE DIAGNOSIS:   1. INFECTED HARWARE RIGHT KNEE 2. OSTEOMYELITIS RIGHT TIBIA S/P GRADE 3A OPEN FRACTURE   POST-OPERATIVE DIAGNOSIS:  1. INFECTED HARWARE RIGHT KNEE 2. OSTEOMYELITIS RIGHT TIBIA S/P GRADE 3A OPEN FRACTURE   PROCEDURE:  Procedure(s): 1. HARDWARE REMOVAL RIGHT KNEE (Right) 2. PARTIAL EXCISION RIGHT TIBIA (Right) 3. PLACEMENT OF ANTIBIOTIC BEADS WITH VANCOMYCIN 4. APPLICATION OF SMALL WOUND VAC  SURGEON:  Surgeon(s) and Role:    [name], MD - Primary  ASSISTANTS: none   ANESTHESIA:   general  EBL:  Total I/O In: 1050 [I.V.:800; IV Piggyback:250] Out: 50 [Blood:50]  BLOOD ADMINISTERED:none  DRAINS: WOUND VAC   LOCAL MEDICATIONS USED:  NONE  SPECIMEN:  Source of Specimen:  HARDWARE RIGHT TIBIA  DISPOSITION OF SPECIMEN:  MICRO  COUNTS:  YES  TOURNIQUET:    DICTATION: .Other Dictation: Dictation Number [id number]  PLAN OF CARE: Admit to inpatient   PATIENT DISPOSITION:  PACU - hemodynamically stable.   Delay start of Pharmacological VTE agent (>24hrs) due to surgical blood loss or risk of bleeding: INR is 2.3 now

## 2017-04-26 ENCOUNTER — Encounter (HOSPITAL_COMMUNITY): Payer: Self-pay | Admitting: Orthopedic Surgery

## 2017-04-26 DIAGNOSIS — M869 Osteomyelitis, unspecified: Secondary | ICD-10-CM | POA: Diagnosis present

## 2017-04-26 DIAGNOSIS — F32A Depression, unspecified: Secondary | ICD-10-CM | POA: Diagnosis present

## 2017-04-26 DIAGNOSIS — F329 Major depressive disorder, single episode, unspecified: Secondary | ICD-10-CM | POA: Diagnosis present

## 2017-04-26 LAB — BASIC METABOLIC PANEL
Anion gap: 7 (ref 5–15)
BUN: 15 mg/dL (ref 6–20)
CHLORIDE: 100 mmol/L — AB (ref 101–111)
CO2: 29 mmol/L (ref 22–32)
CREATININE: 0.62 mg/dL (ref 0.44–1.00)
Calcium: 8.8 mg/dL — ABNORMAL LOW (ref 8.9–10.3)
GFR calc Af Amer: >60 mL/min (ref >60)
GFR calc non Af Amer: >60 mL/min (ref >60)
GLUCOSE: 116 mg/dL — AB (ref 65–99)
POTASSIUM: 4.2 mmol/L (ref 3.5–5.1)
Sodium: 136 mmol/L (ref 135–145)

## 2017-04-26 LAB — CBC
HEMATOCRIT: 30.5 % — AB (ref 36.0–46.0)
Hemoglobin: 9.8 g/dL — ABNORMAL LOW (ref 12.0–15.0)
MCH: 29.7 pg (ref 26.0–34.0)
MCHC: 32.1 g/dL (ref 30.0–36.0)
MCV: 92.4 fL (ref 78.0–100.0)
PLATELETS: 202 10*3/uL (ref 150–400)
RBC: 3.3 MIL/uL — ABNORMAL LOW (ref 3.87–5.11)
RDW: 14.3 % (ref 11.5–15.5)
WBC: 8.7 10*3/uL (ref 4.0–10.5)

## 2017-04-26 MED ORDER — BUPROPION HCL ER (XL) 150 MG PO TB24
300.0000 mg | ORAL_TABLET | Freq: Every day | ORAL | Status: DC
  Administered 2017-04-26: 300 mg via ORAL
  Filled 2017-04-26: qty 2

## 2017-04-26 MED ORDER — PANTOPRAZOLE SODIUM 40 MG PO TBEC
40.0000 mg | DELAYED_RELEASE_TABLET | Freq: Every day | ORAL | Status: DC
  Administered 2017-04-26: 40 mg via ORAL
  Filled 2017-04-26: qty 1

## 2017-04-26 MED ORDER — VANCOMYCIN HCL IN DEXTROSE 750-5 MG/150ML-% IV SOLN
750.0000 mg | Freq: Two times a day (BID) | INTRAVENOUS | Status: DC
  Administered 2017-04-26 – 2017-04-27 (×3): 750 mg via INTRAVENOUS
  Filled 2017-04-26 (×4): qty 150

## 2017-04-26 MED ORDER — BREXPIPRAZOLE 2 MG PO TABS
2.0000 mg | ORAL_TABLET | Freq: Every day | ORAL | Status: DC
  Administered 2017-04-26: 2 mg via ORAL
  Filled 2017-04-26 (×3): qty 1

## 2017-04-26 MED ORDER — VILAZODONE HCL 40 MG PO TABS
40.0000 mg | ORAL_TABLET | Freq: Every day | ORAL | Status: DC
  Administered 2017-04-26: 40 mg via ORAL
  Filled 2017-04-26 (×2): qty 1

## 2017-04-26 MED ORDER — FERROUS GLUCONATE 324 (38 FE) MG PO TABS
324.0000 mg | ORAL_TABLET | Freq: Two times a day (BID) | ORAL | Status: DC
  Administered 2017-04-26 (×2): 324 mg via ORAL
  Filled 2017-04-26 (×4): qty 1

## 2017-04-26 MED ORDER — ADULT MULTIVITAMIN W/MINERALS CH
1.0000 | ORAL_TABLET | Freq: Every day | ORAL | Status: DC
  Administered 2017-04-26: 1 via ORAL
  Filled 2017-04-26: qty 1

## 2017-04-26 MED ORDER — SODIUM CHLORIDE 0.9% FLUSH: 10.0000 mL | INTRAVENOUS | Status: DC | PRN

## 2017-04-26 MED ORDER — INFLUENZA VAC SPLIT QUAD 0.5 ML IM SUSY: 0.5000 mL | PREFILLED_SYRINGE | INTRAMUSCULAR | Status: DC

## 2017-04-26 MED ORDER — LACTOBACILLUS PO TABS: 1.0000 | ORAL_TABLET | Freq: Two times a day (BID) | ORAL | Status: DC

## 2017-04-26 MED ORDER — BACID PO TABS
2.0000 | ORAL_TABLET | Freq: Two times a day (BID) | ORAL | Status: DC
  Administered 2017-04-26 (×2): 2 via ORAL
  Filled 2017-04-26 (×4): qty 2
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
## 2017-04-26 NOTE — Progress Notes (Signed)
[facility]  [facility] will provide home IV ABX for Deborah [name] upon DC to home.  [facility] Infusion Coordinator will provide in hospital teaching with the pt/family regarding IV ABX for independence at home.  [facility] will follow her hospital course to support DC to home.   If patient discharges after hours, please call [phone].   Sedalia Muta 04/26/2017, 11:32 AM

## 2017-04-26 NOTE — Progress Notes (Signed)
OT Cancellation Note  Patient Details Name: [name] MRN: [id number] DOB: 08-17-1955   Cancelled Treatment:    Reason Eval/Treat Not Completed: OT screened, no needs identified, will sign off.  Pt is for return to SNF possibly tomorrow, per notes.  OT eval not needed for authorization.  Will defer OT back to SNF.  Branden Shallenberger [place], OTR/L [phone]   [name] 04/26/2017, 4:51 PM

## 2017-04-26 NOTE — Progress Notes (Signed)
Peripherally Inserted Central Catheter/Midline Placement  The IV Nurse has discussed with the patient and/or persons authorized to consent for the patient, the purpose of this procedure and the potential benefits and risks involved with this procedure.  The benefits include less needle sticks, lab draws from the catheter, and the patient may be discharged home with the catheter. Risks include, but not limited to, infection, bleeding, blood clot (thrombus formation), and puncture of an artery; nerve damage and irregular heartbeat and possibility to perform a PICC exchange if needed/ordered by physician.  Alternatives to this procedure were also discussed.  Bard Power PICC patient education guide, fact sheet on infection prevention and patient information card has been provided to patient /or left at bedside.    PICC/Midline Placement Documentation  PICC Single Lumen 04/26/17 PICC Right Basilic 38 cm 0 cm (Active)  Indication for Insertion or Continuance of Line Home intravenous therapies (PICC only) 04/26/2017 11:00 AM  Exposed Catheter (cm) 0 cm 04/26/2017 11:00 AM  Dressing Change Due 05/03/17 04/26/2017 11:00 AM       Tonna Boehringer 04/26/2017, 11:33 AM

## 2017-04-26 NOTE — Anesthesia Preprocedure Evaluation (Addendum)
Anesthesia Evaluation  Patient identified by MRN, date of birth, ID band Patient awake    Reviewed: Allergy & Precautions, NPO status , Patient's Chart, lab work & pertinent test results  Airway Mallampati: II       Dental no notable dental hx.    Pulmonary Current Smoker, former smoker,    breath sounds clear to auscultation       Cardiovascular negative cardio ROS   Rhythm:Regular Rate:Tachycardia     Neuro/Psych    GI/Hepatic negative GI ROS, Neg liver ROS, GERD  ,  Endo/Other  negative endocrine ROS  Renal/GU negative Renal ROS     Musculoskeletal Multi trauma   Abdominal   Peds  Hematology  (+) anemia ,   Anesthesia Other Findings   Reproductive/Obstetrics                            Anesthesia Physical  Anesthesia Plan  ASA: III  Anesthesia Plan: General   Post-op Pain Management:    Induction: Intravenous  PONV Risk Score and Plan: 3 and Ondansetron, Dexamethasone, Propofol, Midazolam and Treatment may vary due to age or medical condition  Airway Management Planned: LMA and Oral ETT  Additional Equipment:   Intra-op Plan:   Post-operative Plan: Extubation in OR  Informed Consent: I have reviewed the patients History and Physical, chart, labs and discussed the procedure including the risks, benefits and alternatives for the proposed anesthesia with the patient or authorized representative who has indicated his/her understanding and acceptance.   Dental advisory given  Plan Discussed with: CRNA  Anesthesia Plan Comments:        Anesthesia Quick Evaluation
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
## 2017-04-27 ENCOUNTER — Encounter (HOSPITAL_COMMUNITY): Payer: Self-pay | Admitting: Orthopedic Surgery

## 2017-04-27 ENCOUNTER — Inpatient Hospital Stay (HOSPITAL_COMMUNITY): Payer: Medicare Other | Admitting: Anesthesiology

## 2017-04-27 ENCOUNTER — Inpatient Hospital Stay (HOSPITAL_COMMUNITY): Payer: Medicare Other

## 2017-04-27 ENCOUNTER — Encounter (HOSPITAL_COMMUNITY)
Admission: RE | Disposition: A | Discharge status: Skilled Nursing Facility | Payer: Self-pay | Source: Ambulatory Visit | Attending: Orthopedic Surgery

## 2017-04-27 DIAGNOSIS — S81802A Unspecified open wound, left lower leg, initial encounter: Secondary | ICD-10-CM | POA: Diagnosis present

## 2017-04-27 DIAGNOSIS — T847XXA Infection and inflammatory reaction due to other internal orthopedic prosthetic devices, implants and grafts, initial encounter: Secondary | ICD-10-CM

## 2017-04-27 HISTORY — PX: I & D EXTREMITY: SHX5045

## 2017-04-27 HISTORY — PX: PRIMARY CLOSURE: SHX6224

## 2017-04-27 LAB — BASIC METABOLIC PANEL
Anion gap: 7 (ref 5–15)
BUN: 13 mg/dL (ref 6–20)
CALCIUM: 8.6 mg/dL — AB (ref 8.9–10.3)
CHLORIDE: 99 mmol/L — AB (ref 101–111)
CO2: 27 mmol/L (ref 22–32)
CREATININE: 0.62 mg/dL (ref 0.44–1.00)
GFR calc Af Amer: >60 mL/min (ref >60)
GFR calc non Af Amer: >60 mL/min (ref >60)
GLUCOSE: 106 mg/dL — AB (ref 65–99)
Potassium: 3.7 mmol/L (ref 3.5–5.1)
Sodium: 133 mmol/L — ABNORMAL LOW (ref 135–145)

## 2017-04-27 LAB — CBC
HCT: 29.7 % — ABNORMAL LOW (ref 36.0–46.0)
HEMOGLOBIN: 9.5 g/dL — AB (ref 12.0–15.0)
MCH: 29.5 pg (ref 26.0–34.0)
MCHC: 32 g/dL (ref 30.0–36.0)
MCV: 92.2 fL (ref 78.0–100.0)
Platelets: 179 10*3/uL (ref 150–400)
RBC: 3.22 MIL/uL — AB (ref 3.87–5.11)
RDW: 14.1 % (ref 11.5–15.5)
WBC: 7.7 10*3/uL (ref 4.0–10.5)

## 2017-04-27 SURGERY — IRRIGATION AND DEBRIDEMENT EXTREMITY
Anesthesia: General | Site: Leg Lower | Laterality: Right

## 2017-04-27 MED ORDER — SODIUM CHLORIDE 0.9 % IR SOLN
Status: DC | PRN
Start: 2017-04-27 — End: 2017-04-27
  Administered 2017-04-27: 3000 mL

## 2017-04-27 MED ORDER — LIDOCAINE HCL (CARDIAC) 20 MG/ML IV SOLN
INTRAVENOUS | Status: DC | PRN
  Administered 2017-04-27: 20 mg via INTRAVENOUS

## 2017-04-27 MED ORDER — ROCURONIUM BROMIDE 10 MG/ML (PF) SYRINGE
PREFILLED_SYRINGE | INTRAVENOUS | Status: AC
  Filled 2017-04-27: qty 5

## 2017-04-27 MED ORDER — OXYCODONE HCL 5 MG PO TABS: ORAL_TABLET | ORAL | 0 refills | Status: DC

## 2017-04-27 MED ORDER — ACETAMINOPHEN 325 MG PO TABS: 650.0000 mg | ORAL_TABLET | Freq: Four times a day (QID) | ORAL | Status: DC | PRN

## 2017-04-27 MED ORDER — LIDOCAINE 2% (20 MG/ML) 5 ML SYRINGE
INTRAMUSCULAR | Status: AC
  Filled 2017-04-27: qty 5

## 2017-04-27 MED ORDER — HYDROMORPHONE HCL 1 MG/ML IJ SOLN
INTRAMUSCULAR | Status: DC | PRN
  Administered 2017-04-27: 0.5 mg via INTRAVENOUS

## 2017-04-27 MED ORDER — EPHEDRINE 5 MG/ML INJ
INTRAVENOUS | Status: AC
  Filled 2017-04-27: qty 10

## 2017-04-27 MED ORDER — EPHEDRINE SULFATE 50 MG/ML IJ SOLN
INTRAMUSCULAR | Status: DC | PRN
  Administered 2017-04-27: 5 mg via INTRAVENOUS

## 2017-04-27 MED ORDER — VANCOMYCIN IV (FOR PTA / DISCHARGE USE ONLY): 750.0000 mg | Freq: Two times a day (BID) | INTRAVENOUS | 0 refills | Status: DC

## 2017-04-27 MED ORDER — FENTANYL CITRATE (PF) 100 MCG/2ML IJ SOLN
INTRAMUSCULAR | Status: AC
  Filled 2017-04-27: qty 2

## 2017-04-27 MED ORDER — MEPERIDINE HCL 25 MG/ML IJ SOLN: 6.2500 mg | INTRAMUSCULAR | Status: DC | PRN

## 2017-04-27 MED ORDER — PHENYLEPHRINE 40 MCG/ML (10ML) SYRINGE FOR IV PUSH (FOR BLOOD PRESSURE SUPPORT)
PREFILLED_SYRINGE | INTRAVENOUS | Status: AC
  Filled 2017-04-27: qty 10

## 2017-04-27 MED ORDER — VANCOMYCIN HCL 1000 MG IV SOLR
INTRAVENOUS | Status: AC
  Filled 2017-04-27: qty 1000

## 2017-04-27 MED ORDER — PROPOFOL 10 MG/ML IV BOLUS
INTRAVENOUS | Status: AC
  Filled 2017-04-27: qty 20

## 2017-04-27 MED ORDER — DEXAMETHASONE SODIUM PHOSPHATE 10 MG/ML IJ SOLN
INTRAMUSCULAR | Status: AC
  Filled 2017-04-27: qty 1

## 2017-04-27 MED ORDER — FENTANYL CITRATE (PF) 100 MCG/2ML IJ SOLN
INTRAMUSCULAR | Status: DC | PRN
  Administered 2017-04-27: 25 ug via INTRAVENOUS
  Administered 2017-04-27: 50 ug via INTRAVENOUS
  Administered 2017-04-27: 25 ug via INTRAVENOUS
  Administered 2017-04-27 (×2): 50 ug via INTRAVENOUS
  Administered 2017-04-27 (×2): 25 ug via INTRAVENOUS

## 2017-04-27 MED ORDER — MIDAZOLAM HCL 5 MG/5ML IJ SOLN
INTRAMUSCULAR | Status: DC | PRN
  Administered 2017-04-27: 2 mg via INTRAVENOUS

## 2017-04-27 MED ORDER — VANCOMYCIN HCL IN DEXTROSE 750-5 MG/150ML-% IV SOLN: 750.0000 mg | Freq: Two times a day (BID) | INTRAVENOUS | 0 refills | Status: DC

## 2017-04-27 MED ORDER — HYDROMORPHONE HCL 1 MG/ML IJ SOLN
INTRAMUSCULAR | Status: AC
  Filled 2017-04-27: qty 0.5

## 2017-04-27 MED ORDER — 0.9 % SODIUM CHLORIDE (POUR BTL) OPTIME
TOPICAL | Status: DC | PRN
  Administered 2017-04-27: 1000 mL

## 2017-04-27 MED ORDER — MIDAZOLAM HCL 2 MG/2ML IJ SOLN
INTRAMUSCULAR | Status: AC
  Filled 2017-04-27: qty 2

## 2017-04-27 MED ORDER — SUGAMMADEX SODIUM 200 MG/2ML IV SOLN
INTRAVENOUS | Status: AC
  Filled 2017-04-27: qty 2

## 2017-04-27 MED ORDER — HYDROCODONE-ACETAMINOPHEN 5-325 MG PO TABS: 1.0000 | ORAL_TABLET | Freq: Four times a day (QID) | ORAL | 0 refills | Status: DC | PRN

## 2017-04-27 MED ORDER — LACTATED RINGERS IV SOLN
INTRAVENOUS | Status: DC
  Administered 2017-04-27: 08:00:00 via INTRAVENOUS

## 2017-04-27 MED ORDER — FENTANYL CITRATE (PF) 250 MCG/5ML IJ SOLN
INTRAMUSCULAR | Status: AC
  Filled 2017-04-27: qty 5

## 2017-04-27 MED ORDER — FENTANYL CITRATE (PF) 100 MCG/2ML IJ SOLN
25.0000 ug | INTRAMUSCULAR | Status: DC | PRN
  Administered 2017-04-27 (×4): 25 ug via INTRAVENOUS

## 2017-04-27 MED ORDER — SUCCINYLCHOLINE CHLORIDE 200 MG/10ML IV SOSY
PREFILLED_SYRINGE | INTRAVENOUS | Status: AC
  Filled 2017-04-27: qty 10

## 2017-04-27 MED ORDER — PROPOFOL 10 MG/ML IV BOLUS
INTRAVENOUS | Status: DC | PRN
  Administered 2017-04-27: 160 mg via INTRAVENOUS

## 2017-04-27 MED ORDER — ONDANSETRON HCL 4 MG/2ML IJ SOLN
INTRAMUSCULAR | Status: AC
  Filled 2017-04-27: qty 2

## 2017-04-27 MED ORDER — ONDANSETRON HCL 4 MG/2ML IJ SOLN: 4.0000 mg | Freq: Once | INTRAMUSCULAR | Status: DC | PRN

## 2017-04-27 MED ORDER — ONDANSETRON HCL 4 MG/2ML IJ SOLN
INTRAMUSCULAR | Status: DC | PRN
  Administered 2017-04-27: 4 mg via INTRAVENOUS

## 2017-04-27 MED ORDER — VANCOMYCIN HCL 1000 MG IV SOLR
INTRAVENOUS | Status: DC | PRN
  Administered 2017-04-27: 1000 mg via TOPICAL

## 2017-04-27 SURGICAL SUPPLY — 57 items
BANDAGE ACE 4X5 VEL STRL LF (GAUZE/BANDAGES/DRESSINGS) IMPLANT
BANDAGE ACE 6X5 VEL STRL LF (GAUZE/BANDAGES/DRESSINGS) ×1 IMPLANT
BNDG COHESIVE 4X5 TAN STRL (GAUZE/BANDAGES/DRESSINGS) ×2 IMPLANT
BNDG GAUZE ELAST 4 BULKY (GAUZE/BANDAGES/DRESSINGS) ×5 IMPLANT
BNDG GAUZE STRTCH 6 (GAUZE/BANDAGES/DRESSINGS) ×9 IMPLANT
BRUSH SCRUB SURG 4.25 DISP (MISCELLANEOUS) ×6 IMPLANT
CANISTER SUCT 3000ML PPV (MISCELLANEOUS) ×3 IMPLANT
CANISTER WOUND CARE 500ML ATS (WOUND CARE) ×3 IMPLANT
COVER SURGICAL LIGHT HANDLE (MISCELLANEOUS) ×5 IMPLANT
DRAPE HALF SHEET 40X57 (DRAPES) IMPLANT
DRAPE INCISE IOBAN 66X45 STRL (DRAPES) IMPLANT
DRAPE ORTHO SPLIT 77X108 STRL (DRAPES)
DRAPE SURG ORHT 6 SPLT 77X108 (DRAPES) IMPLANT
DRAPE U-SHAPE 47X51 STRL (DRAPES) ×3 IMPLANT
DRSG ADAPTIC 3X8 NADH LF (GAUZE/BANDAGES/DRESSINGS) ×3 IMPLANT
DRSG PAD ABDOMINAL 8X10 ST (GAUZE/BANDAGES/DRESSINGS) IMPLANT
DRSG VAC ATS LRG SENSATRAC (GAUZE/BANDAGES/DRESSINGS) IMPLANT
DRSG VAC ATS MED SENSATRAC (GAUZE/BANDAGES/DRESSINGS) IMPLANT
DRSG VAC ATS SM SENSATRAC (GAUZE/BANDAGES/DRESSINGS) ×1 IMPLANT
ELECT REM PT RETURN 9FT ADLT (ELECTROSURGICAL) ×3
ELECTRODE REM PT RTRN 9FT ADLT (ELECTROSURGICAL) ×2 IMPLANT
GAUZE SPONGE 4X4 12PLY STRL (GAUZE/BANDAGES/DRESSINGS) ×3 IMPLANT
GAUZE XEROFORM 5X9 LF (GAUZE/BANDAGES/DRESSINGS) ×3 IMPLANT
GLOVE BIO SURGEON STRL SZ7.5 (GLOVE) ×4 IMPLANT
GLOVE BIO SURGEON STRL SZ8 (GLOVE) ×4 IMPLANT
GLOVE BIOGEL PI IND STRL 7.5 (GLOVE) ×2 IMPLANT
GLOVE BIOGEL PI IND STRL 8 (GLOVE) ×4 IMPLANT
GLOVE BIOGEL PI INDICATOR 7.5 (GLOVE) ×2
GLOVE BIOGEL PI INDICATOR 8 (GLOVE) ×2
GOWN STRL REUS W/ TWL LRG LVL3 (GOWN DISPOSABLE) ×4 IMPLANT
GOWN STRL REUS W/ TWL XL LVL3 (GOWN DISPOSABLE) ×2 IMPLANT
GOWN STRL REUS W/TWL LRG LVL3 (GOWN DISPOSABLE) ×9
GOWN STRL REUS W/TWL XL LVL3 (GOWN DISPOSABLE) ×3
HANDPIECE INTERPULSE COAX TIP (DISPOSABLE)
KIT BASIN OR (CUSTOM PROCEDURE TRAY) ×3 IMPLANT
KIT DRSG PREVENA PLUS 7DAY 125 (MISCELLANEOUS) ×1 IMPLANT
KIT PREVENA INCISION MGT 13 (CANNISTER) ×1 IMPLANT
KIT ROOM TURNOVER OR (KITS) ×3 IMPLANT
KIT STIMULAN RAPID CURE 5CC (Orthopedic Implant) ×1 IMPLANT
MANIFOLD NEPTUNE II (INSTRUMENTS) ×2 IMPLANT
NS IRRIG 1000ML POUR BTL (IV SOLUTION) ×3 IMPLANT
PACK ORTHO EXTREMITY (CUSTOM PROCEDURE TRAY) ×3 IMPLANT
PAD ABD 8X10 STRL (GAUZE/BANDAGES/DRESSINGS) ×1 IMPLANT
PAD ARMBOARD 7.5X6 YLW CONV (MISCELLANEOUS) ×5 IMPLANT
PADDING CAST COTTON 6X4 STRL (CAST SUPPLIES) ×3 IMPLANT
SET HNDPC FAN SPRY TIP SCT (DISPOSABLE) IMPLANT
SPONGE LAP 18X18 X RAY DECT (DISPOSABLE) ×3 IMPLANT
STOCKINETTE IMPERVIOUS 9X36 MD (GAUZE/BANDAGES/DRESSINGS) ×2 IMPLANT
SUT PDS AB 2-0 CT1 27 (SUTURE) ×1 IMPLANT
SWAB CULTURE ESWAB REG 1ML (MISCELLANEOUS) IMPLANT
TOWEL OR 17X24 6PK STRL BLUE (TOWEL DISPOSABLE) ×3 IMPLANT
TOWEL OR 17X26 10 PK STRL BLUE (TOWEL DISPOSABLE) ×6 IMPLANT
TUBE CONNECTING 12X1/4 (SUCTIONS) ×3 IMPLANT
TUBING CYSTO DISP (UROLOGICAL SUPPLIES) ×1 IMPLANT
UNDERPAD 30X30 (UNDERPADS AND DIAPERS) ×3 IMPLANT
WATER STERILE IRR 1000ML POUR (IV SOLUTION) ×2 IMPLANT
YANKAUER SUCT BULB TIP NO VENT (SUCTIONS) ×3 IMPLANT

## 2017-04-27 NOTE — Discharge Instructions (Addendum)
Orthopaedic Trauma Service Discharge Instructions   General Discharge Instructions  WEIGHT BEARING STATUS: Weightbearing as tolerated Bilateral lower extremities for transfers  RANGE OF MOTION/ACTIVITY: unrestricted range of motion B knees   Wound Care: daily wound care to R leg starting on 04/29/2017. Leave incisional wound vac in place, it is to be removed on Monday 05/01/2017. Change dressing to R lower leg daily. Adaptic/oil emulsion gauze, 4x4's, abd, kerlix and ace from foot to knee   An extra wound VAC canister has been sent with the patient if needed  Left leg wound care per previous order   Diet: as you were eating previously.  Can use over the counter stool softeners and bowel preparations, such as Miralax, to help with bowel movements.  Narcotics can be constipating.  Be sure to drink plenty of fluids  PAIN MEDICATION USE AND EXPECTATIONS  You have likely been given narcotic medications to help control your pain.  After a traumatic event that results in an fracture (broken bone) with or without surgery, it is ok to use narcotic pain medications to help control one's pain.  We understand that everyone responds to pain differently and each individual patient will be evaluated on a regular basis for the continued need for narcotic medications. Ideally, narcotic medication use should last no more than 6-8 weeks (coinciding with fracture healing).   As a patient it is your responsibility as well to monitor narcotic medication use and report the amount and frequency you use these medications when you come to your office visit.   We would also advise that if you are using narcotic medications, you should take a dose prior to therapy to maximize you participation.  IF YOU ARE ON NARCOTIC MEDICATIONS IT IS NOT PERMISSIBLE TO OPERATE A MOTOR VEHICLE (MOTORCYCLE/CAR/TRUCK/MOPED) OR HEAVY MACHINERY DO NOT MIX NARCOTICS WITH OTHER CNS (CENTRAL NERVOUS SYSTEM) DEPRESSANTS SUCH AS ALCOHOL   STOP  SMOKING OR USING NICOTINE PRODUCTS!!!!  As discussed nicotine severely impairs your body's ability to heal surgical and traumatic wounds but also impairs bone healing.  Wounds and bone heal by forming microscopic blood vessels (angiogenesis) and nicotine is a vasoconstrictor (essentially, shrinks blood vessels).  Therefore, if vasoconstriction occurs to these microscopic blood vessels they essentially disappear and are unable to deliver necessary nutrients to the healing tissue.  This is one modifiable factor that you can do to dramatically increase your chances of healing your injury.    (This means no smoking, no nicotine gum, patches, etc)  DO NOT USE NONSTEROIDAL ANTI-INFLAMMATORY DRUGS (NSAID'S)  Using products such as Advil (ibuprofen), Aleve (naproxen), Motrin (ibuprofen) for additional pain control during fracture healing can delay and/or prevent the healing response.  If you would like to take over the counter (OTC) medication, Tylenol (acetaminophen) is ok.  However, some narcotic medications that are given for pain control contain acetaminophen as well. Therefore, you should not exceed more than 4000 mg of tylenol in a day if you do not have liver disease.  Also note that there are may OTC medicines, such as cold medicines and allergy medicines that my contain tylenol as well.  If you have any questions about medications and/or interactions please ask your doctor/PA or your pharmacist.      ICE AND ELEVATE INJURED/OPERATIVE EXTREMITY  Using ice and elevating the injured extremity above your heart can help with swelling and pain control.  Icing in a pulsatile fashion, such as 20 minutes on and 20 minutes off, can be followed.  Do not place ice directly on skin. Make sure there is a barrier between to skin and the ice pack.    Using frozen items such as frozen peas works well as the conform nicely to the are that needs to be iced.  USE AN ACE WRAP OR TED HOSE FOR SWELLING CONTROL  In  addition to icing and elevation, Ace wraps or TED hose are used to help limit and resolve swelling.  It is recommended to use Ace wraps or TED hose until you are informed to stop.    When using Ace Wraps start the wrapping distally (farthest away from the body) and wrap proximally (closer to the body)   Example: If you had surgery on your leg or thing and you do not have a splint on, start the ace wrap at the toes and work your way up to the thigh        If you had surgery on your upper extremity and do not have a splint on, start the ace wrap at your fingers and work your way up to the upper arm  IF YOU ARE IN A SPLINT OR CAST DO NOT REMOVE IT FOR ANY REASON   If your splint gets wet for any reason please contact the office immediately. You may shower in your splint or cast as long as you keep it dry.  This can be done by wrapping in a cast cover or garbage back (or similar)  Do Not stick any thing down your splint or cast such as pencils, money, or hangers to try and scratch yourself with.  If you feel itchy take benadryl as prescribed on the bottle for itching  IF YOU ARE IN A CAM BOOT (BLACK BOOT)  You may remove boot periodically. Perform daily dressing changes as noted below.  Wash the liner of the boot regularly and wear a sock when wearing the boot. It is recommended that you sleep in the boot until told otherwise  CALL THE OFFICE WITH ANY QUESTIONS OR CONCERNS: [phone]

## 2017-04-27 NOTE — Progress Notes (Signed)
Report called to Anita-RN at [facility]. All questions/concerns addressed. PTAR to transport. Will continue to monitor

## 2017-04-27 NOTE — Anesthesia Postprocedure Evaluation (Signed)
Anesthesia Post Note  Patient: Deborah [name]  Procedure(s) Performed: Procedure(s) (LRB): IRRIGATION AND DEBRIDEMENT RIGHT LEG (Right) PRIMARY CLOSURE (Right)     Patient location during evaluation: PACU Anesthesia Type: General Level of consciousness: awake and alert Pain management: pain level controlled Vital Signs Assessment: post-procedure vital signs reviewed and stable Respiratory status: spontaneous breathing, nonlabored ventilation, respiratory function stable and patient connected to nasal cannula oxygen Cardiovascular status: blood pressure returned to baseline and stable Postop Assessment: no apparent nausea or vomiting Anesthetic complications: no    Last Vitals:  Vitals:   04/26/17 1952 04/27/17 0545  BP: (!) 104/59 133/73  Pulse: 67 72  Resp:    Temp: 36.8 C 36.6 C  SpO2: 98% 99%    Last Pain:  Vitals:   04/27/17 0620  TempSrc:   PainSc: Asleep                 Reisha Wos

## 2017-04-27 NOTE — Clinical Social Work Note (Signed)
Clinical Social Work Assessment  Patient Details  Name: Deborah [name] MRN: [id number] Date of Birth: [birth year]  Date of referral:  04/27/17               Reason for consult:  Facility Placement                Permission sought to share information with:  Facility Industrial/product designer granted to share information::  Yes, Release of Information Signed  Name::       Deborah [name]  Agency::  [facility]  Relationship::   sister  Contact Information:     Housing/Transportation Living arrangements for the past 2 months:  Skilled Building surveyor of Information:  Patient Patient Interpreter Needed:  None Criminal Activity/Legal Involvement Pertinent to Current Situation/Hospitalization:  No - Comment as needed Significant Relationships:  Siblings, Friend Lives with:  Self Do you feel safe going back to the place where you live?  No Need for family participation in patient care:  No (Coment)  Care giving concerns:  Pt from [facility] for skilled nursing.  No FL2 needed per SNF. CSW will assist with transition back to SNF for continued skilled nursing need.  Social Worker assessment / plan:  CSW will assist with transition back to SNF. Pt amenable with transition back to facility. CSW discussed with SNF and will set up transportation once DC summary completed.  Employment status:  Disabled (Comment on whether or not currently receiving Disability) Insurance information:  Medicare PT Recommendations:  Skilled Nursing Facility Information / Referral to community resources:  Skilled Nursing Facility  Patient/Family's Response to care:  Patient and family appreciative of CSW assistance. No issues with care at this time.  Patient/Family's Understanding of and Emotional Response to Diagnosis, Current Treatment, and Prognosis:  Patient/family has good understanding of diagnosis, current treatment and prognosis. Pt hopeful that she will improve with  skilled nursing support and therapies.  No FL2 needed.   Emotional Assessment Appearance:  Appears stated age Attitude/Demeanor/Rapport:   (Cooperative) Affect (typically observed):  Accepting, Appropriate Orientation:  Oriented to Self, Oriented to Place, Oriented to Situation, Oriented to  Time Alcohol / Substance use:  Not Applicable Psych involvement (Current and /or in the community):  No (Comment)  Discharge Needs  Concerns to be addressed:  Care Coordination Readmission within the last 30 days:  Yes Current discharge risk:  Physical Impairment, Dependent with Mobility Barriers to Discharge:  No Barriers Identified   Tresa Moore, LCSW 04/27/2017, 11:47 AM

## 2017-04-27 NOTE — Clinical Social Work Placement (Addendum)
   CLINICAL SOCIAL WORK PLACEMENT  NOTE  Date:  04/27/2017  Patient Details  Name: [name] MRN: [id number] Date of Birth: [birth year]  Clinical Social Work is seeking post-discharge placement for this patient at the Skilled  Nursing Facility level of care (*CSW will initial, date and re-position this form in  chart as items are completed):  Yes   Patient/family provided with [facility] Clinical Social Work Department's list of facilities offering this level of care within the geographic area requested by the patient (or if unable, by the patient's family).  Yes   Patient/family informed of their freedom to choose among providers that offer the needed level of care, that participate in Medicare, Medicaid or managed care program needed by the patient, have an available bed and are willing to accept the patient.  Yes   Patient/family informed of [facility]'s ownership interest in [facility] and [facility], as well as of the fact that they are under no obligation to receive care at these facilities.  PASRR submitted to EDS on       PASRR number received on       Existing PASRR number confirmed on 04/27/17     FL2 transmitted to all facilities in geographic area requested by pt/family on       FL2 transmitted to all facilities within larger geographic area on 04/27/17     Patient informed that his/her managed care company has contracts with or will negotiate with certain facilities, including the following:        Yes   Patient/family informed of bed offers received.  Patient chooses bed at  [facility])     Physician recommends and patient chooses bed at      Patient to be transferred to  [facility]) on 04/27/17.  Patient to be transferred to facility by [facility]     Patient family notified on 04/27/17 of transfer.  Name of family member notified:  sister Marjean Donna notified     PHYSICIAN Please prepare prescriptions      Additional Comment:    _______________________________________________ Tresa Moore, LCSW 04/27/2017, 2:30 PM

## 2017-04-27 NOTE — Social Work (Signed)
Error

## 2017-04-27 NOTE — Social Work (Signed)
Clinical Social Worker facilitated patient discharge including contacting patient family and facility to confirm patient discharge plans.  Clinical information faxed to facility and family agreeable with plan.    CSW arranged ambulance transport via [facility] to [facility].    RN to call [phone] to give report prior to discharge.  Clinical Social Worker will sign off for now as social work intervention is no longer needed. Please consult us again if new need arises.  Jesusita Jocelyn, LCSW Clinical Social Worker [phone]    

## 2017-04-27 NOTE — Brief Op Note (Signed)
04/25/2017 - 04/27/2017  5:09 PM  PATIENT:  Deborah [name]  61 y.o. female  PRE-OPERATIVE DIAGNOSIS:   1. Right Tibia Osteomyelitis 2. Retained Antibiotic Beads 3. Open wounds right knee and leg   POST-OPERATIVE DIAGNOSIS:   1. Right Tibia Osteomyelitis 2. Retained Antibiotic Beads  3. Open wounds right knee and leg   PROCEDURE:  Procedure(s): 1. IRRIGATION AND DEBRIDEMENT RIGHT LEG (Right) WITH EXCISION OF SKIN AND SUBCUTANEOUS TISSUE AND DEEP MUSCLE FASCIA 2. REMOVAL WITH REINSERTION OF ANTIBIOTIC BEADS 3. COMPLEX CLOSURE 5 CM (Right knee) 4. LAYERED CLOSURE 6 CM (Right Leg/ shaft) 5. SMALL WOUND VAC  SURGEON:  Surgeon(s) and Role: [name], MD  PHYSICIAN ASSISTANT: Montez Morita, PA-C  ANESTHESIA:   general  EBL:  Total I/O In: 1020 [P.O.:120; I.V.:900] Out: 350 [Urine:300; Blood:50]  BLOOD ADMINISTERED:none  DRAINS: none   LOCAL MEDICATIONS USED:  NONE  SPECIMEN:  No Specimen  DISPOSITION OF SPECIMEN:  N/A  COUNTS:  YES  TOURNIQUET:  * No tourniquets in log *  DICTATION: .Other Dictation: Dictation Number [id number]  PLAN OF CARE: Admit to inpatient   PATIENT DISPOSITION:  PACU - hemodynamically stable.   Delay start of Pharmacological VTE agent (>24hrs) due to surgical blood loss or risk of bleeding: no

## 2017-04-27 NOTE — Transfer of Care (Signed)
Immediate Anesthesia Transfer of Care Note  Patient: Deborah [name]  Procedure(s) Performed: Procedure(s): IRRIGATION AND DEBRIDEMENT RIGHT LEG (Right) PRIMARY CLOSURE (Right)  Patient Location: PACU  Anesthesia Type:General  Level of Consciousness: awake, alert , oriented and patient cooperative  Airway & Oxygen Therapy: Patient Spontanous Breathing and Patient connected to face mask oxygen  Post-op Assessment: Report given to RN and Post -op Vital signs reviewed and stable  Post vital signs: Reviewed and stable  Last Vitals:  Vitals:   04/26/17 1952 04/27/17 0545  BP: (!) 104/59 133/73  Pulse: 67 72  Resp:    Temp: 36.8 C 36.6 C  SpO2: 98% 99%    Last Pain:  Vitals:   04/27/17 0620  TempSrc:   PainSc: Asleep      Patients Stated Pain Goal: 3 (04/25/17 1437)  Complications: No apparent anesthesia complications

## 2017-04-27 NOTE — Progress Notes (Signed)
I discussed with the patient the risks and benefits of surgery for her right tibia, including the possibility of persistent infection, nerve injury, vessel injury, wound breakdown, arthritis, DVT/ PE, loss of motion, malunion, nonunion, and need for further surgery among others.  She acknowledged these risks and wished to proceed.  [name], MD [facility] [phone] (p)

## 2017-04-27 NOTE — Discharge Summary (Signed)
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 OFFICE WITH ANY QUESTIONS OR CONCERNS: [phone]   04/27/17 1342     Follow-up Information    Altamese [place], MD. Schedule an appointment as soon as possible for a visit in 14 day(s).   Specialty:  Orthopedic Surgery Contact information: [place] [address] [phone]        Rene Paci, MD Follow up.   Specialty:  Plastic Surgery Why:  pre-admission testing scheduled for 04/28/2017 at 1130 surgery scheduled for 05/01/2017 at [facility] information: [place] [id number] [phone]           Discharge Instructions and Plan:  Follow-up at [facility] 04/28/2017 for preadmission testing for surgery on 05/01/2017  Follow-up with orthopedics in 10-14 days. Please call for appointment, [phone]  Daily dressing changes to right leg starting on 04/29/2017. Please leave wound VAC in place. Only change lower dressing. Adaptic, 4 x 4's, ABDs, Kerlix,  Ace from foot to thigh. There is an extra wound VAC canister available if needed  Weight-bear as tolerated bilateral lower extremities for transfers Unrestricted range of motion both knees Continue with range of motion exercises both passive and active of knees and ankles bilaterally.  Please call the office with any questions or concerns at [phone]  Signed:  Jari Pigg, PA-C [facility] [phone] (C) 04/27/2017, 1:56 PM

## 2017-04-27 NOTE — Anesthesia Procedure Notes (Signed)
Procedure Name: LMA Insertion Date/Time: 04/27/2017 8:34 AM Performed by: Adonis Housekeeper Pre-anesthesia Checklist: Patient identified, Emergency Drugs available, Suction available and Patient being monitored Patient Re-evaluated:Patient Re-evaluated prior to induction Oxygen Delivery Method: Circle system utilized Preoxygenation: Pre-oxygenation with 100% oxygen Induction Type: IV induction Ventilation: Mask ventilation without difficulty LMA: LMA inserted LMA Size: 4.0 Number of attempts: 1 Placement Confirmation: positive ETCO2 and breath sounds checked- equal and bilateral Tube secured with: Tape Dental Injury: Teeth and Oropharynx as per pre-operative assessment

## 2017-04-28 ENCOUNTER — Encounter (HOSPITAL_COMMUNITY): Payer: Self-pay | Admitting: Orthopedic Surgery

## 2017-04-30 LAB — AEROBIC/ANAEROBIC CULTURE (SURGICAL/DEEP WOUND)

## 2017-04-30 LAB — AEROBIC/ANAEROBIC CULTURE W GRAM STAIN (SURGICAL/DEEP WOUND)

## 2017-05-01 ENCOUNTER — Other Ambulatory Visit: Payer: Self-pay

## 2017-05-01 MED ORDER — OXYCODONE HCL 5 MG PO TABS: ORAL_TABLET | ORAL | 0 refills | Status: DC

## 2017-05-01 MED ORDER — TRAMADOL HCL ER 100 MG PO TB24: ORAL_TABLET | ORAL | 0 refills | Status: DC

## 2017-05-01 MED ORDER — ZOLPIDEM TARTRATE 5 MG PO TABS: 5.0000 mg | ORAL_TABLET | Freq: Every evening | ORAL | 0 refills | Status: DC | PRN

## 2017-05-01 MED ORDER — HYDROCODONE-ACETAMINOPHEN 5-325 MG PO TABS: 1.0000 | ORAL_TABLET | Freq: Four times a day (QID) | ORAL | 0 refills | Status: DC | PRN

## 2017-05-01 NOTE — Telephone Encounter (Signed)
RX faxed to [facility] @ [phone], phone number [phone] 
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
## 2017-05-03 ENCOUNTER — Encounter: Payer: Self-pay | Admitting: Adult Health

## 2017-05-03 ENCOUNTER — Other Ambulatory Visit: Payer: Self-pay

## 2017-05-03 ENCOUNTER — Non-Acute Institutional Stay (SKILLED_NURSING_FACILITY): Payer: Medicare Other | Admitting: Adult Health

## 2017-05-03 DIAGNOSIS — S82202J Unspecified fracture of shaft of left tibia, subsequent encounter for open fracture type IIIA, IIIB, or IIIC with delayed healing: Secondary | ICD-10-CM | POA: Diagnosis not present

## 2017-05-03 DIAGNOSIS — S82201J Unspecified fracture of shaft of right tibia, subsequent encounter for open fracture type IIIA, IIIB, or IIIC with delayed healing: Secondary | ICD-10-CM | POA: Diagnosis not present

## 2017-05-03 DIAGNOSIS — M869 Osteomyelitis, unspecified: Secondary | ICD-10-CM | POA: Diagnosis not present

## 2017-05-03 DIAGNOSIS — D62 Acute posthemorrhagic anemia: Secondary | ICD-10-CM

## 2017-05-03 DIAGNOSIS — K219 Gastro-esophageal reflux disease without esophagitis: Secondary | ICD-10-CM

## 2017-05-03 DIAGNOSIS — R339 Retention of urine, unspecified: Secondary | ICD-10-CM

## 2017-05-03 DIAGNOSIS — F332 Major depressive disorder, recurrent severe without psychotic features: Secondary | ICD-10-CM | POA: Diagnosis not present

## 2017-05-03 DIAGNOSIS — K5903 Drug induced constipation: Secondary | ICD-10-CM

## 2017-05-03 DIAGNOSIS — T402X5A Adverse effect of other opioids, initial encounter: Secondary | ICD-10-CM | POA: Diagnosis not present

## 2017-05-03 MED ORDER — OXYCODONE HCL 5 MG PO TABS: ORAL_TABLET | ORAL | 0 refills | Status: DC
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
## 2017-05-03 NOTE — Telephone Encounter (Signed)
RX faxed to [facility] @ [phone], phone number [phone] 

## 2017-05-04 ENCOUNTER — Non-Acute Institutional Stay (SKILLED_NURSING_FACILITY): Payer: Medicare Other | Admitting: Internal Medicine

## 2017-05-04 ENCOUNTER — Encounter: Payer: Self-pay | Admitting: Internal Medicine

## 2017-05-04 DIAGNOSIS — F332 Major depressive disorder, recurrent severe without psychotic features: Secondary | ICD-10-CM | POA: Diagnosis not present

## 2017-05-04 DIAGNOSIS — S060X0S Concussion without loss of consciousness, sequela: Secondary | ICD-10-CM | POA: Diagnosis not present

## 2017-05-04 DIAGNOSIS — D649 Anemia, unspecified: Secondary | ICD-10-CM

## 2017-05-04 DIAGNOSIS — Z967 Presence of other bone and tendon implants: Secondary | ICD-10-CM | POA: Diagnosis not present

## 2017-05-04 DIAGNOSIS — Z8781 Personal history of (healed) traumatic fracture: Secondary | ICD-10-CM

## 2017-05-04 DIAGNOSIS — A4902 Methicillin resistant Staphylococcus aureus infection, unspecified site: Secondary | ICD-10-CM

## 2017-05-04 DIAGNOSIS — R339 Retention of urine, unspecified: Secondary | ICD-10-CM

## 2017-05-04 DIAGNOSIS — Z9889 Other specified postprocedural states: Secondary | ICD-10-CM
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
## 2017-05-05 ENCOUNTER — Other Ambulatory Visit: Payer: Self-pay

## 2017-05-05 MED ORDER — ZOLPIDEM TARTRATE 5 MG PO TABS: 5.0000 mg | ORAL_TABLET | Freq: Every day | ORAL | 0 refills | Status: DC

## 2017-05-05 NOTE — Telephone Encounter (Signed)
RX faxed to [facility] @ [phone], phone number [phone] 

## 2017-05-08 ENCOUNTER — Other Ambulatory Visit: Payer: Self-pay

## 2017-05-08 MED ORDER — HYDROCODONE-ACETAMINOPHEN 10-325 MG PO TABS: 1.0000 | ORAL_TABLET | Freq: Four times a day (QID) | ORAL | 0 refills | Status: DC | PRN

## 2017-05-08 NOTE — Telephone Encounter (Signed)
RX faxed to [facility] @ [phone], phone number [phone] 

## 2017-05-11 ENCOUNTER — Encounter: Payer: Self-pay | Admitting: Adult Health

## 2017-05-11 NOTE — Progress Notes (Signed)
Entered in error

## 2017-05-15 ENCOUNTER — Non-Acute Institutional Stay (SKILLED_NURSING_FACILITY): Payer: Medicare Other | Admitting: Adult Health

## 2017-05-15 ENCOUNTER — Other Ambulatory Visit: Payer: Self-pay

## 2017-05-15 ENCOUNTER — Encounter: Payer: Self-pay | Admitting: Adult Health

## 2017-05-15 DIAGNOSIS — R251 Tremor, unspecified: Secondary | ICD-10-CM

## 2017-05-15 DIAGNOSIS — R258 Other abnormal involuntary movements: Secondary | ICD-10-CM

## 2017-05-15 MED ORDER — OXYCODONE HCL 10 MG PO TABS: ORAL_TABLET | ORAL | 0 refills | Status: DC

## 2017-05-15 NOTE — Telephone Encounter (Signed)
RX faxed to [facility] @ [phone], phone number [phone] 
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
## 2017-05-16 ENCOUNTER — Non-Acute Institutional Stay (SKILLED_NURSING_FACILITY): Payer: Medicare Other | Admitting: Adult Health

## 2017-05-16 ENCOUNTER — Encounter: Payer: Self-pay | Admitting: Adult Health

## 2017-05-16 DIAGNOSIS — S82202J Unspecified fracture of shaft of left tibia, subsequent encounter for open fracture type IIIA, IIIB, or IIIC with delayed healing: Secondary | ICD-10-CM | POA: Diagnosis not present

## 2017-05-16 DIAGNOSIS — S82201J Unspecified fracture of shaft of right tibia, subsequent encounter for open fracture type IIIA, IIIB, or IIIC with delayed healing: Secondary | ICD-10-CM | POA: Diagnosis not present

## 2017-05-16 DIAGNOSIS — M86461 Chronic osteomyelitis with draining sinus, right tibia and fibula: Secondary | ICD-10-CM

## 2017-05-16 LAB — BASIC METABOLIC PANEL
BUN: 17 (ref 4–21)
CREATININE: 0.6 (ref 0.5–1.1)
Glucose: 96
Potassium: 4.5 (ref 3.4–5.3)
Sodium: 140 (ref 137–147)
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
## 2017-05-29 ENCOUNTER — Encounter: Payer: Self-pay | Admitting: Internal Medicine

## 2017-05-29 ENCOUNTER — Non-Acute Institutional Stay (SKILLED_NURSING_FACILITY): Payer: Medicare Other | Admitting: Internal Medicine

## 2017-05-29 DIAGNOSIS — A4902 Methicillin resistant Staphylococcus aureus infection, unspecified site: Secondary | ICD-10-CM | POA: Diagnosis not present

## 2017-05-29 DIAGNOSIS — L270 Generalized skin eruption due to drugs and medicaments taken internally: Secondary | ICD-10-CM | POA: Diagnosis not present

## 2017-05-29 LAB — CBC AND DIFFERENTIAL
HEMATOCRIT: 39 (ref 36–46)
HEMOGLOBIN: 12.5 (ref 12.0–16.0)
Neutrophils Absolute: 4
PLATELETS: 248 (ref 150–399)
WBC: 6.6

## 2017-05-29 LAB — BASIC METABOLIC PANEL
BUN: 23 — AB (ref 4–21)
CREATININE: 0.6 (ref 0.5–1.1)
Glucose: 127
POTASSIUM: 4.3 (ref 3.4–5.3)
Sodium: 145 (ref 137–147)

## 2017-05-29 NOTE — Progress Notes (Signed)
Patient ID: GAGANDEEP KOSSMAN, female   DOB: 19-Dec-1955, 61 y.o.   MRN: [id number]     DATE:  May 29, 2017  Location:   [facility] Room Number: 101 A Place of Service: SNF (31)   Extended Emergency Contact Information Primary Emergency Contact: Lemanski,Una Address: [address], [place] [address] [place] Phone: [phone] Relation: Sister Secondary Emergency Contact: Banfill,Eileen Address: [address]          [place], [place] [address] [place] Phone: [phone] Relation: Mother  Advanced Directive information Does Patient Have a Medical Advance Directive?: Yes, Would patient like information on creating a medical advance directive?: No - Patient declined, Type of Advance Directive: Out of facility DNR (pink MOST or yellow form), Pre-existing out of facility DNR order (yellow form or pink MOST form): Yellow form placed in chart (order not valid for inpatient use), Does patient want to make changes to medical advance directive?: No - Patient declined  Chief Complaint  Patient presents with  . Acute Visit    Rash    HPI:  61 yo short term rehab female seen today for rash x 2 days. Rash began almost immediately after consuming [employer] Chicken on Saturday Oct 27th and prior to receiving PM dose of Vanco. She denies pruritis, CP, SOB, f/c. No new dizziness. 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 Vanco on hold since Oct 27th due to rash. She states she had Curry chicken last week also with no problems. She is a poor historian due to memory loss. Hx obtained from chart. Last vanco trough 05/03/17.  Past Medical History:  Diagnosis Date  . Depression   . Low back pain   . Neck pain   . Osteomyelitis of right tibia ([place])   . Traumatic open wound of left lower leg 04/27/2017    Past Surgical History:  Procedure Laterality Date  .  APPLICATION OF WOUND VAC Right 04/25/2017  . BONE EXCISION Right 04/25/2017   Procedure: PARTIAL EXCISION RIGHT TIBIA;  Surgeon: Altamese Rockwall, MD;  Location: [facility];  Service: Orthopedics;  Laterality: Right;  . EXTERNAL FIXATION LEG Bilateral 02/22/2017   Procedure: EXTERNAL FIXATION LEFT LOWER LEG;  Surgeon: Newt Minion, MD;  Location: [facility];  Service: Orthopedics;  Laterality: Bilateral;  . EXTERNAL FIXATION REMOVAL Bilateral 02/28/2017   Procedure: REMOVAL EXTERNAL FIXATION LEG;  Surgeon: Altamese Normanna, MD;  Location: [facility];  Service: Orthopedics;  Laterality: Bilateral;  . FACIAL LACERATION REPAIR N/A 02/22/2017   Procedure: FACIAL LACERATION REPAIR;  Surgeon: Newt Minion, MD;  Location: [facility];  Service: Orthopedics;  Laterality: N/A;  . HARDWARE REMOVAL Right 04/25/2017   Procedure: HARDWARE REMOVAL RIGHT KNEE;  Surgeon: Altamese [place] Grove, MD;  Location: [facility];  Service: Orthopedics;  Laterality: Right;  . I&D EXTREMITY Bilateral 02/22/2017   Procedure: IRRIGATION AND DEBRIDEMENT BILATERL LOWER EXTREMITIES;  Surgeon: Newt Minion, MD;  Location: [facility];  Service: Orthopedics;  Laterality: Bilateral;  . I&D EXTREMITY Bilateral 02/24/2017   Procedure: BILATERAL TIBIAS DEBRIDEMENT AND PLACEMENT OF ANTIBIOTIC BEADS LEFT TIBIAS;  Surgeon: Altamese Dougherty, MD;  Location: [facility];  Service: Orthopedics;  Laterality: Bilateral;  . I&D EXTREMITY Right 04/27/2017   Procedure: IRRIGATION AND DEBRIDEMENT RIGHT LEG;  Surgeon: Altamese Okreek, MD;  Location: [facility];  Service: Orthopedics;  Laterality: Right;  . ORIF TIBIA  FRACTURE Bilateral 02/28/2017   Procedure: OPEN REDUCTION INTERNAL FIXATION (ORIF) TIBIA FRACTURE;  Surgeon: Altamese Coburn, MD;  Location: [facility];  Service: Orthopedics;  Laterality: Bilateral;  . ORIF TIBIA PLATEAU Left 02/24/2017   Procedure: Open Reduction Internal Fixation Tibial Plateau;  Surgeon: Altamese [place], MD;  Location: [facility];  Service: Orthopedics;  Laterality: Left;  . PRIMARY  CLOSURE Right 04/27/2017   Procedure: PRIMARY CLOSURE;  Surgeon: Altamese [place] Point, MD;  Location: [facility];  Service: Orthopedics;  Laterality: Right;    Patient Care Team: Elwyn Reach, MD as PCP - General (Internal Medicine)  Social History   Social History  . Marital status: Single    Spouse name: N/A  . Number of children: N/A  . Years of education: N/A   Occupational History  . Not on file.   Social History Main Topics  . Smoking status: Former Smoker    Packs/day: 0.50    Types: Cigarettes    Quit date: 02/28/2017  . Smokeless tobacco: Never Used  . Alcohol use No  . Drug use: No  . Sexual activity: Not on file   Other Topics Concern  . Not on file   Social History Narrative  . No narrative on file     reports that she quit smoking about 2 months ago. Her smoking use included Cigarettes. She smoked 0.50 packs per day. She has never used smokeless tobacco. She reports that she does not drink alcohol or use drugs.  Family History  Problem Relation Age of Onset  . Leukemia Father   . Alcohol abuse Sister    Family Status  Relation Status  . Mother Alive       some what frail but in reasonably good health  . Father Deceased at age 29  . Sister (Not Specified)    Immunization History  Administered Date(s) Administered  . Influenza-Unspecified 04/27/2017  . PPD Test 03/30/2017  . Pneumococcal-Unspecified 03/20/2017  . Tdap 02/22/2017    No Known Allergies  Medications: Patient's Medications  New Prescriptions   No medications on file  Previous Medications   BETHANECHOL (URECHOLINE) 5 MG TABLET    Take 1 tablet (5 mg total) by mouth 3 (three) times daily.   BREXPIPRAZOLE (REXULTI) 2 MG TABS    Take 2 mg by mouth at bedtime.   BUPROPION (WELLBUTRIN XL) 300 MG 24 HR TABLET    Take 1 tablet (300 mg total) by mouth daily.   DIPHENHYDRAMINE (BENADRYL) 25 MG CAPSULE    Take 25 mg by mouth every 6 (six) hours as needed.   DOCUSATE SODIUM (COLACE) 100 MG  CAPSULE    Take 1 capsule (100 mg total) by mouth 2 (two) times daily.   FERROUS GLUCONATE (FERGON) 324 MG TABLET    Take 1 tablet (324 mg total) by mouth 2 (two) times daily with a meal.   METHOCARBAMOL (ROBAXIN) 500 MG TABLET    Take 1 tablet (500 mg total) by mouth every 8 (eight) hours.   MULTIPLE VITAMIN (MULTIVITAMIN WITH MINERALS) TABS TABLET    Take 1 tablet by mouth daily.   OXYCODONE HCL 10 MG TABS    Give 1 tablet by mouth every 4 hours.  Hold for sedation.   PANTOPRAZOLE (PROTONIX) 40 MG TABLET    Take 1 tablet (40 mg total) by mouth daily.   POLYETHYLENE GLYCOL (MIRALAX / GLYCOLAX) PACKET    Take 17 g by mouth daily.   TAMSULOSIN (FLOMAX) 0.4 MG CAPS CAPSULE  Take 1 capsule (0.4 mg total) by mouth daily.   VANCOMYCIN IVPB    Inject 750 mg into the vein every 12 (twelve) hours. Indication:  Osteomyelitis R leg  Last Day of Therapy:  06/07/2017 Labs - Sunday/Monday:  CBC/D, BMP, and vancomycin trough. Labs - Thursday:  BMP and vancomycin trough Labs - Every other week:  ESR and CRP   VILAZODONE HCL (VIIBRYD) 40 MG TABS    Take 40 mg by mouth every morning.   ZOLPIDEM (AMBIEN) 5 MG TABLET    Take 1 tablet (5 mg total) by mouth at bedtime.  Modified Medications   No medications on file  Discontinued Medications   No medications on file    Review of Systems  Unable to perform ROS: Other (memory loss)    Vitals:   05/29/17 1149  BP: 110/68  Pulse: 60  Resp: 18  Temp: 97.6 F (36.4 C)  SpO2: 99%  Weight: 125 lb 12.8 oz (57.1 kg)  Height: 5' 5"  (1.651 m)   Body mass index is 20.93 kg/m.  Physical Exam  Constitutional:  Frail appearing in NAD, sitting on bed  HENT:  Mouth/Throat: Oropharynx is clear and moist.  MMM; no oral lesions; no angioedema/tongue swelling  Eyes: No scleral icterus.  Neck: Neck supple.  Cardiovascular:  Murmur (1/6 SEM) heard. Right arm PICC line intact with no redness or d/c at insertion site; L>RLE +1 pitting edema; incisions healing well  with no wound dehiscence or d/c  Pulmonary/Chest: No respiratory distress. She has no wheezes. She has no rales. She exhibits no tenderness.  Abdominal: Soft. Bowel sounds are normal. She exhibits no distension and no mass. There is no tenderness. There is no rebound and no guarding.  Lymphadenopathy:    She has no cervical adenopathy.  Neurological: She is alert.  Skin: Skin is warm, dry and intact. Rash (generalized but worse on b/l UE and torso/neck) noted. Rash is urticarial (blancheable). Rash is not macular, not pustular and not vesicular. There is erythema. No pallor.  Psychiatric: She has a normal mood and affect. Her behavior is normal.     Labs reviewed: Nursing Home on 05/16/2017  Component Date Value Ref Range Status  . INR 03/29/2017 2.7* 0.9 - 1.1 Final  . Protime 03/29/2017 28.1* 10.0 - 13.8 Final  . Glucose 05/16/2017 96   Final  . BUN 05/16/2017 17  4 - 21 Final  . Creatinine 05/16/2017 0.6  0.5 - 1.1 Final  . Potassium 05/16/2017 4.5  3.4 - 5.3 Final  . Sodium 05/16/2017 140  137 - 147 Final  Nursing Home on 05/04/2017  Component Date Value Ref Range Status  . INR 04/10/2017 1.8* 0.9 - 1.1 Final  . Protime 04/10/2017 20.8* 10.0 - 13.8 Final  Admission on 04/25/2017, Discharged on 04/27/2017  Component Date Value Ref Range Status  . WBC 04/25/2017 5.4  4.0 - 10.5 K/uL Final  . RBC 04/25/2017 4.04  3.87 - 5.11 MIL/uL Final  . Hemoglobin 04/25/2017 11.8* 12.0 - 15.0 g/dL Final  . HCT 04/25/2017 37.9  36.0 - 46.0 % Final  . MCV 04/25/2017 93.8  78.0 - 100.0 fL Final  . MCH 04/25/2017 29.2  26.0 - 34.0 pg Final  . MCHC 04/25/2017 31.1  30.0 - 36.0 g/dL Final  . RDW 04/25/2017 14.4  11.5 - 15.5 % Final  . Platelets 04/25/2017 212  150 - 400 K/uL Final  . Neutrophils Relative % 04/25/2017 64  % Final  . Neutro Abs  04/25/2017 3.5  1.7 - 7.7 K/uL Final  . Lymphocytes Relative 04/25/2017 23  % Final  . Lymphs Abs 04/25/2017 1.2  0.7 - 4.0 K/uL Final  . Monocytes  Relative 04/25/2017 7  % Final  . Monocytes Absolute 04/25/2017 0.4  0.1 - 1.0 K/uL Final  . Eosinophils Relative 04/25/2017 5  % Final  . Eosinophils Absolute 04/25/2017 0.3  0.0 - 0.7 K/uL Final  . Basophils Relative 04/25/2017 1  % Final  . Basophils Absolute 04/25/2017 0.0  0.0 - 0.1 K/uL Final  . Sodium 04/25/2017 140  135 - 145 mmol/L Final  . Potassium 04/25/2017 4.1  3.5 - 5.1 mmol/L Final  . Chloride 04/25/2017 105  101 - 111 mmol/L Final  . CO2 04/25/2017 27  22 - 32 mmol/L Final  . Glucose, Bld 04/25/2017 102* 65 - 99 mg/dL Final  . BUN 04/25/2017 16  6 - 20 mg/dL Final  . Creatinine, Ser 04/25/2017 0.67  0.44 - 1.00 mg/dL Final  . Calcium 04/25/2017 9.2  8.9 - 10.3 mg/dL Final  . Total Protein 04/25/2017 5.8* 6.5 - 8.1 g/dL Final  . Albumin 04/25/2017 3.8  3.5 - 5.0 g/dL Final  . AST 04/25/2017 20  15 - 41 U/L Final  . ALT 04/25/2017 15  14 - 54 U/L Final  . Alkaline Phosphatase 04/25/2017 115  38 - 126 U/L Final  . Total Bilirubin 04/25/2017 0.2* 0.3 - 1.2 mg/dL Final  . GFR calc non Af Amer 04/25/2017 >60  >60 mL/min Final  . GFR calc Af Amer 04/25/2017 >60  >60 mL/min Final   Comment: (NOTE) The eGFR has been calculated using the CKD EPI equation. This calculation has not been validated in all clinical situations. eGFR's persistently <60 mL/min signify possible Chronic Kidney Disease.   . Anion gap 04/25/2017 8  5 - 15 Final  . Prothrombin Time 04/25/2017 25.4* 11.4 - 15.2 seconds Final  . INR 04/25/2017 2.33   Final  . aPTT 04/25/2017 39* 24 - 36 seconds Final   Comment:        IF BASELINE aPTT IS ELEVATED, SUGGEST PATIENT RISK ASSESSMENT BE USED TO DETERMINE APPROPRIATE ANTICOAGULANT THERAPY.   Marland Kitchen Specimen Description 04/25/2017 WOUND RIGHT   Final  . Special Requests 04/25/2017 RIGHT TIBIAL PLATE SWABS   Final  . Gram Stain 04/25/2017    Final                   Value:RARE WBC PRESENT,BOTH PMN AND MONONUCLEAR NO ORGANISMS SEEN   . Culture 04/25/2017     Final                   Value:NO ANAEROBES ISOLATED RARE ENTEROBACTER SPECIES   . Report Status 04/25/2017 04/30/2017 FINAL   Final  . Organism ID, Bacteria 04/25/2017 ENTEROBACTER SPECIES   Final  . WBC 04/25/2017 6.4  4.0 - 10.5 K/uL Final  . RBC 04/25/2017 3.76* 3.87 - 5.11 MIL/uL Final  . Hemoglobin 04/25/2017 11.0* 12.0 - 15.0 g/dL Final  . HCT 04/25/2017 34.7* 36.0 - 46.0 % Final  . MCV 04/25/2017 92.3  78.0 - 100.0 fL Final  . MCH 04/25/2017 29.3  26.0 - 34.0 pg Final  . MCHC 04/25/2017 31.7  30.0 - 36.0 g/dL Final  . RDW 04/25/2017 14.0  11.5 - 15.5 % Final  . Platelets 04/25/2017 204  150 - 400 K/uL Final  . Creatinine, Ser 04/25/2017 0.55  0.44 - 1.00 mg/dL Final  . GFR  calc non Af Amer 04/25/2017 >60  >60 mL/min Final  . GFR calc Af Amer 04/25/2017 >60  >60 mL/min Final   Comment: (NOTE) The eGFR has been calculated using the CKD EPI equation. This calculation has not been validated in all clinical situations. eGFR's persistently <60 mL/min signify possible Chronic Kidney Disease.   . Sodium 04/26/2017 136  135 - 145 mmol/L Final  . Potassium 04/26/2017 4.2  3.5 - 5.1 mmol/L Final  . Chloride 04/26/2017 100* 101 - 111 mmol/L Final  . CO2 04/26/2017 29  22 - 32 mmol/L Final  . Glucose, Bld 04/26/2017 116* 65 - 99 mg/dL Final  . BUN 04/26/2017 15  6 - 20 mg/dL Final  . Creatinine, Ser 04/26/2017 0.62  0.44 - 1.00 mg/dL Final  . Calcium 04/26/2017 8.8* 8.9 - 10.3 mg/dL Final  . GFR calc non Af Amer 04/26/2017 >60  >60 mL/min Final  . GFR calc Af Amer 04/26/2017 >60  >60 mL/min Final   Comment: (NOTE) The eGFR has been calculated using the CKD EPI equation. This calculation has not been validated in all clinical situations. eGFR's persistently <60 mL/min signify possible Chronic Kidney Disease.   . Anion gap 04/26/2017 7  5 - 15 Final  . WBC 04/26/2017 8.7  4.0 - 10.5 K/uL Final  . RBC 04/26/2017 3.30* 3.87 - 5.11 MIL/uL Final  . Hemoglobin 04/26/2017 9.8* 12.0 -  15.0 g/dL Final  . HCT 04/26/2017 30.5* 36.0 - 46.0 % Final  . MCV 04/26/2017 92.4  78.0 - 100.0 fL Final  . MCH 04/26/2017 29.7  26.0 - 34.0 pg Final  . MCHC 04/26/2017 32.1  30.0 - 36.0 g/dL Final  . RDW 04/26/2017 14.3  11.5 - 15.5 % Final  . Platelets 04/26/2017 202  150 - 400 K/uL Final  . Sodium 04/27/2017 133* 135 - 145 mmol/L Final  . Potassium 04/27/2017 3.7  3.5 - 5.1 mmol/L Final  . Chloride 04/27/2017 99* 101 - 111 mmol/L Final  . CO2 04/27/2017 27  22 - 32 mmol/L Final  . Glucose, Bld 04/27/2017 106* 65 - 99 mg/dL Final  . BUN 04/27/2017 13  6 - 20 mg/dL Final  . Creatinine, Ser 04/27/2017 0.62  0.44 - 1.00 mg/dL Final  . Calcium 04/27/2017 8.6* 8.9 - 10.3 mg/dL Final  . GFR calc non Af Amer 04/27/2017 >60  >60 mL/min Final  . GFR calc Af Amer 04/27/2017 >60  >60 mL/min Final   Comment: (NOTE) The eGFR has been calculated using the CKD EPI equation. This calculation has not been validated in all clinical situations. eGFR's persistently <60 mL/min signify possible Chronic Kidney Disease.   . Anion gap 04/27/2017 7  5 - 15 Final  . WBC 04/27/2017 7.7  4.0 - 10.5 K/uL Final  . RBC 04/27/2017 3.22* 3.87 - 5.11 MIL/uL Final  . Hemoglobin 04/27/2017 9.5* 12.0 - 15.0 g/dL Final  . HCT 04/27/2017 29.7* 36.0 - 46.0 % Final  . MCV 04/27/2017 92.2  78.0 - 100.0 fL Final  . MCH 04/27/2017 29.5  26.0 - 34.0 pg Final  . MCHC 04/27/2017 32.0  30.0 - 36.0 g/dL Final  . RDW 04/27/2017 14.1  11.5 - 15.5 % Final  . Platelets 04/27/2017 179  150 - 400 K/uL Final  Abstract on 04/25/2017  Component Date Value Ref Range Status  . INR 04/20/2017 3.4* 0.9 - 1.1 Final  . Protime 04/20/2017 33.6* 10.0 - 13.8 Final  Abstract on 04/24/2017  Component Date Value Ref  Range Status  . INR 04/24/2017 1.9* 0.9 - 1.1 Final  . Protime 04/24/2017 21.7* 10.0 - 13.8 Final  Abstract on 04/21/2017  Component Date Value Ref Range Status  . INR 04/21/2017 1.9* 0.9 - 1.1 Final  . Protime 04/21/2017 21.0*  10.0 - 13.8 Final  Abstract on 04/19/2017  Component Date Value Ref Range Status  . INR 04/19/2017 3.9* 0.9 - 1.1 Final  Abstract on 04/17/2017  Component Date Value Ref Range Status  . INR 04/17/2017 3.3* 0.9 - 1.1 Final  . Protime 04/17/2017 32.7* 10.0 - 13.8 Final  Abstract on 04/14/2017  Component Date Value Ref Range Status  . INR 04/14/2017 2.9* 0.9 - 1.1 Final  . Protime 04/14/2017 29.1* 10.0 - 13.8 Final  Abstract on 04/04/2017  Component Date Value Ref Range Status  . INR 04/04/2017 2.7* 0.9 - 1.1 Final  . Protime 04/04/2017 27.6* 10.0 - 13.8 Final  There may be more visits with results that are not included.    No results found.   Assessment/Plan   ICD-10-CM   1. Red man syndrome L27.0    +/- food allergy  2. Infection of wound due to methicillin resistant Staphylococcus aureus (MRSA) A49.02     reduce infusion rate of vanco to 138m/hr  PHARMACY TO DOSE VANCO - NEEDS VANCO TROUGH ON A SCHEDULED BASIS PER PHARMACY  Check vanco trough; cbc w diff and BMP STAT  T/c addition of benadryl +/- zantac with vanco in future if rash recurs  Cont other med as ordered  Cont contact isolation  PT/OT as ordered  F/u with specialists as scheduled. Nursing did s/w Ortho and was instructed to resume vanco as rash thought to be due to possible food allergy.  Will follow  Jamee Pacholski S. CPerlie Gold PAmery Hospital And Clinicand Adult Medicine [address] [phone] (Monday-Friday 8 AM - 5 PM) [phone] 5 PM and follow prompts

## 2017-06-01 ENCOUNTER — Encounter: Payer: Self-pay | Admitting: Adult Health

## 2017-06-01 ENCOUNTER — Non-Acute Institutional Stay (SKILLED_NURSING_FACILITY): Payer: Medicare Other | Admitting: Adult Health

## 2017-06-01 DIAGNOSIS — L270 Generalized skin eruption due to drugs and medicaments taken internally: Secondary | ICD-10-CM | POA: Diagnosis not present

## 2017-06-01 DIAGNOSIS — M86461 Chronic osteomyelitis with draining sinus, right tibia and fibula: Secondary | ICD-10-CM | POA: Diagnosis not present

## 2017-06-01 DIAGNOSIS — Z8739 Personal history of other diseases of the musculoskeletal system and connective tissue: Secondary | ICD-10-CM

## 2017-06-01 HISTORY — DX: Personal history of other diseases of the musculoskeletal system and connective tissue: Z87.39

## 2017-06-01 NOTE — Progress Notes (Signed)
Location:   [facility] Room Number: 101 A Place of Service:  SNF (31)   CODE STATUS: DNR  No Known Allergies  Chief Complaint  Patient presents with  . Acute Visit    Red Man syndrome    HPI:  She has been receiving IV vancomycin for her MRSA infection. She has been tolerating this medication until a couple of days ago when she developed a rash all over her body with itching and burning present. Her IV rate has been reduced; she was tried benadryl without relief. I have spoken with Dr. Eulas Post; who recommends po benadryl and zantac with a yet lower rate. She is willing to try this option.   Past Medical History:  Diagnosis Date  . Depression   . Low back pain   . Neck pain   . Osteomyelitis of right tibia ([place])   . Traumatic open wound of left lower leg 04/27/2017    Past Surgical History:  Procedure Laterality Date  . APPLICATION OF WOUND VAC Right 04/25/2017  . BONE EXCISION Right 04/25/2017   Procedure: PARTIAL EXCISION RIGHT TIBIA;  Surgeon: Altamese Davison, MD;  Location: [facility];  Service: Orthopedics;  Laterality: Right;  . EXTERNAL FIXATION LEG Bilateral 02/22/2017   Procedure: EXTERNAL FIXATION LEFT LOWER LEG;  Surgeon: Newt Minion, MD;  Location: [facility];  Service: Orthopedics;  Laterality: Bilateral;  . EXTERNAL FIXATION REMOVAL Bilateral 02/28/2017   Procedure: REMOVAL EXTERNAL FIXATION LEG;  Surgeon: Altamese Pierre, MD;  Location: [facility];  Service: Orthopedics;  Laterality: Bilateral;  . FACIAL LACERATION REPAIR N/A 02/22/2017   Procedure: FACIAL LACERATION REPAIR;  Surgeon: Newt Minion, MD;  Location: [place] [facility];  Service: Orthopedics;  Laterality: N/A;  . HARDWARE REMOVAL Right 04/25/2017   Procedure: HARDWARE REMOVAL RIGHT KNEE;  Surgeon: Altamese Marble, MD;  Location: [facility];  Service: Orthopedics;  Laterality: Right;  . I&D EXTREMITY Bilateral 02/22/2017   Procedure: IRRIGATION AND DEBRIDEMENT BILATERL LOWER EXTREMITIES;  Surgeon: Newt Minion, MD;   Location: [facility];  Service: Orthopedics;  Laterality: Bilateral;  . I&D EXTREMITY Bilateral 02/24/2017   Procedure: BILATERAL TIBIAS DEBRIDEMENT AND PLACEMENT OF ANTIBIOTIC BEADS LEFT TIBIAS;  Surgeon: Altamese Jerome, MD;  Location: [facility];  Service: Orthopedics;  Laterality: Bilateral;  . I&D EXTREMITY Right 04/27/2017   Procedure: IRRIGATION AND DEBRIDEMENT RIGHT LEG;  Surgeon: Altamese St. Stephens, MD;  Location: [facility];  Service: Orthopedics;  Laterality: Right;  . ORIF TIBIA FRACTURE Bilateral 02/28/2017   Procedure: OPEN REDUCTION INTERNAL FIXATION (ORIF) TIBIA FRACTURE;  Surgeon: Altamese Hop Bottom, MD;  Location: [facility];  Service: Orthopedics;  Laterality: Bilateral;  . ORIF TIBIA PLATEAU Left 02/24/2017   Procedure: Open Reduction Internal Fixation Tibial Plateau;  Surgeon: Altamese Eagle, MD;  Location: [facility];  Service: Orthopedics;  Laterality: Left;  . PRIMARY CLOSURE Right 04/27/2017   Procedure: PRIMARY CLOSURE;  Surgeon: Altamese Garden, MD;  Location: [facility];  Service: Orthopedics;  Laterality: Right;    Social History   Social History  . Marital status: Single    Spouse name: N/A  . Number of children: N/A  . Years of education: N/A   Occupational History  . Not on file.   Social History Main Topics  . Smoking status: Former Smoker    Packs/day: 0.50    Types: Cigarettes    Quit date: 02/28/2017  . Smokeless tobacco: Never Used  . Alcohol use No  . Drug use: No  . Sexual activity: Not on file  Other Topics Concern  . Not on file   Social History Narrative  . No narrative on file   Family History  Problem Relation Age of Onset  . Leukemia Father   . Alcohol abuse Sister       VITAL SIGNS BP 136/72   Pulse 62   Temp 97.9 F (36.6 C)   Resp 18   Ht 5' 5"  (1.651 m)   Wt 125 lb 12.8 oz (57.1 kg)   SpO2 97%   BMI 20.93 kg/m    Patient's Medications  New Prescriptions   No medications on file  Previous Medications   BETHANECHOL (URECHOLINE) 5 MG TABLET    Take 1  tablet (5 mg total) by mouth 3 (three) times daily.   BREXPIPRAZOLE (REXULTI) 2 MG TABS    Take 2 mg by mouth at bedtime.   BUPROPION (WELLBUTRIN XL) 300 MG 24 HR TABLET    Take 1 tablet (300 mg total) by mouth daily.   DIPHENHYDRAMINE (BENADRYL) 25 MG CAPSULE    Take 25 mg by mouth every 4 (four) hours as needed.    DOCUSATE SODIUM (COLACE) 100 MG CAPSULE    Take 1 capsule (100 mg total) by mouth 2 (two) times daily.   FERROUS GLUCONATE (FERGON) 324 MG TABLET    Take 1 tablet (324 mg total) by mouth 2 (two) times daily with a meal.   METHOCARBAMOL (ROBAXIN) 500 MG TABLET    Take 1 tablet (500 mg total) by mouth every 8 (eight) hours.   MULTIPLE VITAMIN (MULTIVITAMIN WITH MINERALS) TABS TABLET    Take 1 tablet by mouth daily.   OXYCODONE HCL 10 MG TABS    Give 1 tablet by mouth every 4 hours.  Hold for sedation.   PANTOPRAZOLE (PROTONIX) 40 MG TABLET    Take 1 tablet (40 mg total) by mouth daily.   POLYETHYLENE GLYCOL (MIRALAX / GLYCOLAX) PACKET    Take 17 g by mouth daily.   TAMSULOSIN (FLOMAX) 0.4 MG CAPS CAPSULE    Take 1 capsule (0.4 mg total) by mouth daily.   VANCOMYCIN IVPB    Inject 750 mg into the vein every 12 (twelve) hours. Indication:  Osteomyelitis R leg  Last Day of Therapy:  06/07/2017 Labs - Sunday/Monday:  CBC/D, BMP, and vancomycin trough. Labs - Thursday:  BMP and vancomycin trough Labs - Every other week:  ESR and CRP   VILAZODONE HCL (VIIBRYD) 40 MG TABS    Take 40 mg by mouth every morning.   ZOLPIDEM (AMBIEN) 5 MG TABLET    Take 1 tablet (5 mg total) by mouth at bedtime.  Modified Medications   No medications on file  Discontinued Medications   No medications on file     SIGNIFICANT DIAGNOSTIC EXAMS  PREVIOUS:   03-12-17: ct of head: 1. Evolving bifrontal contusions. 2. Resolving hemorrhage. 3. No new hemorrhage the. 4. Scattered subcortical white matter hypoattenuation bilaterally. This may reflect traumatic injury, but is more likely related to chronic  ischemia. 5. Stable occipital skull fractures. 6. Stable nasal bone fractures  NO NEW EXAMS   LABS REVIEWED: PREVIOUS:   02-23-17; wbc 12.2; hgb 7.9; hct 23.5; mcv 91.8; plt 204; glucose 165; bun 17; creat 0.74; k+ 3.8; na++ 140; ca 7.7;  02-28-17: wbc 7.4; hgb 9.6; hct 28.3; mcv 87.6; plt 211; glucose 102; bun 9; creat 0.47; k+ 3.9; na++ 134; ca 7.7; liver normal albumin 2.1 03-03-17: wbc 9.5; hgb 8.1; hct 24.0; mcv 86.6; plt 281; glucose  96; bun 6; creat 0.40; k+ 3.7; na++ 125; ca 7.6 03-14-17: wbc 8.7; hgb 8.1; hct 25.2; mcv 92.3; plt 573; glucose 139; bun 11; creat 0.56; k+ 3.7; na++ 138; ca 8.5  04-25-17: wbc 5.4; hgb 11.8; hct 37.9; mcv 93.8; plt 212; glucose 102; bun 16; creat 0.67; k+ 4.1; na++ 140; ca 9.2; total bili 0.2; albumin 3.8 04-27-17: wbc 7.7; hgb 9.5; hct 29.7; mcv 92.2; plt 179; glucose 106; bun 13; creat 0.62; k+ 3.7; na++ 133; ca 8.6  NO NEW LABS    Review of Systems  Constitutional: Negative for malaise/fatigue.  Respiratory: Negative for cough and shortness of breath.   Cardiovascular: Negative for chest pain, palpitations and leg swelling.  Gastrointestinal: Negative for abdominal pain, constipation and heartburn.  Musculoskeletal: Negative for back pain, joint pain and myalgias.  Skin: Positive for itching and rash.  Neurological: Negative for dizziness.  Psychiatric/Behavioral: The patient is not nervous/anxious.     Physical Exam  Constitutional: She is oriented to person, place, and time. She appears well-developed and well-nourished. No distress.  Thin   Neck: Neck supple. No thyromegaly present.  Abdominal: Soft. Bowel sounds are normal. She exhibits no distension. There is no tenderness.  Musculoskeletal: She exhibits no edema.  Lymphadenopathy:    She has no cervical adenopathy.  Neurological: She is alert and oriented to person, place, and time.  Skin: Skin is warm and dry. Rash noted. She is not diaphoretic.  Has fine red rash with itching on arms legs  and trunks does have itching an burning present   Psychiatric: She has a normal mood and affect.    ASSESSMENT/ PLAN:  1. Red man syndrome 2. MRSA infection due to osteomyelitis  Will decreased IV rate  Of vancomycin to 75 cc per hour Will begin benadryl 25 mg po and zantac 300 mg po for pre-med and will monitor '  MD is aware of resident's narcotic use and is in agreement with current plan of care. We will attempt to wean resident as apropriate   Ok Edwards NP [facility]  Contact [phone] Monday through Friday 8am- 5pm  After hours call [phone]

## 2017-06-05 ENCOUNTER — Encounter (HOSPITAL_COMMUNITY): Payer: Self-pay | Admitting: *Deleted

## 2017-06-05 NOTE — Progress Notes (Signed)
Pt resides at [facility]. Pt nurse, [place], LPN, completed pt SDW-pre-op call. Nurse denies that pt C/O SOB and chest pain. Nurse denies that pt is under the care of a cardiologist. Nurse denies a record of stress , echo and cardiac cath studies . Please complete anesthesia assessment with pt on DOS. Nurse confirmed receipt of and reviewed faxed pre-op instructions. Nurse verbalized understanding of all pre-op instructions.

## 2017-06-05 NOTE — Pre-Procedure Instructions (Signed)
    Deborah GrinderBernadette M [name]  06/05/2017      [id number]- [place], [place] - [id number]  [place] [id number] Phone: [phone] Fax: [phone]  [facility] - [place] - [id number] [place] [address] [place] [id number] Phone: [phone] Fax: [phone]    Your procedure is scheduled on Tuesday, June 06, 2017  [facility] at 10:00 A.M.  Call this number if you have problems the morning of surgery:  [phone]   Remember:  Do not eat food or drink liquids after midnight.  Take these medicines the morning of surgery with A SIP OF WATER:  bethanechol (URECHOLINE),  buPROPion (WELLBUTRIN XL),  pantoprazole (PROTONIX),  Vilazodone (VIIBRYD), Oxycodone, Benedryl, Ranitidine (Zantac) Stop taking Aspirin, vitamins, fish oil, and herbal medications. Do not take any NSAIDs ie: Ibuprofen, Advil, Naproxen (Aleve), Motrin, BC and Goody Powder ; stop now.  Do not wear jewelry, make-up or nail polish.  Do not wear lotions, powders, or perfumes, or deoderant.  Do not shave 48 hours prior to surgery.    Do not bring valuables to the hospital.  [facility] is not responsible for any belongings or valuables.  Contacts, dentures or bridgework may not be worn into surgery.  Leave your suitcase in the car.  After surgery it may be brought to your room. For patients admitted to the hospital, discharge time will be determined by your treatment team. Patients discharged the day of surgery will not be allowed to drive home.  Please read over the following fact sheets that you were given.

## 2017-06-06 ENCOUNTER — Inpatient Hospital Stay (HOSPITAL_COMMUNITY)
Admission: RE | Admit: 2017-06-06 | Discharge: 2017-06-08 | DRG: 493 | Disposition: A | Discharge status: Skilled Nursing Facility | Payer: Medicare Other | Source: Ambulatory Visit | Attending: Orthopedic Surgery | Admitting: Orthopedic Surgery

## 2017-06-06 ENCOUNTER — Inpatient Hospital Stay (HOSPITAL_COMMUNITY): Payer: Medicare Other

## 2017-06-06 ENCOUNTER — Inpatient Hospital Stay (HOSPITAL_COMMUNITY): Payer: Medicare Other | Admitting: Anesthesiology

## 2017-06-06 ENCOUNTER — Encounter: Payer: Self-pay | Admitting: Adult Health

## 2017-06-06 ENCOUNTER — Encounter (HOSPITAL_COMMUNITY)
Admission: RE | Disposition: A | Discharge status: Skilled Nursing Facility | Payer: Self-pay | Source: Ambulatory Visit | Attending: Orthopedic Surgery

## 2017-06-06 ENCOUNTER — Other Ambulatory Visit: Payer: Self-pay

## 2017-06-06 DIAGNOSIS — S82202M Unspecified fracture of shaft of left tibia, subsequent encounter for open fracture type I or II with nonunion: Secondary | ICD-10-CM | POA: Diagnosis not present

## 2017-06-06 DIAGNOSIS — G8929 Other chronic pain: Secondary | ICD-10-CM | POA: Diagnosis present

## 2017-06-06 DIAGNOSIS — Z881 Allergy status to other antibiotic agents status: Secondary | ICD-10-CM | POA: Diagnosis not present

## 2017-06-06 DIAGNOSIS — Z79899 Other long term (current) drug therapy: Secondary | ICD-10-CM

## 2017-06-06 DIAGNOSIS — K219 Gastro-esophageal reflux disease without esophagitis: Secondary | ICD-10-CM | POA: Diagnosis present

## 2017-06-06 DIAGNOSIS — M86461 Chronic osteomyelitis with draining sinus, right tibia and fibula: Secondary | ICD-10-CM

## 2017-06-06 DIAGNOSIS — Z87891 Personal history of nicotine dependence: Secondary | ICD-10-CM | POA: Diagnosis not present

## 2017-06-06 DIAGNOSIS — D62 Acute posthemorrhagic anemia: Secondary | ICD-10-CM | POA: Diagnosis not present

## 2017-06-06 DIAGNOSIS — S82143K Displaced bicondylar fracture of unspecified tibia, subsequent encounter for closed fracture with nonunion: Secondary | ICD-10-CM

## 2017-06-06 DIAGNOSIS — J449 Chronic obstructive pulmonary disease, unspecified: Secondary | ICD-10-CM | POA: Diagnosis present

## 2017-06-06 DIAGNOSIS — Z419 Encounter for procedure for purposes other than remedying health state, unspecified: Secondary | ICD-10-CM

## 2017-06-06 DIAGNOSIS — S82209A Unspecified fracture of shaft of unspecified tibia, initial encounter for closed fracture: Secondary | ICD-10-CM | POA: Insufficient documentation

## 2017-06-06 DIAGNOSIS — S82252N Displaced comminuted fracture of shaft of left tibia, subsequent encounter for open fracture type IIIA, IIIB, or IIIC with nonunion: Secondary | ICD-10-CM | POA: Diagnosis present

## 2017-06-06 DIAGNOSIS — F332 Major depressive disorder, recurrent severe without psychotic features: Secondary | ICD-10-CM | POA: Diagnosis present

## 2017-06-06 DIAGNOSIS — S82102K Unspecified fracture of upper end of left tibia, subsequent encounter for closed fracture with nonunion: Secondary | ICD-10-CM | POA: Diagnosis present

## 2017-06-06 HISTORY — DX: Insomnia, unspecified: G47.00

## 2017-06-06 HISTORY — PX: ORIF TIBIA FRACTURE: SHX5416

## 2017-06-06 HISTORY — DX: Restless legs syndrome: G25.81

## 2017-06-06 HISTORY — DX: Gastro-esophageal reflux disease without esophagitis: K21.9

## 2017-06-06 LAB — COMPREHENSIVE METABOLIC PANEL
ALBUMIN: 4 g/dL (ref 3.5–5.0)
ALT: 26 U/L (ref 14–54)
ANION GAP: 8 (ref 5–15)
AST: 21 U/L (ref 15–41)
Alkaline Phosphatase: 109 U/L (ref 38–126)
BILIRUBIN TOTAL: 0.5 mg/dL (ref 0.3–1.2)
BUN: 14 mg/dL (ref 6–20)
CO2: 26 mmol/L (ref 22–32)
Calcium: 9.5 mg/dL (ref 8.9–10.3)
Chloride: 103 mmol/L (ref 101–111)
Creatinine, Ser: 0.7 mg/dL (ref 0.44–1.00)
GFR calc Af Amer: >60 mL/min (ref >60)
GFR calc non Af Amer: >60 mL/min (ref >60)
GLUCOSE: 91 mg/dL (ref 65–99)
POTASSIUM: 4.3 mmol/L (ref 3.5–5.1)
SODIUM: 137 mmol/L (ref 135–145)
TOTAL PROTEIN: 6.2 g/dL — AB (ref 6.5–8.1)

## 2017-06-06 LAB — URINALYSIS, ROUTINE W REFLEX MICROSCOPIC
Bilirubin Urine: NEGATIVE
GLUCOSE, UA: NEGATIVE mg/dL
Ketones, ur: NEGATIVE mg/dL
NITRITE: POSITIVE — AB
PH: 7 (ref 5.0–8.0)
PROTEIN: NEGATIVE mg/dL
RBC / HPF: NONE SEEN RBC/hpf (ref 0–5)
SPECIFIC GRAVITY, URINE: 1.019 (ref 1.005–1.030)

## 2017-06-06 LAB — CBC WITH DIFFERENTIAL/PLATELET
BASOS ABS: 0.1 10*3/uL (ref 0.0–0.1)
BASOS PCT: 1 %
EOS ABS: 0.7 10*3/uL (ref 0.0–0.7)
Eosinophils Relative: 11 %
HEMATOCRIT: 40.6 % (ref 36.0–46.0)
HEMOGLOBIN: 12.8 g/dL (ref 12.0–15.0)
Lymphocytes Relative: 27 %
Lymphs Abs: 1.7 10*3/uL (ref 0.7–4.0)
MCH: 29 pg (ref 26.0–34.0)
MCHC: 31.5 g/dL (ref 30.0–36.0)
MCV: 91.9 fL (ref 78.0–100.0)
MONO ABS: 0.5 10*3/uL (ref 0.1–1.0)
Monocytes Relative: 8 %
NEUTROS ABS: 3.2 10*3/uL (ref 1.7–7.7)
NEUTROS PCT: 53 %
Platelets: 226 10*3/uL (ref 150–400)
RBC: 4.42 MIL/uL (ref 3.87–5.11)
RDW: 14.2 % (ref 11.5–15.5)
WBC: 6.2 10*3/uL (ref 4.0–10.5)

## 2017-06-06 LAB — PROTIME-INR
INR: 0.97
Prothrombin Time: 12.8 seconds (ref 11.4–15.2)

## 2017-06-06 LAB — SEDIMENTATION RATE: Sed Rate: 2 mm/hr (ref 0–22)

## 2017-06-06 LAB — C-REACTIVE PROTEIN: CRP: <0.8 mg/dL (ref <1.0)

## 2017-06-06 LAB — APTT: APTT: 32 s (ref 24–36)

## 2017-06-06 SURGERY — OPEN REDUCTION INTERNAL FIXATION (ORIF) TIBIA FRACTURE
Anesthesia: General | Laterality: Left

## 2017-06-06 MED ORDER — PANTOPRAZOLE SODIUM 40 MG PO TBEC
40.0000 mg | DELAYED_RELEASE_TABLET | Freq: Every day | ORAL | Status: DC
  Administered 2017-06-07 – 2017-06-08 (×2): 40 mg via ORAL
  Filled 2017-06-06 (×2): qty 1

## 2017-06-06 MED ORDER — PHENYLEPHRINE 40 MCG/ML (10ML) SYRINGE FOR IV PUSH (FOR BLOOD PRESSURE SUPPORT)
PREFILLED_SYRINGE | INTRAVENOUS | Status: AC
  Filled 2017-06-06: qty 20

## 2017-06-06 MED ORDER — TAMSULOSIN HCL 0.4 MG PO CAPS
0.4000 mg | ORAL_CAPSULE | Freq: Every day | ORAL | Status: DC
  Administered 2017-06-06 – 2017-06-07 (×2): 0.4 mg via ORAL
  Filled 2017-06-06 (×2): qty 1

## 2017-06-06 MED ORDER — ONDANSETRON HCL 4 MG PO TABS: 4.0000 mg | ORAL_TABLET | Freq: Four times a day (QID) | ORAL | Status: DC | PRN

## 2017-06-06 MED ORDER — ACETAMINOPHEN 325 MG PO TABS
650.0000 mg | ORAL_TABLET | ORAL | Status: DC | PRN
  Administered 2017-06-06 – 2017-06-07 (×2): 650 mg via ORAL
  Filled 2017-06-06 (×2): qty 2

## 2017-06-06 MED ORDER — LIDOCAINE 2% (20 MG/ML) 5 ML SYRINGE
INTRAMUSCULAR | Status: AC
  Filled 2017-06-06: qty 10

## 2017-06-06 MED ORDER — MIDAZOLAM HCL 5 MG/5ML IJ SOLN
INTRAMUSCULAR | Status: DC | PRN
  Administered 2017-06-06: 2 mg via INTRAVENOUS

## 2017-06-06 MED ORDER — POTASSIUM CHLORIDE IN NACL 20-0.9 MEQ/L-% IV SOLN
INTRAVENOUS | Status: DC
  Administered 2017-06-06: 22:00:00 via INTRAVENOUS
  Filled 2017-06-06: qty 1000

## 2017-06-06 MED ORDER — PROPOFOL 10 MG/ML IV BOLUS
INTRAVENOUS | Status: AC
  Filled 2017-06-06: qty 20

## 2017-06-06 MED ORDER — FENTANYL CITRATE (PF) 250 MCG/5ML IJ SOLN
INTRAMUSCULAR | Status: AC
  Filled 2017-06-06: qty 5

## 2017-06-06 MED ORDER — DIPHENHYDRAMINE HCL 50 MG/ML IJ SOLN
INTRAMUSCULAR | Status: DC | PRN
  Administered 2017-06-06: 12.5 mg via INTRAVENOUS

## 2017-06-06 MED ORDER — ROCURONIUM 10MG/ML (10ML) SYRINGE FOR MEDFUSION PUMP - OPTIME
INTRAVENOUS | Status: DC | PRN
  Administered 2017-06-06: 20 mg via INTRAVENOUS
  Administered 2017-06-06: 40 mg via INTRAVENOUS

## 2017-06-06 MED ORDER — EPHEDRINE SULFATE 50 MG/ML IJ SOLN
INTRAMUSCULAR | Status: DC | PRN
  Administered 2017-06-06 (×4): 10 mg via INTRAVENOUS

## 2017-06-06 MED ORDER — VILAZODONE HCL 40 MG PO TABS
40.0000 mg | ORAL_TABLET | ORAL | Status: DC
  Administered 2017-06-07 – 2017-06-08 (×2): 40 mg via ORAL
  Filled 2017-06-06 (×2): qty 1

## 2017-06-06 MED ORDER — ONDANSETRON HCL 4 MG/2ML IJ SOLN
INTRAMUSCULAR | Status: AC
  Filled 2017-06-06: qty 2

## 2017-06-06 MED ORDER — ACETAMINOPHEN 650 MG RE SUPP: 650.0000 mg | RECTAL | Status: DC | PRN

## 2017-06-06 MED ORDER — PROMETHAZINE HCL 25 MG/ML IJ SOLN: 6.2500 mg | INTRAMUSCULAR | Status: DC | PRN

## 2017-06-06 MED ORDER — GLYCOPYRROLATE 0.2 MG/ML IJ SOLN
INTRAMUSCULAR | Status: DC | PRN
  Administered 2017-06-06: 0.2 mg via INTRAVENOUS

## 2017-06-06 MED ORDER — HYDROMORPHONE HCL 1 MG/ML IJ SOLN
INTRAMUSCULAR | Status: AC
  Filled 2017-06-06: qty 2

## 2017-06-06 MED ORDER — CLINDAMYCIN PHOSPHATE 600 MG/50ML IV SOLN
600.0000 mg | Freq: Four times a day (QID) | INTRAVENOUS | Status: AC
  Administered 2017-06-06 – 2017-06-07 (×3): 600 mg via INTRAVENOUS
  Filled 2017-06-06 (×3): qty 50

## 2017-06-06 MED ORDER — BUPROPION HCL ER (XL) 150 MG PO TB24
300.0000 mg | ORAL_TABLET | Freq: Every day | ORAL | Status: DC
  Administered 2017-06-07 – 2017-06-08 (×2): 300 mg via ORAL
  Filled 2017-06-06 (×2): qty 2

## 2017-06-06 MED ORDER — ADULT MULTIVITAMIN W/MINERALS CH
1.0000 | ORAL_TABLET | Freq: Every day | ORAL | Status: DC
  Administered 2017-06-07 – 2017-06-08 (×2): 1 via ORAL
  Filled 2017-06-06 (×2): qty 1

## 2017-06-06 MED ORDER — PHENYLEPHRINE HCL 10 MG/ML IJ SOLN
INTRAMUSCULAR | Status: DC | PRN
  Administered 2017-06-06 (×2): 80 ug via INTRAVENOUS
  Administered 2017-06-06: 120 ug via INTRAVENOUS

## 2017-06-06 MED ORDER — LIDOCAINE HCL (CARDIAC) 20 MG/ML IV SOLN
INTRAVENOUS | Status: DC | PRN
  Administered 2017-06-06: 100 mg via INTRATRACHEAL

## 2017-06-06 MED ORDER — CHLORHEXIDINE GLUCONATE 4 % EX LIQD: 60.0000 mL | Freq: Once | CUTANEOUS | Status: DC

## 2017-06-06 MED ORDER — CEFAZOLIN SODIUM-DEXTROSE 2-4 GM/100ML-% IV SOLN
2.0000 g | Freq: Once | INTRAVENOUS | Status: AC
Start: 2017-06-06 — End: 2017-06-06
  Administered 2017-06-06: 2 g via INTRAVENOUS
  Filled 2017-06-06: qty 100

## 2017-06-06 MED ORDER — METOCLOPRAMIDE HCL 5 MG PO TABS: 5.0000 mg | ORAL_TABLET | Freq: Three times a day (TID) | ORAL | Status: DC | PRN

## 2017-06-06 MED ORDER — SCOPOLAMINE 1 MG/3DAYS TD PT72
MEDICATED_PATCH | TRANSDERMAL | Status: AC
  Filled 2017-06-06: qty 1

## 2017-06-06 MED ORDER — EPHEDRINE 5 MG/ML INJ
INTRAVENOUS | Status: AC
  Filled 2017-06-06: qty 10

## 2017-06-06 MED ORDER — ONDANSETRON HCL 4 MG/2ML IJ SOLN
4.0000 mg | Freq: Four times a day (QID) | INTRAMUSCULAR | Status: DC | PRN
Start: 2017-06-06 — End: 2017-06-08

## 2017-06-06 MED ORDER — DOCUSATE SODIUM 100 MG PO CAPS
100.0000 mg | ORAL_CAPSULE | Freq: Two times a day (BID) | ORAL | Status: DC
  Administered 2017-06-06 – 2017-06-08 (×4): 100 mg via ORAL
  Filled 2017-06-06 (×4): qty 1

## 2017-06-06 MED ORDER — ROCURONIUM BROMIDE 10 MG/ML (PF) SYRINGE
PREFILLED_SYRINGE | INTRAVENOUS | Status: AC
  Filled 2017-06-06: qty 15

## 2017-06-06 MED ORDER — BETHANECHOL CHLORIDE 5 MG PO TABS
5.0000 mg | ORAL_TABLET | Freq: Three times a day (TID) | ORAL | Status: DC
  Administered 2017-06-06 – 2017-06-08 (×5): 5 mg via ORAL
  Filled 2017-06-06 (×7): qty 1

## 2017-06-06 MED ORDER — SCOPOLAMINE 1 MG/3DAYS TD PT72
MEDICATED_PATCH | TRANSDERMAL | Status: DC | PRN
  Administered 2017-06-06: 1 via TRANSDERMAL

## 2017-06-06 MED ORDER — MIDAZOLAM HCL 2 MG/2ML IJ SOLN: 0.5000 mg | Freq: Once | INTRAMUSCULAR | Status: DC | PRN

## 2017-06-06 MED ORDER — HYDROMORPHONE HCL 1 MG/ML IJ SOLN
0.2500 mg | INTRAMUSCULAR | Status: DC | PRN
  Administered 2017-06-06 (×4): 0.5 mg via INTRAVENOUS

## 2017-06-06 MED ORDER — SUGAMMADEX SODIUM 200 MG/2ML IV SOLN
INTRAVENOUS | Status: DC | PRN
  Administered 2017-06-06: 200 mg via INTRAVENOUS

## 2017-06-06 MED ORDER — METHOCARBAMOL 500 MG PO TABS
500.0000 mg | ORAL_TABLET | Freq: Three times a day (TID) | ORAL | Status: DC
  Administered 2017-06-06 – 2017-06-08 (×6): 500 mg via ORAL
  Filled 2017-06-06 (×6): qty 1

## 2017-06-06 MED ORDER — ZOLPIDEM TARTRATE 5 MG PO TABS: 5.0000 mg | ORAL_TABLET | Freq: Every day | ORAL | 0 refills | Status: DC

## 2017-06-06 MED ORDER — FENTANYL CITRATE (PF) 250 MCG/5ML IJ SOLN
INTRAMUSCULAR | Status: DC | PRN
  Administered 2017-06-06: 100 ug via INTRAVENOUS
  Administered 2017-06-06: 50 ug via INTRAVENOUS
  Administered 2017-06-06 (×2): 100 ug via INTRAVENOUS
  Administered 2017-06-06: 50 ug via INTRAVENOUS
  Administered 2017-06-06: 100 ug via INTRAVENOUS

## 2017-06-06 MED ORDER — DEXAMETHASONE SODIUM PHOSPHATE 10 MG/ML IJ SOLN
INTRAMUSCULAR | Status: AC
  Filled 2017-06-06: qty 1

## 2017-06-06 MED ORDER — 0.9 % SODIUM CHLORIDE (POUR BTL) OPTIME
TOPICAL | Status: DC | PRN
  Administered 2017-06-06: 1000 mL

## 2017-06-06 MED ORDER — DEXAMETHASONE SODIUM PHOSPHATE 10 MG/ML IJ SOLN
INTRAMUSCULAR | Status: DC | PRN
  Administered 2017-06-06: 10 mg via INTRAVENOUS

## 2017-06-06 MED ORDER — PROPOFOL 10 MG/ML IV BOLUS
INTRAVENOUS | Status: DC | PRN
  Administered 2017-06-06: 170 mg via INTRAVENOUS

## 2017-06-06 MED ORDER — BREXPIPRAZOLE 2 MG PO TABS
2.0000 mg | ORAL_TABLET | Freq: Every day | ORAL | Status: DC
  Administered 2017-06-06 – 2017-06-07 (×2): 2 mg via ORAL
  Filled 2017-06-06 (×3): qty 1

## 2017-06-06 MED ORDER — LACTATED RINGERS IV SOLN
INTRAVENOUS | Status: DC
  Administered 2017-06-06: 15:00:00 via INTRAVENOUS
  Administered 2017-06-06: 75 mL/h via INTRAVENOUS

## 2017-06-06 MED ORDER — ONDANSETRON HCL 4 MG/2ML IJ SOLN
INTRAMUSCULAR | Status: DC | PRN
  Administered 2017-06-06: 4 mg via INTRAVENOUS

## 2017-06-06 MED ORDER — SUGAMMADEX SODIUM 200 MG/2ML IV SOLN
INTRAVENOUS | Status: AC
  Filled 2017-06-06: qty 2

## 2017-06-06 MED ORDER — METOCLOPRAMIDE HCL 5 MG/ML IJ SOLN: 5.0000 mg | Freq: Three times a day (TID) | INTRAMUSCULAR | Status: DC | PRN

## 2017-06-06 MED ORDER — OXYCODONE HCL 5 MG PO TABS
10.0000 mg | ORAL_TABLET | ORAL | Status: DC | PRN
  Administered 2017-06-06 – 2017-06-08 (×9): 10 mg via ORAL
  Filled 2017-06-06 (×11): qty 2

## 2017-06-06 MED ORDER — MEPERIDINE HCL 25 MG/ML IJ SOLN: 6.2500 mg | INTRAMUSCULAR | Status: DC | PRN

## 2017-06-06 MED ORDER — POLYETHYLENE GLYCOL 3350 17 G PO PACK
17.0000 g | PACK | Freq: Every day | ORAL | Status: DC
  Filled 2017-06-06 (×2): qty 1

## 2017-06-06 MED ORDER — GENTAMICIN IN SALINE 1.6-0.9 MG/ML-% IV SOLN
80.0000 mg | Freq: Once | INTRAVENOUS | Status: AC
Start: 2017-06-06 — End: 2017-06-06
  Administered 2017-06-06: 80 mg via INTRAVENOUS
  Filled 2017-06-06: qty 50

## 2017-06-06 MED ORDER — MORPHINE SULFATE (PF) 4 MG/ML IV SOLN
1.0000 mg | INTRAVENOUS | Status: DC | PRN
  Administered 2017-06-07 – 2017-06-08 (×5): 2 mg via INTRAVENOUS
  Filled 2017-06-06 (×6): qty 1

## 2017-06-06 MED ORDER — MIDAZOLAM HCL 2 MG/2ML IJ SOLN
INTRAMUSCULAR | Status: AC
  Filled 2017-06-06: qty 2

## 2017-06-06 SURGICAL SUPPLY — 78 items
ASMB TUBE 520 STRL RMR IRR (MISCELLANEOUS) ×1
BANDAGE ACE 4X5 VEL STRL LF (GAUZE/BANDAGES/DRESSINGS) ×2 IMPLANT
BANDAGE ACE 6X5 VEL STRL LF (GAUZE/BANDAGES/DRESSINGS) ×2 IMPLANT
BANDAGE ELASTIC 4 VELCRO ST LF (GAUZE/BANDAGES/DRESSINGS) ×1 IMPLANT
BANDAGE ELASTIC 6 VELCRO ST LF (GAUZE/BANDAGES/DRESSINGS) ×1 IMPLANT
BANDAGE ESMARK 6X9 LF (GAUZE/BANDAGES/DRESSINGS) ×1 IMPLANT
BLADE CLIPPER SURG (BLADE) ×1 IMPLANT
BNDG CMPR 9X6 STRL LF SNTH (GAUZE/BANDAGES/DRESSINGS) ×1
BNDG COHESIVE 4X5 TAN STRL (GAUZE/BANDAGES/DRESSINGS) ×1 IMPLANT
BNDG ESMARK 6X9 LF (GAUZE/BANDAGES/DRESSINGS) ×2
BNDG GAUZE ELAST 4 BULKY (GAUZE/BANDAGES/DRESSINGS) ×2 IMPLANT
BONE CANC CHIPS 20CC PCAN1/4 (Bone Implant) ×2 IMPLANT
BRUSH SCRUB SURG 4.25 DISP (MISCELLANEOUS) ×4 IMPLANT
CHIPS CANC BONE 20CC PCAN1/4 (Bone Implant) ×1 IMPLANT
CLIP LOCKING FOR RIA (CLIP) ×1 IMPLANT
COVER MAYO STAND STRL (DRAPES) ×2 IMPLANT
DRAPE C-ARM 42X72 X-RAY (DRAPES) ×2 IMPLANT
DRAPE C-ARMOR (DRAPES) ×2 IMPLANT
DRAPE HALF SHEET 40X57 (DRAPES) ×4 IMPLANT
DRAPE INCISE IOBAN 66X45 STRL (DRAPES) ×2 IMPLANT
DRAPE U-SHAPE 47X51 STRL (DRAPES) ×2 IMPLANT
DRIVE SHAFT SEAL STERILE ×1 IMPLANT
DRSG ADAPTIC 3X8 NADH LF (GAUZE/BANDAGES/DRESSINGS) ×2 IMPLANT
DRSG MEPILEX BORDER 4X4 (GAUZE/BANDAGES/DRESSINGS) ×1 IMPLANT
DRSG MEPITEL 4X7.2 (GAUZE/BANDAGES/DRESSINGS) ×1 IMPLANT
DRSG PAD ABDOMINAL 8X10 ST (GAUZE/BANDAGES/DRESSINGS) ×8 IMPLANT
ELECT REM PT RETURN 9FT ADLT (ELECTROSURGICAL) ×2
ELECTRODE REM PT RTRN 9FT ADLT (ELECTROSURGICAL) ×1 IMPLANT
GAUZE SPONGE 4X4 12PLY STRL (GAUZE/BANDAGES/DRESSINGS) ×2 IMPLANT
GAUZE SPONGE 4X4 12PLY STRL LF (GAUZE/BANDAGES/DRESSINGS) ×1 IMPLANT
GLOVE BIO SURGEON STRL SZ7.5 (GLOVE) ×2 IMPLANT
GLOVE BIO SURGEON STRL SZ8 (GLOVE) ×2 IMPLANT
GLOVE BIOGEL PI IND STRL 7.5 (GLOVE) ×1 IMPLANT
GLOVE BIOGEL PI IND STRL 8 (GLOVE) ×1 IMPLANT
GLOVE BIOGEL PI INDICATOR 7.5 (GLOVE) ×1
GLOVE BIOGEL PI INDICATOR 8 (GLOVE) ×1
GLOVE PROGUARD SZ 7 1/2 (GLOVE) ×2 IMPLANT
GOWN STRL REUS W/ TWL LRG LVL3 (GOWN DISPOSABLE) ×2 IMPLANT
GOWN STRL REUS W/ TWL XL LVL3 (GOWN DISPOSABLE) ×1 IMPLANT
GOWN STRL REUS W/TWL LRG LVL3 (GOWN DISPOSABLE) ×4
GOWN STRL REUS W/TWL XL LVL3 (GOWN DISPOSABLE) ×2
GRAFT BNE CANC CHIPS 1-8 20CC (Bone Implant) IMPLANT
GRAFT FILTER FOR RIA 520 LGTH (MISCELLANEOUS) ×1 IMPLANT
GUIDEWIRE 3.2X400 (WIRE) ×1 IMPLANT
KIT BASIN OR (CUSTOM PROCEDURE TRAY) ×2 IMPLANT
KIT ROOM TURNOVER OR (KITS) ×2 IMPLANT
MANIFOLD NEPTUNE II (INSTRUMENTS) ×2 IMPLANT
NS IRRIG 1000ML POUR BTL (IV SOLUTION) ×2 IMPLANT
PACK ORTHO EXTREMITY (CUSTOM PROCEDURE TRAY) ×2 IMPLANT
PAD ABD 8X10 STRL (GAUZE/BANDAGES/DRESSINGS) ×2 IMPLANT
PAD ARMBOARD 7.5X6 YLW CONV (MISCELLANEOUS) ×4 IMPLANT
PAD CAST 4YDX4 CTTN HI CHSV (CAST SUPPLIES) ×1 IMPLANT
PADDING CAST COTTON 4X4 STRL (CAST SUPPLIES) ×2
PADDING CAST COTTON 6X4 STRL (CAST SUPPLIES) ×2 IMPLANT
REAMER HEAD 12.5MM (MISCELLANEOUS) ×1 IMPLANT
REAMER HEAD 13.5MM (BURR) ×1 IMPLANT
REAMER ROD DEEP FLUTE 2.5X950 (INSTRUMENTS) ×1 IMPLANT
SET CYSTO W/LG BORE CLAMP LF (SET/KITS/TRAYS/PACK) ×1 IMPLANT
SPONGE LAP 18X18 X RAY DECT (DISPOSABLE) ×1 IMPLANT
STAPLER VISISTAT 35W (STAPLE) ×2 IMPLANT
STOCKINETTE IMPERVIOUS LG (DRAPES) IMPLANT
SUCTION FRAZIER HANDLE 10FR (MISCELLANEOUS) ×1
SUCTION TUBE FRAZIER 10FR DISP (MISCELLANEOUS) ×1 IMPLANT
SUT ETHILON 3 0 PS 1 (SUTURE) ×1 IMPLANT
SUT VIC AB 0 CT1 27 (SUTURE) ×4
SUT VIC AB 0 CT1 27XBRD ANBCTR (SUTURE) ×1 IMPLANT
SUT VIC AB 1 CT1 27 (SUTURE) ×2
SUT VIC AB 1 CT1 27XBRD ANBCTR (SUTURE) ×1 IMPLANT
SUT VIC AB 2-0 CT1 27 (SUTURE) ×6
SUT VIC AB 2-0 CT1 TAPERPNT 27 (SUTURE) ×2 IMPLANT
TOWEL OR 17X24 6PK STRL BLUE (TOWEL DISPOSABLE) ×2 IMPLANT
TOWEL OR 17X26 10 PK STRL BLUE (TOWEL DISPOSABLE) ×4 IMPLANT
TRAY FOLEY W/METER SILVER 16FR (SET/KITS/TRAYS/PACK) IMPLANT
TUBE ASSEMBLY RIA STERILE (MISCELLANEOUS) ×1 IMPLANT
TUBE CONNECTING 12X1/4 (SUCTIONS) ×2 IMPLANT
TUBE CONNECTING 20X1/4 (TUBING) ×1 IMPLANT
WATER STERILE IRR 1000ML POUR (IV SOLUTION) ×4 IMPLANT
YANKAUER SUCT BULB TIP NO VENT (SUCTIONS) ×2 IMPLANT

## 2017-06-06 NOTE — Progress Notes (Signed)
Entered in error

## 2017-06-06 NOTE — Progress Notes (Signed)
Patient has small area ? Skin tear, sacral dressing applied.

## 2017-06-06 NOTE — H&P (Signed)
Orthopaedic Trauma Service (OTS) Consult   Patient ID: Deborah [name] MRN: [id number] DOB/AGE: [birth year] 61 y.o.    HPI: Deborah [name] is an 61 y.o. white  female who was involved in an accident 3 months ago.  She has had numerous surgical procedures.  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  She was then taken to [facility] for flap of her left proximal tibia.  This was done over an antibiotic spacer where she had a significant bone defect from her original injury.  Patient now presents for removal of spacer and autografting of the bone defect with reamed intramedullary aspirate.  Cultures from her last hospitalization notable for Enterobacter  Patient had an apparent reaction to vancomycin while at the nursing home.  This occurred about 4-1/2 weeks after initiation of treatment.  Unclear if it was really was a reaction to vancomycin or other medications.  Vancomycin has been stopped for about a week now.  PICC line pulled by the nursing home facility  Past Medical History:  Diagnosis Date  . Anemia    iron deficieny  . Depression   . GERD (gastroesophageal reflux disease)   . Insomnia   . Low back pain   . Muscle weakness   . Neck pain   . Osteomyelitis of right tibia ([facility])   . Restless leg syndrome   . Traumatic open wound of left lower leg 04/27/2017    Past Surgical History:  Procedure Laterality Date  . APPLICATION OF WOUND VAC Right 04/25/2017    Family History  Problem Relation Age of Onset  . Leukemia Father   . Alcohol abuse Sister     Social History:  reports that she quit smoking about 3 months ago. Her smoking use included cigarettes. She smoked 0.50 packs per day. she has never used smokeless tobacco. She reports that she does not drink alcohol or use drugs.  Allergies:  Allergies  Allergen Reactions  . Vancomycin Other (See Comments)    Developed Red Man  [place]    Medications:   Current Meds  Medication Sig  . bethanechol (URECHOLINE) 5 MG tablet Take 1 tablet (5 mg total) by mouth 3 (three) times daily.  . Brexpiprazole (REXULTI) 2 MG TABS Take 2 mg by mouth at bedtime.  Marland Kitchen buPROPion (WELLBUTRIN XL) 300 MG 24 hr tablet Take 1 tablet (300 mg total) by mouth daily.  . diphenhydrAMINE (BENADRYL) 25 mg capsule Take 25 mg See admin instructions by mouth. Take 25 mg by mouth twice daily until 11/12. Take 25 mg by mouth every 6 hours as needed for hives  . docusate sodium (COLACE) 100 MG capsule Take 1 capsule (100 mg total) by mouth 2 (two) times daily.  . ferrous gluconate (FERGON) 324 MG tablet Take 1 tablet (324 mg total) by mouth 2 (two) times daily with a meal.  . methocarbamol (ROBAXIN) 500 MG tablet Take 1 tablet (500 mg total) by mouth every 8 (eight) hours.  . Multiple Vitamin (MULTIVITAMIN WITH MINERALS) TABS tablet Take 1 tablet by mouth daily.  . Oxycodone HCl 10 MG TABS Give 1 tablet by mouth every 4 hours.  Hold for sedation.  . pantoprazole (PROTONIX) 40 MG tablet Take 1 tablet (40 mg total) by mouth daily.  . polyethylene glycol (MIRALAX / GLYCOLAX) packet Take 17 g by mouth daily.  . ranitidine (ZANTAC) 300 MG tablet Take 300 mg 2 (two) times daily by  mouth.  . tamsulosin (FLOMAX) 0.4 MG CAPS capsule Take 1 capsule (0.4 mg total) by mouth daily. (Patient taking differently: Take 0.4 mg at bedtime by mouth. )  . Vilazodone HCl (VIIBRYD) 40 MG TABS Take 40 mg by mouth every morning.  . zolpidem (AMBIEN) 5 MG tablet Take 1 tablet (5 mg total) by mouth at bedtime.     Labs pending  Review of Systems  Constitutional: Negative for chills and fever.  Respiratory: Negative for shortness of breath.   Cardiovascular: Negative for chest pain and palpitations.  Neurological: Negative for tingling and sensory change.   Vitals on arrival to short stay  Physical Exam  Constitutional: She is cooperative.  White female appears older  than stated age, no acute distress  Cardiovascular: Normal rate and regular rhythm.  Pulmonary/Chest: Effort normal. No respiratory distress.  Abdominal:  Soft, NT, + BS  Musculoskeletal:  Left Lower Extremity  Flap is stable Distal motor and sensory functions are intact Extremity is warm Compartments are soft + DP pulse   Neurological: She is alert.     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  Will autograft bone void with reamed intramedullary aspirate from left femur  NWB L leg x 4 weeks post op  Unrestricted ROM L knee  Admit post op for pain control and therapies  - R proximal tibia fracture, osteomyelitis  Completed treatment   Continue to monitor   WBAT    - Pain management:  Titrate accordingly   - ABL anemia/Hemodynamics  Monitor  - Medical issues   Home meds  - ID:   Periop abx   - Dispo:  OR for repair L tibial plateau nonunion    Mearl LatinKeith W. Brick Ketcher, PA-C [facility] [phone](P) [phone] (C) [phone] (O) 06/06/2017, 8:30 AM

## 2017-06-06 NOTE — Telephone Encounter (Signed)
RX faxed to [facility] @ [phone], phone number [phone] 

## 2017-06-06 NOTE — Anesthesia Procedure Notes (Signed)
Performed by: Arlicia Paquette S, CRNA       

## 2017-06-06 NOTE — Transfer of Care (Signed)
Immediate Anesthesia Transfer of Care Note  Patient: Deborah [name]  Procedure(s) Performed: AUTOGRAFT HARVEST LEFT FEMUR, PLACEMENT OF BONE GRAFT LEFT TIBIA FRACTURE (Left )  Patient Location: PACU  Anesthesia Type:General  Level of Consciousness: awake, alert  and oriented  Airway & Oxygen Therapy: Patient Spontanous Breathing and Patient connected to nasal cannula oxygen  Post-op Assessment: Report given to RN and Post -op Vital signs reviewed and stable  Post vital signs: Reviewed and stable  Last Vitals:  Vitals:   06/06/17 1033 06/06/17 1629  BP: 110/78   Pulse: 66   Resp: 18   Temp: 36.6 C (!) 36.3 C  SpO2: 98%     Last Pain:  Vitals:   06/06/17 1047  TempSrc:   PainSc: 8       Patients Stated Pain Goal: 3 (06/06/17 1047)  Complications: No apparent anesthesia complications

## 2017-06-06 NOTE — Anesthesia Procedure Notes (Signed)
Procedure Name: Intubation Date/Time: 06/06/2017 1:24 PM Performed by: Mariea Clonts, CRNA Pre-anesthesia Checklist: Patient identified, Emergency Drugs available, Suction available, Patient being monitored and Timeout performed Patient Re-evaluated:Patient Re-evaluated prior to induction Oxygen Delivery Method: Circle system utilized Preoxygenation: Pre-oxygenation with 100% oxygen Induction Type: IV induction and Cricoid Pressure applied Ventilation: Mask ventilation without difficulty Laryngoscope Size: Mac and 3 Grade View: Grade II Tube type: Oral Tube size: 7.0 mm Airway Equipment and Method: Stylet Placement Confirmation: ETT inserted through vocal cords under direct vision,  positive ETCO2 and breath sounds checked- equal and bilateral Secured at: 22 cm Tube secured with: Tape Dental Injury: Teeth and Oropharynx as per pre-operative assessment

## 2017-06-06 NOTE — Anesthesia Preprocedure Evaluation (Addendum)
Anesthesia Evaluation  Patient identified by MRN, date of birth, ID band Patient awake    Reviewed: Allergy & Precautions, NPO status , Patient's Chart, lab work & pertinent test results  History of Anesthesia Complications Negative for: history of anesthetic complications  Airway Mallampati: II  TM Distance: >3 FB Neck ROM: Full    Dental  (+) Loose, Chipped, Dental Advisory Given   Pulmonary COPD, former smoker,    breath sounds clear to auscultation       Cardiovascular (-) anginanegative cardio ROS   Rhythm:Regular Rate:Normal     Neuro/Psych Depression Chronic pain: narcotics    GI/Hepatic Neg liver ROS, GERD  Medicated and Controlled,  Endo/Other  negative endocrine ROS  Renal/GU negative Renal ROS     Musculoskeletal   Abdominal   Peds  Hematology negative hematology ROS (+)   Anesthesia Other Findings   Reproductive/Obstetrics                            Anesthesia Physical Anesthesia Plan  ASA: II  Anesthesia Plan: General   Post-op Pain Management:    Induction: Intravenous  PONV Risk Score and Plan: 4 or greater and Ondansetron, Dexamethasone, Scopolamine patch - Pre-op and Midazolam  Airway Management Planned: Oral ETT  Additional Equipment:   Intra-op Plan:   Post-operative Plan: Extubation in OR  Informed Consent: I have reviewed the patients History and Physical, chart, labs and discussed the procedure including the risks, benefits and alternatives for the proposed anesthesia with the patient or authorized representative who has indicated his/her understanding and acceptance.   Dental advisory given  Plan Discussed with: CRNA and Surgeon  Anesthesia Plan Comments: (Plan routine monitors, GETA)        Anesthesia Quick Evaluation

## 2017-06-06 NOTE — Anesthesia Postprocedure Evaluation (Signed)
Anesthesia Post Note  Patient: Darrick GrinderBernadette M Jarecki  Procedure(s) Performed: AUTOGRAFT HARVEST LEFT FEMUR, PLACEMENT OF BONE GRAFT LEFT TIBIA FRACTURE (Left )     Patient location during evaluation: PACU Anesthesia Type: General Level of consciousness: sedated, patient cooperative and oriented Pain management: pain level controlled Vital Signs Assessment: post-procedure vital signs reviewed and stable Respiratory status: spontaneous breathing, nonlabored ventilation, respiratory function stable and patient connected to nasal cannula oxygen Cardiovascular status: blood pressure returned to baseline and stable Postop Assessment: no apparent nausea or vomiting Anesthetic complications: no    Last Vitals:  Vitals:   06/06/17 1700 06/06/17 1715  BP: (!) 139/96 114/89  Pulse: (!) 104 (!) 106  Resp: (!) 39 16  Temp:    SpO2: 100% 100%    Last Pain:  Vitals:   06/06/17 1715  TempSrc:   PainSc: Asleep                 Latravis Grine,E. Treyson Axel

## 2017-06-07 ENCOUNTER — Encounter (HOSPITAL_COMMUNITY): Payer: Self-pay | Admitting: General Practice

## 2017-06-07 ENCOUNTER — Other Ambulatory Visit: Payer: Self-pay

## 2017-06-07 LAB — CBC
HEMATOCRIT: 34.3 % — AB (ref 36.0–46.0)
HEMOGLOBIN: 11.1 g/dL — AB (ref 12.0–15.0)
MCH: 29.3 pg (ref 26.0–34.0)
MCHC: 32.4 g/dL (ref 30.0–36.0)
MCV: 90.5 fL (ref 78.0–100.0)
Platelets: 209 10*3/uL (ref 150–400)
RBC: 3.79 MIL/uL — ABNORMAL LOW (ref 3.87–5.11)
RDW: 13.8 % (ref 11.5–15.5)
WBC: 9.6 10*3/uL (ref 4.0–10.5)

## 2017-06-07 LAB — BASIC METABOLIC PANEL
Anion gap: 8 (ref 5–15)
BUN: 12 mg/dL (ref 6–20)
CHLORIDE: 103 mmol/L (ref 101–111)
CO2: 23 mmol/L (ref 22–32)
Calcium: 8.7 mg/dL — ABNORMAL LOW (ref 8.9–10.3)
Creatinine, Ser: 0.73 mg/dL (ref 0.44–1.00)
GFR calc Af Amer: >60 mL/min (ref >60)
GFR calc non Af Amer: >60 mL/min (ref >60)
GLUCOSE: 129 mg/dL — AB (ref 65–99)
POTASSIUM: 4.2 mmol/L (ref 3.5–5.1)
Sodium: 134 mmol/L — ABNORMAL LOW (ref 135–145)

## 2017-06-07 MED ORDER — ASPIRIN EC 325 MG PO TBEC
325.0000 mg | DELAYED_RELEASE_TABLET | Freq: Every day | ORAL | Status: DC
  Administered 2017-06-07 – 2017-06-08 (×2): 325 mg via ORAL
  Filled 2017-06-07 (×2): qty 1

## 2017-06-07 NOTE — Progress Notes (Addendum)
Orthopaedic Trauma Service (OTS)  1 Day Post-Op Procedure(s) (LRB): AUTOGRAFT HARVEST LEFT FEMUR, PLACEMENT OF BONE GRAFT LEFT TIBIA FRACTURE (Left)  Subjective: Patient reports pain as moderate.    Objective: Current Vitals Blood pressure 126/70, pulse (!) 108, temperature 98.4 F (36.9 C), temperature source Oral, resp. rate 16, height 5\' 5"  (1.651 m), weight 57.1 kg (125 lb 12.8 oz), SpO2 96 %. Vital signs in last 24 hours: Temp:  [97.3 F (36.3 C)-98.4 F (36.9 C)] 98.4 F (36.9 C) (11/07 19140608) Pulse Rate:  [62-117] 108 (11/07 0608) Resp:  [12-39] 16 (11/07 0608) BP: (108-151)/(68-96) 126/70 (11/07 0608) SpO2:  [96 %-100 %] 96 % (11/07 78290608) Weight:  [57.1 kg (125 lb 12.8 oz)] 57.1 kg (125 lb 12.8 oz) (11/06 1033)  Intake/Output from previous day: 11/06 0701 - 11/07 0700 In: 1600 [I.V.:1600] Out: 100 [Blood:100]  LABS Recent Labs    06/06/17 1024 06/07/17 0057  HGB 12.8 11.1*   Recent Labs    06/06/17 1024 06/07/17 0057  WBC 6.2 9.6  RBC 4.42 3.79*  HCT 40.6 34.3*  PLT 226 209   Recent Labs    06/06/17 1024 06/07/17 0057  NA 137 134*  K 4.3 4.2  CL 103 103  CO2 26 23  BUN 14 12  CREATININE 0.70 0.73  GLUCOSE 91 129*  CALCIUM 9.5 8.7*   Recent Labs    06/06/17 1024  INR 0.97     Physical Exam LLE Dressing intact, clean, dry  Edema/ swelling controlled  Sens: DPN, SPN, TN intact  Motor: EHL, FHL, and lessor toe ext and flex all intact grossly  Brisk cap refill, warm to touch  Assessment/Plan: 1 Day Post-Op Procedure(s) (LRB): AUTOGRAFT HARVEST LEFT FEMUR, PLACEMENT OF BONE GRAFT LEFT TIBIA FRACTURE (Left) 1. PT/OT TDWB on LLE 2. DVT proph mechanical 3. D/c tomorrow post PT  Myrene GalasMichael Tiaunna Buford, MD [facility] [phone] (p)

## 2017-06-07 NOTE — Progress Notes (Signed)
PT Cancellation Note  Patient Details Name: Deborah [name] MRN: [id number] DOB: 02-19-1956   Cancelled Treatment:     Attempted to evaluate pt this afternoon. Upon entry, pt hysterical in tears due to pain in bed unable to eat. Nurse notified and pt was given pain medication. Pt requests I visit at a later time when pain subsides, will attempt later today.   Etta GrandchildSean Kearia Yin, PT, DPT Acute Rehab Services Pager: [phone]   Etta GrandchildSean  Lucilla Petrenko 06/07/2017, 2:10 PM
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
## 2017-06-08 ENCOUNTER — Encounter (HOSPITAL_COMMUNITY): Payer: Self-pay | Admitting: Orthopedic Surgery

## 2017-06-08 DIAGNOSIS — K219 Gastro-esophageal reflux disease without esophagitis: Secondary | ICD-10-CM | POA: Insufficient documentation

## 2017-06-08 LAB — BASIC METABOLIC PANEL
Anion gap: 6 (ref 5–15)
BUN: 8 mg/dL (ref 6–20)
CO2: 27 mmol/L (ref 22–32)
CREATININE: 0.71 mg/dL (ref 0.44–1.00)
Calcium: 8.6 mg/dL — ABNORMAL LOW (ref 8.9–10.3)
Chloride: 104 mmol/L (ref 101–111)
GFR calc Af Amer: >60 mL/min (ref >60)
GLUCOSE: 111 mg/dL — AB (ref 65–99)
POTASSIUM: 4.6 mmol/L (ref 3.5–5.1)
Sodium: 137 mmol/L (ref 135–145)

## 2017-06-08 LAB — MRSA PCR SCREENING: MRSA by PCR: POSITIVE — AB

## 2017-06-08 MED ORDER — CHLORHEXIDINE GLUCONATE CLOTH 2 % EX PADS
6.0000 | MEDICATED_PAD | Freq: Every day | CUTANEOUS | Status: DC
  Administered 2017-06-08: 6 via TOPICAL

## 2017-06-08 MED ORDER — OXYCODONE HCL 10 MG PO TABS: 10.0000 mg | ORAL_TABLET | ORAL | 0 refills | Status: DC | PRN

## 2017-06-08 MED ORDER — METHOCARBAMOL 500 MG PO TABS: 500.0000 mg | ORAL_TABLET | Freq: Four times a day (QID) | ORAL | 0 refills | Status: DC | PRN

## 2017-06-08 MED ORDER — ACETAMINOPHEN 500 MG PO TABS: 500.0000 mg | ORAL_TABLET | Freq: Four times a day (QID) | ORAL | 0 refills | Status: DC | PRN

## 2017-06-08 MED ORDER — MUPIROCIN 2 % EX OINT: 1.0000 "application " | TOPICAL_OINTMENT | Freq: Two times a day (BID) | CUTANEOUS | 0 refills | Status: DC

## 2017-06-08 MED ORDER — ASPIRIN 325 MG PO TBEC: 325.0000 mg | DELAYED_RELEASE_TABLET | Freq: Every day | ORAL | 0 refills | Status: AC

## 2017-06-08 MED ORDER — MUPIROCIN 2 % EX OINT
1.0000 "application " | TOPICAL_OINTMENT | Freq: Two times a day (BID) | CUTANEOUS | Status: DC
  Administered 2017-06-08: 1 via NASAL
  Filled 2017-06-08: qty 22

## 2017-06-08 NOTE — Clinical Social Work Placement (Signed)
   CLINICAL SOCIAL WORK PLACEMENT  NOTE  Date:  06/08/2017  Patient Details  Name: Deborah [name] MRN: [id number] Date of Birth: 1956-01-02  Clinical Social Work is seeking post-discharge placement for this patient at the Skilled  Nursing Facility level of care (*CSW will initial, date and re-position this form in  chart as items are completed):  Yes   Patient/family provided with [place] Clinical Social Work Department's list of facilities offering this level of care within the geographic area requested by the patient (or if unable, by the patient's family).  Yes   Patient/family informed of their freedom to choose among providers that offer the needed level of care, that participate in Medicare, Medicaid or managed care program needed by the patient, have an available bed and are willing to accept the patient.  Yes   Patient/family informed of [facility]'s ownership interest in [facility] and [facility], as well as of the fact that they are under no obligation to receive care at these facilities.  PASRR submitted to EDS on 06/08/17     PASRR number received on       Existing PASRR number confirmed on 06/08/17     FL2 transmitted to all facilities in geographic area requested by pt/family on       FL2 transmitted to all facilities within larger geographic area on       Patient informed that his/her managed care company has contracts with or will negotiate with certain facilities, including the following:            Patient/family informed of bed offers received.  Patient chooses bed at [facility]     Physician recommends and patient chooses bed at      Patient to be transferred to [facility] on 06/08/17.  Patient to be transferred to facility by PTAR     Patient family notified on 06/08/17 of transfer.  Name of family member notified:  pt responsible for self     PHYSICIAN Please prepare priority discharge summary,  including medications, Please prepare prescriptions     Additional Comment:    _______________________________________________ Tresa MoorePatricia V Ariea Rochin, LCSW 06/08/2017, 11:56 AM

## 2017-06-08 NOTE — NC FL2 (Signed)
[place] MEDICAID FL2 LEVEL OF CARE SCREENING TOOL     IDENTIFICATION  Patient Name: Deborah GrinderBernadette M Cowles Birthdate: February 10, 1956 Sex: female Admission Date (Current Location): 06/06/2017  [facility] [place]   Facility and Address:  [facility], [address], [place], [place] [address]      Provider Number: [id number]  Attending Physician Name and Address:  Myrene GalasHandy, Michael, MD  Relative Name and Phone Number:  Melissa MontaneUlna Finazzo, sister, [phone]    Current Level of Care: Hospital Recommended Level of Care: Skilled Nursing Facility Prior Approval Number:    Date Approved/Denied:   PASRR Number: [id number]  Discharge Plan: SNF    Current Diagnoses: Patient Active Problem List   Diagnosis Date Noted  . GERD (gastroesophageal reflux disease)   . Tibia fracture 06/06/2017  . 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 Traumatic open wound of left lower leg 04/27/2017  . Osteomyelitis of right tibia (HCC)   . Depression   . Osteomyelitis (HCC) 04/25/2017  . Acute blood loss anemia 04/08/2017  . GERD without esophagitis 04/08/2017  . Constipation due to opioid therapy 04/08/2017  . Anticoagulation adequate 04/08/2017  . S/P ORIF (open reduction internal fixation) fracture 03/26/2017  . Multiple fractures 03/26/2017  . Anemia 03/26/2017  . Urinary retention 03/26/2017  . Concussion with no loss of consciousness 03/26/2017  . Bilateral tibial fractures 02/22/2017  . MDD (major depressive disorder), recurrent severe, without psychosis (HCC) 01/26/2017  . INSOMNIA 01/26/2010  . ACTINIC KERATOSIS 08/28/2009    Orientation RESPIRATION BLADDER Height & Weight     Self, Time, Situation, Place  Normal Incontinent, Indwelling catheter Weight: 125 lb 12.8 oz (57.1 kg) Height:  5\' 5"  (165.1 cm)  BEHAVIORAL SYMPTOMS/MOOD NEUROLOGICAL BOWEL NUTRITION STATUS      Continent Diet(see DC pla)  AMBULATORY STATUS  COMMUNICATION OF NEEDS Skin   Extensive Assist Verbally Surgical wounds                       Personal Care Assistance Level of Assistance  Dressing, Bathing, Feeding Bathing Assistance: Maximum assistance Feeding assistance: Independent Dressing Assistance: Maximum assistance     Functional Limitations Info  Sight, Hearing, Speech Sight Info: Adequate Hearing Info: Adequate Speech Info: Adequate    SPECIAL CARE FACTORS FREQUENCY  PT (By licensed PT), OT (By licensed OT)     PT Frequency: 3x week OT Frequency: weekly            Contractures Contractures Info: Not present    Additional Factors Info  Code Status, Allergies, Psychotropic, Isolation Precautions Code Status Info: Full Code Allergies Info: VANCOMYCIN  Psychotropic Info: Wellbutrin   Isolation Precautions Info: MRSA     Current Medications (06/08/2017):  This is the current hospital active medication list Current Facility-Administered Medications  Medication Dose Route Frequency Provider Last Rate Last Dose  . 0.9 % NaCl with KCl 20 mEq/ L  infusion   Intravenous Continuous Montez Moritaaul, Keith, PA-C 10 mL/hr at 06/07/17 0913    . acetaminophen (TYLENOL) tablet 650 mg  650 mg Oral Q4H PRN Montez Moritaaul, Keith, PA-C   650 mg at 06/07/17 0505   Or  . acetaminophen (TYLENOL) suppository 650 mg  650 mg Rectal Q4H PRN Montez MoritaPaul, Keith, PA-C      . aspirin EC tablet 325 mg  325 mg Oral Daily Montez Moritaaul, Keith, PA-C   325 mg at 06/08/17 [id number]  . bethanechol (URECHOLINE) tablet 5 mg  5  mg Oral TID Montez MoritaPaul, Keith, PA-C   5 mg at 06/08/17 [id number]  . Brexpiprazole TABS 2 mg  2 mg Oral QHS Montez MoritaPaul, Keith, PA-C   2 mg at 06/07/17 2148  . buPROPion (WELLBUTRIN XL) 24 hr tablet 300 mg  300 mg Oral Daily Montez Moritaaul, Keith, PA-C   300 mg at 06/08/17 [id number]  . Chlorhexidine Gluconate Cloth 2 % PADS 6 each  6 each Topical Q0600 Myrene GalasHandy, Michael, MD   6 each at 06/08/17 0730  . docusate sodium (COLACE) capsule 100 mg  100 mg Oral BID Montez Moritaaul, Keith, PA-C   100 mg at  06/08/17 0941  . methocarbamol (ROBAXIN) tablet 500 mg  500 mg Oral Q8H Montez Moritaaul, Keith, PA-C   500 mg at 06/08/17 0507  . metoCLOPramide (REGLAN) tablet 5-10 mg  5-10 mg Oral Q8H PRN Montez MoritaPaul, Keith, PA-C       Or  . metoCLOPramide (REGLAN) injection 5-10 mg  5-10 mg Intravenous Q8H PRN Montez MoritaPaul, Keith, PA-C      . morphine 4 MG/ML injection 1-2 mg  1-2 mg Intravenous Q2H PRN Montez MoritaPaul, Keith, PA-C   2 mg at 06/07/17 2319  . multivitamin with minerals tablet 1 tablet  1 tablet Oral Daily Montez Moritaaul, Keith, PA-C   1 tablet at 06/08/17 [id number]  . mupirocin ointment (BACTROBAN) 2 % 1 application  1 application Nasal BID Myrene GalasHandy, Michael, MD   1 application at 06/08/17 1058  . ondansetron (ZOFRAN) tablet 4 mg  4 mg Oral Q6H PRN Montez MoritaPaul, Keith, PA-C       Or  . ondansetron [facility](ZOFRAN) injection 4 mg  4 mg Intravenous Q6H PRN Montez MoritaPaul, Keith, PA-C      . oxyCODONE (Oxy IR/ROXICODONE) immediate release tablet 10 mg  10 mg Oral Q4H PRN Montez MoritaPaul, Keith, PA-C   10 mg at 06/08/17 1038  . pantoprazole (PROTONIX) EC tablet 40 mg  40 mg Oral Daily Montez Moritaaul, Keith, PA-C   40 mg at 06/08/17 [id number]  . polyethylene glycol (MIRALAX / GLYCOLAX) packet 17 g  17 g Oral Daily Montez MoritaPaul, Keith, PA-C      . tamsulosin [place](FLOMAX) capsule 0.4 mg  0.4 mg Oral QHS Montez MoritaPaul, Keith, PA-C   0.4 mg at 06/07/17 2148  . Vilazodone HCl (VIIBRYD) TABS 40 mg  40 mg Oral Rona RavensBH-q7a Paul, Keith, PA-C   40 mg at 06/08/17 [id number]   Facility-Administered Medications Ordered in Other Encounters  Medication Dose Route Frequency Provider Last Rate Last Dose  . fentaNYL (SUBLIMAZE) injection   Intravenous PRN Charlynne PanderYao, David Hsienta, MD   50 mcg at 02/22/17 2139     Discharge Medications: Please see discharge summary for a list of discharge medications.  Relevant Imaging Results:  Relevant Lab Results:   Additional Information SS#:[id number]  Tresa MoorePatricia V Shizue Kaseman, LCSW
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
## 2017-06-08 NOTE — Discharge Instructions (Signed)
[facility] Discharge Instructions   General Discharge Instructions  WEIGHT BEARING STATUS:  Weightbearing as tolerated right leg Touchdown weightbearing left leg  RANGE OF MOTION/ACTIVITY:   Unrestricted and aggressive bilateral knee range of motion Unrestricted bilateral ankle and hip range of motion  Daily PT and OT for range of motion exercises as well as therapeutic exercises  Bilateral knee ROM- AROM, PROM. Prone exercises as well. No ROM restrictions.  Quad sets, SLR, LAQ, SAQ, heel slides, stretching, prone flexion and extension No pillows under bend of knee when at rest, ok to place under heel to help work on extension  Can also perform ankle Thera-Band program as well as heel cord stretching bilaterally  Okay to ride an exercise bike if available   Wound Care: Daily wound care starting on 06/11/2017,  see below.   Discharge Wound Care Instructions  Do NOT apply any ointments, solutions or lotions to pin sites or surgical wounds.  These prevent needed drainage and even though solutions like hydrogen peroxide kill bacteria, they also damage cells lining the pin sites that help fight infection.  Applying lotions or ointments can keep the wounds moist and can cause them to breakdown and open up as well. This can increase the risk for infection. When in doubt call the office.  Surgical incisions should be dressed daily.  If any drainage is noted, use one layer of adaptic, then gauze, Kerlix, and an ace wrap.  Once the incision is completely dry and without drainage, it may be left open to air out.  Showering may begin 36-48 hours later.  Cleaning gently with soap and water.  Traumatic wounds should be dressed daily as well.    One layer of adaptic, gauze, Kerlix, then ace wrap.  The adaptic can be discontinued once the draining has ceased    If you have a wet to dry dressing: wet the gauze with saline the squeeze as much saline out so the gauze is moist  (not soaking wet), place moistened gauze over wound, then place a dry gauze over the moist one, followed by Kerlix wrap, then ace wrap.   DVT/PE prophylaxis: Aspirin EC 325 mg daily for 4 weeks  Diet: as you were eating previously.  Can use over the counter stool softeners and bowel preparations, such as Miralax, to help with bowel movements.  Narcotics can be constipating.  Be sure to drink plenty of fluids  PAIN MEDICATION USE AND EXPECTATIONS  You have likely been given narcotic medications to help control your pain.  After a traumatic event that results in an fracture (broken bone) with or without surgery, it is ok to use narcotic pain medications to help control one's pain.  We understand that everyone responds to pain differently and each individual patient will be evaluated on a regular basis for the continued need for narcotic medications. Ideally, narcotic medication use should last no more than 6-8 weeks (coinciding with fracture healing).   As a patient it is your responsibility as well to monitor narcotic medication use and report the amount and frequency you use these medications when you come to your office visit.   We would also advise that if you are using narcotic medications, you should take a dose prior to therapy to maximize you participation.  IF YOU ARE ON NARCOTIC MEDICATIONS IT IS NOT PERMISSIBLE TO OPERATE A MOTOR VEHICLE (MOTORCYCLE/CAR/TRUCK/MOPED) OR HEAVY MACHINERY DO NOT MIX NARCOTICS WITH OTHER CNS (CENTRAL NERVOUS SYSTEM) DEPRESSANTS SUCH AS ALCOHOL   STOP  SMOKING OR USING NICOTINE PRODUCTS!!!!  As discussed nicotine severely impairs your body's ability to heal surgical and traumatic wounds but also impairs bone healing.  Wounds and bone heal by forming microscopic blood vessels (angiogenesis) and nicotine is a vasoconstrictor (essentially, shrinks blood vessels).  Therefore, if vasoconstriction occurs to these microscopic blood vessels they essentially disappear and  are unable to deliver necessary nutrients to the healing tissue.  This is one modifiable factor that you can do to dramatically increase your chances of healing your injury.    (This means no smoking, no nicotine gum, patches, etc)  DO NOT USE NONSTEROIDAL ANTI-INFLAMMATORY DRUGS (NSAID'S)  Using products such as Advil (ibuprofen), Aleve (naproxen), Motrin (ibuprofen) for additional pain control during fracture healing can delay and/or prevent the healing response.  If you would like to take over the counter (OTC) medication, Tylenol (acetaminophen) is ok.  However, some narcotic medications that are given for pain control contain acetaminophen as well. Therefore, you should not exceed more than 4000 mg of tylenol in a day if you do not have liver disease.  Also note that there are may OTC medicines, such as cold medicines and allergy medicines that my contain tylenol as well.  If you have any questions about medications and/or interactions please ask your doctor/PA or your pharmacist.      ICE AND ELEVATE INJURED/OPERATIVE EXTREMITY  Using ice and elevating the injured extremity above your heart can help with swelling and pain control.  Icing in a pulsatile fashion, such as 20 minutes on and 20 minutes off, can be followed.    Do not place ice directly on skin. Make sure there is a barrier between to skin and the ice pack.    Using frozen items such as frozen peas works well as the conform nicely to the are that needs to be iced.  USE AN ACE WRAP OR TED HOSE FOR SWELLING CONTROL  In addition to icing and elevation, Ace wraps or TED hose are used to help limit and resolve swelling.  It is recommended to use Ace wraps or TED hose until you are informed to stop.    When using Ace Wraps start the wrapping distally (farthest away from the body) and wrap proximally (closer to the body)   Example: If you had surgery on your leg or thing and you do not have a splint on, start the ace wrap at the toes and  work your way up to the thigh        If you had surgery on your upper extremity and do not have a splint on, start the ace wrap at your fingers and work your way up to the upper arm  IF YOU ARE IN A SPLINT OR CAST DO NOT REMOVE IT FOR ANY REASON   If your splint gets wet for any reason please contact the office immediately. You may shower in your splint or cast as long as you keep it dry.  This can be done by wrapping in a cast cover or garbage back (or similar)  Do Not stick any thing down your splint or cast such as pencils, money, or hangers to try and scratch yourself with.  If you feel itchy take benadryl as prescribed on the bottle for itching  IF YOU ARE IN A CAM BOOT (BLACK BOOT)  You may remove boot periodically. Perform daily dressing changes as noted below.  Wash the liner of the boot regularly and wear a sock when wearing the  boot. It is recommended that you sleep in the boot until told otherwise  CALL THE OFFICE WITH ANY QUESTIONS OR CONCERNS: [phone]

## 2017-06-08 NOTE — Progress Notes (Signed)
Orthopedic Trauma Service Progress Note   Patient ID: Deborah [name] MRN: [id number] DOB/AGE: [birth year] 61 y.o.  Subjective:  Doing ok Ready to go to SNF No specific complaints    Review of Systems  Constitutional: Negative for chills and fever.  Respiratory: Negative for shortness of breath and wheezing.   Cardiovascular: Negative for chest pain and palpitations.  Gastrointestinal: Negative for abdominal pain, nausea and vomiting.  Neurological: Negative for tingling and sensory change.    Objective:   VITALS:   Vitals:   06/08/17 0648 06/08/17 0710 06/08/17 0931 06/08/17 1100  BP: 116/67  94/64 96/63  Pulse: 91  94 82  Resp: 18  18 16   Temp:  98.3 F (36.8 C) 98.2 F (36.8 C) 98 F (36.7 C)  TempSrc:   Oral Oral  SpO2: 97%  96% 96%  Weight:      Height:        Estimated body mass index is 20.93 kg/m as calculated from the following:   Height as of this encounter: 5\' 5"  (1.651 m).   Weight as of this encounter: 57.1 kg (125 lb 12.8 oz).   Intake/Output      11/07 0701 - 11/08 0700 11/08 0701 - 11/09 0700   P.O. 450 250   I.V. (mL/kg) 922.3 (16.2)    IV Piggyback 150    Total Intake(mL/kg) 1522.3 (26.7) 250 (4.4)   Urine (mL/kg/hr) 1300 (0.9)    Blood     Total Output 1300    Net +222.3 +250          LABS  Results for orders placed or performed during the hospital encounter of 06/06/17 (from the past 24 hour(s))  MRSA PCR Screening     Status: Abnormal   Collection Time: 06/07/17 10:40 PM  Result Value Ref Range   MRSA by PCR POSITIVE (A) NEGATIVE  Basic metabolic panel     Status: Abnormal   Collection Time: 06/08/17  4:20 AM  Result Value Ref Range   Sodium 137 135 - 145 mmol/L   Potassium 4.6 3.5 - 5.1 mmol/L   Chloride 104 101 - 111 mmol/L   CO2 27 22 - 32 mmol/L   Glucose, Bld 111 (H) 65 - 99 mg/dL   BUN 8 6 - 20 mg/dL   Creatinine, Ser 7.820.71 0.44 - 1.00 mg/dL   Calcium 8.6 (L) 8.9 - 10.3 mg/dL   GFR calc  non Af Amer >60 >60 mL/min   GFR calc Af Amer >60 >60 mL/min   Anion gap 6 5 - 15     PHYSICAL EXAM:   Gen: awake and alert, appears comfortable  Lungs: breathing unlabored Cardiac: Regular  Abd: + BS, NT Ext:       Left Lower Extremity   Dressings stable  Dressings removed  Surgical wounds look great (thigh and medial lower leg)  No drainage, no sings of infection   Medial flap looks healthy   Distal motor and sensory functions intact  Ext warm   + DP pulse  No DCT  Swelling well controlled  Knee ROM very restricted  0-30 degrees passively   Assessment/Plan: 2 Days Post-Op   Principal Problem:   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 Active Problems:   MDD (major depressive disorder), recurrent severe, without psychosis (HCC)   GERD without esophagitis   Anti-infectives (From admission, onward)   Start     Dose/Rate Route Frequency Ordered Stop   06/06/17 1945  clindamycin (CLEOCIN) IVPB 600  mg     600 mg 100 mL/hr over 30 Minutes Intravenous Every 6 hours 06/06/17 1939 06/07/17 1642   06/06/17 1400  gentamicin (GARAMYCIN) IVPB 80 mg     80 mg 100 mL/hr over 30 Minutes Intravenous  Once 06/06/17 1347 06/06/17 1439   06/06/17 1400  ceFAZolin (ANCEF) IVPB 2g/100 mL premix     2 g 200 mL/hr over 30 Minutes Intravenous  Once 06/06/17 1347 06/06/17 1355    .  POD/HD#: 2  -Left proximal tibia nonunion s/p grafting   TDWB LLEx  Unrestricted and aggressive L knee ROM   Ice and elevate  Dressing changes as needed starting on arrival to facility   PT/OT   Walker or crutches    - Pain management:  Continue with pre-hospital regimen  - ABL anemia/Hemodynamics  Stable  - Medical issues   Home meds  - DVT/PE prophylaxis:  ASA daily (325 mg)  - ID:   periop abx completed   - Activity:  WBAT R leg  TDWB L leg  Unrestricted ROM B knees  - FEN/GI prophylaxis/Foley/Lines:  Diet as tolerated  - Dispo:  Dc to snf  Follow up with ortho in  10-14 days    Mearl LatinKeith W. Cobin Cadavid, PA-C [facility] [phone] (P) [phone] Traci Sermon(O) [phone] (C) 06/08/2017, 11:16 AM
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
## 2017-06-08 NOTE — Social Work (Signed)
Clinical Social Worker facilitated patient discharge including contacting patient family and facility to confirm patient discharge plans.  Clinical information faxed to facility and family agreeable with plan.    CSW arranged ambulance transport via [facility] 2[facility]30[facility].    RN to call [phone] to give report prior to discharge.  Clinical Social Worker will sign off for now as social work intervention is no longer needed. Please consult us again if new need arises.  Keene BreathPatricia Janeece Blok, LCSW Clinical Social Worker [phone]

## 2017-06-08 NOTE — Progress Notes (Signed)
Patient discharged to facility. EMS to transport. Family at bedside. Discharge papers given and patient verbalized understanding. Prescriptions given.

## 2017-06-08 NOTE — Social Work (Signed)
CSW contacted to SNF to confirm if patient needs to meet qualifying stay as patient is from SNF and has pre-existing injury from initial SNF placement.  SNf will call CSW back.  CSW will f/u for disposition and advise clinical team of same.  Keene BreathPatricia Allisa Einspahr, LCSW Clinical Social Worker [phone]

## 2017-06-08 NOTE — Care Management Note (Signed)
Case Management Note  Patient Details  Name: Deborah [name] MRN: [id number] Date of Birth: 1956/02/15  Subjective/Objective:                 Patient with order to discharge to Skilled Nursing Facility (SNF). SNF discharge facilitated through Clinical Social Work (CSW). Please refer to CSW notes for disposition plan and direct questions CSW on call accordingly. CM signing off   Action/Plan:   Expected Discharge Date:  06/08/17               Expected Discharge Plan:  Skilled Nursing Facility  In-House Referral:  Clinical Social Work  Discharge planning Services  CM Consult  Post Acute Care Choice:    Choice offered to:     DME Arranged:    DME Agency:     HH Arranged:    HH Agency:     Status of Service:  Completed, signed off  If discussed at [employer] Meetings, dates discussed:    Additional Comments:  Lawerance SabalDebbie Coden Franchi, RN 06/08/2017, 2:12 PM

## 2017-06-09 ENCOUNTER — Encounter (HOSPITAL_COMMUNITY): Payer: Self-pay | Admitting: Orthopedic Surgery

## 2017-06-11 ENCOUNTER — Encounter: Payer: Self-pay | Admitting: Internal Medicine

## 2017-06-13 ENCOUNTER — Other Ambulatory Visit: Payer: Self-pay

## 2017-06-13 ENCOUNTER — Non-Acute Institutional Stay (SKILLED_NURSING_FACILITY): Payer: Medicare Other | Admitting: Adult Health

## 2017-06-13 ENCOUNTER — Encounter: Payer: Self-pay | Admitting: Adult Health

## 2017-06-13 DIAGNOSIS — F332 Major depressive disorder, recurrent severe without psychotic features: Secondary | ICD-10-CM | POA: Diagnosis not present

## 2017-06-13 DIAGNOSIS — D649 Anemia, unspecified: Secondary | ICD-10-CM

## 2017-06-13 DIAGNOSIS — S82143K Displaced bicondylar fracture of unspecified tibia, subsequent encounter for closed fracture with nonunion: Secondary | ICD-10-CM | POA: Diagnosis not present

## 2017-06-13 DIAGNOSIS — T402X5A Adverse effect of other opioids, initial encounter: Secondary | ICD-10-CM

## 2017-06-13 DIAGNOSIS — M86461 Chronic osteomyelitis with draining sinus, right tibia and fibula: Secondary | ICD-10-CM

## 2017-06-13 DIAGNOSIS — K219 Gastro-esophageal reflux disease without esophagitis: Secondary | ICD-10-CM

## 2017-06-13 DIAGNOSIS — K5903 Drug induced constipation: Secondary | ICD-10-CM

## 2017-06-13 DIAGNOSIS — R339 Retention of urine, unspecified: Secondary | ICD-10-CM | POA: Diagnosis not present

## 2017-06-13 MED ORDER — OXYCODONE HCL 5 MG PO TABS: ORAL_TABLET | ORAL | 0 refills | Status: DC

## 2017-06-13 MED ORDER — OXYCODONE HCL 5 MG PO TABS: 5.0000 mg | ORAL_TABLET | ORAL | 0 refills | Status: DC | PRN

## 2017-06-13 MED ORDER — ZOLPIDEM TARTRATE 5 MG PO TABS: 5.0000 mg | ORAL_TABLET | Freq: Every day | ORAL | 0 refills | Status: DC
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
## 2017-06-13 NOTE — Telephone Encounter (Signed)
RX faxed to [facility] @ [phone], phone number [phone] 

## 2017-06-14 ENCOUNTER — Other Ambulatory Visit: Payer: Self-pay

## 2017-06-14 DIAGNOSIS — L270 Generalized skin eruption due to drugs and medicaments taken internally: Secondary | ICD-10-CM | POA: Insufficient documentation

## 2017-06-14 MED ORDER — OXYCODONE HCL 10 MG PO TABS: 10.0000 mg | ORAL_TABLET | ORAL | 0 refills | Status: DC | PRN

## 2017-06-14 NOTE — Telephone Encounter (Signed)
RX faxed to [facility] @ [phone], phone number [phone] 

## 2017-06-19 ENCOUNTER — Encounter: Payer: Self-pay | Admitting: Internal Medicine

## 2017-06-19 ENCOUNTER — Non-Acute Institutional Stay (SKILLED_NURSING_FACILITY): Payer: Medicare Other | Admitting: Internal Medicine

## 2017-06-19 DIAGNOSIS — D649 Anemia, unspecified: Secondary | ICD-10-CM | POA: Diagnosis not present

## 2017-06-19 DIAGNOSIS — L270 Generalized skin eruption due to drugs and medicaments taken internally: Secondary | ICD-10-CM

## 2017-06-19 DIAGNOSIS — K219 Gastro-esophageal reflux disease without esophagitis: Secondary | ICD-10-CM

## 2017-06-19 DIAGNOSIS — F332 Major depressive disorder, recurrent severe without psychotic features: Secondary | ICD-10-CM

## 2017-06-19 DIAGNOSIS — K5903 Drug induced constipation: Secondary | ICD-10-CM

## 2017-06-19 DIAGNOSIS — S82143K Displaced bicondylar fracture of unspecified tibia, subsequent encounter for closed fracture with nonunion: Secondary | ICD-10-CM

## 2017-06-19 DIAGNOSIS — R339 Retention of urine, unspecified: Secondary | ICD-10-CM

## 2017-06-19 DIAGNOSIS — T402X5A Adverse effect of other opioids, initial encounter: Secondary | ICD-10-CM

## 2017-06-19 NOTE — Progress Notes (Signed)
Patient ID: Deborah [name], female   DOB: Mar 17, 1956, 61 y.o.   MRN: [id number]     HISTORY AND PHYSICAL   DATE:   June 19, 2017  Location:    [facility] Room Number: 101 A Place of Service: SNF (31)   Extended Emergency Contact Information Primary Emergency Contact: Romig,Una Address: [address] [place], [place] [address] [place] Mobile Phone: [phone] Relation: Sister Secondary Emergency Contact: Karstens,Eileen Address: [address], [place] [address] [place] Phone: [phone] Relation: Mother  Advanced Directive information Does Patient Have a Medical Advance Directive?: No, Type of Advance Directive: Out of facility DNR (pink MOST or yellow form), Pre-existing out of facility DNR order (yellow form or pink MOST form): Yellow form placed in chart (order not valid for inpatient use), Does patient want to make changes to medical advance directive?: No - Patient declined  Chief Complaint  Patient presents with  . 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 She underwent repair of left proximal tibia nonunion, autograft harvest left femur and reamed IM aspirate on 06/06/17 by ortho Dr Marcelino Scot. No immediate postop complications. Sed rate and CRP nml; Hgb 11.1 at d/c. She presents to SNF to continue short term rehab.  Today she has no concerns. She saw Ortho this AM and now on PWB LLE (50%). Next f/u 12/10th. Appetite ok and sleeps well. Pain in LLE controlled. She completed approx 6 weeks of IV abx tx for Osteomyelitis. 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 She takes ASA 327m daily for DVT prophylaxis. She is a poor historian due to psych d/o. Hx obtained from chart  Constipation - stable on colace twice  daily and miralax daily  Urine retention - stable on urecholine 5 mg daily; flomax 0.4 mg daily. Followed by urology  GERD - stable on protonix 40 mg daily  Anemia - stable on fergon twice daily. 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 Insomnia stable on ambien 5 mg nightly. She does benefit from her psych meds  RLE osteomyelitis - resolved; completed IV abx  Concussion - resolved  Past Medical History:  Diagnosis Date  . Anemia    iron deficieny  . Depression   . GERD (gastroesophageal reflux disease)   . Insomnia   . Low back pain   . Muscle weakness   . Neck pain   . [place]   . Restless leg syndrome   . Traumatic open wound of left lower leg 04/27/2017    Past Surgical History:  Procedure Laterality Date  . APPLICATION OF WOUND VAC Right 04/25/2017  . AUTOGRAFT HARVEST LEFT FEMUR, PLACEMENT OF BONE GRAFT LEFT TIBIA FRACTURE Left 06/06/2017   [facility] [place] [facility] . BILATERAL TIBIAS DEBRIDEMENT AND PLACEMENT OF ANTIBIOTIC BEADS LEFT TIBIAS Bilateral 02/24/2017   [facility] [place] [facility] Bilateral 02/22/2017   Performed by [facility] . FACIAL LACERATION REPAIR N/A 02/22/2017  Performed by Newt Minion, MD at [place] Left 06/07/2017   AUTOGRAFT HARVEST LEFT FEMUR, PLACEMENT OF BONE GRAFT LEFT TIBIA FRACTURE/notes 06/07/2017  . FRACTURE SURGERY    . HARDWARE REMOVAL RIGHT KNEE Right 04/25/2017   Performed by Altamese [place], MD at [facility]  . IRRIGATION AND DEBRIDEMENT BILATERL LOWER EXTREMITIES Bilateral 02/22/2017   Performed by Newt Minion, MD at [facility]  . IRRIGATION AND DEBRIDEMENT RIGHT LEG Right 04/27/2017   Performed by Altamese [place], MD at [place]  . OPEN REDUCTION INTERNAL FIXATION (ORIF) TIBIA FRACTURE Bilateral 02/28/2017   Performed by  Altamese Dubois, MD at [place] [facility]  . Open Reduction Internal Fixation Tibial Plateau Left 02/24/2017   Performed by Altamese Mildred, MD at [facility]  . PARTIAL EXCISION RIGHT TIBIA Right 04/25/2017   Performed by Altamese [place], MD at [place]  . PRIMARY CLOSURE Right 04/27/2017   Performed by Altamese Tolar, MD at [facility] LEG Bilateral 02/28/2017   Performed by Altamese , MD at [facility] OR    Patient Care Team: Elwyn Reach, MD as PCP - General (Internal Medicine)  Social History   Socioeconomic History  . Marital status: Divorced    Spouse name: Not on file  . Number of children: Not on file  . Years of education: Not on file  . Highest education level: Not on file  Social Needs  . Financial resource strain: Not on file  . Food insecurity - worry: Not on file  . Food insecurity - inability: Not on file  . Transportation needs - medical: Not on file  . Transportation needs - non-medical: Not on file  Occupational History  . Not on file  Tobacco Use  . Smoking status: Former Smoker    Packs/day: 1.00    Years: 47.00    Pack years: 47.00    Types: Cigarettes    Last attempt to quit: 02/28/2017    Years since quitting: 0.3  . Smokeless tobacco: Never Used  Substance and Sexual Activity  . Alcohol use: No  . Drug use: No  . Sexual activity: No  Other Topics Concern  . Not on file  Social History Narrative  . Not on file     reports that she quit smoking about 3 months ago. Her smoking use included cigarettes. She has a 47.00 pack-year smoking history. she has never used smokeless tobacco. She reports that she does not drink alcohol or use drugs.  Family History  Problem Relation Age of Onset  . Leukemia Father   . Alcohol abuse Sister    Family Status  Relation Name Status  . Mother  Alive       some what frail but in reasonably good health  . Father  Deceased at age 7  . Sister  (Not Specified)    Immunization History  Administered Date(s)  Administered  . Influenza-Unspecified 04/27/2017  . PPD Test 03/30/2017, 06/01/2017  . Pneumococcal-Unspecified 03/20/2017  . Tdap 02/22/2017    Allergies  Allergen Reactions  . Vancomycin Other (See Comments)    Developed 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    Medications:   Medication List        Accurate as of 06/19/17 10:37 AM. Always use your most recent med list.          acetaminophen 500 MG tablet Commonly known as:  TYLENOL Take 1-2 tablets (500-1,000 mg total) every 6 (six) hours as needed  by mouth for mild pain, moderate pain or fever ((score 1 to 3) or temp > 100.5).   aspirin 325 MG EC tablet Take 1 tablet (325 mg total) daily by mouth.   bethanechol 5 MG tablet Commonly known as:  URECHOLINE   buPROPion 300 MG 24 hr tablet Commonly known as:  WELLBUTRIN XL Take 1 tablet (300 mg total) by mouth daily.   Diclofenac Sodium 3 % Gel   diphenhydrAMINE 25 mg capsule Commonly known as:  BENADRYL   docusate sodium 100 MG capsule Commonly known as:  COLACE Take 1 capsule (100 mg total) by mouth 2 (two) times daily.   ferrous gluconate 324 MG tablet Commonly known as:  FERGON Take 1 tablet (324 mg total) by mouth 2 (two) times daily with a meal.   methocarbamol 500 MG tablet Commonly known as:  ROBAXIN Take 1-2 tablets (500-1,000 mg total) every 6 (six) hours as needed by mouth for muscle spasms.   multivitamin with minerals Tabs tablet Take 1 tablet by mouth daily.   mupirocin ointment 2 % Commonly known as:  BACTROBAN Place 1 application 2 (two) times daily into the nose.   Oxycodone HCl 10 MG Tabs Take 1 tablet (10 mg total) every 4 (four) hours as needed by mouth (For severe pain).   pantoprazole 40 MG tablet Commonly known as:  PROTONIX Take 1 tablet (40 mg total) by mouth daily.   polyethylene glycol packet Commonly known as:  MIRALAX / GLYCOLAX Take 17 g by mouth daily.   ranitidine 300 MG tablet Commonly known as:  ZANTAC   REXULTI 2 MG Tabs Generic  drug:  Brexpiprazole   tamsulosin 0.4 MG Caps capsule Commonly known as:  FLOMAX   VIIBRYD 40 MG Tabs Generic drug:  Vilazodone HCl   zolpidem 5 MG tablet Commonly known as:  AMBIEN Take 1 tablet (5 mg total) at bedtime by mouth.       Review of Systems  Musculoskeletal: Positive for arthralgias and gait problem.  Skin: Positive for wound.  Psychiatric/Behavioral: Positive for dysphoric mood and sleep disturbance.  All other systems reviewed and are negative.   Vitals:   06/19/17 1027  BP: 105/63  Pulse: 63  Resp: 20  Temp: 98.1 F (36.7 C)  SpO2: 98%  Weight: 124 lb 3.2 oz (56.3 kg)  Height: _0  (1.651 m)   Body mass index is 20.67 kg/m.  Physical Exam  Constitutional: She appears well-developed and well-nourished.  Frail appearing in NAD, sitting in w/c  HENT:  Mouth/Throat: Oropharynx is clear and moist. No oropharyngeal exudate.  MMM; no oral thrush  Eyes: Pupils are equal, round, and reactive to light. No scleral icterus.  Neck: Neck supple. Carotid bruit is not present. No tracheal deviation present. No thyromegaly present.  Cardiovascular: Normal rate, regular rhythm and intact distal pulses. Exam reveals no gallop and no friction rub.  Murmur (2/6 SEM) heard. Trace LLE edema present with dsg intact. No calf TTP  Pulmonary/Chest: Effort normal and breath sounds normal. No stridor. No respiratory distress. She has no wheezes. She has no rales.  Abdominal: Soft. Normal appearance and bowel sounds are normal. She exhibits no distension and no mass. There is no hepatomegaly. There is no tenderness. There is no rigidity, no rebound and no guarding. No hernia.  Musculoskeletal: She exhibits edema.  Lymphadenopathy:    She has no cervical adenopathy.  Neurological: She is alert.  Skin: Skin is warm and dry. No rash noted.  Left lateral thigh  skin graft site well healed  Psychiatric: She has a normal mood and affect. Her behavior is normal. Judgment and thought  content normal.     Labs reviewed: Admission on 06/06/2017, Discharged on 06/08/2017  Component Date Value Ref Range Status  . WBC 06/06/2017 6.2  4.0 - 10.5 K/uL Final  . RBC 06/06/2017 4.42  3.87 - 5.11 MIL/uL Final  . Hemoglobin 06/06/2017 12.8  12.0 - 15.0 g/dL Final  . HCT 06/06/2017 40.6  36.0 - 46.0 % Final  . MCV 06/06/2017 91.9  78.0 - 100.0 fL Final  . MCH 06/06/2017 29.0  26.0 - 34.0 pg Final  . MCHC 06/06/2017 31.5  30.0 - 36.0 g/dL Final  . RDW 06/06/2017 14.2  11.5 - 15.5 % Final  . Platelets 06/06/2017 226  150 - 400 K/uL Final  . Neutrophils Relative % 06/06/2017 53  % Final  . Neutro Abs 06/06/2017 3.2  1.7 - 7.7 K/uL Final  . Lymphocytes Relative 06/06/2017 27  % Final  . Lymphs Abs 06/06/2017 1.7  0.7 - 4.0 K/uL Final  . Monocytes Relative 06/06/2017 8  % Final  . Monocytes Absolute 06/06/2017 0.5  0.1 - 1.0 K/uL Final  . Eosinophils Relative 06/06/2017 11  % Final  . Eosinophils Absolute 06/06/2017 0.7  0.0 - 0.7 K/uL Final  . Basophils Relative 06/06/2017 1  % Final  . Basophils Absolute 06/06/2017 0.1  0.0 - 0.1 K/uL Final  . Sodium 06/06/2017 137  135 - 145 mmol/L Final  . Potassium 06/06/2017 4.3  3.5 - 5.1 mmol/L Final  . Chloride 06/06/2017 103  101 - 111 mmol/L Final  . CO2 06/06/2017 26  22 - 32 mmol/L Final  . Glucose, Bld 06/06/2017 91  65 - 99 mg/dL Final  . BUN 06/06/2017 14  6 - 20 mg/dL Final  . Creatinine, Ser 06/06/2017 0.70  0.44 - 1.00 mg/dL Final  . Calcium 06/06/2017 9.5  8.9 - 10.3 mg/dL Final  . Total Protein 06/06/2017 6.2* 6.5 - 8.1 g/dL Final  . Albumin 06/06/2017 4.0  3.5 - 5.0 g/dL Final  . AST 06/06/2017 21  15 - 41 U/L Final  . ALT 06/06/2017 26  14 - 54 U/L Final  . Alkaline Phosphatase 06/06/2017 109  38 - 126 U/L Final  . Total Bilirubin 06/06/2017 0.5  0.3 - 1.2 mg/dL Final  . GFR calc non Af Amer 06/06/2017 >60  >60 mL/min Final  . GFR calc Af Amer 06/06/2017 >60  >60 mL/min Final   Comment: (NOTE) The eGFR has been  calculated using the CKD EPI equation. This calculation has not been validated in all clinical situations. eGFR's persistently <60 mL/min signify possible Chronic Kidney Disease.   . Anion gap 06/06/2017 8  5 - 15 Final  . Prothrombin Time 06/06/2017 12.8  11.4 - 15.2 seconds Final  . INR 06/06/2017 0.97   Final  . aPTT 06/06/2017 32  24 - 36 seconds Final  . Color, Urine 06/06/2017 YELLOW  YELLOW Final  . APPearance 06/06/2017 CLOUDY* CLEAR Final  . Specific Gravity, Urine 06/06/2017 1.019  1.005 - 1.030 Final  . pH 06/06/2017 7.0  5.0 - 8.0 Final  . Glucose, UA 06/06/2017 NEGATIVE  NEGATIVE mg/dL Final  . Hgb urine dipstick 06/06/2017 SMALL* NEGATIVE Final  . Bilirubin Urine 06/06/2017 NEGATIVE  NEGATIVE Final  . Ketones, ur 06/06/2017 NEGATIVE  NEGATIVE mg/dL Final  . Protein, ur 06/06/2017 NEGATIVE  NEGATIVE mg/dL Final  . Nitrite 06/06/2017 POSITIVE* NEGATIVE  Final  . Leukocytes, UA 06/06/2017 LARGE* NEGATIVE Final  . RBC / HPF 06/06/2017 NONE SEEN  0 - 5 RBC/hpf Final  . WBC, UA 06/06/2017 TOO NUMEROUS TO COUNT  0 - 5 WBC/hpf Final  . Bacteria, UA 06/06/2017 MANY* NONE SEEN Final  . Squamous Epithelial / LPF 06/06/2017 0-5* NONE SEEN Final  . Sed Rate 06/06/2017 2  0 - 22 mm/hr Final  . CRP 06/06/2017 <0.8  <1.0 mg/dL Final  . Sodium 06/07/2017 134* 135 - 145 mmol/L Final  . Potassium 06/07/2017 4.2  3.5 - 5.1 mmol/L Final  . Chloride 06/07/2017 103  101 - 111 mmol/L Final  . CO2 06/07/2017 23  22 - 32 mmol/L Final  . Glucose, Bld 06/07/2017 129* 65 - 99 mg/dL Final  . BUN 06/07/2017 12  6 - 20 mg/dL Final  . Creatinine, Ser 06/07/2017 0.73  0.44 - 1.00 mg/dL Final  . Calcium 06/07/2017 8.7* 8.9 - 10.3 mg/dL Final  . GFR calc non Af Amer 06/07/2017 >60  >60 mL/min Final  . GFR calc Af Amer 06/07/2017 >60  >60 mL/min Final   Comment: (NOTE) The eGFR has been calculated using the CKD EPI equation. This calculation has not been validated in all clinical situations. eGFR's  persistently <60 mL/min signify possible Chronic Kidney Disease.   . Anion gap 06/07/2017 8  5 - 15 Final  . WBC 06/07/2017 9.6  4.0 - 10.5 K/uL Final  . RBC 06/07/2017 3.79* 3.87 - 5.11 MIL/uL Final  . Hemoglobin 06/07/2017 11.1* 12.0 - 15.0 g/dL Final  . HCT 06/07/2017 34.3* 36.0 - 46.0 % Final  . MCV 06/07/2017 90.5  78.0 - 100.0 fL Final  . MCH 06/07/2017 29.3  26.0 - 34.0 pg Final  . MCHC 06/07/2017 32.4  30.0 - 36.0 g/dL Final  . RDW 06/07/2017 13.8  11.5 - 15.5 % Final  . Platelets 06/07/2017 209  150 - 400 K/uL Final  . Sodium 06/08/2017 137  135 - 145 mmol/L Final  . Potassium 06/08/2017 4.6  3.5 - 5.1 mmol/L Final  . Chloride 06/08/2017 104  101 - 111 mmol/L Final  . CO2 06/08/2017 27  22 - 32 mmol/L Final  . Glucose, Bld 06/08/2017 111* 65 - 99 mg/dL Final  . BUN 06/08/2017 8  6 - 20 mg/dL Final  . Creatinine, Ser 06/08/2017 0.71  0.44 - 1.00 mg/dL Final  . Calcium 06/08/2017 8.6* 8.9 - 10.3 mg/dL Final  . GFR calc non Af Amer 06/08/2017 >60  >60 mL/min Final  . GFR calc Af Amer 06/08/2017 >60  >60 mL/min Final   Comment: (NOTE) The eGFR has been calculated using the CKD EPI equation. This calculation has not been validated in all clinical situations. eGFR's persistently <60 mL/min signify possible Chronic Kidney Disease.   . Anion gap 06/08/2017 6  5 - 15 Final  . MRSA by PCR 06/07/2017 POSITIVE* NEGATIVE Final   Comment:        The GeneXpert MRSA Assay (FDA approved for NASAL specimens only), is one component of a comprehensive MRSA colonization surveillance program. It is not intended to diagnose MRSA infection nor to guide or monitor treatment for MRSA infections. RESULT CALLED TO, READ BACK BY AND VERIFIED WITH:  TO VICTORIA  RESULT CALLED TO, READ BACK BY AND VERIFIED WITH:  TO VUYEKUE(RN) BY TCLEVELAND 06/08/2017 AT 5:22AM   Abstract on 05/30/2017  Component Date Value Ref Range Status  . Hemoglobin 05/29/2017 12.5  12.0 - 16.0 Final  .  HCT 05/29/2017  39  36 - 46 Final  . Neutrophils Absolute 05/29/2017 4   Final  . Platelets 05/29/2017 248  150 - 399 Final  . WBC 05/29/2017 6.6   Final  . Glucose 05/29/2017 127   Final  . BUN 05/29/2017 23* 4 - 21 Final  . Creatinine 05/29/2017 0.6  0.5 - 1.1 Final  . Potassium 05/29/2017 4.3  3.4 - 5.3 Final  . Sodium 05/29/2017 145  137 - 147 Final  Nursing Home on 05/16/2017  Component Date Value Ref Range Status  . INR 03/29/2017 2.7* 0.9 - 1.1 Final  . Protime 03/29/2017 28.1* 10.0 - 13.8 Final  . Glucose 05/16/2017 96   Final  . BUN 05/16/2017 17  4 - 21 Final  . Creatinine 05/16/2017 0.6  0.5 - 1.1 Final  . Potassium 05/16/2017 4.5  3.4 - 5.3 Final  . Sodium 05/16/2017 140  137 - 147 Final  Nursing Home on 05/04/2017  Component Date Value Ref Range Status  . INR 04/10/2017 1.8* 0.9 - 1.1 Final  . Protime 04/10/2017 20.8* 10.0 - 13.8 Final  Admission on 04/25/2017, Discharged on 04/27/2017  Component Date Value Ref Range Status  . WBC 04/25/2017 5.4  4.0 - 10.5 K/uL Final  . RBC 04/25/2017 4.04  3.87 - 5.11 MIL/uL Final  . Hemoglobin 04/25/2017 11.8* 12.0 - 15.0 g/dL Final  . HCT 04/25/2017 37.9  36.0 - 46.0 % Final  . MCV 04/25/2017 93.8  78.0 - 100.0 fL Final  . MCH 04/25/2017 29.2  26.0 - 34.0 pg Final  . MCHC 04/25/2017 31.1  30.0 - 36.0 g/dL Final  . RDW 04/25/2017 14.4  11.5 - 15.5 % Final  . Platelets 04/25/2017 212  150 - 400 K/uL Final  . Neutrophils Relative % 04/25/2017 64  % Final  . Neutro Abs 04/25/2017 3.5  1.7 - 7.7 K/uL Final  . Lymphocytes Relative 04/25/2017 23  % Final  . Lymphs Abs 04/25/2017 1.2  0.7 - 4.0 K/uL Final  . Monocytes Relative 04/25/2017 7  % Final  . Monocytes Absolute 04/25/2017 0.4  0.1 - 1.0 K/uL Final  . Eosinophils Relative 04/25/2017 5  % Final  . Eosinophils Absolute 04/25/2017 0.3  0.0 - 0.7 K/uL Final  . Basophils Relative 04/25/2017 1  % Final  . Basophils Absolute 04/25/2017 0.0  0.0 - 0.1 K/uL Final  . Sodium 04/25/2017 140  135 - 145  mmol/L Final  . Potassium 04/25/2017 4.1  3.5 - 5.1 mmol/L Final  . Chloride 04/25/2017 105  101 - 111 mmol/L Final  . CO2 04/25/2017 27  22 - 32 mmol/L Final  . Glucose, Bld 04/25/2017 102* 65 - 99 mg/dL Final  . BUN 04/25/2017 16  6 - 20 mg/dL Final  . Creatinine, Ser 04/25/2017 0.67  0.44 - 1.00 mg/dL Final  . Calcium 04/25/2017 9.2  8.9 - 10.3 mg/dL Final  . Total Protein 04/25/2017 5.8* 6.5 - 8.1 g/dL Final  . Albumin 04/25/2017 3.8  3.5 - 5.0 g/dL Final  . AST 04/25/2017 20  15 - 41 U/L Final  . ALT 04/25/2017 15  14 - 54 U/L Final  . Alkaline Phosphatase 04/25/2017 115  38 - 126 U/L Final  . Total Bilirubin 04/25/2017 0.2* 0.3 - 1.2 mg/dL Final  . GFR calc non Af Amer 04/25/2017 >60  >60 mL/min Final  . GFR calc Af Amer 04/25/2017 >60  >60 mL/min Final   Comment: (NOTE) The eGFR has been calculated  using the CKD EPI equation. This calculation has not been validated in all clinical situations. eGFR's persistently <60 mL/min signify possible Chronic Kidney Disease.   . Anion gap 04/25/2017 8  5 - 15 Final  . Prothrombin Time 04/25/2017 25.4* 11.4 - 15.2 seconds Final  . INR 04/25/2017 2.33   Final  . aPTT 04/25/2017 39* 24 - 36 seconds Final   Comment:        IF BASELINE aPTT IS ELEVATED, SUGGEST PATIENT RISK ASSESSMENT BE USED TO DETERMINE APPROPRIATE ANTICOAGULANT THERAPY.   Marland Kitchen Specimen Description 04/25/2017 WOUND RIGHT   Final  . Special Requests 04/25/2017 RIGHT TIBIAL PLATE SWABS   Final  . Gram Stain 04/25/2017    Final                   Value:RARE WBC PRESENT,BOTH PMN AND MONONUCLEAR NO ORGANISMS SEEN   . Culture 04/25/2017    Final                   Value:NO ANAEROBES ISOLATED RARE ENTEROBACTER SPECIES   . Report Status 04/25/2017 04/30/2017 FINAL   Final  . Organism ID, Bacteria 04/25/2017 ENTEROBACTER SPECIES   Final  . WBC 04/25/2017 6.4  4.0 - 10.5 K/uL Final  . RBC 04/25/2017 3.76* 3.87 - 5.11 MIL/uL Final  . Hemoglobin 04/25/2017 11.0* 12.0 - 15.0 g/dL  Final  . HCT 04/25/2017 34.7* 36.0 - 46.0 % Final  . MCV 04/25/2017 92.3  78.0 - 100.0 fL Final  . MCH 04/25/2017 29.3  26.0 - 34.0 pg Final  . MCHC 04/25/2017 31.7  30.0 - 36.0 g/dL Final  . RDW 04/25/2017 14.0  11.5 - 15.5 % Final  . Platelets 04/25/2017 204  150 - 400 K/uL Final  . Creatinine, Ser 04/25/2017 0.55  0.44 - 1.00 mg/dL Final  . GFR calc non Af Amer 04/25/2017 >60  >60 mL/min Final  . GFR calc Af Amer 04/25/2017 >60  >60 mL/min Final   Comment: (NOTE) The eGFR has been calculated using the CKD EPI equation. This calculation has not been validated in all clinical situations. eGFR's persistently <60 mL/min signify possible Chronic Kidney Disease.   . Sodium 04/26/2017 136  135 - 145 mmol/L Final  . Potassium 04/26/2017 4.2  3.5 - 5.1 mmol/L Final  . Chloride 04/26/2017 100* 101 - 111 mmol/L Final  . CO2 04/26/2017 29  22 - 32 mmol/L Final  . Glucose, Bld 04/26/2017 116* 65 - 99 mg/dL Final  . BUN 04/26/2017 15  6 - 20 mg/dL Final  . Creatinine, Ser 04/26/2017 0.62  0.44 - 1.00 mg/dL Final  . Calcium 04/26/2017 8.8* 8.9 - 10.3 mg/dL Final  . GFR calc non Af Amer 04/26/2017 >60  >60 mL/min Final  . GFR calc Af Amer 04/26/2017 >60  >60 mL/min Final   Comment: (NOTE) The eGFR has been calculated using the CKD EPI equation. This calculation has not been validated in all clinical situations. eGFR's persistently <60 mL/min signify possible Chronic Kidney Disease.   . Anion gap 04/26/2017 7  5 - 15 Final  . WBC 04/26/2017 8.7  4.0 - 10.5 K/uL Final  . RBC 04/26/2017 3.30* 3.87 - 5.11 MIL/uL Final  . Hemoglobin 04/26/2017 9.8* 12.0 - 15.0 g/dL Final  . HCT 04/26/2017 30.5* 36.0 - 46.0 % Final  . MCV 04/26/2017 92.4  78.0 - 100.0 fL Final  . MCH 04/26/2017 29.7  26.0 - 34.0 pg Final  . MCHC 04/26/2017 32.1  30.0 -  36.0 g/dL Final  . RDW 04/26/2017 14.3  11.5 - 15.5 % Final  . Platelets 04/26/2017 202  150 - 400 K/uL Final  . Sodium 04/27/2017 133* 135 - 145 mmol/L Final    . Potassium 04/27/2017 3.7  3.5 - 5.1 mmol/L Final  . Chloride 04/27/2017 99* 101 - 111 mmol/L Final  . CO2 04/27/2017 27  22 - 32 mmol/L Final  . Glucose, Bld 04/27/2017 106* 65 - 99 mg/dL Final  . BUN 04/27/2017 13  6 - 20 mg/dL Final  . Creatinine, Ser 04/27/2017 0.62  0.44 - 1.00 mg/dL Final  . Calcium 04/27/2017 8.6* 8.9 - 10.3 mg/dL Final  . GFR calc non Af Amer 04/27/2017 >60  >60 mL/min Final  . GFR calc Af Amer 04/27/2017 >60  >60 mL/min Final   Comment: (NOTE) The eGFR has been calculated using the CKD EPI equation. This calculation has not been validated in all clinical situations. eGFR's persistently <60 mL/min signify possible Chronic Kidney Disease.   . Anion gap 04/27/2017 7  5 - 15 Final  . WBC 04/27/2017 7.7  4.0 - 10.5 K/uL Final  . RBC 04/27/2017 3.22* 3.87 - 5.11 MIL/uL Final  . Hemoglobin 04/27/2017 9.5* 12.0 - 15.0 g/dL Final  . HCT 04/27/2017 29.7* 36.0 - 46.0 % Final  . MCV 04/27/2017 92.2  78.0 - 100.0 fL Final  . MCH 04/27/2017 29.5  26.0 - 34.0 pg Final  . MCHC 04/27/2017 32.0  30.0 - 36.0 g/dL Final  . RDW 04/27/2017 14.1  11.5 - 15.5 % Final  . Platelets 04/27/2017 179  150 - 400 K/uL Final  Abstract on 04/25/2017  Component Date Value Ref Range Status  . INR 04/20/2017 3.4* 0.9 - 1.1 Final  . Protime 04/20/2017 33.6* 10.0 - 13.8 Final  Abstract on 04/24/2017  Component Date Value Ref Range Status  . INR 04/24/2017 1.9* 0.9 - 1.1 Final  . Protime 04/24/2017 21.7* 10.0 - 13.8 Final  Abstract on 04/21/2017  Component Date Value Ref Range Status  . INR 04/21/2017 1.9* 0.9 - 1.1 Final  . Protime 04/21/2017 21.0* 10.0 - 13.8 Final  Abstract on 04/19/2017  Component Date Value Ref Range Status  . INR 04/19/2017 3.9* 0.9 - 1.1 Final  Abstract on 04/17/2017  Component Date Value Ref Range Status  . INR 04/17/2017 3.3* 0.9 - 1.1 Final  . Protime 04/17/2017 32.7* 10.0 - 13.8 Final  There may be more visits with results that are not included.    Dg  Tibia/fibula Left  Result Date: 06/06/2017 CLINICAL DATA:  Nonhealing bone material. EXAM: DG C-ARM 61-120 MIN; LEFT TIBIA AND FIBULA - 2 VIEW; LEFT FEMUR 2 VIEWS FLUOROSCOPY TIME:  1 minutes 22 seconds. COMPARISON:  Fluoroscopic images of April 27, 2017. FINDINGS: Six fluoroscopic images were obtained of the proximal left tibia and fibula, with 4 fluoroscopic images obtained of the left femur. These images demonstrate removal of nonhealing bone material from the proximal left tibia and fibula. Insertion of bone graft from femur was then performed. Screw and plate fixation of proximal tibia is noted. Old proximal left fibular fracture is noted as well. IMPRESSION: Surgical removal of nonhealing bone material from proximal left tibia and fibula, with insertion of bone graft from left femur. Electronically Signed   By: Marijo Conception, M.D.   On: 06/06/2017 16:19   Dg Knee Left Port  Result Date: 06/06/2017 CLINICAL DATA:  Postop leg repair EXAM: PORTABLE LEFT KNEE - 1-2 VIEW COMPARISON:  02/28/2017, 06/06/2017 FINDINGS: Surgical plate and multiple screw fixation of the proximal to mid tibia with placement of graft in the proximal tibia. Near anatomic alignment. Healing displaced fracture of the proximal fibula. Lucent defects in the mid to distal shaft of the tibia consistent with prior hardware. Soft tissue swelling. A adjacent to the distal femoral cortex. Amorphous calcification adjacent to the distal shaft of the femur likely due to prior trauma. IMPRESSION: 1. Surgical plate and screw fixation of the proximal to mid tibia across prior fracture deformity with placement of graft in the proximal tibia. Near anatomic alignment. 2. Healing displaced proximal fibular fracture. Electronically Signed   By: Donavan Foil M.D.   On: 06/06/2017 23:29   Dg C-arm 61-120 Min  Result Date: 06/06/2017 CLINICAL DATA:  Nonhealing bone material. EXAM: DG C-ARM 61-120 MIN; LEFT TIBIA AND FIBULA - 2 VIEW; LEFT FEMUR 2  VIEWS FLUOROSCOPY TIME:  1 minutes 22 seconds. COMPARISON:  Fluoroscopic images of April 27, 2017. FINDINGS: Six fluoroscopic images were obtained of the proximal left tibia and fibula, with 4 fluoroscopic images obtained of the left femur. These images demonstrate removal of nonhealing bone material from the proximal left tibia and fibula. Insertion of bone graft from femur was then performed. Screw and plate fixation of proximal tibia is noted. Old proximal left fibular fracture is noted as well. IMPRESSION: Surgical removal of nonhealing bone material from proximal left tibia and fibula, with insertion of bone graft from left femur. Electronically Signed   By: Marijo Conception, M.D.   On: 06/06/2017 16:19   Dg Femur Min 2 Views Left  Result Date: 06/06/2017 CLINICAL DATA:  Nonhealing bone material. EXAM: DG C-ARM 61-120 MIN; LEFT TIBIA AND FIBULA - 2 VIEW; LEFT FEMUR 2 VIEWS FLUOROSCOPY TIME:  1 minutes 22 seconds. COMPARISON:  Fluoroscopic images of April 27, 2017. FINDINGS: Six fluoroscopic images were obtained of the proximal left tibia and fibula, with 4 fluoroscopic images obtained of the left femur. These images demonstrate removal of nonhealing bone material from the proximal left tibia and fibula. Insertion of bone graft from femur was then performed. Screw and plate fixation of proximal tibia is noted. Old proximal left fibular fracture is noted as well. IMPRESSION: Surgical removal of nonhealing bone material from proximal left tibia and fibula, with insertion of bone graft from left femur. Electronically Signed   By: Marijo Conception, M.D.   On: 06/06/2017 16:19     Assessment/Plan   ICD-10-CM   1. 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 MDD (major depressive disorder), recurrent severe, without psychosis ([place]) F33.2   3. Red man syndrome L27.0    resolved with cessation of IV vanco    4. Anemia, unspecified type D64.9    stable  5. Urinary retention R33.9    stable; followed by urology  6. Constipation due to opioid therapy K59.03    T40.2X5A    stable  7. GERD without esophagitis K21.9    Cont current meds as ordered  PT/OT/ST as ordered  F/u with Ortho as scheduled  Wound care as ordered  GOAL: short term rehab and d/c home when medically appropriate. Communicated with pt and nursing.  Will follow  Catalia Massett S. Perlie Gold  [facility] [address] [place], [place] [address] [phone] Cell (Monday-Friday 8 AM - 5 PM) [phone] After 5  PM and follow prompts

## 2017-06-26 ENCOUNTER — Other Ambulatory Visit: Payer: Self-pay

## 2017-06-26 ENCOUNTER — Encounter: Payer: Self-pay | Admitting: Adult Health

## 2017-06-26 ENCOUNTER — Non-Acute Institutional Stay (SKILLED_NURSING_FACILITY): Payer: Medicare Other | Admitting: Adult Health

## 2017-06-26 DIAGNOSIS — M86461 Chronic osteomyelitis with draining sinus, right tibia and fibula: Secondary | ICD-10-CM

## 2017-06-26 DIAGNOSIS — S82201J Unspecified fracture of shaft of right tibia, subsequent encounter for open fracture type IIIA, IIIB, or IIIC with delayed healing: Secondary | ICD-10-CM | POA: Diagnosis not present

## 2017-06-26 DIAGNOSIS — S82202J Unspecified fracture of shaft of left tibia, subsequent encounter for open fracture type IIIA, IIIB, or IIIC with delayed healing: Secondary | ICD-10-CM | POA: Diagnosis not present

## 2017-06-26 DIAGNOSIS — S82252N Displaced comminuted fracture of shaft of left tibia, subsequent encounter for open fracture type IIIA, IIIB, or IIIC with nonunion: Secondary | ICD-10-CM | POA: Diagnosis not present

## 2017-06-26 MED ORDER — OXYCODONE HCL 10 MG PO TABS: 10.0000 mg | ORAL_TABLET | ORAL | 0 refills | Status: DC | PRN

## 2017-06-26 NOTE — Telephone Encounter (Signed)
Sent with patient at discharge 

## 2017-06-26 NOTE — Op Note (Signed)
NAME:  Deborah [name], Deborah [name]               ACCOUNT NO.:  MEDICAL RECORD NO.:  [id number]  LOCATION:                                 FACILITY:  PHYSICIAN:  Doralee AlbinoMichael H. Carola FrostHandy, M.D. 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  PROCEDURES: 1. Repair of left tibia nonunion with reamed intramedullary aspiration     autograft from the left femur. 2. Removal of antibiotic spacer. 3. Stress evaluation of the nonunion site under fluoro.  SURGEON:  Doralee AlbinoMichael H. Carola FrostHandy, MD.  ASSISTANT:  Montez MoritaKeith Paul, PA-C.  ANESTHESIA:  General.  COMPLICATIONS:  None.  TOURNIQUET:  None.  FINDINGS:  Some bridging bone consistent with some marginal stability despite large cavitary nonunion.  SPECIMENS:  Bone sent to Micro for anaerobic and aerobic culture.  DISPOSITION:  To PACU.  CONDITION:  Stable.  BRIEF SUMMARY OF INDICATION FOR PROCEDURE:  Deborah [name] is a very pleasant female, status post bilateral open tibia fractures with threatened extremities __________ potential limb loss.  She has undergone extensive serial procedures including the placement of antibiotic spacer, ORIF and subsequent rotational flap [facility]  The patient presents for what we hope to be a final procedure, removal of the spacer, reamed intramedullary aspiration, and possible plate revision to provide new cycles.  I did discuss the potential for deep infection resulting in limb loss, nerve injury, vessel injury, DVT, PE, failure to obtain union and complications related to harvest of the graft including femur fracture. After full discussion, she and her sister did wish to proceed.  BRIEF  SUMMARY OF PROCEDURE:  The patient was taken to the operating room where general anesthesia was induced.  Because there was no concern for active infection, she did receive preoperative antibiotics.  C-arm was brought in to identify the correct place to make the incision and we did feel that we would need to elevate the flap.  However, fortunately, we were able to make an incision proximal to the flap and dissected directly down to the antibiotic spacer with an osteotome to crack the spacer and then remove it piecemeal.  Exploration of the cavity identified a sizable defect, which measured nearly 8 cm x 4 cm x 3 cm. Because of the extensive amount of graft required, decision was made to proceed as planned with reamed intramedullary aspiration.  C-arm was brought in.  Appropriate starting point and trajectory made for the reaming aspirator.  Curved cannulated awl was advanced to the piriformis fossa, checked on 2 views.  The threaded guide tip inserted into the center of the proximal femur and then the entry reamer.  Through this, we sized and introduced the appropriate reamed intramedullary aspirator. This was then advanced into the femur.  We obtained an outstanding amount and quality of graft.  This was then mixed with cancellous chips. Prior to insertion, I did bring in the C-arm and under stress evaluation, checked  the boundaries of this large cavitary nonunion finding some bridging bone and no excessive mobility or instability. Consequently, decision was made to maintain the current plate.  It should be noted that the membrane produced by the antibiotic spacer was split longitudinally.  The graft was packed into this defect and then repaired with #1 Vicryl to contain it entirely within the membrane and we had outstanding fill both proximally, distally, posteriorly, and anteriorly.  The wound was irrigated thoroughly, closed in standard layered fashion.  I did obtain bone specimens  from the deep cavity for micro.  The patient was taken to PACU in stable condition after application of sterile dressing.  I did have an Geophysicist/field seismologistassistant throughout. An assistant was necessary for control of the femur during obtaining the harvest graft as well as assisted with exposure and closure, so that we could expedite her case.  PROGNOSIS:  The patient will be touchdown weightbearing with plans to advance weightbearing, most likely around the 4-week mark of unrestricted range of motion.  We are hopeful for negative cultures of all these and see if any additional antibiotics will be warranted.     Doralee AlbinoMichael H. Carola FrostHandy, M.D.     MHH/MEDQ  D:  06/25/2017  T:  06/25/2017  Job:  [id number]
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
## 2017-06-27 ENCOUNTER — Other Ambulatory Visit: Payer: Self-pay

## 2017-06-27 MED ORDER — ZOLPIDEM TARTRATE 5 MG PO TABS: 5.0000 mg | ORAL_TABLET | Freq: Every day | ORAL | 0 refills | Status: DC

## 2017-06-27 NOTE — Telephone Encounter (Signed)
RX faxed to [facility] @ [phone], phone number [phone] 

## 2017-09-05 ENCOUNTER — Encounter: Payer: Self-pay | Admitting: Physical Therapy

## 2017-09-05 ENCOUNTER — Ambulatory Visit: Payer: Medicare Other | Attending: Orthopedic Surgery | Admitting: Physical Therapy

## 2017-09-05 ENCOUNTER — Other Ambulatory Visit: Payer: Self-pay

## 2017-09-05 DIAGNOSIS — M6281 Muscle weakness (generalized): Secondary | ICD-10-CM | POA: Diagnosis present

## 2017-09-05 DIAGNOSIS — M79661 Pain in right lower leg: Secondary | ICD-10-CM | POA: Diagnosis present

## 2017-09-05 DIAGNOSIS — R262 Difficulty in walking, not elsewhere classified: Secondary | ICD-10-CM | POA: Diagnosis present

## 2017-09-06 ENCOUNTER — Encounter: Payer: Self-pay | Admitting: Physical Therapy

## 2017-09-06 NOTE — Therapy (Signed)
[facility] [address] [place], [place], [address] Phone: [phone]   Fax:  [phone]  Physical Therapy Evaluation  Patient Details  Name: Deborah GrinderBernadette M Bertling MRN: [id number] Date of Birth: 1955/09/19 Referring Provider: Dr Myrene GalasMichael Handy    Encounter Date: 09/05/2017  PT End of Session - 09/06/17 0900    Visit Number  1    Number of Visits  16    Date for PT Re-Evaluation  11/01/17    Authorization Type  Medicare/ medicaid 2nd     PT Start Time  1145    PT Stop Time  1230    PT Time Calculation (min)  45 min    Activity Tolerance  Patient tolerated treatment well    Behavior During Therapy  [facility] for tasks assessed/performed       Past Medical History:  Diagnosis Date  . Anemia    iron deficieny  . Depression   . GERD (gastroesophageal reflux disease)   . Insomnia   . Low back pain   . Muscle weakness   . Neck pain   . Osteomyelitis of right tibia (HCC)   . Restless leg syndrome   . Traumatic open wound of left lower leg 04/27/2017    Past Surgical History:  Procedure Laterality Date  . APPLICATION OF WOUND VAC Right 04/25/2017  . BONE EXCISION Right 04/25/2017   Procedure: PARTIAL EXCISION RIGHT TIBIA;  Surgeon: Myrene GalasHandy, Michael, MD;  [facility];  Service: Orthopedics;  Laterality: Right;  . EXTERNAL FIXATION LEG Bilateral 02/22/2017   Procedure: EXTERNAL FIXATION LEFT LOWER LEG;  Surgeon: Nadara Mustarduda, Marcus V, MD;  [facility];  Service: Orthopedics;  Laterality: Bilateral;  . EXTERNAL FIXATION REMOVAL Bilateral 02/28/2017   Procedure: REMOVAL EXTERNAL FIXATION LEG;  Surgeon: Myrene GalasHandy, Michael, MD;  [facility];  Service: Orthopedics;  Laterality: Bilateral;  . FACIAL LACERATION REPAIR N/A 02/22/2017   Procedure: FACIAL LACERATION REPAIR;  Surgeon: Nadara Mustarduda, Marcus V, MD;  [facility];  Service: Orthopedics;  Laterality: N/A;  . FEMUR SURGERY Left 06/07/2017   AUTOGRAFT HARVEST LEFT FEMUR, PLACEMENT OF BONE  GRAFT LEFT TIBIA FRACTURE/notes 06/07/2017  . FRACTURE SURGERY    . HARDWARE REMOVAL Right 04/25/2017   Procedure: HARDWARE REMOVAL RIGHT KNEE;  Surgeon: Myrene GalasHandy, Michael, MD;  [facility];  Service: Orthopedics;  Laterality: Right;  . I&D EXTREMITY Bilateral 02/22/2017   Procedure: IRRIGATION AND DEBRIDEMENT BILATERL LOWER EXTREMITIES;  Surgeon: Nadara Mustarduda, Marcus V, MD;  [facility];  Service: Orthopedics;  Laterality: Bilateral;  . I&D EXTREMITY Bilateral 02/24/2017   Procedure: BILATERAL TIBIAS DEBRIDEMENT AND PLACEMENT OF ANTIBIOTIC BEADS LEFT TIBIAS;  Surgeon: Myrene GalasHandy, Michael, MD;  [facility];  Service: Orthopedics;  Laterality: Bilateral;  . I&D EXTREMITY Right 04/27/2017   Procedure: IRRIGATION AND DEBRIDEMENT RIGHT LEG;  Surgeon: Myrene GalasHandy, Michael, MD;  [facility];  Service: Orthopedics;  Laterality: Right;  . ORIF TIBIA FRACTURE Bilateral 02/28/2017   Procedure: OPEN REDUCTION INTERNAL FIXATION (ORIF) TIBIA FRACTURE;  Surgeon: Myrene GalasHandy, Michael, MD;  [facility];  Service: Orthopedics;  Laterality: Bilateral;  . ORIF TIBIA FRACTURE Left 06/06/2017   Procedure: AUTOGRAFT HARVEST LEFT FEMUR, PLACEMENT OF BONE GRAFT LEFT TIBIA FRACTURE;  Surgeon: Myrene GalasHandy, Michael, MD;  [facility];  Service: Orthopedics;  Laterality: Left;  . ORIF TIBIA PLATEAU Left 02/24/2017   Procedure: Open Reduction Internal Fixation Tibial Plateau;  Surgeon: Myrene GalasHandy, Michael, MD;  [facility];  Service: Orthopedics;  Laterality: Left;  . PRIMARY CLOSURE  Right 04/27/2017   Procedure: PRIMARY CLOSURE;  Surgeon: [name], MD;  [facility];  Service: Orthopedics;  Laterality: Right;    There were no vitals filed for this visit.   Subjective Assessment - 09/05/17 1609    Subjective  Patient is a 62 year old female S/P Left tib fin fracture with multiple surgerys. She was in the hospital and rehab for over 5 months. she went home a few weeks ago. She has progressed herself off a walker and cane.      Limitations  Standing;Walking    How long can you stand comfortably?  long periods of time cuase her pain     How long can you walk comfortably?  becomes fatigued and has minor pain with minor pain     Diagnostic tests  Nothing recently     Patient Stated Goals  to get back to her exercises and active lifestyle     Currently in Pain?  Yes only when walking for longer distances    Pain Score  3     Pain Location  Knee    Pain Orientation  Left    Pain Descriptors / Indicators  Aching    Pain Type  Chronic pain    Pain Onset  More than a month ago    Pain Frequency  Constant    Aggravating Factors   standing and walking long distances     Pain Relieving Factors  rest     Effect of Pain on Daily Activities  Not back to active lifestyle          [facility] PT Assessment - 09/05/17 1156      Assessment   Medical Diagnosis  Left lower leg fracture     Referring Provider  Dr [name]     Onset Date/Surgical Date  02/22/17    Hand Dominance  Right      Precautions   Precautions  None      Restrictions   Weight Bearing Restrictions  No      Balance Screen   Has the patient fallen in the past 6 months  No    Has the patient had a decrease in activity level because of a fear of falling?   No    Is the patient reluctant to leave their home because of a fear of falling?   No      Home Environment   Additional Comments  Living with her mother at this time. Hopes to move back to her house. Has a few stairs into her front and back porch.       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    Leisure  Work out in her house. Bike riding       Cognition   Overall Cognitive Status  Within Functional Limits for tasks assessed    Attention  Focused    Focused Attention  Appears intact    Memory  Appears intact    Awareness  Appears intact    Problem Solving  Appears intact       Sensation   Light Touch  Appears Intact    Additional Comments  Parathesias on the inside of the lower leg       Coordination   Gross Motor Movements are Fluid and Coordinated  Yes    Fine Motor Movements are  Fluid and Coordinated  Yes      ROM / Strength   AROM / PROM / Strength  AROM;Strength;PROM      AROM   AROM Assessment Site  Knee;Hip;Ankle    Right/Left Ankle  Right    Right Ankle Dorsiflexion  5      PROM   Overall PROM Comments  8 degrees of passive dorsi flexion       Strength   Strength Assessment Site  Knee;Hip;Ankle    Right/Left Hip  Right    Right Hip Flexion  4/5    Right Hip ABduction  4+/5    Right/Left Knee  Right    Right Knee Flexion  4+/5    Right Knee Extension  4/5    Right/Left Ankle  Right    Right Ankle Plantar Flexion  3+/5      Palpation   Palpation comment  No unexpected tenderness to palpation      Ambulation/Gait   Gait Comments  decreased single leg stance time on the right; mild trunk flexion with gait              Objective measurements completed on examination: See above findings.      [facility] Adult PT Treatment/Exercise - 09/06/17 0001      Knee/Hip Exercises: Stretches   Passive Hamstring Stretch Limitations  3x20 sec hold     Gastroc Stretch Limitations  3x20 sec hold with strap; also shown wall stretch       Knee/Hip Exercises: Supine   Bridges Limitations  2x10     Straight Leg Raises Limitations  2x10     Other Supine Knee/Hip Exercises  supine clam green 2x10              PT Education - 09/06/17 0858    Education provided  Yes    Education Details  HEP; activity progression, symptom mangement     Person(s) Educated  Patient    Methods  Explanation;Demonstration;Tactile cues;Verbal cues    Comprehension  Verbalized understanding;Returned demonstration;Verbal cues required;Tactile cues required;Need further instruction       PT Short Term Goals - 09/06/17 0908      PT SHORT TERM GOAL #1   Title   Patient will demsotrate 5/5 gross right lower extremity strength     Time  4    Period  Weeks    Status  New    Target Date  10/04/17      PT SHORT TERM GOAL #2   Title  Patient will increase right lower extremity single leg stance time to 20 sec     Time  4    Period  Weeks    Status  New    Target Date  10/04/17      PT SHORT TERM GOAL #3   Title  Patient will be independent with intial HEP     Time  4    Period  Weeks    Status  New    Target Date  10/04/17        PT Long Term Goals - 09/06/17 0911      PT LONG TERM GOAL #1   Title  Patient will return to exercise activity with her equipment at home     Time  8    Period  Weeks    Status  New    Target Date  11/01/17      PT LONG TERM GOAL #2   Title  Patient will  report no pain with long distance walking     Time  8    Period  Weeks    Status  New    Target Date  11/01/17      PT LONG TERM GOAL #3   Title  Patient will demsotrate a 41% limitation on FOTO     Time  8    Period  Weeks    Status  New    Target Date  11/01/17             Plan - 09/05/17 1612    Clinical Impression Statement  Patient is a 62 year old fmeale S/P Tib fib fracture on 02/22/2017. She had several suregrys to repair her leg. She has been in rehab and recently home health. She has limitations in strength and range of motion in her ankle.  She has progressed off the walker. She has minor pain when she is up for too long. He has decreased single leg stance time on the left. She is very motivated to get back to working out and continue her progression with strength. She was seen for a low complexity evaluation.     History and Personal Factors relevant to plan of care:  Low back pain; Cervcal spine pain, restless leg syndrome ( only symptomatic after her injury)     Clinical Presentation  Stable    Clinical Presentation due to:  making steady progress with strength and mobility     Clinical Decision Making  Low    PT Frequency  2x /  week    PT Duration  8 weeks    PT Treatment/Interventions  ADLs/Self Care Home Management;Cryotherapy;Electrical Stimulation;Iontophoresis 4mg /ml Dexamethasone;Ultrasound;Gait training;Stair training;Therapeutic exercise;Therapeutic activities;Patient/family education;Neuromuscular re-education;Passive range of motion;Taping    PT Next Visit Plan  begin standing exercises; leg press; LAQ and hamstring curl with band; reviewe HEP. Patient bvery motiveted. Progress as tolerated. Consdier standing march.     PT Home Exercise Plan  bridge, clam; hamstring stretch; gastroc stretch; SLR     Consulted and Agree with Plan of Care  Patient       Patient will benefit from skilled therapeutic intervention in order to improve the following deficits and impairments:  Abnormal gait, Pain, Decreased range of motion, Decreased activity tolerance, Decreased endurance, Difficulty walking  Visit Diagnosis: Pain in right lower leg - Plan: PT plan of care cert/re-cert  Difficulty in walking, not elsewhere classified - Plan: PT plan of care cert/re-cert  Muscle weakness (generalized) - Plan: PT plan of care cert/re-cert     Problem List Patient Active Problem List   Diagnosis Date Noted  . 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 GERD (gastroesophageal reflux disease)   . Tibia fracture 06/06/2017  . 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 Traumatic open wound of left lower leg 04/27/2017  . Osteomyelitis of right tibia (HCC)   . Depression   . Osteomyelitis (HCC) 04/25/2017  . Acute blood loss anemia 04/08/2017  . GERD without esophagitis 04/08/2017  . Constipation due to opioid therapy 04/08/2017  . Anticoagulation adequate 04/08/2017  . S/P ORIF (open reduction internal fixation) fracture 03/26/2017  . Multiple fractures 03/26/2017  . Anemia 03/26/2017  . Urinary retention 03/26/2017  . Concussion with no loss of consciousness  03/26/2017  . Bilateral tibial fractures 02/22/2017  . MDD (major depressive disorder), recurrent severe, without psychosis (HCC) 01/26/2017  . INSOMNIA 01/26/2010  . ACTINIC KERATOSIS  08/28/2009    Dessie Coma PT DPT  09/06/2017, 9:40 AM  [facility] [address] [place], [place], [address] Phone: [phone]   Fax:  [phone]  Name: RYLEY TEATER MRN: [id number] Date of Birth: 1956-04-15

## 2017-09-07 ENCOUNTER — Ambulatory Visit: Payer: Medicare Other | Admitting: Physical Therapy

## 2017-09-07 ENCOUNTER — Telehealth: Payer: Self-pay | Admitting: Physical Therapy

## 2017-09-07 NOTE — Telephone Encounter (Signed)
Spoke with pt about today's missed appointment, and pt stated she didn't think she was starting her visits until the next week after her evaluation, otherwise she would have came today. Reminded pt of when her next appointment was scheduled and if she can't make it for any reason to call and we can cancel or reschedule.

## 2017-09-12 ENCOUNTER — Ambulatory Visit: Payer: Medicare Other | Admitting: Physical Therapy

## 2017-09-12 ENCOUNTER — Encounter: Payer: Self-pay | Admitting: Physical Therapy

## 2017-09-12 DIAGNOSIS — M79661 Pain in right lower leg: Secondary | ICD-10-CM

## 2017-09-12 DIAGNOSIS — M6281 Muscle weakness (generalized): Secondary | ICD-10-CM

## 2017-09-12 DIAGNOSIS — R262 Difficulty in walking, not elsewhere classified: Secondary | ICD-10-CM

## 2017-09-12 NOTE — Therapy (Signed)
[facility] [address] [place], [place], [address] Phone: [phone]   Fax:  [phone]  Physical Therapy Treatment  Patient Details  Name: Deborah [name] MRN: [id number] Date of Birth: 1956-07-25 Referring Provider: Dr Myrene GalasMichael Handy    Encounter Date: 09/12/2017  PT End of Session - 09/12/17 1154    Visit Number  2    Number of Visits  16    Date for PT Re-Evaluation  11/01/17    PT Start Time  1100    PT Stop Time  1144    PT Time Calculation (min)  44 min    Activity Tolerance  Patient tolerated treatment well    Behavior During Therapy  [facility] for tasks assessed/performed       Past Medical History:  Diagnosis Date  . Anemia    iron deficieny  . Depression   . GERD (gastroesophageal reflux disease)   . Insomnia   . Low back pain   . Muscle weakness   . Neck pain   . Osteomyelitis of right tibia (HCC)   . Restless leg syndrome   . Traumatic open wound of left lower leg 04/27/2017    Past Surgical History:  Procedure Laterality Date  . APPLICATION OF WOUND VAC Right 04/25/2017  . BONE EXCISION Right 04/25/2017   Procedure: PARTIAL EXCISION RIGHT TIBIA;  Surgeon: Myrene GalasHandy, Michael, MD;  [facility];  Service: Orthopedics;  Laterality: Right;  . EXTERNAL FIXATION LEG Bilateral 02/22/2017   Procedure: EXTERNAL FIXATION LEFT LOWER LEG;  Surgeon: Nadara Mustarduda, Marcus V, MD;  [facility];  Service: Orthopedics;  Laterality: Bilateral;  . EXTERNAL FIXATION REMOVAL Bilateral 02/28/2017   Procedure: REMOVAL EXTERNAL FIXATION LEG;  Surgeon: Myrene GalasHandy, Michael, MD;  [facility];  Service: Orthopedics;  Laterality: Bilateral;  . FACIAL LACERATION REPAIR N/A 02/22/2017   Procedure: FACIAL LACERATION REPAIR;  Surgeon: Nadara Mustarduda, Marcus V, MD;  [facility];  Service: Orthopedics;  Laterality: N/A;  . FEMUR SURGERY Left 06/07/2017   AUTOGRAFT HARVEST LEFT FEMUR, PLACEMENT OF BONE GRAFT LEFT TIBIA FRACTURE/notes 06/07/2017  .  FRACTURE SURGERY    . HARDWARE REMOVAL Right 04/25/2017   Procedure: HARDWARE REMOVAL RIGHT KNEE;  Surgeon: Myrene GalasHandy, Michael, MD;  [facility];  Service: Orthopedics;  Laterality: Right;  . I&D EXTREMITY Bilateral 02/22/2017   Procedure: IRRIGATION AND DEBRIDEMENT BILATERL LOWER EXTREMITIES;  Surgeon: Nadara Mustarduda, Marcus V, MD;  [facility];  Service: Orthopedics;  Laterality: Bilateral;  . I&D EXTREMITY Bilateral 02/24/2017   Procedure: BILATERAL TIBIAS DEBRIDEMENT AND PLACEMENT OF ANTIBIOTIC BEADS LEFT TIBIAS;  Surgeon: Myrene GalasHandy, Michael, MD;  [facility];  Service: Orthopedics;  Laterality: Bilateral;  . I&D EXTREMITY Right 04/27/2017   Procedure: IRRIGATION AND DEBRIDEMENT RIGHT LEG;  Surgeon: Myrene GalasHandy, Michael, MD;  [facility];  Service: Orthopedics;  Laterality: Right;  . ORIF TIBIA FRACTURE Bilateral 02/28/2017   Procedure: OPEN REDUCTION INTERNAL FIXATION (ORIF) TIBIA FRACTURE;  Surgeon: Myrene GalasHandy, Michael, MD;  [facility];  Service: Orthopedics;  Laterality: Bilateral;  . ORIF TIBIA FRACTURE Left 06/06/2017   Procedure: AUTOGRAFT HARVEST LEFT FEMUR, PLACEMENT OF BONE GRAFT LEFT TIBIA FRACTURE;  Surgeon: Myrene GalasHandy, Michael, MD;  [facility];  Service: Orthopedics;  Laterality: Left;  . ORIF TIBIA PLATEAU Left 02/24/2017   Procedure: Open Reduction Internal Fixation Tibial Plateau;  Surgeon: Myrene GalasHandy, Michael, MD;  [facility];  Service: Orthopedics;  Laterality: Left;  . PRIMARY CLOSURE Right 04/27/2017   Procedure: PRIMARY CLOSURE;  Surgeon: Carola FrostHandy,  Casimiro Needle, MD;  Location[facility];  Service: Orthopedics;  Laterality: Right;    There were no vitals filed for this visit.  Subjective Assessment - 09/12/17 1103    Subjective  "Since the last session, I have been doing fine, I still have some numbness in and hurts when I think about it. I was on my hands knees the other day"    Currently in Pain?  Yes    Pain Score  4     Pain Descriptors / Indicators  Sore    Pain Type  Chronic pain    Pain  Onset  More than a month ago    Pain Frequency  Intermittent    Aggravating Factors   standing/ wlaking     Pain Relieving Factors  rest                      [facility] Adult PT Treatment/Exercise - 09/12/17 1106      Knee/Hip Exercises: Stretches   Gastroc Stretch Limitations  PNF contract/ relax calf stretch 2 x 30 sec hold      Knee/Hip Exercises: Aerobic   Nustep  L 5 x 5 min LE only      Knee/Hip Exercises: Machines for Strengthening   Cybex Leg Press  20# 1 x 10 with bil LE, 2 x 10 with LLE only      Knee/Hip Exercises: Standing   Hip Abduction  2 sets;Both;Knee straight;Stengthening with red theraband    Hip Extension  Stengthening;2 sets;10 reps;Knee straight;Both with red theraband    Step Down  2 sets;10 reps;Step Height: 4" verbal cues to keep heel up    Gait Training  4 x 30 feet  with cues for heel strike and wider base to prevent cross over pattern      Knee/Hip Exercises: Seated   Sit to Sand  2 sets;10 reps;without UE support with green band around the knee for glute contraction      Manual Therapy   Manual Therapy  Joint mobilization    Joint Mobilization  ankle mobs grade 4 distraction and PA/AP grade 4 mobs               PT Short Term Goals - 09/06/17 0908      PT SHORT TERM GOAL #1   Title  Patient will demsotrate 5/5 gross right lower extremity strength     Time  4    Period  Weeks    Status  New    Target Date  10/04/17      PT SHORT TERM GOAL #2   Title  Patient will increase right lower extremity single leg stance time to 20 sec     Time  4    Period  Weeks    Status  New    Target Date  10/04/17      PT SHORT TERM GOAL #3   Title  Patient will be independent with intial HEP     Time  4    Period  Weeks    Status  New    Target Date  10/04/17        PT Long Term Goals - 09/06/17 0911      PT LONG TERM GOAL #1   Title  Patient will return to exercise activity with her equipment at home     Time  8    Period  Weeks     Status  New    Target Date  11/01/17      PT LONG TERM GOAL #2   Title  Patient will report no pain with long distance walking     Time  8    Period  Weeks    Status  New    Target Date  11/01/17      PT LONG TERM GOAL #3   Title  Patient will demsotrate a 41% limitation on FOTO     Time  8    Period  Weeks    Status  New    Target Date  11/01/17            Plan - 09/12/17 1155    Clinical Impression Statement  pt reports she is doing well and is consistent with her HEP. pt has limited DF which limits descending stairs, following mobilizations for the ankle she was able to descend 4 inch step without having her heel lift on the LLE. Strengthening to increased empahiss on glute med to prevent trendelenberg pattern which is causing cross over gait pattern Post session she reported feeling good and denies any pain.     PT Next Visit Plan  begin standing exercises; leg press; LAQ and hamstring curl with band; reviewe HEP. Patient bvery motiveted. Progress as tolerated. Consdier standing march/ hip hikes.     PT Home Exercise Plan  bridge, clam; hamstring stretch; gastroc stretch; SLR     Consulted and Agree with Plan of Care  Patient       Patient will benefit from skilled therapeutic intervention in order to improve the following deficits and impairments:  Abnormal gait, Pain, Decreased range of motion, Decreased activity tolerance, Decreased endurance, Difficulty walking  Visit Diagnosis: Pain in right lower leg  Difficulty in walking, not elsewhere classified  Muscle weakness (generalized)     Problem List Patient Active Problem List   Diagnosis Date Noted  . 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 GERD (gastroesophageal reflux disease)   . Tibia fracture 06/06/2017  . 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 Traumatic open wound of left lower leg 04/27/2017  . Osteomyelitis of right tibia (HCC)   .  Depression   . Osteomyelitis (HCC) 04/25/2017  . Acute blood loss anemia 04/08/2017  . GERD without esophagitis 04/08/2017  . Constipation due to opioid therapy 04/08/2017  . Anticoagulation adequate 04/08/2017  . S/P ORIF (open reduction internal fixation) fracture 03/26/2017  . Multiple fractures 03/26/2017  . Anemia 03/26/2017  . Urinary retention 03/26/2017  . Concussion with no loss of consciousness 03/26/2017  . Bilateral tibial fractures 02/22/2017  . MDD (major depressive disorder), recurrent severe, without psychosis (HCC) 01/26/2017  . INSOMNIA 01/26/2010  . ACTINIC KERATOSIS 08/28/2009   Deborah [name] PT, DPT, LAT, ATC  09/12/17  12:26 PM      [facility] [address] [place], [place], [address] Phone: [phone]   Fax:  [phone]  Name: Deborah [name] MRN: [id number] Date of Birth: 12/13/1955

## 2017-09-14 ENCOUNTER — Ambulatory Visit: Payer: Medicare Other | Admitting: Physical Therapy

## 2017-09-14 ENCOUNTER — Encounter: Payer: Self-pay | Admitting: Physical Therapy

## 2017-09-14 DIAGNOSIS — R262 Difficulty in walking, not elsewhere classified: Secondary | ICD-10-CM

## 2017-09-14 DIAGNOSIS — M79661 Pain in right lower leg: Secondary | ICD-10-CM

## 2017-09-14 DIAGNOSIS — M6281 Muscle weakness (generalized): Secondary | ICD-10-CM

## 2017-09-14 NOTE — Therapy (Signed)
[facility] Outpatient Rehabilitation [facility] [address] [place], [place], [address] Phone: [phone]   Fax:  [phone]  Physical Therapy Treatment  Patient Details  Name: Deborah [name] MRN: [id number] Date of Birth: [birth year] Referring Provider: Dr [name]    Encounter Date: 09/14/2017  PT End of Session - 09/14/17 1149    Visit Number  3    Number of Visits  16    Date for PT Re-Evaluation  11/01/17    Authorization Type  Medicare/ medicaid 2nd     PT Start Time  1101    PT Stop Time  1146    PT Time Calculation (min)  45 min    Activity Tolerance  Patient tolerated treatment well    Behavior During Therapy  [facility] for tasks assessed/performed       Past Medical History:  Diagnosis Date  . Anemia    iron deficieny  . Depression   . GERD (gastroesophageal reflux disease)   . Insomnia   . Low back pain   . Muscle weakness   . Neck pain   . Osteomyelitis of right tibia (HCC)   . Restless leg syndrome   . Traumatic open wound of left lower leg 04/27/2017    Past Surgical History:  Procedure Laterality Date  . APPLICATION OF WOUND VAC Right 04/25/2017  . BONE EXCISION Right 04/25/2017   Procedure: PARTIAL EXCISION RIGHT TIBIA;  Surgeon: [name], MD;  [facility]: [facility] OR;  Service: Orthopedics;  Laterality: Right;  . EXTERNAL FIXATION LEG Bilateral 02/22/2017   Procedure: EXTERNAL FIXATION LEFT LOWER LEG;  Surgeon: Nadara Mustard, MD;  Location: [facility] OR;  Service: Orthopedics;  Laterality: Bilateral;  . EXTERNAL FIXATION REMOVAL Bilateral 02/28/2017   Procedure: REMOVAL EXTERNAL FIXATION LEG;  Surgeon: [name], MD;  Location: [facility] OR;  Service: Orthopedics;  Laterality: Bilateral;  . FACIAL LACERATION REPAIR N/A 02/22/2017   Procedure: FACIAL LACERATION REPAIR;  Surgeon: Nadara Mustard, MD;  Location: [facility] OR;  Service: Orthopedics;  Laterality: N/A;  . FEMUR SURGERY Left 06/07/2017   AUTOGRAFT HARVEST LEFT FEMUR, PLACEMENT OF BONE  GRAFT LEFT TIBIA FRACTURE/notes 06/07/2017  . FRACTURE SURGERY    . HARDWARE REMOVAL Right 04/25/2017   Procedure: HARDWARE REMOVAL RIGHT KNEE;  Surgeon: [name], MD;  Location: [facility] OR;  Service: Orthopedics;  Laterality: Right;  . I&D EXTREMITY Bilateral 02/22/2017   Procedure: IRRIGATION AND DEBRIDEMENT BILATERL LOWER EXTREMITIES;  Surgeon: Nadara Mustard, MD;  Location: [facility] OR;  Service: Orthopedics;  Laterality: Bilateral;  . I&D EXTREMITY Bilateral 02/24/2017   Procedure: BILATERAL TIBIAS DEBRIDEMENT AND PLACEMENT OF ANTIBIOTIC BEADS LEFT TIBIAS;  Surgeon: [name], MD;  [facility]: [facility] OR;  Service: Orthopedics;  Laterality: Bilateral;  . I&D EXTREMITY Right 04/27/2017   Procedure: IRRIGATION AND DEBRIDEMENT RIGHT LEG;  Surgeon: [name], MD;  Location: [facility] OR;  Service: Orthopedics;  Laterality: Right;  . ORIF TIBIA FRACTURE Bilateral 02/28/2017   Procedure: OPEN REDUCTION INTERNAL FIXATION (ORIF) TIBIA FRACTURE;  Surgeon: [name], MD;  Location: [facility] OR;  Service: Orthopedics;  Laterality: Bilateral;  . ORIF TIBIA FRACTURE Left 06/06/2017   Procedure: AUTOGRAFT HARVEST LEFT FEMUR, PLACEMENT OF BONE GRAFT LEFT TIBIA FRACTURE;  Surgeon: [name], MD;  Location: [facility] OR;  Service: Orthopedics;  Laterality: Left;  . ORIF TIBIA PLATEAU Left 02/24/2017   Procedure: Open Reduction Internal Fixation Tibial Plateau;  Surgeon: [name], MD;  Location: [facility] OR;  Service: Orthopedics;  Laterality: Left;  . PRIMARY CLOSURE  Right 04/27/2017   Procedure: PRIMARY CLOSURE;  Surgeon: Myrene GalasHandy, Michael, MD;  [facility];  Service: Orthopedics;  Laterality: Right;    There were no vitals filed for this visit.  Subjective Assessment - 09/14/17 1059    Subjective  "I am doing pretty good,  a little sore in the ankle last time but it wasn't bad at all"    Currently in Pain?  Yes    Pain Score  4     Aggravating Factors   walking/ standing    Pain Relieving Factors  resting                       [facility] Adult PT Treatment/Exercise - 09/14/17 1105      Knee/Hip Exercises: Stretches   Gastroc Stretch Limitations  PNF contract/ relax calf stretch 2 x 30 sec hold      Knee/Hip Exercises: Aerobic   Elliptical  L2  x 5min elevation L1      Knee/Hip Exercises: Standing   Forward Lunges  2 sets;Both mini lunge touching down onto Bosu    Gait Training  4 x 30 feet  with arch tape on which she demonstrated improved alignment of the foot and had no cross over    Other Standing Knee Exercises  lateral band walks 2 x 12 with green theraband      Knee/Hip Exercises: Seated   Sit to Sand  2 sets;10 reps;without UE support      Manual Therapy   Manual Therapy  Taping    Joint Mobilization  ankle mobs grade 4 distraction and PA/AP grade 4 mobs    McConnell  arch taping on the RLE      Ankle Exercises: Seated   Other Seated Ankle Exercises  posterior tib strength 2 x 12 with red theraband             PT Education - 09/14/17 1148    Education provided  Yes    Education Details  benefits of arch taping and if it helps resolve pain and promote appropriate gait pattern that she may benefit from orthotic and discussed where she can find them.     Person(s) Educated  Patient    Methods  Explanation;Verbal cues    Comprehension  Verbalized understanding;Verbal cues required       PT Short Term Goals - 09/06/17 0908      PT SHORT TERM GOAL #1   Title  Patient will demsotrate 5/5 gross right lower extremity strength     Time  4    Period  Weeks    Status  New    Target Date  10/04/17      PT SHORT TERM GOAL #2   Title  Patient will increase right lower extremity single leg stance time to 20 sec     Time  4    Period  Weeks    Status  New    Target Date  10/04/17      PT SHORT TERM GOAL #3   Title  Patient will be independent with intial HEP     Time  4    Period  Weeks    Status  New    Target Date  10/04/17        PT Long Term Goals -  09/06/17 0911      PT LONG TERM GOAL #1   Title  Patient will return to exercise activity with her equipment at home  Time  8    Period  Weeks    Status  New    Target Date  11/01/17      PT LONG TERM GOAL #2   Title  Patient will report no pain with long distance walking     Time  8    Period  Weeks    Status  New    Target Date  11/01/17      PT LONG TERM GOAL #3   Title  Patient will demsotrate a 41% limitation on FOTO     Time  8    Period  Weeks    Status  New    Target Date  11/01/17            Plan - 09/14/17 1150    Clinical Impression Statement  Conitnued focus on ankle mobility due to pt report of pain located around the medial malleolus. Trialed arch taping which she reported greatly reduced her pain / soreness and assisted with reducing inversion of the foot. Continued working on hip/ quad strength which she performed well with no report of pain or soreness.     PT Next Visit Plan   leg press; LAQ and hamstring curl with band; standing hip/knee and ankle exericse exercises, arch taping if tolerated well.     PT Home Exercise Plan  bridge, clam; hamstring stretch; gastroc stretch; SLR     Consulted and Agree with Plan of Care  Patient       Patient will benefit from skilled therapeutic intervention in order to improve the following deficits and impairments:     Visit Diagnosis: Pain in right lower leg  Difficulty in walking, not elsewhere classified  Muscle weakness (generalized)     Problem List Patient Active Problem List   Diagnosis Date Noted  . 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 GERD (gastroesophageal reflux disease)   . Tibia fracture 06/06/2017  . 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 Traumatic open wound of left lower leg 04/27/2017  . Osteomyelitis of right tibia (HCC)   . Depression   . Osteomyelitis (HCC) 04/25/2017  . Acute blood loss anemia 04/08/2017  . GERD  without esophagitis 04/08/2017  . Constipation due to opioid therapy 04/08/2017  . Anticoagulation adequate 04/08/2017  . S/P ORIF (open reduction internal fixation) fracture 03/26/2017  . Multiple fractures 03/26/2017  . Anemia 03/26/2017  . Urinary retention 03/26/2017  . Concussion with no loss of consciousness 03/26/2017  . Bilateral tibial fractures 02/22/2017  . MDD (major depressive disorder), recurrent severe, without psychosis (HCC) 01/26/2017  . INSOMNIA 01/26/2010  . ACTINIC KERATOSIS 08/28/2009   Lulu Riding PT, DPT, LAT, ATC  09/14/17  11:55 AM      [facility] Outpatient Rehabilitation [facility] [address] [place], [place], [address] Phone: [phone]   Fax:  [phone]  Name: DARTHA ROZZELL MRN: [id number] Date of Birth: September 14, 1955

## 2017-09-19 ENCOUNTER — Encounter: Payer: Self-pay | Admitting: Neurology

## 2017-09-19 ENCOUNTER — Ambulatory Visit (INDEPENDENT_AMBULATORY_CARE_PROVIDER_SITE_OTHER): Payer: Medicare Other | Admitting: Neurology

## 2017-09-19 ENCOUNTER — Ambulatory Visit: Payer: Medicare Other | Admitting: Physical Therapy

## 2017-09-19 VITALS — BP 112/71 | HR 76 | Ht 65.0 in | Wt 141.0 lb

## 2017-09-19 DIAGNOSIS — M79661 Pain in right lower leg: Secondary | ICD-10-CM | POA: Diagnosis not present

## 2017-09-19 DIAGNOSIS — R32 Unspecified urinary incontinence: Secondary | ICD-10-CM | POA: Diagnosis not present

## 2017-09-19 DIAGNOSIS — R262 Difficulty in walking, not elsewhere classified: Secondary | ICD-10-CM

## 2017-09-19 DIAGNOSIS — M6281 Muscle weakness (generalized): Secondary | ICD-10-CM
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
## 2017-09-20 ENCOUNTER — Encounter: Payer: Self-pay | Admitting: Physical Therapy
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
## 2017-09-21 ENCOUNTER — Ambulatory Visit: Payer: Medicare Other | Admitting: Physical Therapy

## 2017-09-21 ENCOUNTER — Encounter: Payer: Self-pay | Admitting: Neurology

## 2017-09-26 ENCOUNTER — Encounter: Payer: Self-pay | Admitting: Physical Therapy

## 2017-09-26 ENCOUNTER — Ambulatory Visit: Payer: Medicare Other | Admitting: Physical Therapy

## 2017-09-26 DIAGNOSIS — M6281 Muscle weakness (generalized): Secondary | ICD-10-CM

## 2017-09-26 DIAGNOSIS — R262 Difficulty in walking, not elsewhere classified: Secondary | ICD-10-CM

## 2017-09-26 DIAGNOSIS — M79661 Pain in right lower leg: Secondary | ICD-10-CM
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
## 2017-09-28 ENCOUNTER — Ambulatory Visit: Payer: Medicare Other | Admitting: Physical Therapy

## 2017-09-28 ENCOUNTER — Encounter: Payer: Self-pay | Admitting: Physical Therapy

## 2017-09-28 DIAGNOSIS — M79661 Pain in right lower leg: Secondary | ICD-10-CM | POA: Diagnosis not present

## 2017-09-28 DIAGNOSIS — M6281 Muscle weakness (generalized): Secondary | ICD-10-CM

## 2017-09-28 DIAGNOSIS — R262 Difficulty in walking, not elsewhere classified: Secondary | ICD-10-CM
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
## 2017-09-30 ENCOUNTER — Other Ambulatory Visit: Payer: Self-pay

## 2017-10-03 ENCOUNTER — Encounter: Payer: Self-pay | Admitting: Physical Therapy

## 2017-10-03 ENCOUNTER — Ambulatory Visit: Payer: Medicare Other | Attending: Orthopedic Surgery | Admitting: Physical Therapy

## 2017-10-03 DIAGNOSIS — R262 Difficulty in walking, not elsewhere classified: Secondary | ICD-10-CM | POA: Insufficient documentation

## 2017-10-03 DIAGNOSIS — M6281 Muscle weakness (generalized): Secondary | ICD-10-CM

## 2017-10-03 DIAGNOSIS — M79661 Pain in right lower leg: Secondary | ICD-10-CM | POA: Insufficient documentation
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
## 2017-10-05 ENCOUNTER — Ambulatory Visit: Payer: Medicare Other | Admitting: Physical Therapy

## 2017-10-05 ENCOUNTER — Encounter: Payer: Self-pay | Admitting: Physical Therapy

## 2017-10-07 ENCOUNTER — Ambulatory Visit
Admission: RE | Admit: 2017-10-07 | Discharge: 2017-10-07 | Disposition: A | Discharge status: Home / Self Care | Payer: Medicare Other | Source: Ambulatory Visit | Attending: Neurology | Admitting: Neurology

## 2017-10-07 DIAGNOSIS — R32 Unspecified urinary incontinence: Secondary | ICD-10-CM

## 2017-10-10 ENCOUNTER — Encounter: Payer: Self-pay | Admitting: Physical Therapy

## 2017-10-10 ENCOUNTER — Ambulatory Visit: Payer: Medicare Other | Admitting: Physical Therapy

## 2017-10-10 ENCOUNTER — Telehealth: Payer: Self-pay | Admitting: Neurology

## 2017-10-10 DIAGNOSIS — M79661 Pain in right lower leg: Secondary | ICD-10-CM | POA: Diagnosis not present

## 2017-10-10 DIAGNOSIS — M6281 Muscle weakness (generalized): Secondary | ICD-10-CM

## 2017-10-10 DIAGNOSIS — R262 Difficulty in walking, not elsewhere classified: Secondary | ICD-10-CM

## 2017-10-10 NOTE — Telephone Encounter (Signed)
Spoke to patient - she is aware of results and will keep her pending appt for further review. 
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
## 2017-10-12 ENCOUNTER — Encounter: Payer: Self-pay | Admitting: Physical Therapy

## 2017-10-12 ENCOUNTER — Ambulatory Visit: Payer: Medicare Other | Admitting: Physical Therapy

## 2017-10-12 DIAGNOSIS — M79661 Pain in right lower leg: Secondary | ICD-10-CM

## 2017-10-12 DIAGNOSIS — R262 Difficulty in walking, not elsewhere classified: Secondary | ICD-10-CM

## 2017-10-12 DIAGNOSIS — M6281 Muscle weakness (generalized): Secondary | ICD-10-CM
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
## 2017-10-17 ENCOUNTER — Ambulatory Visit: Payer: Medicare Other | Admitting: Physical Therapy

## 2017-10-17 ENCOUNTER — Encounter: Payer: Self-pay | Admitting: Physical Therapy

## 2017-10-17 DIAGNOSIS — R262 Difficulty in walking, not elsewhere classified: Secondary | ICD-10-CM

## 2017-10-17 DIAGNOSIS — M6281 Muscle weakness (generalized): Secondary | ICD-10-CM

## 2017-10-17 DIAGNOSIS — M79661 Pain in right lower leg: Secondary | ICD-10-CM
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
## 2017-10-19 ENCOUNTER — Ambulatory Visit: Payer: Medicare Other | Admitting: Physical Therapy

## 2017-10-19 ENCOUNTER — Encounter: Payer: Self-pay | Admitting: Physical Therapy

## 2017-10-19 DIAGNOSIS — M79661 Pain in right lower leg: Secondary | ICD-10-CM | POA: Diagnosis not present

## 2017-10-19 DIAGNOSIS — R262 Difficulty in walking, not elsewhere classified: Secondary | ICD-10-CM

## 2017-10-19 DIAGNOSIS — M6281 Muscle weakness (generalized): Secondary | ICD-10-CM
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
## 2017-10-20 ENCOUNTER — Other Ambulatory Visit: Payer: Self-pay | Admitting: Urology

## 2017-10-24 ENCOUNTER — Ambulatory Visit: Payer: Medicare Other | Admitting: Physical Therapy

## 2017-10-24 ENCOUNTER — Encounter: Payer: Self-pay | Admitting: Physical Therapy

## 2017-10-24 DIAGNOSIS — M79661 Pain in right lower leg: Secondary | ICD-10-CM

## 2017-10-24 DIAGNOSIS — M6281 Muscle weakness (generalized): Secondary | ICD-10-CM

## 2017-10-24 DIAGNOSIS — R262 Difficulty in walking, not elsewhere classified: Secondary | ICD-10-CM

## 2017-10-25 ENCOUNTER — Encounter: Payer: Self-pay | Admitting: Physical Therapy
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
## 2017-10-26 ENCOUNTER — Encounter: Payer: Self-pay | Admitting: Physical Therapy

## 2017-10-26 ENCOUNTER — Ambulatory Visit: Payer: Medicare Other | Admitting: Physical Therapy

## 2017-10-26 DIAGNOSIS — M79661 Pain in right lower leg: Secondary | ICD-10-CM

## 2017-10-26 DIAGNOSIS — R262 Difficulty in walking, not elsewhere classified: Secondary | ICD-10-CM

## 2017-10-26 DIAGNOSIS — M6281 Muscle weakness (generalized): Secondary | ICD-10-CM

## 2017-10-27 ENCOUNTER — Encounter: Payer: Self-pay | Admitting: Physical Therapy
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
## 2017-10-30 ENCOUNTER — Encounter (HOSPITAL_BASED_OUTPATIENT_CLINIC_OR_DEPARTMENT_OTHER): Payer: Self-pay | Admitting: *Deleted

## 2017-10-31 ENCOUNTER — Encounter: Payer: Self-pay | Admitting: Physical Therapy

## 2017-11-02 ENCOUNTER — Encounter: Payer: Self-pay | Admitting: Physical Therapy

## 2017-11-02 ENCOUNTER — Encounter (HOSPITAL_BASED_OUTPATIENT_CLINIC_OR_DEPARTMENT_OTHER): Payer: Self-pay | Admitting: *Deleted

## 2017-11-03 ENCOUNTER — Encounter (HOSPITAL_BASED_OUTPATIENT_CLINIC_OR_DEPARTMENT_OTHER): Payer: Self-pay | Admitting: *Deleted

## 2017-11-03 ENCOUNTER — Other Ambulatory Visit: Payer: Self-pay

## 2017-11-03 NOTE — Progress Notes (Signed)
SPOKE W/ PT VIA PHONE FOR PRE-OP INTERVIEW.  NPO AFTER MN W/ EXCEPTION CLEAR LIQUIDS UNTIL 1015.  WILL TAKE WELLBUTRIN AM DOS W/ SIPS OF WATER AND TYLENOL IF NEEDED.

## 2017-11-06 ENCOUNTER — Ambulatory Visit (HOSPITAL_BASED_OUTPATIENT_CLINIC_OR_DEPARTMENT_OTHER): Payer: Medicare Other | Admitting: Certified Registered Nurse Anesthetist

## 2017-11-06 ENCOUNTER — Encounter (HOSPITAL_BASED_OUTPATIENT_CLINIC_OR_DEPARTMENT_OTHER)
Admission: RE | Disposition: A | Discharge status: Home / Self Care | Payer: Self-pay | Source: Ambulatory Visit | Attending: Urology

## 2017-11-06 ENCOUNTER — Encounter (HOSPITAL_BASED_OUTPATIENT_CLINIC_OR_DEPARTMENT_OTHER): Payer: Self-pay | Admitting: Certified Registered Nurse Anesthetist

## 2017-11-06 ENCOUNTER — Ambulatory Visit (HOSPITAL_BASED_OUTPATIENT_CLINIC_OR_DEPARTMENT_OTHER)
Admission: RE | Admit: 2017-11-06 | Discharge: 2017-11-06 | Disposition: A | Discharge status: Home / Self Care | Payer: Medicare Other | Source: Ambulatory Visit | Attending: Urology | Admitting: Urology

## 2017-11-06 DIAGNOSIS — F17210 Nicotine dependence, cigarettes, uncomplicated: Secondary | ICD-10-CM | POA: Diagnosis not present

## 2017-11-06 DIAGNOSIS — G2581 Restless legs syndrome: Secondary | ICD-10-CM | POA: Insufficient documentation

## 2017-11-06 DIAGNOSIS — N210 Calculus in bladder: Secondary | ICD-10-CM | POA: Insufficient documentation

## 2017-11-06 DIAGNOSIS — Z881 Allergy status to other antibiotic agents status: Secondary | ICD-10-CM | POA: Insufficient documentation

## 2017-11-06 DIAGNOSIS — F909 Attention-deficit hyperactivity disorder, unspecified type: Secondary | ICD-10-CM | POA: Diagnosis not present

## 2017-11-06 DIAGNOSIS — F329 Major depressive disorder, single episode, unspecified: Secondary | ICD-10-CM | POA: Insufficient documentation

## 2017-11-06 DIAGNOSIS — Z79899 Other long term (current) drug therapy: Secondary | ICD-10-CM | POA: Diagnosis not present

## 2017-11-06 HISTORY — DX: Anesthesia of skin: R20.0

## 2017-11-06 HISTORY — DX: Major depressive disorder, single episode, unspecified: F32.9

## 2017-11-06 HISTORY — PX: HOLMIUM LASER APPLICATION: SHX5852

## 2017-11-06 HISTORY — DX: Attention-deficit hyperactivity disorder, unspecified type: F90.9

## 2017-11-06 HISTORY — PX: CYSTOSCOPY WITH LITHOLAPAXY: SHX1425

## 2017-11-06 HISTORY — DX: Presence of spectacles and contact lenses: Z97.3

## 2017-11-06 HISTORY — DX: Pain in leg, unspecified: M79.606

## 2017-11-06 HISTORY — DX: Other chronic pain: G89.29

## 2017-11-06 HISTORY — DX: Personal history of other (healed) physical injury and trauma: Z87.828

## 2017-11-06 HISTORY — DX: Personal history of other diseases of the musculoskeletal system and connective tissue: Z87.39

## 2017-11-06 HISTORY — DX: Personal history of other specified conditions: Z87.898

## 2017-11-06 HISTORY — DX: Calculus in bladder: N21.0

## 2017-11-06 SURGERY — CYSTOSCOPY, WITH BLADDER CALCULUS LITHOLAPAXY
Anesthesia: General | Site: Bladder

## 2017-11-06 MED ORDER — LACTATED RINGERS IV SOLN
INTRAVENOUS | Status: DC
  Administered 2017-11-06: 11:00:00 via INTRAVENOUS
  Filled 2017-11-06: qty 1000

## 2017-11-06 MED ORDER — LIDOCAINE 2% (20 MG/ML) 5 ML SYRINGE
INTRAMUSCULAR | Status: AC
  Filled 2017-11-06: qty 5

## 2017-11-06 MED ORDER — PROPOFOL 10 MG/ML IV BOLUS
INTRAVENOUS | Status: AC
  Filled 2017-11-06: qty 20

## 2017-11-06 MED ORDER — FENTANYL CITRATE (PF) 100 MCG/2ML IJ SOLN
INTRAMUSCULAR | Status: DC | PRN
  Administered 2017-11-06: 50 ug via INTRAVENOUS
  Administered 2017-11-06 (×2): 25 ug via INTRAVENOUS

## 2017-11-06 MED ORDER — CEFAZOLIN SODIUM-DEXTROSE 2-4 GM/100ML-% IV SOLN
INTRAVENOUS | Status: AC
  Filled 2017-11-06: qty 100

## 2017-11-06 MED ORDER — SODIUM CHLORIDE 0.9 % IR SOLN
Status: DC | PRN
  Administered 2017-11-06: 3000 mL

## 2017-11-06 MED ORDER — OXYCODONE-ACETAMINOPHEN 5-325 MG PO TABS: 1.0000 | ORAL_TABLET | ORAL | 0 refills | Status: DC | PRN

## 2017-11-06 MED ORDER — DEXAMETHASONE SODIUM PHOSPHATE 10 MG/ML IJ SOLN
INTRAMUSCULAR | Status: DC | PRN
  Administered 2017-11-06: 10 mg via INTRAVENOUS

## 2017-11-06 MED ORDER — CEFAZOLIN SODIUM-DEXTROSE 2-4 GM/100ML-% IV SOLN
2.0000 g | INTRAVENOUS | Status: AC
  Administered 2017-11-06: 2 g via INTRAVENOUS
  Filled 2017-11-06: qty 100

## 2017-11-06 MED ORDER — LIDOCAINE 2% (20 MG/ML) 5 ML SYRINGE
INTRAMUSCULAR | Status: DC | PRN
  Administered 2017-11-06: 60 mg via INTRAVENOUS

## 2017-11-06 MED ORDER — MIDAZOLAM HCL 2 MG/2ML IJ SOLN
INTRAMUSCULAR | Status: AC
  Filled 2017-11-06: qty 2

## 2017-11-06 MED ORDER — ONDANSETRON HCL 4 MG/2ML IJ SOLN
INTRAMUSCULAR | Status: AC
  Filled 2017-11-06: qty 2

## 2017-11-06 MED ORDER — FENTANYL CITRATE (PF) 100 MCG/2ML IJ SOLN
INTRAMUSCULAR | Status: AC
  Filled 2017-11-06: qty 2

## 2017-11-06 MED ORDER — ONDANSETRON HCL 4 MG/2ML IJ SOLN
INTRAMUSCULAR | Status: DC | PRN
  Administered 2017-11-06: 4 mg via INTRAVENOUS

## 2017-11-06 MED ORDER — PROPOFOL 10 MG/ML IV BOLUS
INTRAVENOUS | Status: DC | PRN
  Administered 2017-11-06: 180 mg via INTRAVENOUS

## 2017-11-06 MED ORDER — MIDAZOLAM HCL 5 MG/5ML IJ SOLN
INTRAMUSCULAR | Status: DC | PRN
  Administered 2017-11-06: 2 mg via INTRAVENOUS

## 2017-11-06 MED ORDER — DEXAMETHASONE SODIUM PHOSPHATE 10 MG/ML IJ SOLN
INTRAMUSCULAR | Status: AC
  Filled 2017-11-06: qty 1

## 2017-11-06 SURGICAL SUPPLY — 12 items
BAG DRAIN URO-CYSTO SKYTR STRL (DRAIN) ×3 IMPLANT
BAG DRN UROCATH (DRAIN) ×1
CLOTH BEACON ORANGE TIMEOUT ST (SAFETY) ×2 IMPLANT
FIBER LASER FLEXIVA 1000 (UROLOGICAL SUPPLIES) ×1 IMPLANT
GLOVE BIO SURGEON STRL SZ8 (GLOVE) ×2 IMPLANT
GOWN STRL REUS W/TWL LRG LVL3 (GOWN DISPOSABLE) ×2 IMPLANT
GOWN STRL REUS W/TWL XL LVL3 (GOWN DISPOSABLE) ×2 IMPLANT
IV NS IRRIG 3000ML ARTHROMATIC (IV SOLUTION) ×2 IMPLANT
KIT TURNOVER CYSTO (KITS) ×2 IMPLANT
MANIFOLD NEPTUNE II (INSTRUMENTS) ×1 IMPLANT
PACK CYSTO (CUSTOM PROCEDURE TRAY) ×2 IMPLANT
TUBE CONNECTING 12X1/4 (SUCTIONS) ×1 IMPLANT

## 2017-11-06 NOTE — Anesthesia Procedure Notes (Signed)
Procedure Name: LMA Insertion Date/Time: 11/06/2017 12:14 PM Performed by: Pearson Grippeobertson, Soley Harriss M, CRNA Pre-anesthesia Checklist: Patient identified, Emergency Drugs available, Suction available and Patient being monitored Patient Re-evaluated:Patient Re-evaluated prior to induction Oxygen Delivery Method: Circle system utilized Preoxygenation: Pre-oxygenation with 100% oxygen Induction Type: IV induction Ventilation: Mask ventilation without difficulty LMA: LMA inserted LMA Size: 4.0 Number of attempts: 1 Airway Equipment and Method: Bite block Placement Confirmation: positive ETCO2 Tube secured with: Tape Dental Injury: Teeth and Oropharynx as per pre-operative assessment
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
## 2017-11-06 NOTE — Op Note (Signed)
Preoperative diagnosis: bladder calculus  Postoperative diagnosis: same  Procedure: 1 cystoscopy 2. cystolithalopaxy for a stone less than 2.5cm  Attending: Wilkie AyePatrick Almeda Ezra  Anesthesia: General  Estimated blood loss: Minimal  Drains: none  Specimens: 1. Bladder calculus  Antibiotics: ancef  Findings:  Ureteral orifices in normal anatomic location.  2cm bladder calculus  Indications: Patient is a 62 year old female with a history of bladder calculus.  After discussing treatment options, they decided proceed with cystolithalopaxy.  Procedure her in detail: The patient was brought to the operating room and a brief timeout was done to ensure correct patient, correct procedure, correct site.  General anesthesia was administered patient was placed in dorsal lithotomy position.  Their genitalia was then prepped and draped in usual sterile fashion.  A rigid 22 French cystoscope was passed in the urethra and the bladder.  Bladder was inspected and we noted no masses or lesions.  the ureteral orifices were in the normal orthotopic locations. Using the 1000nm laser fiber the bladder calculus was fragmented and the fragments were then irrigated from the bladder. The stone fragments were the sent for analysis. The bladder was then drained and this concluded the procedure which was well tolerated by patient.  Complications: None  Condition: Stable, extubated, transferred to PACU  Plan: Patient is to be discharge home. She will followup in 2 weeks for stone analysis discussion

## 2017-11-06 NOTE — Discharge Instructions (Signed)
Post Anesthesia Home Care Instructions  Activity: Get plenty of rest for the remainder of the day. A responsible individual must stay with you for 24 hours following the procedure.  For the next 24 hours, DO NOT: -Drive a car -Advertising copywriter -Drink alcoholic beverages -Take any medication unless instructed by your physician -Make any legal decisions or sign important papers.  Meals: Start with liquid foods such as gelatin or soup. Progress to regular foods as tolerated. Avoid greasy, spicy, heavy foods. If nausea and/or vomiting occur, drink only clear liquids until the nausea and/or vomiting subsides. Call your physician if vomiting continues.  Special Instructions/Symptoms: Your throat may feel dry or sore from the anesthesia or the breathing tube placed in your throat during surgery. If this causes discomfort, gargle with warm salt water. The discomfort should disappear within 24 hours.  Bladder Stone A bladder stone is a buildup of crystals made from the proteins and minerals found in urine. These substances build up when your urine becomes too concentrated. Bladder stones usually develop when you have another medical condition that prevents your bladder from emptying completely. Crystals can form in the small amount of urine left in your bladder. Bladder stones that grow large can become painful and block the flow of urine. What are the causes? Bladder stones can be caused by:  An enlarged prostate, which prevents the bladder from emptying well.  A urinary tract infection (UTI).  A weak spot in the bladder that creates a small pouch (bladder diverticulum).  Nerve damage that may interfere with the messages from your brain to your bladder muscles (neurogenic bladder). This can result from conditions such as Parkinson disease or spinal cord injuries.  What increases the risk? This condition is more likely to develop in people who:  Get frequent UTIs.  Have another medical  condition that affects their bladder.  Have a history of bladder surgery.  Have a spinal cord injury.  Have an abnormally shaped bladder (deformity).  What are the signs or symptoms? Small bladder stones do not always cause symptoms. Larger stones can cause symptoms that include:  Abdominal pain.  A frequent need to urinate.  Difficulty urinating.  Painful urination.  Blood in the urine.  Cloudy or dark colored urine.  Pain in the penis or testicles for men.  How is this diagnosed? This condition is diagnosed based on your symptoms, medical history, and a physical exam. The exam will include checking for abdominal tenderness. For men, a rectal exam may be done to check the prostate gland. You may also have other tests, such as:  A urine test (urinalysis) to find out more about your condition.  A urine sample test to check for other infections (culture).  Blood tests, including tests to look for a substance called creatinine. A creatinine level that is higher than normal could indicate a blockage.  A procedure to examine the inside of your bladder using a thin scope with a tiny lighted camera (cystoscopy) inserted through the urethra.  You may also have imaging studies such as:  A CT scan of your abdomen and pelvis to look for a stone and check whether it is blocking the flow of urine.  An X-ray of your kidneys, ureters, bladder, and urethra after you have a type of dye (contrast material) injected into your veins (intravenous pyelogram or IVP).  An abdominal and pelvic ultrasound to locate bladder stones and identify areas where urine flow is blocked.  How is this treated? Small bladder  stones do not require treatment. They can pass out of your body on their own. You may be instructed to drink extra water to help the stone pass through the bladder. Larger stones may need to be removed with one of the following procedures:  Cystolitholapaxy. A cystoscope is inserted  through the urethra and into the bladder to view the stone. A laser, ultrasound, or other device is used to break the stone into smaller pieces. Fluids are used to flush the small pieces from the area.  Surgical removal. You may need surgery to remove the stone if it is large and causing pain. A small incision is made in the bladder to directly remove the stone.  If the stone blocks the flow of urine, you may have a thin, flexible tube (stent) threaded into your ureter. The stent may be left in place after removal of a stone to ensure flow of urine until healing is complete.  Follow these instructions at home:  Drink enough fluid to keep your urine clear or pale yellow.  Report unusual urinary symptoms to your health care provider. Early diagnosis of an enlarged prostate and other bladder conditions may reduce your chance of getting bladder stones.  Avoid smoking and illegal drug use. Contact a health care provider if:  You have a fever.  You feel nauseous or vomit.  You are unable to urinate.  You have a large amount of blood in your urine. Get help right away if:  You have severe back pain or lower abdominal pain.  You are vomiting and cannot keep down any medicines or water. This information is not intended to replace advice given to you by your health care provider. Make sure you discuss any questions you have with your health care provider. Document Released: 08/02/2015 Document Revised: 12/25/2015 Document Reviewed: 08/02/2015 Elsevier Interactive Patient Education  [employer]2018 [employer]

## 2017-11-06 NOTE — Anesthesia Preprocedure Evaluation (Signed)
Anesthesia Evaluation  Patient identified by MRN, date of birth, ID band Patient awake    Reviewed: Allergy & Precautions, NPO status , Patient's Chart, lab work & pertinent test results  Airway Mallampati: II  TM Distance: >3 FB Neck ROM: Full    Dental no notable dental hx.    Pulmonary Current Smoker,    Pulmonary exam normal breath sounds clear to auscultation       Cardiovascular negative cardio ROS Normal cardiovascular exam Rhythm:Regular Rate:Normal     Neuro/Psych Depression negative neurological ROS     GI/Hepatic negative GI ROS, Neg liver ROS,   Endo/Other  negative endocrine ROS  Renal/GU negative Renal ROS  negative genitourinary   Musculoskeletal negative musculoskeletal ROS (+)   Abdominal   Peds negative pediatric ROS (+)  Hematology negative hematology ROS (+)   Anesthesia Other Findings   Reproductive/Obstetrics negative OB ROS                             Anesthesia Physical Anesthesia Plan  ASA: II  Anesthesia Plan: General   Post-op Pain Management:    Induction: Intravenous  PONV Risk Score and Plan: 2 and Ondansetron, Dexamethasone and Treatment may vary due to age or medical condition  Airway Management Planned: LMA  Additional Equipment:   Intra-op Plan:   Post-operative Plan:   Informed Consent: I have reviewed the patients History and Physical, chart, labs and discussed the procedure including the risks, benefits and alternatives for the proposed anesthesia with the patient or authorized representative who has indicated his/her understanding and acceptance.   Dental advisory given  Plan Discussed with: CRNA and Surgeon  Anesthesia Plan Comments:         Anesthesia Quick Evaluation

## 2017-11-06 NOTE — Anesthesia Postprocedure Evaluation (Signed)
Anesthesia Post Note  Patient: Darrick GrinderBernadette M Joost  Procedure(s) Performed: CYSTOSCOPY WITH LITHOLAPAXY (N/A Bladder) HOLMIUM LASER APPLICATION (N/A Bladder)     Patient location during evaluation: PACU Anesthesia Type: General Level of consciousness: awake and alert Pain management: pain level controlled Vital Signs Assessment: post-procedure vital signs reviewed and stable Respiratory status: spontaneous breathing, nonlabored ventilation and respiratory function stable Cardiovascular status: blood pressure returned to baseline and stable Postop Assessment: no apparent nausea or vomiting Anesthetic complications: no    Last Vitals:  Vitals:   11/06/17 1315 11/06/17 1353  BP: 115/87 117/69  Pulse: 78 80  Resp: 19 17  Temp:  36.7 C  SpO2: 100% 98%    Last Pain:  Vitals:   11/06/17 1353  TempSrc: Oral  PainSc:                  Lowella CurbWarren Ray Jamisha Hoeschen

## 2017-11-06 NOTE — Transfer of Care (Signed)
Immediate Anesthesia Transfer of Care Note  Patient: Deborah [name]  Procedure(s) Performed: CYSTOSCOPY WITH LITHOLAPAXY (N/A Bladder) HOLMIUM LASER APPLICATION (N/A Bladder)  Patient Location: PACU  Anesthesia Type:General  Level of Consciousness: awake, alert  and oriented  Airway & Oxygen Therapy: Patient Spontanous Breathing and Patient connected to nasal cannula oxygen  Post-op Assessment: Report given to RN and Post -op Vital signs reviewed and stable  Post vital signs: Reviewed and stable  Last Vitals:  Vitals Value Taken Time  BP 112/70 11/06/2017 12:45 PM  Temp    Pulse 86 11/06/2017 12:47 PM  Resp 18 11/06/2017 12:47 PM  SpO2 100 % 11/06/2017 12:47 PM  Vitals shown include unvalidated device data.  Last Pain:  Vitals:   11/06/17 1050  TempSrc:   PainSc: 5       Patients Stated Pain Goal: 4 (11/06/17 1050)  Complications: No apparent anesthesia complications

## 2017-11-07 ENCOUNTER — Ambulatory Visit: Payer: Medicare Other | Admitting: Physical Therapy

## 2017-11-07 ENCOUNTER — Encounter: Payer: Self-pay | Admitting: Physical Therapy

## 2017-11-07 ENCOUNTER — Encounter (HOSPITAL_BASED_OUTPATIENT_CLINIC_OR_DEPARTMENT_OTHER): Payer: Self-pay | Admitting: Urology

## 2017-11-09 ENCOUNTER — Ambulatory Visit: Payer: Medicare Other | Attending: Orthopedic Surgery | Admitting: Physical Therapy

## 2017-11-09 ENCOUNTER — Encounter: Payer: Self-pay | Admitting: Physical Therapy

## 2017-11-09 DIAGNOSIS — R262 Difficulty in walking, not elsewhere classified: Secondary | ICD-10-CM | POA: Insufficient documentation

## 2017-11-09 DIAGNOSIS — M6281 Muscle weakness (generalized): Secondary | ICD-10-CM | POA: Insufficient documentation

## 2017-11-09 DIAGNOSIS — M79661 Pain in right lower leg: Secondary | ICD-10-CM | POA: Insufficient documentation
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
## 2017-11-14 ENCOUNTER — Ambulatory Visit: Payer: Medicare Other | Admitting: Physical Therapy

## 2017-11-16 ENCOUNTER — Ambulatory Visit: Payer: Medicare Other | Admitting: Physical Therapy

## 2017-11-21 ENCOUNTER — Ambulatory Visit: Payer: Medicare Other | Admitting: Physical Therapy

## 2017-11-23 ENCOUNTER — Encounter: Payer: Self-pay | Admitting: Physical Therapy

## 2017-12-27 ENCOUNTER — Encounter: Payer: Self-pay | Admitting: Neurology

## 2017-12-27 ENCOUNTER — Telehealth: Payer: Self-pay | Admitting: *Deleted

## 2017-12-27 ENCOUNTER — Ambulatory Visit (INDEPENDENT_AMBULATORY_CARE_PROVIDER_SITE_OTHER): Payer: Medicare Other | Admitting: Neurology

## 2017-12-27 VITALS — BP 120/78 | HR 73 | Ht 64.0 in | Wt 123.0 lb

## 2017-12-27 DIAGNOSIS — S0990XS Unspecified injury of head, sequela: Secondary | ICD-10-CM

## 2017-12-27 DIAGNOSIS — S0990XA Unspecified injury of head, initial encounter: Secondary | ICD-10-CM | POA: Insufficient documentation

## 2017-12-27 NOTE — Telephone Encounter (Signed)
No showed follow up appointment. 
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
## 2018-01-11 ENCOUNTER — Telehealth: Payer: Self-pay | Admitting: *Deleted

## 2018-01-11 NOTE — Telephone Encounter (Signed)
Pt medical records to pt on 01/12/18

## 2018-03-12 DIAGNOSIS — M21619 Bunion of unspecified foot: Secondary | ICD-10-CM | POA: Insufficient documentation

## 2018-03-14 ENCOUNTER — Other Ambulatory Visit: Payer: Self-pay | Admitting: Orthopedic Surgery

## 2018-03-14 DIAGNOSIS — M216X2 Other acquired deformities of left foot: Secondary | ICD-10-CM

## 2018-03-14 DIAGNOSIS — S82252R Displaced comminuted fracture of shaft of left tibia, subsequent encounter for open fracture type IIIA, IIIB, or IIIC with malunion: Secondary | ICD-10-CM

## 2018-03-23 ENCOUNTER — Other Ambulatory Visit (HOSPITAL_COMMUNITY): Payer: Self-pay | Admitting: Orthopedic Surgery

## 2018-04-03 ENCOUNTER — Other Ambulatory Visit: Payer: Self-pay

## 2018-04-12 ENCOUNTER — Ambulatory Visit
Admission: RE | Admit: 2018-04-12 | Discharge: 2018-04-12 | Disposition: A | Discharge status: Home / Self Care | Payer: Medicare Other | Source: Ambulatory Visit | Attending: Orthopedic Surgery | Admitting: Orthopedic Surgery

## 2018-04-12 DIAGNOSIS — M216X2 Other acquired deformities of left foot: Secondary | ICD-10-CM

## 2018-04-13 ENCOUNTER — Ambulatory Visit
Admission: RE | Admit: 2018-04-13 | Discharge: 2018-04-13 | Disposition: A | Discharge status: Home / Self Care | Payer: Medicare Other | Source: Ambulatory Visit | Attending: Orthopedic Surgery | Admitting: Orthopedic Surgery

## 2018-04-13 ENCOUNTER — Other Ambulatory Visit: Payer: Self-pay | Admitting: Orthopedic Surgery

## 2018-04-13 DIAGNOSIS — M419 Scoliosis, unspecified: Secondary | ICD-10-CM
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
## 2018-04-19 ENCOUNTER — Other Ambulatory Visit: Payer: Self-pay | Admitting: Orthopedic Surgery

## 2018-04-19 DIAGNOSIS — M216X2 Other acquired deformities of left foot: Secondary | ICD-10-CM

## 2018-04-19 DIAGNOSIS — S82252R Displaced comminuted fracture of shaft of left tibia, subsequent encounter for open fracture type IIIA, IIIB, or IIIC with malunion: Secondary | ICD-10-CM

## 2018-04-20 ENCOUNTER — Other Ambulatory Visit: Payer: Self-pay | Admitting: Orthopedic Surgery

## 2018-04-20 DIAGNOSIS — T148XXA Other injury of unspecified body region, initial encounter: Secondary | ICD-10-CM

## 2018-04-24 ENCOUNTER — Inpatient Hospital Stay: Admission: RE | Admit: 2018-04-24 | Payer: Self-pay | Source: Ambulatory Visit

## 2018-04-27 ENCOUNTER — Ambulatory Visit
Admission: RE | Admit: 2018-04-27 | Discharge: 2018-04-27 | Disposition: A | Discharge status: Home / Self Care | Payer: Medicare Other | Source: Ambulatory Visit | Attending: Orthopedic Surgery | Admitting: Orthopedic Surgery

## 2018-04-27 DIAGNOSIS — T148XXA Other injury of unspecified body region, initial encounter: Secondary | ICD-10-CM

## 2018-04-27 DIAGNOSIS — M216X2 Other acquired deformities of left foot: Secondary | ICD-10-CM

## 2018-04-27 DIAGNOSIS — S82252R Displaced comminuted fracture of shaft of left tibia, subsequent encounter for open fracture type IIIA, IIIB, or IIIC with malunion: Secondary | ICD-10-CM

## 2018-05-21 ENCOUNTER — Encounter (HOSPITAL_BASED_OUTPATIENT_CLINIC_OR_DEPARTMENT_OTHER): Payer: Self-pay | Admitting: *Deleted

## 2018-05-21 ENCOUNTER — Other Ambulatory Visit: Payer: Self-pay

## 2018-05-24 ENCOUNTER — Encounter (HOSPITAL_BASED_OUTPATIENT_CLINIC_OR_DEPARTMENT_OTHER): Payer: Self-pay | Admitting: *Deleted

## 2018-05-24 ENCOUNTER — Encounter (HOSPITAL_BASED_OUTPATIENT_CLINIC_OR_DEPARTMENT_OTHER)
Admission: RE | Disposition: A | Discharge status: Home / Self Care | Payer: Self-pay | Source: Ambulatory Visit | Attending: Orthopedic Surgery

## 2018-05-24 ENCOUNTER — Ambulatory Visit (HOSPITAL_BASED_OUTPATIENT_CLINIC_OR_DEPARTMENT_OTHER): Payer: Medicare Other | Admitting: Anesthesiology

## 2018-05-24 ENCOUNTER — Ambulatory Visit (HOSPITAL_BASED_OUTPATIENT_CLINIC_OR_DEPARTMENT_OTHER)
Admission: RE | Admit: 2018-05-24 | Discharge: 2018-05-24 | Disposition: A | Discharge status: Home / Self Care | Payer: Medicare Other | Source: Ambulatory Visit | Attending: Orthopedic Surgery | Admitting: Orthopedic Surgery

## 2018-05-24 ENCOUNTER — Other Ambulatory Visit: Payer: Self-pay

## 2018-05-24 DIAGNOSIS — M21611 Bunion of right foot: Secondary | ICD-10-CM | POA: Insufficient documentation

## 2018-05-24 DIAGNOSIS — F909 Attention-deficit hyperactivity disorder, unspecified type: Secondary | ICD-10-CM | POA: Insufficient documentation

## 2018-05-24 DIAGNOSIS — F17210 Nicotine dependence, cigarettes, uncomplicated: Secondary | ICD-10-CM | POA: Insufficient documentation

## 2018-05-24 DIAGNOSIS — Z79891 Long term (current) use of opiate analgesic: Secondary | ICD-10-CM | POA: Insufficient documentation

## 2018-05-24 DIAGNOSIS — G8929 Other chronic pain: Secondary | ICD-10-CM | POA: Diagnosis not present

## 2018-05-24 DIAGNOSIS — Z79899 Other long term (current) drug therapy: Secondary | ICD-10-CM | POA: Diagnosis not present

## 2018-05-24 DIAGNOSIS — F329 Major depressive disorder, single episode, unspecified: Secondary | ICD-10-CM | POA: Insufficient documentation

## 2018-05-24 DIAGNOSIS — M2011 Hallux valgus (acquired), right foot: Secondary | ICD-10-CM | POA: Insufficient documentation

## 2018-05-24 DIAGNOSIS — G4700 Insomnia, unspecified: Secondary | ICD-10-CM | POA: Diagnosis not present

## 2018-05-24 HISTORY — PX: BUNIONECTOMY: SHX129

## 2018-05-24 SURGERY — BUNIONECTOMY
Anesthesia: General | Site: Foot | Laterality: Right

## 2018-05-24 MED ORDER — DEXAMETHASONE SODIUM PHOSPHATE 10 MG/ML IJ SOLN
INTRAMUSCULAR | Status: DC | PRN
  Administered 2018-05-24: 10 mg via INTRAVENOUS

## 2018-05-24 MED ORDER — CEFAZOLIN SODIUM-DEXTROSE 2-4 GM/100ML-% IV SOLN
2.0000 g | INTRAVENOUS | Status: AC
  Administered 2018-05-24: 2 g via INTRAVENOUS

## 2018-05-24 MED ORDER — CHLORHEXIDINE GLUCONATE 4 % EX LIQD: 60.0000 mL | Freq: Once | CUTANEOUS | Status: DC

## 2018-05-24 MED ORDER — OXYCODONE HCL 5 MG/5ML PO SOLN: 5.0000 mg | Freq: Once | ORAL | Status: DC | PRN

## 2018-05-24 MED ORDER — FENTANYL CITRATE (PF) 100 MCG/2ML IJ SOLN: 25.0000 ug | INTRAMUSCULAR | Status: DC | PRN

## 2018-05-24 MED ORDER — MIDAZOLAM HCL 2 MG/2ML IJ SOLN
INTRAMUSCULAR | Status: AC
  Filled 2018-05-24: qty 2

## 2018-05-24 MED ORDER — KETOROLAC TROMETHAMINE 10 MG PO TABS: 10.0000 mg | ORAL_TABLET | Freq: Four times a day (QID) | ORAL | 0 refills | Status: DC | PRN

## 2018-05-24 MED ORDER — FENTANYL CITRATE (PF) 100 MCG/2ML IJ SOLN
50.0000 ug | INTRAMUSCULAR | Status: DC | PRN
  Administered 2018-05-24: 100 ug via INTRAVENOUS

## 2018-05-24 MED ORDER — 0.9 % SODIUM CHLORIDE (POUR BTL) OPTIME
TOPICAL | Status: DC | PRN
  Administered 2018-05-24: 300 mL

## 2018-05-24 MED ORDER — POTASSIUM CHLORIDE IN NACL 40-0.9 MEQ/L-% IV SOLN: INTRAVENOUS | Status: DC

## 2018-05-24 MED ORDER — LACTATED RINGERS IV SOLN
INTRAVENOUS | Status: DC
  Administered 2018-05-24: 12:00:00 via INTRAVENOUS

## 2018-05-24 MED ORDER — MIDAZOLAM HCL 2 MG/2ML IJ SOLN
1.0000 mg | INTRAMUSCULAR | Status: DC | PRN
  Administered 2018-05-24: 2 mg via INTRAVENOUS

## 2018-05-24 MED ORDER — ONDANSETRON HCL 4 MG/2ML IJ SOLN: 4.0000 mg | Freq: Once | INTRAMUSCULAR | Status: DC | PRN

## 2018-05-24 MED ORDER — ACETAMINOPHEN 160 MG/5ML PO SOLN: 325.0000 mg | ORAL | Status: DC | PRN

## 2018-05-24 MED ORDER — ROPIVACAINE HCL 7.5 MG/ML IJ SOLN
INTRAMUSCULAR | Status: DC | PRN
  Administered 2018-05-24: 25 mL via PERINEURAL

## 2018-05-24 MED ORDER — ACETAMINOPHEN 325 MG PO TABS: 325.0000 mg | ORAL_TABLET | ORAL | Status: DC | PRN

## 2018-05-24 MED ORDER — MEPERIDINE HCL 25 MG/ML IJ SOLN: 6.2500 mg | INTRAMUSCULAR | Status: DC | PRN

## 2018-05-24 MED ORDER — PROPOFOL 10 MG/ML IV BOLUS
INTRAVENOUS | Status: DC | PRN
  Administered 2018-05-24: 100 mg via INTRAVENOUS

## 2018-05-24 MED ORDER — OXYCODONE HCL 5 MG PO TABS: 5.0000 mg | ORAL_TABLET | Freq: Once | ORAL | Status: DC | PRN

## 2018-05-24 MED ORDER — SCOPOLAMINE 1 MG/3DAYS TD PT72: 1.0000 | MEDICATED_PATCH | Freq: Once | TRANSDERMAL | Status: DC | PRN

## 2018-05-24 MED ORDER — VANCOMYCIN HCL 500 MG IV SOLR
INTRAVENOUS | Status: DC | PRN
  Administered 2018-05-24: 500 mg via TOPICAL

## 2018-05-24 MED ORDER — FENTANYL CITRATE (PF) 100 MCG/2ML IJ SOLN
INTRAMUSCULAR | Status: AC
  Filled 2018-05-24: qty 2

## 2018-05-24 SURGICAL SUPPLY — 76 items
BANDAGE ESMARK 6X9 LF (GAUZE/BANDAGES/DRESSINGS) IMPLANT
BIT DRILL 1.5X30 QC DISP (BIT) ×1 IMPLANT
BLADE AVERAGE 25X9 (BLADE) IMPLANT
BLADE LONG MED 25X9 (BLADE) IMPLANT
BLADE MICRO SAGITTAL (BLADE) IMPLANT
BLADE MINI RND TIP GREEN BEAV (BLADE) IMPLANT
BLADE OSC/SAG .038X5.5 CUT EDG (BLADE) IMPLANT
BLADE SURG 15 STRL LF DISP TIS (BLADE) ×3 IMPLANT
BLADE SURG 15 STRL SS (BLADE) ×6
BNDG CMPR 9X6 STRL LF SNTH (GAUZE/BANDAGES/DRESSINGS)
BNDG COHESIVE 4X5 TAN STRL (GAUZE/BANDAGES/DRESSINGS) ×2 IMPLANT
BNDG COHESIVE 6X5 TAN STRL LF (GAUZE/BANDAGES/DRESSINGS) IMPLANT
BNDG CONFORM 2 STRL LF (GAUZE/BANDAGES/DRESSINGS) ×1 IMPLANT
BNDG CONFORM 3 STRL LF (GAUZE/BANDAGES/DRESSINGS) ×1 IMPLANT
BNDG ESMARK 6X9 LF (GAUZE/BANDAGES/DRESSINGS)
CHLORAPREP W/TINT 26ML (MISCELLANEOUS) ×2 IMPLANT
COVER BACK TABLE 60X90IN (DRAPES) ×2 IMPLANT
COVER WAND RF STERILE (DRAPES) IMPLANT
CUFF TOURNIQUET SINGLE 24IN (TOURNIQUET CUFF) ×1 IMPLANT
CUFF TOURNIQUET SINGLE 34IN LL (TOURNIQUET CUFF) IMPLANT
DRAPE EXTREMITY T 121X128X90 (DRAPE) ×2 IMPLANT
DRAPE OEC MINIVIEW 54X84 (DRAPES) ×2 IMPLANT
DRAPE U-SHAPE 47X51 STRL (DRAPES) ×2 IMPLANT
DRSG MEPITEL 4X7.2 (GAUZE/BANDAGES/DRESSINGS) ×2 IMPLANT
DRSG PAD ABDOMINAL 8X10 ST (GAUZE/BANDAGES/DRESSINGS) ×2 IMPLANT
ELECT REM PT RETURN 9FT ADLT (ELECTROSURGICAL) ×2
ELECTRODE REM PT RTRN 9FT ADLT (ELECTROSURGICAL) ×1 IMPLANT
GAUZE SPONGE 4X4 12PLY STRL (GAUZE/BANDAGES/DRESSINGS) ×2 IMPLANT
GLOVE BIO SURGEON STRL SZ8 (GLOVE) ×2 IMPLANT
GLOVE BIOGEL PI IND STRL 7.0 (GLOVE) IMPLANT
GLOVE BIOGEL PI IND STRL 8 (GLOVE) ×2 IMPLANT
GLOVE BIOGEL PI INDICATOR 7.0 (GLOVE) ×2
GLOVE BIOGEL PI INDICATOR 8 (GLOVE) ×2
GLOVE ECLIPSE 6.5 STRL STRAW (GLOVE) ×1 IMPLANT
GLOVE ECLIPSE 8.0 STRL XLNG CF (GLOVE) ×2 IMPLANT
GOWN STRL REUS W/ TWL LRG LVL3 (GOWN DISPOSABLE) ×1 IMPLANT
GOWN STRL REUS W/ TWL XL LVL3 (GOWN DISPOSABLE) ×2 IMPLANT
GOWN STRL REUS W/TWL LRG LVL3 (GOWN DISPOSABLE) ×2
GOWN STRL REUS W/TWL XL LVL3 (GOWN DISPOSABLE) ×4
K-WIRE DBL END .054 LG (WIRE) ×3 IMPLANT
NDL HYPO 25X1 1.5 SAFETY (NEEDLE) IMPLANT
NEEDLE HYPO 22GX1.5 SAFETY (NEEDLE) IMPLANT
NEEDLE HYPO 25X1 1.5 SAFETY (NEEDLE) IMPLANT
NS IRRIG 1000ML POUR BTL (IV SOLUTION) ×2 IMPLANT
PACK BASIN DAY SURGERY FS (CUSTOM PROCEDURE TRAY) ×2 IMPLANT
PAD CAST 4YDX4 CTTN HI CHSV (CAST SUPPLIES) ×1 IMPLANT
PADDING CAST ABS 4INX4YD NS (CAST SUPPLIES)
PADDING CAST ABS COTTON 4X4 ST (CAST SUPPLIES) IMPLANT
PADDING CAST COTTON 4X4 STRL (CAST SUPPLIES) ×2
PADDING CAST COTTON 6X4 STRL (CAST SUPPLIES) IMPLANT
PENCIL BUTTON HOLSTER BLD 10FT (ELECTRODE) ×2 IMPLANT
SANITIZER HAND PURELL 535ML FO (MISCELLANEOUS) ×2 IMPLANT
SCREW 2.0 CORTICAL FT 20MM (Screw) ×1 IMPLANT
SCREW 2.0MM CORTICAL FT 24MM (Screw) ×1 IMPLANT
SCREW CORTICAL 2.0X18MM (Screw) ×1 IMPLANT
SHEET MEDIUM DRAPE 40X70 STRL (DRAPES) ×2 IMPLANT
SLEEVE SCD COMPRESS KNEE MED (MISCELLANEOUS) ×2 IMPLANT
SPLINT FAST PLASTER 5X30 (CAST SUPPLIES)
SPLINT PLASTER CAST FAST 5X30 (CAST SUPPLIES) IMPLANT
SPONGE LAP 18X18 RF (DISPOSABLE) ×2 IMPLANT
STOCKINETTE 6  STRL (DRAPES) ×1
STOCKINETTE 6 STRL (DRAPES) ×1 IMPLANT
SUCTION FRAZIER HANDLE 10FR (MISCELLANEOUS) ×1
SUCTION TUBE FRAZIER 10FR DISP (MISCELLANEOUS) ×1 IMPLANT
SUT ETHILON 3 0 PS 1 (SUTURE) ×2 IMPLANT
SUT MNCRL AB 3-0 PS2 18 (SUTURE) ×2 IMPLANT
SUT VIC AB 0 SH 27 (SUTURE) IMPLANT
SUT VIC AB 2-0 SH 27 (SUTURE)
SUT VIC AB 2-0 SH 27XBRD (SUTURE) IMPLANT
SUT VICRYL 0 UR6 27IN ABS (SUTURE) IMPLANT
SYR BULB 3OZ (MISCELLANEOUS) ×2 IMPLANT
SYR CONTROL 10ML LL (SYRINGE) IMPLANT
TOWEL GREEN STERILE FF (TOWEL DISPOSABLE) ×4 IMPLANT
TUBE CONNECTING 20X1/4 (TUBING) IMPLANT
UNDERPAD 30X30 (UNDERPADS AND DIAPERS) ×2 IMPLANT
YANKAUER SUCT BULB TIP NO VENT (SUCTIONS) IMPLANT

## 2018-05-24 NOTE — Discharge Instructions (Addendum)
Post Anesthesia Home Care Instructions  Activity: Get plenty of rest for the remainder of the day. A responsible individual must stay with you for 24 hours following the procedure.  For the next 24 hours, DO NOT: -Drive a car -Advertising copywriter -Drink alcoholic beverages -Take any medication unless instructed by your physician -Make any legal decisions or sign important papers.  Meals: Start with liquid foods such as gelatin or soup. Progress to regular foods as tolerated. Avoid greasy, spicy, heavy foods. If nausea and/or vomiting occur, drink only clear liquids until the nausea and/or vomiting subsides. Call your physician if vomiting continues.  Special Instructions/Symptoms: Your throat may feel dry or sore from the anesthesia or the breathing tube placed in your throat during surgery. If this causes discomfort, gargle with warm salt water. The discomfort should disappear within 24 hours.  If you had a scopolamine patch placed behind your ear for the management of post- operative nausea and/or vomiting:  1. The medication in the patch is effective for 72 hours, after which it should be removed.  Wrap patch in a tissue and discard in the trash. Wash hands thoroughly with soap and water. 2. You may remove the patch earlier than 72 hours if you experience unpleasant side effects which may include dry mouth, dizziness or visual disturbances. 3. Avoid touching the patch. Wash your hands with soap and water after contact with the patch.  Regional Anesthesia Blocks  1. Numbness or the inability to move the "blocked" extremity may last from 3-48 hours after placement. The length of time depends on the medication injected and your individual response to the medication. If the numbness is not going away after 48 hours, call your surgeon.  2. The extremity that is blocked will need to be protected until the numbness is gone and the  Strength has returned. Because you cannot feel it, you will  need to take extra care to avoid injury. Because it may be weak, you may have difficulty moving it or using it. You may not know what position it is in without looking at it while the block is in effect.  3. For blocks in the legs and feet, returning to weight bearing and walking needs to be done carefully. You will need to wait until the numbness is entirely gone and the strength has returned. You should be able to move your leg and foot normally before you try and bear weight or walk. You will need someone to be with you when you first try to ensure you do not fall and possibly risk injury.  4. Bruising and tenderness at the needle site are common side effects and will resolve in a few days.  5. Persistent numbness or new problems with movement should be communicated to the surgeon or the [facility] [phone] [facility] [phone]).   Toni Arthurs, MD [facility]  Please read the following information regarding your care after surgery.  Medications  You only need a prescription for the narcotic pain medicine (ex. oxycodone, Percocet, Norco).  All of the other medicines listed below are available over the counter.  X Toradol as prescribed.  Take medication with food. X resume suboxone as prescribed for severe pain  Weight Bearing X Bear weight only on your operated foot in the post-op shoe.   Cast / Splint / Dressing X Keep your splint, cast or dressing clean and dry.  Dont put anything (coat hanger, pencil, etc) down inside of it.  If it gets damp, use a hair dryer on the cool setting to dry it.  If it gets soaked, call the office to schedule an appointment for a cast change.   After your dressing, cast or splint is removed; you may shower, but do not soak or scrub the wound.  Allow the water to run over it, and then gently pat it dry.  Swelling It is normal for you to have swelling where you had surgery.  To reduce swelling and pain,  keep your toes above your nose for at least 3 days after surgery.  It may be necessary to keep your foot or leg elevated for several weeks.  If it hurts, it should be elevated.  Follow Up Call my office at [phone] when you are discharged from the hospital or surgery center to schedule an appointment to be seen two weeks after surgery.  Call my office at [phone] if you develop a fever >101.5 F, nausea, vomiting, bleeding from the surgical site or severe pain.

## 2018-05-24 NOTE — Progress Notes (Signed)
Assisted Dr. Oddono with right, ultrasound guided, popliteal block. Side rails up, monitors on throughout procedure. See vital signs in flow sheet. Tolerated Procedure well. 

## 2018-05-24 NOTE — Anesthesia Preprocedure Evaluation (Signed)
Anesthesia Evaluation  Patient identified by MRN, date of birth, ID band Patient awake    Reviewed: Allergy & Precautions, NPO status , Patient's Chart, lab work & pertinent test results  Airway Mallampati: II  TM Distance: >3 FB Neck ROM: Full    Dental no notable dental hx.    Pulmonary Current Smoker,    Pulmonary exam normal breath sounds clear to auscultation       Cardiovascular negative cardio ROS Normal cardiovascular exam Rhythm:Regular Rate:Normal     Neuro/Psych PSYCHIATRIC DISORDERS Depression negative neurological ROS     GI/Hepatic negative GI ROS, Neg liver ROS,   Endo/Other  negative endocrine ROS  Renal/GU negative Renal ROS  negative genitourinary   Musculoskeletal negative musculoskeletal ROS (+)   Abdominal   Peds negative pediatric ROS (+)  Hematology  (+) Blood dyscrasia, anemia ,   Anesthesia Other Findings   Reproductive/Obstetrics negative OB ROS                             Anesthesia Physical  Anesthesia Plan  ASA: III  Anesthesia Plan: General   Post-op Pain Management: GA combined w/ Regional for post-op pain   Induction: Intravenous  PONV Risk Score and Plan: 3 and Ondansetron, Dexamethasone, Treatment may vary due to age or medical condition and Midazolam  Airway Management Planned: LMA  Additional Equipment:   Intra-op Plan:   Post-operative Plan: Extubation in OR  Informed Consent: I have reviewed the patients History and Physical, chart, labs and discussed the procedure including the risks, benefits and alternatives for the proposed anesthesia with the patient or authorized representative who has indicated his/her understanding and acceptance.   Dental advisory given  Plan Discussed with: CRNA, Surgeon and Anesthesiologist  Anesthesia Plan Comments: ( )        Anesthesia Quick Evaluation

## 2018-05-24 NOTE — Transfer of Care (Signed)
Immediate Anesthesia Transfer of Care Note  Patient: Deborah [name]  Procedure(s) Performed: Right first metatarsal Scarf osteotomy, AKIN osteotomy and modified McBride bunionectomy (Right Foot)  Patient Location: PACU  Anesthesia Type:GA combined with regional for post-op pain  Level of Consciousness: sedated and patient cooperative  Airway & Oxygen Therapy: Patient Spontanous Breathing and Patient connected to face mask oxygen  Post-op Assessment: Report given to RN and Post -op Vital signs reviewed and stable  Post vital signs: Reviewed and stable  Last Vitals:  Vitals Value Taken Time  BP 105/67 05/24/2018  1:22 PM  Temp    Pulse 63 05/24/2018  1:23 PM  Resp 11 05/24/2018  1:23 PM  SpO2 100 % 05/24/2018  1:23 PM  Vitals shown include unvalidated device data.  Last Pain:  Vitals:   05/24/18 1121  TempSrc: Oral  PainSc: 0-No pain         Complications: No apparent anesthesia complications
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
## 2018-05-24 NOTE — Anesthesia Procedure Notes (Signed)
Anesthesia Regional Block: Popliteal block   Pre-Anesthetic Checklist: ,, timeout performed, Correct Patient, Correct Site, Correct Laterality, Correct Procedure, Correct Position, site marked, Risks and benefits discussed,  Surgical consent,  Pre-op evaluation,  At surgeon's request and post-op pain management  Laterality: Right  Prep: chloraprep       Needles:  Injection technique: Single-shot  Needle Type: Echogenic Stimulator Needle     Needle Length: 5cm  Needle Gauge: 22     Additional Needles:   Procedures:, nerve stimulator,,, ultrasound used (permanent image in chart),,,,  Narrative:  Start time: 05/24/2018 11:51 AM End time: 05/24/2018 12:00 PM Injection made incrementally with aspirations every 5 mL.  Performed by: Personally  Anesthesiologist: Bethena Midget, MD  Additional Notes: Functioning IV was confirmed and monitors were applied.  A 50mm 22ga Arrow echogenic stimulator needle was used. Sterile prep and drape,hand hygiene and sterile gloves were used. Ultrasound guidance: relevant anatomy identified, needle position confirmed, local anesthetic spread visualized around nerve(s)., vascular puncture avoided.  Image printed for medical record. Negative aspiration and negative test dose prior to incremental administration of local anesthetic. The patient tolerated the procedure well.

## 2018-05-24 NOTE — Anesthesia Postprocedure Evaluation (Signed)
Anesthesia Post Note  Patient: Deborah [name]  Procedure(s) Performed: Right first metatarsal Scarf osteotomy, AKIN osteotomy and modified McBride bunionectomy (Right Foot)     Patient location during evaluation: PACU Anesthesia Type: General Level of consciousness: awake and alert Pain management: pain level controlled Vital Signs Assessment: post-procedure vital signs reviewed and stable Respiratory status: spontaneous breathing, nonlabored ventilation, respiratory function stable and patient connected to nasal cannula oxygen Cardiovascular status: blood pressure returned to baseline and stable Postop Assessment: no apparent nausea or vomiting Anesthetic complications: no    Last Vitals:  Vitals:   05/24/18 1320 05/24/18 1345  BP: 116/69 128/77  Pulse: 62 72  Resp: 11 13  Temp: 36.4 C   SpO2: 100% 100%    Last Pain:  Vitals:   05/24/18 1345  TempSrc:   PainSc: 0-No pain                 Zac Torti

## 2018-05-24 NOTE — Anesthesia Procedure Notes (Signed)
Procedure Name: LMA Insertion Date/Time: 05/24/2018 12:14 PM Performed by: Sheryn Bison, CRNA Pre-anesthesia Checklist: Patient identified, Emergency Drugs available, Suction available and Patient being monitored Patient Re-evaluated:Patient Re-evaluated prior to induction Oxygen Delivery Method: Circle system utilized Preoxygenation: Pre-oxygenation with 100% oxygen Induction Type: IV induction Ventilation: Mask ventilation without difficulty LMA: LMA inserted LMA Size: 4.0 Number of attempts: 1 Airway Equipment and Method: Bite block Placement Confirmation: positive ETCO2 Tube secured with: Tape Dental Injury: Teeth and Oropharynx as per pre-operative assessment

## 2018-05-24 NOTE — Op Note (Signed)
05/24/2018  1:25 PM  PATIENT:  Deborah [name]  62 y.o. female  PRE-OPERATIVE DIAGNOSIS: Painful right foot bunion deformity  POST-OPERATIVE DIAGNOSIS: Same  Procedure(s): 1.  Right modified McBride bunionectomy 2.  Right first metatarsal scarf osteotomy 3.  Right hallux proximal phalanx Akin osteotomy 4.  Right foot AP and lateral radiographs  SURGEON:  Toni Arthurs, MD  ASSISTANT: None  ANESTHESIA:   General, regional  EBL:  minimal   TOURNIQUET:   Total Tourniquet Time Documented: Thigh (Right) - 46 minutes Total: Thigh (Right) - 46 minutes  COMPLICATIONS:  None apparent  DISPOSITION:  Extubated, awake and stable to recovery.  INDICATION FOR PROCEDURE: The patient is a 62 year old female with past medical history significant for chronic pain requiring Suboxone.  She has a painful right foot bunion deformity.  She has failed nonoperative treatment to date including activity modification, oral anti-inflammatories and shoewear modification.  She presents now for surgical treatment of this painful right foot bunion deformity.  The risks and benefits of the alternative treatment options have been discussed in detail.  The patient wishes to proceed with surgery and specifically understands risks of bleeding, infection, nerve damage, blood clots, need for additional surgery, amputation and death.  PROCEDURE IN DETAIL:  After pre operative consent was obtained, and the correct operative site was identified, the patient was brought to the operating room and placed supine on the OR table.  Anesthesia was administered.  Pre-operative antibiotics were administered.  A surgical timeout was taken.  The right lower externally was prepped and draped in standard sterile fashion with a tourniquet around the thigh.  The extremity was elevated, and the tourniquet was inflated to 250 mmHg.  A small dorsal incision was made at the first webspace.  Dissection was carried down through the subtenons  tissues.  An arthrotomy was made between the lateral sesamoid and the first metatarsal head.  The intermetatarsal ligament was divided under direct vision.  Several small perforations were made in the lateral joint capsule.  The hallux could then be positioned in 20 degrees of varus passively.  A longitudinal incision was then made over the medial eminence extending along the shaft of the first metatarsal.  Dissection was carried down through the skin and subcutaneous tissues.  The medial joint capsule was incised and elevated dorsally and plantarly.  The head of the metatarsal was then resected with an oscillating saw in line with the first metatarsal shaft.  A scarf osteotomy was then cut in the first metatarsal.  The head of the metatarsal was translated laterally to correct the intermetatarsal and hallux valgus angles.  The osteotomy was fixed with 2 Biomet 2 mm mini frag screws.  The overhanging bone was trimmed with a saw.  AP and lateral radiographs confirmed appropriate correction of the bunion deformity with residual hallux valgus interphalangeus.  The decision was made to proceed with an Aiken osteotomy.  Dissection was extended distally along with the proximal phalanx.  The flexor and extensor tendons were protected and a closing wedge osteotomy was made at the base of the proximal phalanx.  The osteotomy was closed and fixed with a 2 mm Biomet screw.  Final AP and lateral radiographs confirmed appropriate correction of the bunion in appropriate position and length of all hardware.  The joint capsule was then repaired with imbricating sutures of 2-0 Vicryl after irrigating copiously.  Vancomycin powder was sprinkled in the wound given the patient's history of MRSA infection.  The subcutaneous tissues were approximated  with Monocryl.  The skin incisions were closed with nylon.  Sterile dressings were applied followed by a bunion wrap.  The tourniquet was released after application of the dressings.   The patient was awakened from anesthesia and transported to the recovery room in stable condition.  FOLLOW UP PLAN: Weightbearing as tolerated on the right heel in a Darco shoe.  Follow-up in the office in 2 weeks for suture removal and conversion to a toe spacer.   RADIOGRAPHS: AP and lateral radiographs of the right foot are obtained intraoperatively.  These show interval correction of the bunion deformity.  Hardware is appropriately positioned and of the appropriate lengths.

## 2018-05-25 ENCOUNTER — Encounter (HOSPITAL_BASED_OUTPATIENT_CLINIC_OR_DEPARTMENT_OTHER): Payer: Self-pay | Admitting: Orthopedic Surgery

## 2018-08-17 DIAGNOSIS — M4310 Spondylolisthesis, site unspecified: Secondary | ICD-10-CM | POA: Insufficient documentation

## 2019-04-19 ENCOUNTER — Other Ambulatory Visit: Payer: Self-pay

## 2019-04-19 DIAGNOSIS — I83899 Varicose veins of unspecified lower extremities with other complications: Secondary | ICD-10-CM

## 2019-04-22 ENCOUNTER — Encounter (HOSPITAL_COMMUNITY): Payer: Medicare Other

## 2019-04-22 ENCOUNTER — Encounter: Payer: Medicare Other | Admitting: Family

## 2019-05-08 ENCOUNTER — Encounter: Payer: Medicare Other | Admitting: Family

## 2019-05-08 ENCOUNTER — Encounter (HOSPITAL_COMMUNITY): Payer: Medicare Other

## 2019-05-31 ENCOUNTER — Other Ambulatory Visit: Payer: Self-pay

## 2019-05-31 DIAGNOSIS — I83899 Varicose veins of unspecified lower extremities with other complications: Secondary | ICD-10-CM

## 2019-06-05 ENCOUNTER — Encounter (HOSPITAL_COMMUNITY): Payer: Medicare Other

## 2019-06-05 ENCOUNTER — Encounter: Payer: Medicare Other | Admitting: Family

## 2019-07-15 ENCOUNTER — Other Ambulatory Visit: Payer: Self-pay

## 2019-07-15 ENCOUNTER — Ambulatory Visit (HOSPITAL_COMMUNITY)
Admission: RE | Admit: 2019-07-15 | Discharge: 2019-07-15 | Disposition: A | Discharge status: Home / Self Care | Payer: Medicare Other | Source: Ambulatory Visit | Attending: Family | Admitting: Family

## 2019-07-15 ENCOUNTER — Encounter: Payer: Medicare Other | Admitting: Family

## 2019-07-15 DIAGNOSIS — I83899 Varicose veins of unspecified lower extremities with other complications: Secondary | ICD-10-CM

## 2019-09-04 ENCOUNTER — Encounter: Payer: Self-pay | Admitting: Surgery

## 2019-12-10 ENCOUNTER — Other Ambulatory Visit: Payer: Self-pay | Admitting: *Deleted

## 2019-12-10 DIAGNOSIS — I83899 Varicose veins of unspecified lower extremities with other complications: Secondary | ICD-10-CM
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
## 2019-12-13 ENCOUNTER — Ambulatory Visit (HOSPITAL_COMMUNITY)
Admission: RE | Admit: 2019-12-13 | Discharge: 2019-12-13 | Disposition: A | Discharge status: Home / Self Care | Payer: Medicare Other | Source: Ambulatory Visit | Attending: Vascular Surgery | Admitting: Vascular Surgery

## 2019-12-13 ENCOUNTER — Ambulatory Visit (INDEPENDENT_AMBULATORY_CARE_PROVIDER_SITE_OTHER): Payer: Medicare Other | Admitting: Physician Assistant

## 2019-12-13 ENCOUNTER — Other Ambulatory Visit: Payer: Self-pay

## 2019-12-13 VITALS — BP 127/84 | HR 75 | Temp 98.1°F | Resp 14 | Ht 65.0 in | Wt 110.0 lb

## 2019-12-13 DIAGNOSIS — I83899 Varicose veins of unspecified lower extremities with other complications: Secondary | ICD-10-CM

## 2019-12-17 ENCOUNTER — Telehealth: Payer: Self-pay

## 2019-12-17 NOTE — Telephone Encounter (Signed)
Left VM for pt to call me back regarding setting up a sclerotherapy appt.

## 2020-01-06 ENCOUNTER — Other Ambulatory Visit: Payer: Self-pay

## 2020-01-06 ENCOUNTER — Ambulatory Visit (INDEPENDENT_AMBULATORY_CARE_PROVIDER_SITE_OTHER): Payer: Medicare Other

## 2020-01-06 ENCOUNTER — Ambulatory Visit (INDEPENDENT_AMBULATORY_CARE_PROVIDER_SITE_OTHER): Payer: Medicare Other | Admitting: Orthopedic Surgery

## 2020-01-06 ENCOUNTER — Encounter: Payer: Self-pay | Admitting: Orthopedic Surgery

## 2020-01-06 VITALS — Ht 65.0 in | Wt 110.0 lb

## 2020-01-06 DIAGNOSIS — M79671 Pain in right foot: Secondary | ICD-10-CM | POA: Diagnosis not present

## 2020-01-06 DIAGNOSIS — M2021 Hallux rigidus, right foot: Secondary | ICD-10-CM | POA: Diagnosis not present
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
## 2020-02-13 ENCOUNTER — Other Ambulatory Visit: Payer: Self-pay | Admitting: General Surgery

## 2020-02-13 DIAGNOSIS — R1904 Left lower quadrant abdominal swelling, mass and lump: Secondary | ICD-10-CM

## 2020-02-17 ENCOUNTER — Ambulatory Visit: Payer: Medicare Other

## 2020-02-18 ENCOUNTER — Ambulatory Visit: Payer: Medicare Other

## 2020-03-03 ENCOUNTER — Ambulatory Visit (INDEPENDENT_AMBULATORY_CARE_PROVIDER_SITE_OTHER): Payer: Medicare Other | Admitting: Neurology

## 2020-03-03 ENCOUNTER — Encounter: Payer: Self-pay | Admitting: Neurology

## 2020-03-03 VITALS — BP 126/81 | HR 79 | Ht 65.0 in | Wt 106.5 lb

## 2020-03-03 DIAGNOSIS — M4130 Thoracogenic scoliosis, site unspecified: Secondary | ICD-10-CM | POA: Diagnosis not present

## 2020-03-03 DIAGNOSIS — R269 Unspecified abnormalities of gait and mobility: Secondary | ICD-10-CM | POA: Diagnosis not present
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
## 2020-03-09 ENCOUNTER — Ambulatory Visit: Payer: Medicare Other | Admitting: Physician Assistant

## 2020-03-17 ENCOUNTER — Telehealth: Payer: Self-pay | Admitting: Neurology

## 2020-03-17 NOTE — Telephone Encounter (Signed)
[email or web address] just called to inform that pt has requested her therapy not begin until the 25th of Aug.  This is FYI no call back requested

## 2020-03-18 ENCOUNTER — Encounter: Payer: Self-pay | Admitting: Orthopedic Surgery

## 2020-03-19 ENCOUNTER — Other Ambulatory Visit: Payer: Self-pay

## 2020-03-19 ENCOUNTER — Encounter (HOSPITAL_COMMUNITY): Payer: Self-pay | Admitting: Emergency Medicine

## 2020-03-19 ENCOUNTER — Emergency Department (HOSPITAL_COMMUNITY)
Admission: EM | Admit: 2020-03-19 | Discharge: 2020-03-20 | Disposition: A | Discharge status: Home / Self Care | Payer: Medicare Other | Source: Home / Self Care

## 2020-03-19 DIAGNOSIS — Z5321 Procedure and treatment not carried out due to patient leaving prior to being seen by health care provider: Secondary | ICD-10-CM | POA: Insufficient documentation

## 2020-03-19 DIAGNOSIS — R1084 Generalized abdominal pain: Secondary | ICD-10-CM | POA: Insufficient documentation

## 2020-03-19 DIAGNOSIS — K4030 Unilateral inguinal hernia, with obstruction, without gangrene, not specified as recurrent: Secondary | ICD-10-CM | POA: Diagnosis not present

## 2020-03-19 DIAGNOSIS — R109 Unspecified abdominal pain: Secondary | ICD-10-CM | POA: Insufficient documentation

## 2020-03-19 DIAGNOSIS — R110 Nausea: Secondary | ICD-10-CM | POA: Insufficient documentation

## 2020-03-19 DIAGNOSIS — K4040 Unilateral inguinal hernia, with gangrene, not specified as recurrent: Secondary | ICD-10-CM | POA: Diagnosis not present

## 2020-03-19 LAB — COMPREHENSIVE METABOLIC PANEL
ALT: 18 U/L (ref 0–44)
AST: 23 U/L (ref 15–41)
Albumin: 4.3 g/dL (ref 3.5–5.0)
Alkaline Phosphatase: 59 U/L (ref 38–126)
Anion gap: 10 (ref 5–15)
BUN: 14 mg/dL (ref 8–23)
CO2: 28 mmol/L (ref 22–32)
Calcium: 9.5 mg/dL (ref 8.9–10.3)
Chloride: 100 mmol/L (ref 98–111)
Creatinine, Ser: 0.72 mg/dL (ref 0.44–1.00)
GFR calc Af Amer: >60 mL/min (ref >60)
GFR calc non Af Amer: >60 mL/min (ref >60)
Glucose, Bld: 94 mg/dL (ref 70–99)
Potassium: 3.5 mmol/L (ref 3.5–5.1)
Sodium: 138 mmol/L (ref 135–145)
Total Bilirubin: 0.6 mg/dL (ref 0.3–1.2)
Total Protein: 6.2 g/dL — ABNORMAL LOW (ref 6.5–8.1)

## 2020-03-19 LAB — CBC
HCT: 41.7 % (ref 36.0–46.0)
Hemoglobin: 13.4 g/dL (ref 12.0–15.0)
MCH: 29.6 pg (ref 26.0–34.0)
MCHC: 32.1 g/dL (ref 30.0–36.0)
MCV: 92.3 fL (ref 80.0–100.0)
Platelets: 242 10*3/uL (ref 150–400)
RBC: 4.52 MIL/uL (ref 3.87–5.11)
RDW: 12.6 % (ref 11.5–15.5)
WBC: 8.4 10*3/uL (ref 4.0–10.5)
nRBC: 0 % (ref 0.0–0.2)

## 2020-03-19 LAB — LIPASE, BLOOD: Lipase: 20 U/L (ref 11–51)

## 2020-03-19 MED ORDER — ONDANSETRON 4 MG PO TBDP
4.0000 mg | ORAL_TABLET | Freq: Once | ORAL | Status: AC | PRN
  Administered 2020-03-19: 4 mg via ORAL
  Filled 2020-03-19: qty 1

## 2020-03-19 NOTE — ED Notes (Signed)
Pt states she lost her pill case with her oxycodone in it so he is out of them, she has been taking her suboxone for pain.

## 2020-03-19 NOTE — ED Triage Notes (Signed)
Pt c/o generalized abd pain onset today @ 1500 this evening. +nausea. Denies v/d. Normal BM this am.  Pt reports swollen area to L groin is more tender, pt states it is not a hernia, she is due to have CT scan but has not scheduled it.

## 2020-03-20 ENCOUNTER — Encounter (HOSPITAL_COMMUNITY): Payer: Self-pay | Admitting: Pediatrics

## 2020-03-20 ENCOUNTER — Ambulatory Visit: Payer: Medicare Other | Admitting: Physician Assistant

## 2020-03-20 ENCOUNTER — Inpatient Hospital Stay (HOSPITAL_COMMUNITY)
Admission: EM | Admit: 2020-03-20 | Discharge: 2020-03-25 | DRG: 330 | Disposition: A | Discharge status: Home / Self Care | Payer: Medicare Other | Attending: General Surgery | Admitting: General Surgery

## 2020-03-20 ENCOUNTER — Other Ambulatory Visit: Payer: Self-pay

## 2020-03-20 DIAGNOSIS — Z7983 Long term (current) use of bisphosphonates: Secondary | ICD-10-CM

## 2020-03-20 DIAGNOSIS — K567 Ileus, unspecified: Secondary | ICD-10-CM | POA: Diagnosis not present

## 2020-03-20 DIAGNOSIS — K219 Gastro-esophageal reflux disease without esophagitis: Secondary | ICD-10-CM | POA: Diagnosis present

## 2020-03-20 DIAGNOSIS — Z806 Family history of leukemia: Secondary | ICD-10-CM

## 2020-03-20 DIAGNOSIS — M419 Scoliosis, unspecified: Secondary | ICD-10-CM | POA: Diagnosis present

## 2020-03-20 DIAGNOSIS — F17210 Nicotine dependence, cigarettes, uncomplicated: Secondary | ICD-10-CM | POA: Diagnosis present

## 2020-03-20 DIAGNOSIS — R1032 Left lower quadrant pain: Secondary | ICD-10-CM

## 2020-03-20 DIAGNOSIS — Z881 Allergy status to other antibiotic agents status: Secondary | ICD-10-CM

## 2020-03-20 DIAGNOSIS — F329 Major depressive disorder, single episode, unspecified: Secondary | ICD-10-CM | POA: Diagnosis present

## 2020-03-20 DIAGNOSIS — G2581 Restless legs syndrome: Secondary | ICD-10-CM | POA: Diagnosis present

## 2020-03-20 DIAGNOSIS — F909 Attention-deficit hyperactivity disorder, unspecified type: Secondary | ICD-10-CM | POA: Diagnosis present

## 2020-03-20 DIAGNOSIS — G8929 Other chronic pain: Secondary | ICD-10-CM | POA: Diagnosis present

## 2020-03-20 DIAGNOSIS — Z9049 Acquired absence of other specified parts of digestive tract: Secondary | ICD-10-CM

## 2020-03-20 DIAGNOSIS — Z79899 Other long term (current) drug therapy: Secondary | ICD-10-CM

## 2020-03-20 DIAGNOSIS — Z87820 Personal history of traumatic brain injury: Secondary | ICD-10-CM

## 2020-03-20 DIAGNOSIS — K5900 Constipation, unspecified: Secondary | ICD-10-CM | POA: Diagnosis not present

## 2020-03-20 DIAGNOSIS — Z20822 Contact with and (suspected) exposure to covid-19: Secondary | ICD-10-CM | POA: Diagnosis present

## 2020-03-20 DIAGNOSIS — K4040 Unilateral inguinal hernia, with gangrene, not specified as recurrent: Principal | ICD-10-CM | POA: Diagnosis present

## 2020-03-20 DIAGNOSIS — Z79891 Long term (current) use of opiate analgesic: Secondary | ICD-10-CM

## 2020-03-20 DIAGNOSIS — K4030 Unilateral inguinal hernia, with obstruction, without gangrene, not specified as recurrent: Secondary | ICD-10-CM

## 2020-03-20 DIAGNOSIS — M549 Dorsalgia, unspecified: Secondary | ICD-10-CM | POA: Diagnosis present

## 2020-03-20 NOTE — ED Notes (Signed)
Called for vitals recheck x1 with no response 

## 2020-03-20 NOTE — ED Notes (Signed)
Pt stated they wanted water and we advised we couldn't provide water and she got very irate.

## 2020-03-20 NOTE — ED Notes (Signed)
Called pts name for updated VS with no response. Pt LWBS.

## 2020-03-20 NOTE — ED Triage Notes (Signed)
C/O nausea and vomiting, seen at this facility for same.

## 2020-03-21 ENCOUNTER — Emergency Department (HOSPITAL_COMMUNITY): Payer: Medicare Other

## 2020-03-21 ENCOUNTER — Emergency Department (HOSPITAL_COMMUNITY): Payer: Medicare Other | Admitting: Anesthesiology

## 2020-03-21 ENCOUNTER — Encounter (HOSPITAL_COMMUNITY): Admission: EM | Disposition: A | Discharge status: Home / Self Care | Payer: Self-pay | Source: Home / Self Care

## 2020-03-21 DIAGNOSIS — Z806 Family history of leukemia: Secondary | ICD-10-CM | POA: Diagnosis not present

## 2020-03-21 DIAGNOSIS — K4030 Unilateral inguinal hernia, with obstruction, without gangrene, not specified as recurrent: Secondary | ICD-10-CM | POA: Diagnosis present

## 2020-03-21 DIAGNOSIS — K567 Ileus, unspecified: Secondary | ICD-10-CM | POA: Diagnosis not present

## 2020-03-21 DIAGNOSIS — Z79899 Other long term (current) drug therapy: Secondary | ICD-10-CM | POA: Diagnosis not present

## 2020-03-21 DIAGNOSIS — M419 Scoliosis, unspecified: Secondary | ICD-10-CM | POA: Diagnosis present

## 2020-03-21 DIAGNOSIS — K219 Gastro-esophageal reflux disease without esophagitis: Secondary | ICD-10-CM | POA: Diagnosis present

## 2020-03-21 DIAGNOSIS — F329 Major depressive disorder, single episode, unspecified: Secondary | ICD-10-CM | POA: Diagnosis present

## 2020-03-21 DIAGNOSIS — Z881 Allergy status to other antibiotic agents status: Secondary | ICD-10-CM | POA: Diagnosis not present

## 2020-03-21 DIAGNOSIS — G2581 Restless legs syndrome: Secondary | ICD-10-CM | POA: Diagnosis present

## 2020-03-21 DIAGNOSIS — K4040 Unilateral inguinal hernia, with gangrene, not specified as recurrent: Secondary | ICD-10-CM | POA: Diagnosis present

## 2020-03-21 DIAGNOSIS — Z79891 Long term (current) use of opiate analgesic: Secondary | ICD-10-CM | POA: Diagnosis not present

## 2020-03-21 DIAGNOSIS — Z87820 Personal history of traumatic brain injury: Secondary | ICD-10-CM | POA: Diagnosis not present

## 2020-03-21 DIAGNOSIS — Z9049 Acquired absence of other specified parts of digestive tract: Secondary | ICD-10-CM

## 2020-03-21 DIAGNOSIS — Z20822 Contact with and (suspected) exposure to covid-19: Secondary | ICD-10-CM | POA: Diagnosis present

## 2020-03-21 DIAGNOSIS — F909 Attention-deficit hyperactivity disorder, unspecified type: Secondary | ICD-10-CM | POA: Diagnosis present

## 2020-03-21 DIAGNOSIS — F17210 Nicotine dependence, cigarettes, uncomplicated: Secondary | ICD-10-CM | POA: Diagnosis present

## 2020-03-21 DIAGNOSIS — K5900 Constipation, unspecified: Secondary | ICD-10-CM | POA: Diagnosis not present

## 2020-03-21 DIAGNOSIS — M549 Dorsalgia, unspecified: Secondary | ICD-10-CM | POA: Diagnosis present

## 2020-03-21 DIAGNOSIS — Z7983 Long term (current) use of bisphosphonates: Secondary | ICD-10-CM | POA: Diagnosis not present

## 2020-03-21 DIAGNOSIS — G8929 Other chronic pain: Secondary | ICD-10-CM | POA: Diagnosis present

## 2020-03-21 HISTORY — PX: INGUINAL HERNIA REPAIR: SHX194

## 2020-03-21 LAB — SARS CORONAVIRUS 2 BY RT PCR (HOSPITAL ORDER, PERFORMED IN ~~LOC~~ HOSPITAL LAB): SARS Coronavirus 2: NEGATIVE

## 2020-03-21 LAB — CBC WITH DIFFERENTIAL/PLATELET
Abs Immature Granulocytes: 0.04 10*3/uL (ref 0.00–0.07)
Basophils Absolute: 0.1 10*3/uL (ref 0.0–0.1)
Basophils Relative: 1 %
Eosinophils Absolute: 0.1 10*3/uL (ref 0.0–0.5)
Eosinophils Relative: 1 %
HCT: 45.9 % (ref 36.0–46.0)
Hemoglobin: 15 g/dL (ref 12.0–15.0)
Immature Granulocytes: 0 %
Lymphocytes Relative: 14 %
Lymphs Abs: 1.8 10*3/uL (ref 0.7–4.0)
MCH: 30.2 pg (ref 26.0–34.0)
MCHC: 32.7 g/dL (ref 30.0–36.0)
MCV: 92.5 fL (ref 80.0–100.0)
Monocytes Absolute: 1.1 10*3/uL — ABNORMAL HIGH (ref 0.1–1.0)
Monocytes Relative: 8 %
Neutro Abs: 10.1 10*3/uL — ABNORMAL HIGH (ref 1.7–7.7)
Neutrophils Relative %: 76 %
Platelets: 291 10*3/uL (ref 150–400)
RBC: 4.96 MIL/uL (ref 3.87–5.11)
RDW: 12.9 % (ref 11.5–15.5)
WBC: 13.3 10*3/uL — ABNORMAL HIGH (ref 4.0–10.5)
nRBC: 0 % (ref 0.0–0.2)

## 2020-03-21 LAB — URINALYSIS, ROUTINE W REFLEX MICROSCOPIC
Bacteria, UA: NONE SEEN
Bilirubin Urine: NEGATIVE
Glucose, UA: NEGATIVE mg/dL
Hgb urine dipstick: NEGATIVE
Ketones, ur: 20 mg/dL — AB
Nitrite: NEGATIVE
Protein, ur: NEGATIVE mg/dL
Specific Gravity, Urine: >1.046 — ABNORMAL HIGH (ref 1.005–1.030)
pH: 5 (ref 5.0–8.0)

## 2020-03-21 LAB — COMPREHENSIVE METABOLIC PANEL
ALT: 20 U/L (ref 0–44)
AST: 22 U/L (ref 15–41)
Albumin: 4.3 g/dL (ref 3.5–5.0)
Alkaline Phosphatase: 58 U/L (ref 38–126)
Anion gap: 16 — ABNORMAL HIGH (ref 5–15)
BUN: 28 mg/dL — ABNORMAL HIGH (ref 8–23)
CO2: 23 mmol/L (ref 22–32)
Calcium: 9.2 mg/dL (ref 8.9–10.3)
Chloride: 95 mmol/L — ABNORMAL LOW (ref 98–111)
Creatinine, Ser: 0.87 mg/dL (ref 0.44–1.00)
GFR calc Af Amer: >60 mL/min (ref >60)
GFR calc non Af Amer: >60 mL/min (ref >60)
Glucose, Bld: 103 mg/dL — ABNORMAL HIGH (ref 70–99)
Potassium: 3.9 mmol/L (ref 3.5–5.1)
Sodium: 134 mmol/L — ABNORMAL LOW (ref 135–145)
Total Bilirubin: 1.2 mg/dL (ref 0.3–1.2)
Total Protein: 6.3 g/dL — ABNORMAL LOW (ref 6.5–8.1)

## 2020-03-21 LAB — LIPASE, BLOOD: Lipase: 20 U/L (ref 11–51)

## 2020-03-21 LAB — LACTIC ACID, PLASMA: Lactic Acid, Venous: 1.1 mmol/L (ref 0.5–1.9)

## 2020-03-21 SURGERY — REPAIR, HERNIA, INGUINAL, INCARCERATED
Anesthesia: General | Site: Abdomen | Laterality: Left

## 2020-03-21 MED ORDER — HYDROMORPHONE HCL 1 MG/ML IJ SOLN
INTRAMUSCULAR | Status: AC
  Filled 2020-03-21: qty 1

## 2020-03-21 MED ORDER — BUPIVACAINE-EPINEPHRINE (PF) 0.25% -1:200000 IJ SOLN
INTRAMUSCULAR | Status: AC
  Filled 2020-03-21: qty 30

## 2020-03-21 MED ORDER — ENOXAPARIN SODIUM 40 MG/0.4ML ~~LOC~~ SOLN
40.0000 mg | SUBCUTANEOUS | Status: DC
  Administered 2020-03-22 – 2020-03-25 (×4): 40 mg via SUBCUTANEOUS
  Filled 2020-03-21 (×5): qty 0.4

## 2020-03-21 MED ORDER — ACETAMINOPHEN 10 MG/ML IV SOLN
1000.0000 mg | Freq: Once | INTRAVENOUS | Status: AC
  Administered 2020-03-21: 1000 mg via INTRAVENOUS

## 2020-03-21 MED ORDER — CEFAZOLIN SODIUM-DEXTROSE 2-4 GM/100ML-% IV SOLN: 2.0000 g | Freq: Once | INTRAVENOUS | Status: DC

## 2020-03-21 MED ORDER — IOHEXOL 300 MG/ML  SOLN
100.0000 mL | Freq: Once | INTRAMUSCULAR | Status: AC | PRN
  Administered 2020-03-21: 100 mL via INTRAVENOUS

## 2020-03-21 MED ORDER — HYDROMORPHONE HCL 1 MG/ML IJ SOLN
INTRAMUSCULAR | Status: AC
  Administered 2020-03-21: 1 mg via INTRAVENOUS
  Filled 2020-03-21: qty 1

## 2020-03-21 MED ORDER — MIDAZOLAM HCL 2 MG/2ML IJ SOLN
INTRAMUSCULAR | Status: DC | PRN
  Administered 2020-03-21: 2 mg via INTRAVENOUS

## 2020-03-21 MED ORDER — CEFAZOLIN SODIUM-DEXTROSE 2-4 GM/100ML-% IV SOLN
2.0000 g | Freq: Once | INTRAVENOUS | Status: AC
  Administered 2020-03-21: 2 g via INTRAVENOUS
  Filled 2020-03-21: qty 100

## 2020-03-21 MED ORDER — LIDOCAINE 2% (20 MG/ML) 5 ML SYRINGE
INTRAMUSCULAR | Status: DC | PRN
  Administered 2020-03-21: 60 mg via INTRAVENOUS

## 2020-03-21 MED ORDER — 0.9 % SODIUM CHLORIDE (POUR BTL) OPTIME
TOPICAL | Status: DC | PRN
  Administered 2020-03-21: 1000 mL

## 2020-03-21 MED ORDER — ONDANSETRON HCL 4 MG/2ML IJ SOLN
4.0000 mg | Freq: Once | INTRAMUSCULAR | Status: AC
  Administered 2020-03-21: 4 mg via INTRAVENOUS
  Filled 2020-03-21: qty 2

## 2020-03-21 MED ORDER — HYDROMORPHONE HCL 1 MG/ML IJ SOLN: 1.0000 mg | Freq: Once | INTRAMUSCULAR | Status: AC

## 2020-03-21 MED ORDER — FENTANYL CITRATE (PF) 250 MCG/5ML IJ SOLN
INTRAMUSCULAR | Status: DC | PRN
Start: 2020-03-21 — End: 2020-03-21
  Administered 2020-03-21 (×2): 25 ug via INTRAVENOUS
  Administered 2020-03-21: 50 ug via INTRAVENOUS
  Administered 2020-03-21: 100 ug via INTRAVENOUS
  Administered 2020-03-21: 50 ug via INTRAVENOUS

## 2020-03-21 MED ORDER — SODIUM CHLORIDE 0.9 % IV BOLUS
1000.0000 mL | Freq: Once | INTRAVENOUS | Status: AC
  Administered 2020-03-21: 1000 mL via INTRAVENOUS

## 2020-03-21 MED ORDER — CHLORHEXIDINE GLUCONATE 0.12 % MT SOLN
OROMUCOSAL | Status: AC
  Administered 2020-03-21: 15 mL via OROMUCOSAL
  Filled 2020-03-21: qty 15

## 2020-03-21 MED ORDER — LORAZEPAM 2 MG/ML IJ SOLN: 1.0000 mg | INTRAMUSCULAR | Status: DC | PRN

## 2020-03-21 MED ORDER — BUPIVACAINE-EPINEPHRINE 0.25% -1:200000 IJ SOLN
INTRAMUSCULAR | Status: DC | PRN
  Administered 2020-03-21: 20 mL

## 2020-03-21 MED ORDER — PROPOFOL 10 MG/ML IV BOLUS
INTRAVENOUS | Status: DC | PRN
  Administered 2020-03-21: 110 mg via INTRAVENOUS

## 2020-03-21 MED ORDER — METHOCARBAMOL 1000 MG/10ML IJ SOLN
1000.0000 mg | Freq: Three times a day (TID) | INTRAVENOUS | Status: DC | PRN
  Administered 2020-03-21: 1000 mg via INTRAVENOUS
  Filled 2020-03-21: qty 10

## 2020-03-21 MED ORDER — LACTATED RINGERS IV SOLN: INTRAVENOUS | Status: DC

## 2020-03-21 MED ORDER — ACETAMINOPHEN 10 MG/ML IV SOLN
INTRAVENOUS | Status: AC
  Filled 2020-03-21: qty 100

## 2020-03-21 MED ORDER — HYDROMORPHONE HCL 1 MG/ML IJ SOLN
2.0000 mg | INTRAMUSCULAR | Status: DC | PRN
  Administered 2020-03-21: 2 mg via INTRAVENOUS
  Filled 2020-03-21: qty 2

## 2020-03-21 MED ORDER — ONDANSETRON HCL 4 MG/2ML IJ SOLN
4.0000 mg | Freq: Four times a day (QID) | INTRAMUSCULAR | Status: DC | PRN
  Administered 2020-03-21 – 2020-03-25 (×7): 4 mg via INTRAVENOUS
  Filled 2020-03-21 (×7): qty 2

## 2020-03-21 MED ORDER — DEXAMETHASONE SODIUM PHOSPHATE 10 MG/ML IJ SOLN
INTRAMUSCULAR | Status: DC | PRN
  Administered 2020-03-21: 10 mg via INTRAVENOUS

## 2020-03-21 MED ORDER — SUGAMMADEX SODIUM 200 MG/2ML IV SOLN
INTRAVENOUS | Status: DC | PRN
  Administered 2020-03-21: 100 mg via INTRAVENOUS

## 2020-03-21 MED ORDER — HYDROMORPHONE HCL 1 MG/ML IJ SOLN: 0.5000 mg | INTRAMUSCULAR | Status: DC | PRN

## 2020-03-21 MED ORDER — ONDANSETRON 4 MG PO TBDP
4.0000 mg | ORAL_TABLET | Freq: Four times a day (QID) | ORAL | Status: DC | PRN
  Administered 2020-03-25: 4 mg via ORAL
  Filled 2020-03-21: qty 1

## 2020-03-21 MED ORDER — FENTANYL CITRATE (PF) 250 MCG/5ML IJ SOLN
INTRAMUSCULAR | Status: AC
  Filled 2020-03-21: qty 5

## 2020-03-21 MED ORDER — SUCCINYLCHOLINE CHLORIDE 200 MG/10ML IV SOSY
PREFILLED_SYRINGE | INTRAVENOUS | Status: DC | PRN
  Administered 2020-03-21: 80 mg via INTRAVENOUS

## 2020-03-21 MED ORDER — ONDANSETRON HCL 4 MG/2ML IJ SOLN
INTRAMUSCULAR | Status: DC | PRN
  Administered 2020-03-21: 4 mg via INTRAVENOUS

## 2020-03-21 MED ORDER — PHENYLEPHRINE HCL (PRESSORS) 10 MG/ML IV SOLN
INTRAVENOUS | Status: DC | PRN
  Administered 2020-03-21: 80 ug via INTRAVENOUS

## 2020-03-21 MED ORDER — KETAMINE HCL 50 MG/ML IJ SOLN
INTRAMUSCULAR | Status: DC | PRN
  Administered 2020-03-21: 30 mg via INTRAMUSCULAR
  Administered 2020-03-21 (×2): 10 mg via INTRAMUSCULAR

## 2020-03-21 MED ORDER — MIDAZOLAM HCL 2 MG/2ML IJ SOLN
INTRAMUSCULAR | Status: AC
  Filled 2020-03-21: qty 2

## 2020-03-21 MED ORDER — KETAMINE HCL 50 MG/5ML IJ SOSY
PREFILLED_SYRINGE | INTRAMUSCULAR | Status: AC
  Filled 2020-03-21: qty 5

## 2020-03-21 MED ORDER — ACETAMINOPHEN 10 MG/ML IV SOLN
1000.0000 mg | Freq: Four times a day (QID) | INTRAVENOUS | Status: AC
  Administered 2020-03-22: 1000 mg via INTRAVENOUS
  Filled 2020-03-21 (×2): qty 100

## 2020-03-21 MED ORDER — PANTOPRAZOLE SODIUM 40 MG IV SOLR
40.0000 mg | Freq: Every day | INTRAVENOUS | Status: DC
  Administered 2020-03-21 – 2020-03-23 (×3): 40 mg via INTRAVENOUS
  Filled 2020-03-21 (×3): qty 40

## 2020-03-21 MED ORDER — KETOROLAC TROMETHAMINE 30 MG/ML IJ SOLN
INTRAMUSCULAR | Status: DC | PRN
  Administered 2020-03-21: 30 mg via INTRAVENOUS

## 2020-03-21 MED ORDER — PROPOFOL 10 MG/ML IV BOLUS
INTRAVENOUS | Status: AC
  Filled 2020-03-21: qty 20

## 2020-03-21 MED ORDER — HYDROMORPHONE HCL 1 MG/ML IJ SOLN
1.0000 mg | INTRAMUSCULAR | Status: DC | PRN
  Administered 2020-03-21: 1 mg via INTRAVENOUS

## 2020-03-21 MED ORDER — CHLORHEXIDINE GLUCONATE 0.12 % MT SOLN: 15.0000 mL | Freq: Once | OROMUCOSAL | Status: AC

## 2020-03-21 MED ORDER — ROCURONIUM BROMIDE 10 MG/ML (PF) SYRINGE
PREFILLED_SYRINGE | INTRAVENOUS | Status: DC | PRN
  Administered 2020-03-21: 30 mg via INTRAVENOUS

## 2020-03-21 MED ORDER — POTASSIUM CHLORIDE IN NACL 20-0.9 MEQ/L-% IV SOLN
INTRAVENOUS | Status: DC
  Filled 2020-03-21 (×8): qty 1000

## 2020-03-21 MED ORDER — HYDROMORPHONE HCL 1 MG/ML IJ SOLN
1.0000 mg | INTRAMUSCULAR | Status: DC | PRN
  Administered 2020-03-21 – 2020-03-23 (×8): 1 mg via INTRAVENOUS
  Filled 2020-03-21 (×8): qty 1

## 2020-03-21 SURGICAL SUPPLY — 50 items
ADH SKN CLS APL DERMABOND .7 (GAUZE/BANDAGES/DRESSINGS) ×1
APL PRP STRL LF DISP 70% ISPRP (MISCELLANEOUS) ×1
BLADE CLIPPER SURG (BLADE) ×1 IMPLANT
CANISTER SUCT 3000ML PPV (MISCELLANEOUS) ×1 IMPLANT
CHLORAPREP W/TINT 26 (MISCELLANEOUS) ×2 IMPLANT
CNTNR URN SCR LID CUP LEK RST (MISCELLANEOUS) IMPLANT
CONT SPEC 4OZ STRL OR WHT (MISCELLANEOUS) ×2
COVER SURGICAL LIGHT HANDLE (MISCELLANEOUS) ×2 IMPLANT
COVER WAND RF STERILE (DRAPES) ×2 IMPLANT
DECANTER SPIKE VIAL GLASS SM (MISCELLANEOUS) ×2 IMPLANT
DERMABOND ADVANCED (GAUZE/BANDAGES/DRESSINGS) ×1
DERMABOND ADVANCED .7 DNX12 (GAUZE/BANDAGES/DRESSINGS) ×1 IMPLANT
DRAIN PENROSE 1/2X12 LTX STRL (WOUND CARE) IMPLANT
DRAPE LAPAROSCOPIC ABDOMINAL (DRAPES) ×2 IMPLANT
ELECT REM PT RETURN 9FT ADLT (ELECTROSURGICAL) ×2
ELECTRODE REM PT RTRN 9FT ADLT (ELECTROSURGICAL) ×1 IMPLANT
GLOVE BIO SURGEON STRL SZ8 (GLOVE) ×3 IMPLANT
GLOVE BIOGEL M 6.5 STRL (GLOVE) ×2 IMPLANT
GLOVE BIOGEL PI IND STRL 6.5 (GLOVE) IMPLANT
GLOVE BIOGEL PI IND STRL 8 (GLOVE) ×1 IMPLANT
GLOVE BIOGEL PI INDICATOR 6.5 (GLOVE) ×1
GLOVE BIOGEL PI INDICATOR 8 (GLOVE) ×1
GOWN STRL REUS W/ TWL LRG LVL3 (GOWN DISPOSABLE) ×1 IMPLANT
GOWN STRL REUS W/ TWL XL LVL3 (GOWN DISPOSABLE) ×1 IMPLANT
GOWN STRL REUS W/TWL LRG LVL3 (GOWN DISPOSABLE) ×2
GOWN STRL REUS W/TWL XL LVL3 (GOWN DISPOSABLE) ×2
KIT BASIN OR (CUSTOM PROCEDURE TRAY) ×2 IMPLANT
KIT TURNOVER KIT B (KITS) ×2 IMPLANT
NEEDLE 22X1 1/2 (OR ONLY) (NEEDLE) ×2 IMPLANT
NS IRRIG 1000ML POUR BTL (IV SOLUTION) ×2 IMPLANT
PACK GENERAL/GYN (CUSTOM PROCEDURE TRAY) ×2 IMPLANT
PAD ARMBOARD 7.5X6 YLW CONV (MISCELLANEOUS) ×3 IMPLANT
PENCIL SMOKE EVACUATOR (MISCELLANEOUS) ×2 IMPLANT
RELOAD PROXIMATE 75MM BLUE (ENDOMECHANICALS) ×4 IMPLANT
RELOAD STAPLE 75 3.8 BLU REG (ENDOMECHANICALS) IMPLANT
SPECIMEN JAR SMALL (MISCELLANEOUS) ×1 IMPLANT
STAPLER GUN LINEAR PROX 60 (STAPLE) ×1 IMPLANT
STAPLER PROXIMATE 75MM BLUE (STAPLE) ×1 IMPLANT
SUT MNCRL AB 4-0 PS2 18 (SUTURE) ×2 IMPLANT
SUT PROLENE 2 0 CT2 30 (SUTURE) ×5 IMPLANT
SUT SILK 2 0 SH CR/8 (SUTURE) ×1 IMPLANT
SUT SILK 3 0 SH CR/8 (SUTURE) ×1 IMPLANT
SUT VIC AB 2-0 SH 27 (SUTURE) ×2
SUT VIC AB 2-0 SH 27X BRD (SUTURE) ×1 IMPLANT
SUT VIC AB 3-0 SH 27 (SUTURE) ×2
SUT VIC AB 3-0 SH 27X BRD (SUTURE) ×1 IMPLANT
SUT VICRYL AB 3 0 TIES (SUTURE) IMPLANT
SYR CONTROL 10ML LL (SYRINGE) ×2 IMPLANT
TOWEL GREEN STERILE (TOWEL DISPOSABLE) ×2 IMPLANT
TOWEL GREEN STERILE FF (TOWEL DISPOSABLE) ×2 IMPLANT

## 2020-03-21 NOTE — Anesthesia Preprocedure Evaluation (Addendum)
Anesthesia Evaluation  Patient identified by MRN, date of birth, ID band Patient awake    Reviewed: Allergy & Precautions, H&P , NPO status , Patient's Chart, lab work & pertinent test results  Airway Mallampati: II  TM Distance: >3 FB Neck ROM: Full    Dental no notable dental hx. (+) Dental Advisory Given, Teeth Intact   Pulmonary Current Smoker and Patient abstained from smoking.,    Pulmonary exam normal breath sounds clear to auscultation       Cardiovascular negative cardio ROS   Rhythm:Regular Rate:Normal     Neuro/Psych Depression negative neurological ROS     GI/Hepatic Neg liver ROS, GERD  ,  Endo/Other  negative endocrine ROS  Renal/GU negative Renal ROS  negative genitourinary   Musculoskeletal   Abdominal   Peds  Hematology  (+) Blood dyscrasia, anemia ,   Anesthesia Other Findings   Reproductive/Obstetrics negative OB ROS                            Anesthesia Physical Anesthesia Plan  ASA: II  Anesthesia Plan: General   Post-op Pain Management:    Induction: Intravenous, Rapid sequence and Cricoid pressure planned  PONV Risk Score and Plan: 3 and Ondansetron, Dexamethasone and Midazolam  Airway Management Planned: Oral ETT  Additional Equipment:   Intra-op Plan:   Post-operative Plan: Extubation in OR  Informed Consent: I have reviewed the patients History and Physical, chart, labs and discussed the procedure including the risks, benefits and alternatives for the proposed anesthesia with the patient or authorized representative who has indicated his/her understanding and acceptance.     Dental advisory given  Plan Discussed with: CRNA  Anesthesia Plan Comments:         Anesthesia Quick Evaluation

## 2020-03-21 NOTE — Op Note (Signed)
  03/21/2020  3:34 PM  PATIENT:  Deborah [name]  64 y.o. female  PRE-OPERATIVE DIAGNOSIS:  incarcerated left inguinal hernia, SBO  POST-OPERATIVE DIAGNOSIS:  incarcerated left inguinal hernia, SBO, necrotic knuckle of small bowel  PROCEDURE:  Procedure(s): HERNIA REPAIR INGUINAL INCARCERATED SMALL BOWEL RESECTION  SURGEON:  Surgeon(s): Violeta Gelinas, MD  ASSISTANTS: none   ANESTHESIA:   local and general  EBL:  No intake/output data recorded.  BLOOD ADMINISTERED:none  DRAINS: none   SPECIMEN:  Excision  DISPOSITION OF SPECIMEN:  PATHOLOGY  COUNTS:  YES  DICTATION: .Dragon Dictation Findings: Incarcerated left inguinal hernia with knuckle of small bowel, the small bowel was necrotic but had not perforated  Procedure in detail: Informed consent was obtained.  She received intravenous antibiotics.  She was brought to the operating room and general endotracheal anesthesia was administered by the anesthesia staff.  Her abdomen and groins were prepped and draped in a sterile fashion.  We did a timeout procedure.  I injected local along the planned line of incision in the left inguinal region.  I also injected out towards the anterior superior iliac spine.  I made a left inguinal incision and dissected down through the subcutaneous tissues using cautery for hemostasis.  Identified the hernia sac.  The external oblique fascia was opened laterally but was very attenuated and blown out from this hernia.  I circumferentially dissected this hernia sac and then gently dissected it to open it.  The hernia sac contained bloody fluid as well as a small segment of small bowel which was necrotic but had not perforated.  I excised the sac and I opened the hernia defect a little bit laterally in order to deliver more small bowel into the wound.  Once I did this it was obvious this portion of the small bowel needed to be resected.  I divided the small bowel proximally and distally with GIA-75  stapler.  I took down the mesentery with Kellys and suture ligatures.  I then performed a side-to-side small bowel anastomosis using GIA 75 stapler.  The common defect was closed with a TA 60.  An apical suture of 2-0 silk was also placed.  The staple line was oversewn with multiple 3-0 silk sutures achieving good hemostasis.  The anastomosis was viable and enteric contents easily passed back and forth without leaking.  We changed our gloves.  The small bowel was placed back into the abdomen and then ensured all of the bowel was freed up and well away from the hernia defect.  I then closed the hernia defect primarily using interrupted 2-0 Prolene sutures to bring the transversalis down to what was left of the shelving edge of the inguinal ligament and Cooper's ligament.  I used multiple 2-0 Prolene's.  I then placed additional local anesthetic.  I irrigated out the area.  What was left of the external oblique was closed with a running 2-0 Vicryl.  Subcutaneous tissues were approximated with interrupted 3-0 Vicryl.  The skin was closed with running 4-0 Monocryl followed by Dermabond.  All counts were correct.  She tolerated the procedure well without apparent complication and was taken recovery in stable condition. PATIENT DISPOSITION:  PACU - hemodynamically stable.   Delay start of Pharmacological VTE agent (>24hrs) due to surgical blood loss or risk of bleeding:  no  Violeta Gelinas, MD, [name], [name] Pager: [phone]  8/21/20213:34 PM

## 2020-03-21 NOTE — Anesthesia Procedure Notes (Signed)
Procedure Name: Intubation Date/Time: 03/21/2020 2:20 PM Performed by: Clearnce Sorrel, CRNA Pre-anesthesia Checklist: Patient identified, Emergency Drugs available, Suction available, Patient being monitored and Timeout performed Patient Re-evaluated:Patient Re-evaluated prior to induction Oxygen Delivery Method: Circle system utilized Preoxygenation: Pre-oxygenation with 100% oxygen Induction Type: IV induction, Rapid sequence and Cricoid Pressure applied Ventilation: Mask ventilation without difficulty Laryngoscope Size: Mac and 3 Grade View: Grade I Tube type: Oral Tube size: 7.0 mm Number of attempts: 1 Airway Equipment and Method: Stylet Placement Confirmation: ETT inserted through vocal cords under direct vision,  positive ETCO2 and breath sounds checked- equal and bilateral Secured at: 22 cm Tube secured with: Tape Dental Injury: Teeth and Oropharynx as per pre-operative assessment

## 2020-03-21 NOTE — Transfer of Care (Signed)
Immediate Anesthesia Transfer of Care Note  Patient: Deborah [name]  Procedure(s) Performed: REPAIR OF  LEFT  INGUINAL INCARCERATED HERNIA  WITH SMALL BOWEL RESECTION (Left Abdomen)  Patient Location: PACU  Anesthesia Type:General  Level of Consciousness: awake, alert  and oriented  Airway & Oxygen Therapy: Patient Spontanous Breathing  Post-op Assessment: Report given to RN and Post -op Vital signs reviewed and stable  Post vital signs: Reviewed and stable  Last Vitals:  Vitals Value Taken Time  BP 154/81 03/21/20 1543  Temp    Pulse 75 03/21/20 1548  Resp 17 03/21/20 1548  SpO2 100 % 03/21/20 1548  Vitals shown include unvalidated device data.  Last Pain:  Vitals:   03/21/20 1149  TempSrc:   PainSc: 0-No pain         Complications: No complications documented.

## 2020-03-21 NOTE — Anesthesia Postprocedure Evaluation (Signed)
Anesthesia Post Note  Patient: Deborah [name]  Procedure(s) Performed: REPAIR OF  LEFT  INGUINAL INCARCERATED HERNIA  WITH SMALL BOWEL RESECTION (Left Abdomen)     Patient location during evaluation: PACU Anesthesia Type: General Level of consciousness: awake and alert Pain management: pain level controlled Vital Signs Assessment: post-procedure vital signs reviewed and stable Respiratory status: spontaneous breathing, nonlabored ventilation and respiratory function stable Cardiovascular status: blood pressure returned to baseline and stable Postop Assessment: no apparent nausea or vomiting Anesthetic complications: no   No complications documented.  Last Vitals:  Vitals:   03/21/20 1630 03/21/20 1700  BP: (!) 160/96 130/75  Pulse: 76 83  Resp: 20 15  Temp:    SpO2: 99% 97%                  Ocean Schildt,W. EDMOND
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
## 2020-03-22 ENCOUNTER — Encounter (HOSPITAL_COMMUNITY): Payer: Self-pay | Admitting: General Surgery

## 2020-03-22 LAB — CBC
HCT: 34.7 % — ABNORMAL LOW (ref 36.0–46.0)
Hemoglobin: 11.4 g/dL — ABNORMAL LOW (ref 12.0–15.0)
MCH: 30.3 pg (ref 26.0–34.0)
MCHC: 32.9 g/dL (ref 30.0–36.0)
MCV: 92.3 fL (ref 80.0–100.0)
Platelets: 190 10*3/uL (ref 150–400)
RBC: 3.76 MIL/uL — ABNORMAL LOW (ref 3.87–5.11)
RDW: 12.8 % (ref 11.5–15.5)
WBC: 10.8 10*3/uL — ABNORMAL HIGH (ref 4.0–10.5)
nRBC: 0 % (ref 0.0–0.2)

## 2020-03-22 LAB — BASIC METABOLIC PANEL
Anion gap: 12 (ref 5–15)
BUN: 22 mg/dL (ref 8–23)
CO2: 23 mmol/L (ref 22–32)
Calcium: 8.3 mg/dL — ABNORMAL LOW (ref 8.9–10.3)
Chloride: 102 mmol/L (ref 98–111)
Creatinine, Ser: 0.75 mg/dL (ref 0.44–1.00)
GFR calc Af Amer: >60 mL/min (ref >60)
GFR calc non Af Amer: >60 mL/min (ref >60)
Glucose, Bld: 109 mg/dL — ABNORMAL HIGH (ref 70–99)
Potassium: 4.3 mmol/L (ref 3.5–5.1)
Sodium: 137 mmol/L (ref 135–145)

## 2020-03-22 MED ORDER — IBUPROFEN 600 MG PO TABS
600.0000 mg | ORAL_TABLET | Freq: Four times a day (QID) | ORAL | Status: DC | PRN
  Filled 2020-03-22: qty 1

## 2020-03-22 MED ORDER — NALOXEGOL OXALATE 12.5 MG PO TABS
12.5000 mg | ORAL_TABLET | Freq: Every day | ORAL | Status: DC
  Filled 2020-03-22 (×2): qty 1

## 2020-03-22 MED ORDER — ADULT MULTIVITAMIN W/MINERALS CH
1.0000 | ORAL_TABLET | Freq: Every day | ORAL | Status: DC
  Administered 2020-03-22 – 2020-03-25 (×4): 1 via ORAL
  Filled 2020-03-22 (×4): qty 1

## 2020-03-22 MED ORDER — BUPROPION HCL ER (XL) 150 MG PO TB24
300.0000 mg | ORAL_TABLET | Freq: Every day | ORAL | Status: DC
  Administered 2020-03-22 – 2020-03-25 (×4): 300 mg via ORAL
  Filled 2020-03-22 (×4): qty 2

## 2020-03-22 MED ORDER — OXYCODONE HCL 5 MG PO TABS: 10.0000 mg | ORAL_TABLET | Freq: Four times a day (QID) | ORAL | Status: DC | PRN

## 2020-03-22 MED ORDER — BUPRENORPHINE HCL-NALOXONE HCL 8-2 MG SL SUBL
1.0000 | SUBLINGUAL_TABLET | Freq: Every day | SUBLINGUAL | Status: DC
  Administered 2020-03-22: 1 via SUBLINGUAL
  Filled 2020-03-22: qty 1
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
## 2020-03-22 NOTE — Progress Notes (Signed)
PT Cancellation Note  Patient Details Name: Deborah [name] MRN: [id number] DOB: [birth year]   Cancelled Treatment:    Reason Eval/Treat Not Completed: Patient declined, no reason specified. States she has been up to bathroom and walked out in hallway. Declines further mobility at this time. Is agreeable to checking back with her tomorrow. Will re-attempt tomorrow.     Jasneet Schobert 03/22/2020, 3:16 PM

## 2020-03-23 ENCOUNTER — Encounter: Payer: Self-pay | Admitting: Orthopedic Surgery

## 2020-03-23 LAB — BASIC METABOLIC PANEL
Anion gap: 8 (ref 5–15)
BUN: 15 mg/dL (ref 8–23)
CO2: 24 mmol/L (ref 22–32)
Calcium: 8.1 mg/dL — ABNORMAL LOW (ref 8.9–10.3)
Chloride: 105 mmol/L (ref 98–111)
Creatinine, Ser: 0.67 mg/dL (ref 0.44–1.00)
GFR calc Af Amer: >60 mL/min (ref >60)
GFR calc non Af Amer: >60 mL/min (ref >60)
Glucose, Bld: 109 mg/dL — ABNORMAL HIGH (ref 70–99)
Potassium: 4.2 mmol/L (ref 3.5–5.1)
Sodium: 137 mmol/L (ref 135–145)

## 2020-03-23 LAB — CBC
HCT: 36.5 % (ref 36.0–46.0)
Hemoglobin: 11.3 g/dL — ABNORMAL LOW (ref 12.0–15.0)
MCH: 30.2 pg (ref 26.0–34.0)
MCHC: 31 g/dL (ref 30.0–36.0)
MCV: 97.6 fL (ref 80.0–100.0)
Platelets: 191 10*3/uL (ref 150–400)
RBC: 3.74 MIL/uL — ABNORMAL LOW (ref 3.87–5.11)
RDW: 12.7 % (ref 11.5–15.5)
WBC: 8.1 10*3/uL (ref 4.0–10.5)
nRBC: 0 % (ref 0.0–0.2)

## 2020-03-23 LAB — URINE CULTURE: Culture: <10000 — AB

## 2020-03-23 MED ORDER — DOCUSATE SODIUM 100 MG PO CAPS
100.0000 mg | ORAL_CAPSULE | Freq: Two times a day (BID) | ORAL | Status: DC
  Administered 2020-03-23 – 2020-03-25 (×5): 100 mg via ORAL
  Filled 2020-03-23 (×5): qty 1

## 2020-03-23 MED ORDER — BUPRENORPHINE HCL-NALOXONE HCL 8-2 MG SL SUBL
1.0000 | SUBLINGUAL_TABLET | Freq: Two times a day (BID) | SUBLINGUAL | Status: DC
  Administered 2020-03-23 – 2020-03-25 (×5): 1 via SUBLINGUAL
  Filled 2020-03-23 (×5): qty 1

## 2020-03-23 MED ORDER — ACETAMINOPHEN 325 MG PO TABS
650.0000 mg | ORAL_TABLET | Freq: Four times a day (QID) | ORAL | Status: DC
  Administered 2020-03-24 (×2): 650 mg via ORAL
  Filled 2020-03-23 (×5): qty 2

## 2020-03-23 MED ORDER — HYDROMORPHONE HCL 1 MG/ML IJ SOLN: 1.0000 mg | INTRAMUSCULAR | Status: DC | PRN

## 2020-03-23 MED ORDER — OXYCODONE HCL 5 MG PO TABS
10.0000 mg | ORAL_TABLET | Freq: Four times a day (QID) | ORAL | Status: DC | PRN
  Administered 2020-03-23: 15 mg via ORAL
  Administered 2020-03-23: 10 mg via ORAL
  Administered 2020-03-24 (×2): 15 mg via ORAL
  Administered 2020-03-24: 10 mg via ORAL
  Administered 2020-03-25 (×2): 15 mg via ORAL
  Filled 2020-03-23 (×6): qty 3
  Filled 2020-03-23: qty 2

## 2020-03-23 MED ORDER — POLYETHYLENE GLYCOL 3350 17 G PO PACK
17.0000 g | PACK | Freq: Every day | ORAL | Status: DC
  Administered 2020-03-23: 17 g via ORAL
  Filled 2020-03-23: qty 1

## 2020-03-23 MED ORDER — METHOCARBAMOL 500 MG PO TABS
500.0000 mg | ORAL_TABLET | Freq: Two times a day (BID) | ORAL | Status: DC
  Administered 2020-03-23 (×2): 500 mg via ORAL
  Filled 2020-03-23 (×4): qty 1

## 2020-03-23 MED ORDER — HYDROMORPHONE HCL 1 MG/ML IJ SOLN
1.0000 mg | INTRAMUSCULAR | Status: DC | PRN
  Administered 2020-03-23 – 2020-03-24 (×4): 1 mg via INTRAVENOUS
  Filled 2020-03-23 (×4): qty 1

## 2020-03-23 MED ORDER — ACETAMINOPHEN 325 MG PO TABS
650.0000 mg | ORAL_TABLET | Freq: Four times a day (QID) | ORAL | Status: DC | PRN
  Administered 2020-03-23: 650 mg via ORAL
  Filled 2020-03-23: qty 2

## 2020-03-23 MED ORDER — POLYETHYLENE GLYCOL 3350 17 G PO PACK: 17.0000 g | PACK | Freq: Every day | ORAL | Status: DC | PRN

## 2020-03-23 NOTE — Social Work (Signed)
CSW noted pt was to start with HHPT w/ [facility] starting Wednesday. Let Deborah [name] liaison know pt was admitted. They will follow for discharge support.   Octavio Graves, MSW, LCSW [facility] Clinical Social Work

## 2020-03-23 NOTE — Progress Notes (Signed)
PT Cancellation Note  Patient Details Name: [name] MRN: [id number] DOB: January 23, 1956   Cancelled Treatment:    Reason Eval/Treat Not Completed: Fatigue/lethargy limiting ability to participate;Pain limiting ability to participate - pt reporting fatigue and significant abdominal pain, requests PT to check back as PT schedule allows this afternoon.  Richrd Sox, PT Acute Rehabilitation Services Pager [phone]  Office [phone]    Nicola Police 03/23/2020, 12:35 PM
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
## 2020-03-23 NOTE — Plan of Care (Signed)
  Problem: Education: Goal: Knowledge of General Education information will improve Description Including pain rating scale, medication(s)/side effects and non-pharmacologic comfort measures Outcome: Progressing   

## 2020-03-23 NOTE — Progress Notes (Signed)
[facility] Progress Note  2 Days Post-Op  Subjective: CC-  Upset with her care. States that she is not getting all of her home narcotics. States that most of her pain is due to chronic back pain, but she is also having some left groin pain. States that she never passes flatus. No BM. Denies bloating, nausea, vomiting. Currently taking in ice chips. States that she is starving. Refusing to take movantik.  Objective: Vital signs in last 24 hours: Temp:  [97.8 F (36.6 C)-98.5 F (36.9 C)] 98.2 F (36.8 C) (08/23 0456) Pulse Rate:  [66-78] 71 (08/23 0456) Resp:  [16-18] 16 (08/23 0456) BP: (106-139)/(64-83) 116/71 (08/23 0456) SpO2:  [92 %-100 %] 99 % (08/23 0456)    Intake/Output from previous day: 08/22 0701 - 08/23 0700 In: 2870.3 [P.O.:60; I.V.:2810.3] Out: -  Intake/Output this shift: No intake/output data recorded.  PE: Gen:  Alert, NAD Pulm: rate and effort normal Abd: Soft, NT/ND, +BS, left groin incision cdi without erythema or drainage  Lab Results:  Recent Labs    03/22/20 0158 03/23/20 0127  WBC 10.8* 8.1  HGB 11.4* 11.3*  HCT 34.7* 36.5  PLT 190 191   BMET Recent Labs    03/22/20 0158 03/23/20 0127  NA 137 137  K 4.3 4.2  CL 102 105  CO2 23 24  GLUCOSE 109* 109*  BUN 22 15  CREATININE 0.75 0.67  CALCIUM 8.3* 8.1*   PT/INR No results for input(s): LABPROT, INR in the last 72 hours. CMP     Component Value Date/Time   NA 137 03/23/2020 0127   NA 145 05/29/2017 0000   K 4.2 03/23/2020 0127   CL 105 03/23/2020 0127   CO2 24 03/23/2020 0127   GLUCOSE 109 (H) 03/23/2020 0127   BUN 15 03/23/2020 0127   BUN 23 (A) 05/29/2017 0000   CREATININE 0.67 03/23/2020 0127   CALCIUM 8.1 (L) 03/23/2020 0127   PROT 6.3 (L) 03/21/2020 0736   ALBUMIN 4.3 03/21/2020 0736   AST 22 03/21/2020 0736   ALT 20 03/21/2020 0736   ALKPHOS 58 03/21/2020 0736   BILITOT 1.2 03/21/2020 0736   GFRNONAA >60 03/23/2020 0127   GFRAA >60 03/23/2020 0127    Lipase     Component Value Date/Time   LIPASE 20 03/21/2020 0736       Studies/Results: CT ABDOMEN PELVIS W CONTRAST  Result Date: 03/21/2020 CLINICAL DATA:  Abdominal pain with hernia suspected. Severe abdominal pain with left inguinal bulge. EXAM: CT ABDOMEN AND PELVIS WITH CONTRAST TECHNIQUE: Multidetector CT imaging of the abdomen and pelvis was performed using the standard protocol following bolus administration of intravenous contrast. CONTRAST:  OMNIPAQUE IOHEXOL 300 MG/ML  SOLN COMPARISON:  None. FINDINGS: Lower chest: No contributory findings. Small sliding hiatal hernia. Hepatobiliary: No focal liver abnormality.No evidence of biliary obstruction or stone. Pancreas: Unremarkable. Spleen: Unremarkable. Adrenals/Urinary Tract: Negative adrenals. No hydronephrosis or stone. Unremarkable bladder. Stomach/Bowel: Confirmed left groin hernia with a herniated knuckle of small bowel which is edematous. No detected wall defect with faint continuous enhancement present. There is trapped fluid within the hernia sac. Dilated small bowel proximal to the hernia. Vascular/Lymphatic: Atheromatous calcification. No mass or adenopathy. Reproductive:No pathologic findings. Other: No ascites or pneumoperitoneum. Musculoskeletal: Marked levoscoliosis with superimposed degenerative disease and L4-5 anterolisthesis. Defect from prior femoral nail fixation on the left IMPRESSION: Confirmed left groin hernia containing a knuckle of small bowel which is inflamed/edematous and obstructive. Electronically Signed   By: Marja Kays  Watts M.D.   On: 03/21/2020 09:37    Anti-infectives: Anti-infectives (From admission, onward)   Start     Dose/Rate Route Frequency Ordered Stop   03/21/20 1345  ceFAZolin (ANCEF) IVPB 2g/100 mL premix        2 g 200 mL/hr over 30 Minutes Intravenous  Once 03/21/20 1153 03/21/20 1421   03/21/20 1200  ceFAZolin (ANCEF) IVPB 2g/100 mL premix  Status:  Discontinued        2 g 200  mL/hr over 30 Minutes Intravenous  Once 03/21/20 1153 03/21/20 1153       Assessment/Plan Depression Chronic pain on chronic narcotics - Dr. Mikael Spray is her PCP and narcotic prescriber  Incarcerated left inguinal hernia, SBO, necrotic knuckle of small bowel -S/p HERNIA REPAIR INGUINAL INCARCERATED, SMALL BOWEL RESECTION 8/21 Dr. Janee Morn - POD#2 - needs to mobilize more, PT consult pending - Advance to clear liquid diet and as tolerated to full liquids. Increase oxy scale to home dose and decrease IV dilaudid. Refusing movantik so I have switched this to miralax and colace.  ID - ancef periop FEN - IVF, CLD ADAT to FLD VTE - SCDs, lovenox Foley - none Follow up - [facility] clinic   LOS: 2 days    Franne Forts, [facility] 03/23/2020, 8:54 AM Please see Amion for pager number during day hours 7:00am-4:30pm

## 2020-03-23 NOTE — Discharge Instructions (Signed)
[facility] _______[facility] °INGUINAL HERNIA REPAIR: POST OP INSTRUCTIONS ° °Always review your discharge instruction sheet given to you by the facility where your surgery was performed. °IF YOU HAVE DISABILITY OR FAMILY LEAVE FORMS, YOU MUST BRING THEM TO THE OFFICE FOR PROCESSING.   °DO NOT GIVE THEM TO YOUR DOCTOR. ° °1. A  prescription for pain medication may be given to you upon discharge.  Take your pain medication as prescribed, if needed.  If narcotic pain medicine is not needed, then you may take acetaminophen (Tylenol) or ibuprofen (Advil) as needed. °2. Take your usually prescribed medications unless otherwise directed. °If you need a refill on your pain medication, please contact your pharmacy.  They will contact our office to request authorization. Prescriptions will not be filled after 5 pm or on week-ends. °3. You should follow a light diet the first 24 hours after arrival home, such as soup and crackers, etc.  Be sure to include lots of fluids daily.  Resume your normal diet the day after surgery. °4.Most patients will experience some swelling and bruising around the umbilicus or in the groin and scrotum.  Ice packs and reclining will help.  Swelling and bruising can take several days to resolve.  °6. It is common to experience some constipation if taking pain medication after surgery.  Increasing fluid intake and taking a stool softener (such as Colace) will usually help or prevent this problem from occurring.  A mild laxative (Milk of Magnesia or Miralax) should be taken according to package directions if there are no bowel movements after 48 hours. °7. Unless discharge instructions indicate otherwise, you may remove your bandages 24-48 hours after surgery, and you may shower at that time.  You may have steri-strips (small skin tapes) in place directly over the incision.  These strips should be left on the skin for 7-10 days.  If your surgeon used skin glue on the incision, you may  shower in 24 hours.  The glue will flake off over the next 2-3 weeks.  Any sutures or staples will be removed at the office during your follow-up visit. °8. ACTIVITIES:  You may resume regular (light) daily activities beginning the next day--such as daily self-care, walking, climbing stairs--gradually increasing activities as tolerated.  You may have sexual intercourse when it is comfortable.  Refrain from any heavy lifting or straining until approved by your doctor. ° °a.You may drive when you are no longer taking prescription pain medication, you can comfortably wear a seatbelt, and you can safely maneuver your car and apply brakes. °b.RETURN TO WORK:   °_____________________________________________ ° °9.You should see your doctor in the office for a follow-up appointment approximately 2-3 weeks after your surgery.  Make sure that you call for this appointment within a day or two after you arrive home to insure a convenient appointment time. °10.OTHER INSTRUCTIONS: _________________________ °   _____________________________________ ° °WHEN TO CALL YOUR DOCTOR: °1. Fever over 101.0 °2. Inability to urinate °3. Nausea and/or vomiting °4. Extreme swelling or bruising °5. Continued bleeding from incision. °6. Increased pain, redness, or drainage from the incision ° °The clinic staff is available to answer your questions during regular business hours.  Please don’t hesitate to call and ask to speak to one of the nurses for clinical concerns.  If you have a medical emergency, go to the nearest emergency room or call 911.  A surgeon from [facility] is always on call at the hospital ° ° °[address], [address], [place], [place]  [address] ? ° [address] [address], [place], [place]   [address] °[phone] ? [phone] ? FAX [phone] °Web site: [email or web address] ° °
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
## 2020-03-24 ENCOUNTER — Ambulatory Visit: Payer: Medicare Other

## 2020-03-24 LAB — SURGICAL PATHOLOGY

## 2020-03-24 MED ORDER — PANTOPRAZOLE SODIUM 40 MG PO TBEC
40.0000 mg | DELAYED_RELEASE_TABLET | Freq: Every day | ORAL | Status: DC
  Administered 2020-03-24: 40 mg via ORAL
  Filled 2020-03-24: qty 1

## 2020-03-24 MED ORDER — ALUM & MAG HYDROXIDE-SIMETH 200-200-20 MG/5ML PO SUSP
30.0000 mL | ORAL | Status: DC | PRN
  Administered 2020-03-24: 30 mL via ORAL
  Filled 2020-03-24: qty 30

## 2020-03-24 MED ORDER — PROMETHAZINE HCL 25 MG/ML IJ SOLN: 12.5000 mg | Freq: Four times a day (QID) | INTRAMUSCULAR | Status: DC | PRN

## 2020-03-24 MED ORDER — BISACODYL 10 MG RE SUPP: 10.0000 mg | Freq: Every day | RECTAL | Status: DC | PRN

## 2020-03-24 MED ORDER — HYDROMORPHONE HCL 1 MG/ML IJ SOLN
1.0000 mg | Freq: Four times a day (QID) | INTRAMUSCULAR | Status: DC | PRN
  Administered 2020-03-24 – 2020-03-25 (×4): 1 mg via INTRAVENOUS
  Filled 2020-03-24 (×4): qty 1

## 2020-03-24 MED ORDER — PANTOPRAZOLE SODIUM 40 MG PO TBEC: 40.0000 mg | DELAYED_RELEASE_TABLET | Freq: Every day | ORAL | Status: DC

## 2020-03-24 MED ORDER — ENSURE ENLIVE PO LIQD
237.0000 mL | Freq: Two times a day (BID) | ORAL | Status: DC
  Administered 2020-03-24: 237 mL via ORAL

## 2020-03-24 MED ORDER — MAGNESIUM CITRATE PO SOLN
0.5000 | Freq: Once | ORAL | Status: AC
  Administered 2020-03-24: 0.5 via ORAL
  Filled 2020-03-24: qty 296

## 2020-03-24 MED ORDER — FAMOTIDINE 20 MG PO TABS
40.0000 mg | ORAL_TABLET | Freq: Every day | ORAL | Status: DC | PRN
  Administered 2020-03-24 – 2020-03-25 (×3): 40 mg via ORAL
  Filled 2020-03-24 (×3): qty 2

## 2020-03-24 NOTE — Progress Notes (Signed)
[facility] Progress Note  3 Days Post-Op  Subjective: CC-  Continues to hurt a lot, mostly her chronic back pain. Also has some left groin discomfort. States that she was nauseated last night, no emesis. Tolerating clear liquids. Feeling hungry. No flatus or BM. Denies abdominal bloating. Initially told me that she was going home today, then stated that she cannot pick up her narcotic prescriptions until tomorrow so she wants to stay until tomorrow.  Objective: Vital signs in last 24 hours: Temp:  [97.7 F (36.5 C)-98.2 F (36.8 C)] 98 F (36.7 C) (08/24 4580) Pulse Rate:  [65-77] 65 (08/24 0613) Resp:  [16-18] 17 (08/24 0613) BP: (103-113)/(64-72) 105/64 (08/24 0613) SpO2:  [96 %-98 %] 96 % (08/24 9983)    Intake/Output from previous day: 08/23 0701 - 08/24 0700 In: 280 [P.O.:280] Out: -  Intake/Output this shift: No intake/output data recorded.  PE: Gen:  Alert, NAD Pulm: rate and effort normal Abd: Soft, NT/ND, +BS, left groin incision cdi without erythema or drainage   Lab Results:  Recent Labs    03/22/20 0158 03/23/20 0127  WBC 10.8* 8.1  HGB 11.4* 11.3*  HCT 34.7* 36.5  PLT 190 191   BMET Recent Labs    03/22/20 0158 03/23/20 0127  NA 137 137  K 4.3 4.2  CL 102 105  CO2 23 24  GLUCOSE 109* 109*  BUN 22 15  CREATININE 0.75 0.67  CALCIUM 8.3* 8.1*   PT/INR No results for input(s): LABPROT, INR in the last 72 hours. CMP     Component Value Date/Time   NA 137 03/23/2020 0127   NA 145 05/29/2017 0000   K 4.2 03/23/2020 0127   CL 105 03/23/2020 0127   CO2 24 03/23/2020 0127   GLUCOSE 109 (H) 03/23/2020 0127   BUN 15 03/23/2020 0127   BUN 23 (A) 05/29/2017 0000   CREATININE 0.67 03/23/2020 0127   CALCIUM 8.1 (L) 03/23/2020 0127   PROT 6.3 (L) 03/21/2020 0736   ALBUMIN 4.3 03/21/2020 0736   AST 22 03/21/2020 0736   ALT 20 03/21/2020 0736   ALKPHOS 58 03/21/2020 0736   BILITOT 1.2 03/21/2020 0736   GFRNONAA >60 03/23/2020 0127    GFRAA >60 03/23/2020 0127   Lipase     Component Value Date/Time   LIPASE 20 03/21/2020 0736       Studies/Results: No results found.  Anti-infectives: Anti-infectives (From admission, onward)   Start     Dose/Rate Route Frequency Ordered Stop   03/21/20 1345  ceFAZolin (ANCEF) IVPB 2g/100 mL premix        2 g 200 mL/hr over 30 Minutes Intravenous  Once 03/21/20 1153 03/21/20 1421   03/21/20 1200  ceFAZolin (ANCEF) IVPB 2g/100 mL premix  Status:  Discontinued        2 g 200 mL/hr over 30 Minutes Intravenous  Once 03/21/20 1153 03/21/20 1153       Assessment/Plan Depression Chronic pain on chronic narcotics - Dr. Mikael Spray is her PCP and narcotic prescriber  Incarcerated left inguinal hernia, SBO, necrotic knuckle of small bowel -S/p HERNIA REPAIR INGUINAL INCARCERATED, SMALL BOWEL RESECTION 8/21 Dr. Janee Morn - POD#3 - mobilize, continue PT. Will also ask OT to see. Home health PT order placed, TOC team on board - Advance to full liquids. Add Ensure. Magnesium citrate today for constipation. May need dulcolax suppository. May be ready for discharge tomorrow.  ID - ancef periop FEN - decrease IVF, FLD, Ensure VTE - SCDs, lovenox  Foley - none Follow up - [facility] clinic    LOS: 3 days    Franne Forts, [facility] 03/24/2020, 9:32 AM Please see Amion for pager number during day hours 7:00am-4:30pm

## 2020-03-25 MED ORDER — ACETAMINOPHEN 325 MG PO TABS: 1000.0000 mg | ORAL_TABLET | Freq: Three times a day (TID) | ORAL | Status: DC | PRN

## 2020-03-25 MED ORDER — DOCUSATE SODIUM 100 MG PO CAPS: 100.0000 mg | ORAL_CAPSULE | Freq: Two times a day (BID) | ORAL | 2 refills | Status: DC | PRN

## 2020-03-25 MED ORDER — POLYETHYLENE GLYCOL 3350 17 G PO PACK: 17.0000 g | PACK | Freq: Every day | ORAL | 0 refills | Status: DC | PRN

## 2020-03-25 MED ORDER — ONDANSETRON 4 MG PO TBDP: 4.0000 mg | ORAL_TABLET | Freq: Four times a day (QID) | ORAL | 0 refills | Status: DC | PRN

## 2020-03-25 NOTE — Progress Notes (Signed)
PT Cancellation Note  Patient Details Name: Deborah [name] MRN: [id number] DOB: 01-07-1956   Cancelled Treatment:    Reason Eval/Treat Not Completed: Other (comment) - Pt adamantly refused OOB mobility this am, states "I am just trying to get out of here".   Richrd Sox, PT Acute Rehabilitation Services Pager [phone]  Office [phone]    Tyrone Apple D Despina Hidden 03/25/2020, 9:32 AM

## 2020-03-25 NOTE — Progress Notes (Signed)
OT Cancellation Note  Patient Details Name: FARA WORTHY MRN: [id number] DOB: [birth year]   Cancelled Treatment:    Reason Eval/Treat Not Completed: Patient declined, no reason specified. Pt declined OT services. "I'm good, thanks." Pt reports she has been through extensive rehab in the past and feels she has no acute OT needs.   Raynald Kemp, OT Acute Rehabilitation Services Pager: [phone] Office: [phone]  03/25/2020, 9:13 AM

## 2020-03-25 NOTE — Care Management (Addendum)
Spoke to patient via phone. She has requested NCM to cancel home health services.    Drew with [facility] aware.  Ronny Flurry RN

## 2020-03-25 NOTE — Progress Notes (Signed)
Pt ready for Dc to home. Understands all DC instructions and AVS given.

## 2020-03-25 NOTE — Progress Notes (Signed)
Pt ready for DC to home by sister.  Pt understands home care.
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
## 2020-03-26 ENCOUNTER — Ambulatory Visit: Payer: Medicare Other | Admitting: Orthopedic Surgery

## 2020-03-29 ENCOUNTER — Telehealth: Payer: Self-pay | Admitting: Surgery

## 2020-03-29 ENCOUNTER — Encounter (HOSPITAL_COMMUNITY): Payer: Self-pay | Admitting: Emergency Medicine

## 2020-03-29 ENCOUNTER — Inpatient Hospital Stay (HOSPITAL_COMMUNITY): Payer: Medicare Other

## 2020-03-29 ENCOUNTER — Emergency Department (HOSPITAL_COMMUNITY): Payer: Medicare Other

## 2020-03-29 ENCOUNTER — Inpatient Hospital Stay (HOSPITAL_COMMUNITY)
Admission: EM | Admit: 2020-03-29 | Discharge: 2020-04-07 | DRG: 389 | Disposition: A | Discharge status: Home / Self Care | Payer: Medicare Other | Attending: Physician Assistant | Admitting: Physician Assistant

## 2020-03-29 DIAGNOSIS — Y92230 Patient room in hospital as the place of occurrence of the external cause: Secondary | ICD-10-CM | POA: Diagnosis not present

## 2020-03-29 DIAGNOSIS — M549 Dorsalgia, unspecified: Secondary | ICD-10-CM | POA: Diagnosis not present

## 2020-03-29 DIAGNOSIS — Z9889 Other specified postprocedural states: Secondary | ICD-10-CM | POA: Diagnosis not present

## 2020-03-29 DIAGNOSIS — K565 Intestinal adhesions [bands], unspecified as to partial versus complete obstruction: Principal | ICD-10-CM | POA: Diagnosis present

## 2020-03-29 DIAGNOSIS — N390 Urinary tract infection, site not specified: Secondary | ICD-10-CM | POA: Diagnosis present

## 2020-03-29 DIAGNOSIS — R358 Other polyuria: Secondary | ICD-10-CM | POA: Diagnosis present

## 2020-03-29 DIAGNOSIS — E875 Hyperkalemia: Secondary | ICD-10-CM | POA: Diagnosis not present

## 2020-03-29 DIAGNOSIS — K219 Gastro-esophageal reflux disease without esophagitis: Secondary | ICD-10-CM | POA: Diagnosis present

## 2020-03-29 DIAGNOSIS — S7002XA Contusion of left hip, initial encounter: Secondary | ICD-10-CM | POA: Diagnosis not present

## 2020-03-29 DIAGNOSIS — G8929 Other chronic pain: Secondary | ICD-10-CM | POA: Diagnosis present

## 2020-03-29 DIAGNOSIS — R112 Nausea with vomiting, unspecified: Secondary | ICD-10-CM

## 2020-03-29 DIAGNOSIS — K56609 Unspecified intestinal obstruction, unspecified as to partial versus complete obstruction: Secondary | ICD-10-CM | POA: Diagnosis present

## 2020-03-29 DIAGNOSIS — R110 Nausea: Secondary | ICD-10-CM

## 2020-03-29 DIAGNOSIS — Z20822 Contact with and (suspected) exposure to covid-19: Secondary | ICD-10-CM | POA: Diagnosis present

## 2020-03-29 DIAGNOSIS — R4781 Slurred speech: Secondary | ICD-10-CM | POA: Diagnosis not present

## 2020-03-29 DIAGNOSIS — Z79891 Long term (current) use of opiate analgesic: Secondary | ICD-10-CM | POA: Diagnosis not present

## 2020-03-29 DIAGNOSIS — W19XXXA Unspecified fall, initial encounter: Secondary | ICD-10-CM | POA: Diagnosis not present

## 2020-03-29 DIAGNOSIS — K567 Ileus, unspecified: Secondary | ICD-10-CM

## 2020-03-29 DIAGNOSIS — K5909 Other constipation: Secondary | ICD-10-CM | POA: Diagnosis present

## 2020-03-29 DIAGNOSIS — Z681 Body mass index (BMI) 19 or less, adult: Secondary | ICD-10-CM

## 2020-03-29 DIAGNOSIS — Z0189 Encounter for other specified special examinations: Secondary | ICD-10-CM

## 2020-03-29 DIAGNOSIS — Z806 Family history of leukemia: Secondary | ICD-10-CM | POA: Diagnosis not present

## 2020-03-29 DIAGNOSIS — R64 Cachexia: Secondary | ICD-10-CM | POA: Diagnosis present

## 2020-03-29 DIAGNOSIS — F329 Major depressive disorder, single episode, unspecified: Secondary | ICD-10-CM | POA: Diagnosis present

## 2020-03-29 DIAGNOSIS — Z881 Allergy status to other antibiotic agents status: Secondary | ICD-10-CM

## 2020-03-29 DIAGNOSIS — F17210 Nicotine dependence, cigarettes, uncomplicated: Secondary | ICD-10-CM | POA: Diagnosis present

## 2020-03-29 DIAGNOSIS — M419 Scoliosis, unspecified: Secondary | ICD-10-CM | POA: Diagnosis present

## 2020-03-29 LAB — COMPREHENSIVE METABOLIC PANEL
ALT: 20 U/L (ref 0–44)
AST: 19 U/L (ref 15–41)
Albumin: 3.5 g/dL (ref 3.5–5.0)
Alkaline Phosphatase: 61 U/L (ref 38–126)
Anion gap: 13 (ref 5–15)
BUN: 12 mg/dL (ref 8–23)
CO2: 24 mmol/L (ref 22–32)
Calcium: 8.7 mg/dL — ABNORMAL LOW (ref 8.9–10.3)
Chloride: 98 mmol/L (ref 98–111)
Creatinine, Ser: 0.64 mg/dL (ref 0.44–1.00)
GFR calc Af Amer: >60 mL/min (ref >60)
GFR calc non Af Amer: >60 mL/min (ref >60)
Glucose, Bld: 95 mg/dL (ref 70–99)
Potassium: 3.8 mmol/L (ref 3.5–5.1)
Sodium: 135 mmol/L (ref 135–145)
Total Bilirubin: 1 mg/dL (ref 0.3–1.2)
Total Protein: 5.6 g/dL — ABNORMAL LOW (ref 6.5–8.1)

## 2020-03-29 LAB — CBC WITH DIFFERENTIAL/PLATELET
Abs Immature Granulocytes: 0.03 10*3/uL (ref 0.00–0.07)
Basophils Absolute: 0 10*3/uL (ref 0.0–0.1)
Basophils Relative: 0 %
Eosinophils Absolute: 0.1 10*3/uL (ref 0.0–0.5)
Eosinophils Relative: 1 %
HCT: 36.4 % (ref 36.0–46.0)
Hemoglobin: 11.8 g/dL — ABNORMAL LOW (ref 12.0–15.0)
Immature Granulocytes: 0 %
Lymphocytes Relative: 10 %
Lymphs Abs: 0.8 10*3/uL (ref 0.7–4.0)
MCH: 30.1 pg (ref 26.0–34.0)
MCHC: 32.4 g/dL (ref 30.0–36.0)
MCV: 92.9 fL (ref 80.0–100.0)
Monocytes Absolute: 1.1 10*3/uL — ABNORMAL HIGH (ref 0.1–1.0)
Monocytes Relative: 14 %
Neutro Abs: 6 10*3/uL (ref 1.7–7.7)
Neutrophils Relative %: 75 %
Platelets: 325 10*3/uL (ref 150–400)
RBC: 3.92 MIL/uL (ref 3.87–5.11)
RDW: 13.1 % (ref 11.5–15.5)
WBC: 8.1 10*3/uL (ref 4.0–10.5)
nRBC: 0 % (ref 0.0–0.2)

## 2020-03-29 LAB — SARS CORONAVIRUS 2 BY RT PCR (HOSPITAL ORDER, PERFORMED IN ~~LOC~~ HOSPITAL LAB): SARS Coronavirus 2: NEGATIVE

## 2020-03-29 LAB — LACTIC ACID, PLASMA: Lactic Acid, Venous: 0.6 mmol/L (ref 0.5–1.9)

## 2020-03-29 MED ORDER — LACTATED RINGERS IV SOLN: INTRAVENOUS | Status: DC

## 2020-03-29 MED ORDER — BUPROPION HCL ER (XL) 150 MG PO TB24
300.0000 mg | ORAL_TABLET | Freq: Every day | ORAL | Status: DC
  Administered 2020-03-30 – 2020-04-07 (×7): 300 mg via ORAL
  Filled 2020-03-29 (×8): qty 2

## 2020-03-29 MED ORDER — BISACODYL 10 MG RE SUPP
10.0000 mg | Freq: Every day | RECTAL | Status: DC
  Administered 2020-03-30 – 2020-04-05 (×7): 10 mg via RECTAL
  Filled 2020-03-29 (×8): qty 1

## 2020-03-29 MED ORDER — POLYETHYLENE GLYCOL 3350 17 G PO PACK: 17.0000 g | PACK | Freq: Every day | ORAL | Status: DC

## 2020-03-29 MED ORDER — AMPHETAMINE-DEXTROAMPHETAMINE 10 MG PO TABS
30.0000 mg | ORAL_TABLET | Freq: Two times a day (BID) | ORAL | Status: DC
  Filled 2020-03-29 (×8): qty 3

## 2020-03-29 MED ORDER — DIATRIZOATE MEGLUMINE & SODIUM 66-10 % PO SOLN
90.0000 mL | Freq: Once | ORAL | Status: AC
  Administered 2020-03-30: 90 mL via NASOGASTRIC
  Filled 2020-03-29 (×2): qty 90

## 2020-03-29 MED ORDER — POTASSIUM CHLORIDE 10 MEQ/100ML IV SOLN
10.0000 meq | INTRAVENOUS | Status: AC
  Administered 2020-03-29: 10 meq via INTRAVENOUS
  Filled 2020-03-29: qty 100

## 2020-03-29 MED ORDER — KETOROLAC TROMETHAMINE 15 MG/ML IJ SOLN
15.0000 mg | Freq: Four times a day (QID) | INTRAMUSCULAR | Status: AC | PRN
  Administered 2020-03-30 – 2020-03-31 (×3): 15 mg via INTRAVENOUS
  Filled 2020-03-29 (×3): qty 1

## 2020-03-29 MED ORDER — ALENDRONATE SODIUM 10 MG PO TABS
40.0000 mg | ORAL_TABLET | ORAL | Status: DC
  Filled 2020-03-29: qty 4

## 2020-03-29 MED ORDER — BUPRENORPHINE HCL 8 MG SL SUBL
8.0000 mg | SUBLINGUAL_TABLET | Freq: Two times a day (BID) | SUBLINGUAL | Status: DC
  Administered 2020-03-29 – 2020-04-07 (×16): 8 mg via SUBLINGUAL
  Filled 2020-03-29 (×18): qty 1

## 2020-03-29 MED ORDER — DOCUSATE SODIUM 100 MG PO CAPS
100.0000 mg | ORAL_CAPSULE | Freq: Two times a day (BID) | ORAL | Status: DC
  Administered 2020-04-01 – 2020-04-07 (×12): 100 mg via ORAL
  Filled 2020-03-29 (×13): qty 1

## 2020-03-29 MED ORDER — PHENOL 1.4 % MT LIQD
1.0000 | OROMUCOSAL | Status: DC | PRN
  Filled 2020-03-29 (×2): qty 177

## 2020-03-29 MED ORDER — IOHEXOL 300 MG/ML  SOLN
100.0000 mL | Freq: Once | INTRAMUSCULAR | Status: AC | PRN
  Administered 2020-03-29: 100 mL via INTRAVENOUS

## 2020-03-29 MED ORDER — ADULT MULTIVITAMIN W/MINERALS CH
1.0000 | ORAL_TABLET | Freq: Every day | ORAL | Status: DC
  Administered 2020-04-01 – 2020-04-07 (×5): 1 via ORAL
  Filled 2020-03-29 (×7): qty 1

## 2020-03-29 MED ORDER — NALOXONE HCL 4 MG/0.1ML NA LIQD
0.4000 mg | Freq: Once | NASAL | Status: DC | PRN
  Filled 2020-03-29: qty 8

## 2020-03-29 MED ORDER — FAMOTIDINE 20 MG PO TABS: 40.0000 mg | ORAL_TABLET | Freq: Four times a day (QID) | ORAL | Status: DC | PRN

## 2020-03-29 MED ORDER — OXYCODONE HCL 5 MG PO TABS
15.0000 mg | ORAL_TABLET | Freq: Three times a day (TID) | ORAL | Status: DC | PRN
  Administered 2020-03-30: 15 mg via ORAL
  Filled 2020-03-29 (×2): qty 3

## 2020-03-29 MED ORDER — ONDANSETRON HCL 4 MG/2ML IJ SOLN
4.0000 mg | Freq: Once | INTRAMUSCULAR | Status: AC
  Administered 2020-03-29: 4 mg via INTRAVENOUS
  Filled 2020-03-29: qty 2

## 2020-03-29 MED ORDER — PANTOPRAZOLE SODIUM 40 MG PO TBEC
40.0000 mg | DELAYED_RELEASE_TABLET | Freq: Every day | ORAL | Status: DC
  Administered 2020-03-30: 40 mg via ORAL
  Filled 2020-03-29: qty 1

## 2020-03-29 MED ORDER — HYDROMORPHONE HCL 1 MG/ML IJ SOLN
1.0000 mg | Freq: Once | INTRAMUSCULAR | Status: AC
  Administered 2020-03-29: 1 mg via INTRAVENOUS
  Filled 2020-03-29: qty 1

## 2020-03-29 MED ORDER — ENOXAPARIN SODIUM 40 MG/0.4ML ~~LOC~~ SOLN
40.0000 mg | SUBCUTANEOUS | Status: DC
  Administered 2020-03-29 – 2020-04-06 (×9): 40 mg via SUBCUTANEOUS
  Filled 2020-03-29 (×9): qty 0.4

## 2020-03-29 MED ORDER — ONDANSETRON 4 MG PO TBDP
4.0000 mg | ORAL_TABLET | Freq: Four times a day (QID) | ORAL | Status: DC | PRN
  Administered 2020-03-31 – 2020-04-06 (×7): 4 mg via ORAL
  Filled 2020-03-29 (×8): qty 1

## 2020-03-29 MED ORDER — HYDROMORPHONE HCL 1 MG/ML IJ SOLN
0.5000 mg | Freq: Once | INTRAMUSCULAR | Status: AC
  Administered 2020-03-29: 0.5 mg via INTRAVENOUS
  Filled 2020-03-29: qty 1

## 2020-03-29 MED ORDER — ACETAMINOPHEN 10 MG/ML IV SOLN
1000.0000 mg | Freq: Four times a day (QID) | INTRAVENOUS | Status: DC
  Administered 2020-03-29 – 2020-03-30 (×2): 1000 mg via INTRAVENOUS
  Filled 2020-03-29 (×4): qty 100

## 2020-03-29 MED ORDER — SODIUM CHLORIDE 0.9 % IV BOLUS
1000.0000 mL | Freq: Once | INTRAVENOUS | Status: AC
  Administered 2020-03-29: 1000 mL via INTRAVENOUS
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
## 2020-03-29 NOTE — ED Triage Notes (Signed)
IV 18g in left forearm. Given Fentanly ., Zofran 4mg . IV Pt. Took 4mg  of Zofran of her own. 500cc fluid.
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
## 2020-03-29 NOTE — ED Notes (Signed)
Deborah [name] [phone]- sister wants call when pt is out of surgery
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
## 2020-03-29 NOTE — ED Triage Notes (Signed)
EMS stated, pt had hernia surgery on the aug. 21 inguinal area . The wound area looks infected. Also has dysuria, rt. Flank area.polyuria, N/V

## 2020-03-30 ENCOUNTER — Inpatient Hospital Stay (HOSPITAL_COMMUNITY): Payer: Medicare Other

## 2020-03-30 ENCOUNTER — Encounter: Payer: Self-pay | Admitting: Surgery

## 2020-03-30 DIAGNOSIS — Z87448 Personal history of other diseases of urinary system: Secondary | ICD-10-CM | POA: Insufficient documentation

## 2020-03-30 LAB — MAGNESIUM: Magnesium: 2 mg/dL (ref 1.7–2.4)

## 2020-03-30 LAB — BASIC METABOLIC PANEL
Anion gap: 13 (ref 5–15)
BUN: 11 mg/dL (ref 8–23)
CO2: 25 mmol/L (ref 22–32)
Calcium: 8.2 mg/dL — ABNORMAL LOW (ref 8.9–10.3)
Chloride: 99 mmol/L (ref 98–111)
Creatinine, Ser: 0.73 mg/dL (ref 0.44–1.00)
GFR calc Af Amer: >60 mL/min (ref >60)
GFR calc non Af Amer: >60 mL/min (ref >60)
Glucose, Bld: 87 mg/dL (ref 70–99)
Potassium: 3.9 mmol/L (ref 3.5–5.1)
Sodium: 137 mmol/L (ref 135–145)

## 2020-03-30 LAB — HIV ANTIBODY (ROUTINE TESTING W REFLEX): HIV Screen 4th Generation wRfx: NONREACTIVE

## 2020-03-30 LAB — URINALYSIS, ROUTINE W REFLEX MICROSCOPIC
Bilirubin Urine: NEGATIVE
Glucose, UA: NEGATIVE mg/dL
Hgb urine dipstick: NEGATIVE
Ketones, ur: 80 mg/dL — AB
Nitrite: POSITIVE — AB
Protein, ur: 100 mg/dL — AB
Specific Gravity, Urine: 1.042 — ABNORMAL HIGH (ref 1.005–1.030)
WBC, UA: >50 WBC/hpf — ABNORMAL HIGH (ref 0–5)
pH: 6 (ref 5.0–8.0)

## 2020-03-30 LAB — PHOSPHORUS: Phosphorus: 3.3 mg/dL (ref 2.5–4.6)

## 2020-03-30 LAB — PREALBUMIN: Prealbumin: 9.2 mg/dL — ABNORMAL LOW (ref 18–38)

## 2020-03-30 LAB — LACTIC ACID, PLASMA: Lactic Acid, Venous: 0.8 mmol/L (ref 0.5–1.9)

## 2020-03-30 MED ORDER — ALUM & MAG HYDROXIDE-SIMETH 200-200-20 MG/5ML PO SUSP
30.0000 mL | Freq: Four times a day (QID) | ORAL | Status: DC | PRN
  Administered 2020-04-02 – 2020-04-04 (×5): 30 mL via ORAL
  Filled 2020-03-30 (×6): qty 30

## 2020-03-30 MED ORDER — ACETAMINOPHEN 10 MG/ML IV SOLN
1000.0000 mg | Freq: Four times a day (QID) | INTRAVENOUS | Status: AC
  Administered 2020-03-30 – 2020-03-31 (×3): 1000 mg via INTRAVENOUS
  Filled 2020-03-30 (×3): qty 100

## 2020-03-30 MED ORDER — MAGIC MOUTHWASH
15.0000 mL | Freq: Four times a day (QID) | ORAL | Status: DC | PRN
  Filled 2020-03-30: qty 15

## 2020-03-30 MED ORDER — MENTHOL 3 MG MT LOZG: 1.0000 | LOZENGE | OROMUCOSAL | Status: DC | PRN

## 2020-03-30 MED ORDER — LORAZEPAM 2 MG/ML IJ SOLN
1.0000 mg | Freq: Four times a day (QID) | INTRAMUSCULAR | Status: DC | PRN
  Administered 2020-03-31 – 2020-04-03 (×6): 1 mg via INTRAVENOUS
  Filled 2020-03-30 (×7): qty 1

## 2020-03-30 MED ORDER — MORPHINE SULFATE (PF) 2 MG/ML IV SOLN
2.0000 mg | INTRAVENOUS | Status: DC | PRN
  Administered 2020-03-30 – 2020-04-07 (×39): 2 mg via INTRAVENOUS
  Filled 2020-03-30 (×40): qty 1

## 2020-03-30 MED ORDER — LIP MEDEX EX OINT
1.0000 "application " | TOPICAL_OINTMENT | Freq: Two times a day (BID) | CUTANEOUS | Status: DC
  Administered 2020-03-30 – 2020-04-07 (×15): 1 via TOPICAL
  Filled 2020-03-30: qty 7

## 2020-03-30 MED ORDER — SODIUM CHLORIDE 0.9 % IV SOLN
1.0000 g | INTRAVENOUS | Status: AC
  Administered 2020-03-30 – 2020-04-04 (×6): 1 g via INTRAVENOUS
  Filled 2020-03-30 (×3): qty 1
  Filled 2020-03-30: qty 10
  Filled 2020-03-30 (×2): qty 1

## 2020-03-30 MED ORDER — SIMETHICONE 40 MG/0.6ML PO SUSP
40.0000 mg | Freq: Four times a day (QID) | ORAL | Status: DC | PRN
  Filled 2020-03-30 (×2): qty 0.6

## 2020-03-30 MED ORDER — BISACODYL 10 MG RE SUPP: 10.0000 mg | Freq: Two times a day (BID) | RECTAL | Status: DC | PRN

## 2020-03-30 NOTE — Progress Notes (Signed)
OT Cancellation Note  Patient Details Name: Deborah [name] MRN: [id number] DOB: [birth year]   Cancelled Treatment:    Reason Eval/Treat Not Completed: Medical issues which prohibited therapy. RN reports to hold session at this time due to patient's current level of pain and lethargy. BP at 88/62, then 94/62. OT will attempt at later time once pt is able to participate.   Marquette Old, MSOT, OTR/L  [facility]  [phone]   Deborah [name] 03/30/2020, 3:18 PM

## 2020-03-30 NOTE — Progress Notes (Signed)
RN reported to [facility] PA that pt was having extreme pain. Pt's diet order currently NPO due to NG tube. [place] PA clarified a different PA may over see pt today, but to administer pt's scheduled suboxene and PRN oxycodone via NG tube. RN administered drugs and flushed NG tube - then clamped NG tube for 30 minutes per protocol. RN will continue to monitor pt.
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
## 2020-03-30 NOTE — Progress Notes (Signed)
Subjective: CC: Patient reports that on Thursday after discharge she began having increased abdominal pain with associated n/v. No BM since d/c. She notes that her pain has improved since NGT placement. She feels pressure in her lower abdomen like she needs to have a BM and did pass one small flatus this AM. She notes dysuria at home.   Patient and patients sister concerned that her hernia was related to patients scoliosis. I discussed I cannot comment on this but directed the conversation to the likely etiology of patients sbo from early adhesions vs ileus post op w/ current plan of treatment.   Objective: Vital signs in last 24 hours: Temp:  [97.7 F (36.5 C)-98.6 F (37 C)] 97.7 F (36.5 C) (08/30 0900) Pulse Rate:  [75-89] 75 (08/30 0900) Resp:  [14-18] 18 (08/30 0900) BP: (108-159)/(73-102) 108/73 (08/30 0900) SpO2:  [97 %-100 %] 100 % (08/30 0900)    Intake/Output from previous day: 08/29 0701 - 08/30 0700 In: 1366 [I.V.:209.6; NG/GT:30; IV Piggyback:1126.4] Out: -  Intake/Output this shift: No intake/output data recorded.  PE: Gen:  Alert, NAD, pleasant HEENT: NGT in place. Bilious output in cannister. Tube flushes easily. The wall device does not turn to suction when adjusted.  Card:  RRR Pulm:  CTA b/l, normal rate and effort  Abd: Soft, lower abdominal distension, mild tenderness across the lower abdomen without peritonitis, +BS, almost hyperactive. Left groin incision c/d/i without erythema or drainage. No recurrence of hernia palpated.  Ext:  No LE edema Psych: A&Ox3  Skin: no rashes noted, warm and dry  Lab Results:  Recent Labs    03/29/20 1118  WBC 8.1  HGB 11.8*  HCT 36.4  PLT 325   BMET Recent Labs    03/29/20 1118 03/30/20 0219  NA 135 137  K 3.8 3.9  CL 98 99  CO2 24 25  GLUCOSE 95 87  BUN 12 11  CREATININE 0.64 0.73  CALCIUM 8.7* 8.2*   PT/INR No results for input(s): LABPROT, INR in the last 72 hours. CMP     Component Value  Date/Time   NA 137 03/30/2020 0219   NA 145 05/29/2017 0000   K 3.9 03/30/2020 0219   CL 99 03/30/2020 0219   CO2 25 03/30/2020 0219   GLUCOSE 87 03/30/2020 0219   BUN 11 03/30/2020 0219   BUN 23 (A) 05/29/2017 0000   CREATININE 0.73 03/30/2020 0219   CALCIUM 8.2 (L) 03/30/2020 0219   PROT 5.6 (L) 03/29/2020 1118   ALBUMIN 3.5 03/29/2020 1118   AST 19 03/29/2020 1118   ALT 20 03/29/2020 1118   ALKPHOS 61 03/29/2020 1118   BILITOT 1.0 03/29/2020 1118   GFRNONAA >60 03/30/2020 0219   GFRAA >60 03/30/2020 0219   Lipase     Component Value Date/Time   LIPASE 20 03/21/2020 0736       Studies/Results: DG Chest 2 View  Result Date: 03/29/2020 CLINICAL DATA:  Dysuria. EXAM: CHEST - 2 VIEW COMPARISON:  March 05, 2017 FINDINGS: Platelike opacity in the right base, likely atelectasis. The heart, hila, mediastinum, lungs, and pleura are otherwise unremarkable. IMPRESSION: Platelike opacity in the right base is favored represent atelectasis, new in the interval. No other acute abnormalities or interval changes. Electronically Signed   By: Gerome Sam III M.D   On: 03/29/2020 11:57   DG Abdomen 1 View  Result Date: 03/29/2020 CLINICAL DATA:  Nasogastric tube placement. EXAM: ABDOMEN - 1 VIEW COMPARISON:  None.  FINDINGS: A nasogastric tube is seen with its distal tip overlying the lateral aspect of the left upper quadrant. Multiple dilated small bowel loops are seen throughout the left abdomen. No radio-opaque calculi or other significant radiographic abnormality are seen. Marked severity scoliosis of the thoracolumbar spine is noted. IMPRESSION: Nasogastric tube positioning, as described above, with additional findings consistent with a small bowel obstruction versus ileus. Electronically Signed   By: Aram Candela M.D.   On: 03/29/2020 21:16   CT ABDOMEN PELVIS W CONTRAST  Result Date: 03/29/2020 CLINICAL DATA:  Acute abdominal pain. Return of pain. Recent small bowel obstruction  surgery 03/21/2020. Technologist notes state wound looks infected. Dysuria and polyuria. EXAM: CT ABDOMEN AND PELVIS WITH CONTRAST TECHNIQUE: Multidetector CT imaging of the abdomen and pelvis was performed using the standard protocol following bolus administration of intravenous contrast. CONTRAST:  OMNIPAQUE IOHEXOL 300 MG/ML  SOLN COMPARISON:  Preoperative CT 03/21/2020 FINDINGS: Lower chest: Linear right lung base and right middle lobe opacities typical of atelectasis. No pleural fluid. Moderate-sized hiatal hernia. Hepatobiliary: No focal liver abnormality is seen. No gallstones, gallbladder wall thickening, or biliary dilatation. Pancreas: Parenchymal atrophy. No ductal dilatation or inflammation. Spleen: Normal in size without focal abnormality. Adrenals/Urinary Tract: No adrenal nodule. No hydronephrosis or perinephric edema. Homogeneous renal enhancement with symmetric excretion on delayed phase imaging. Tiny subcentimeter cyst in the lower right kidney. Urinary bladder is partially distended. Equivocal bladder wall hyperemia and wall thickening. Stomach/Bowel: Small to moderate hiatal hernia. Interval left inguinal hernia repair without evidence of recurrent hernia. Small bowel diffusely dilated and fluid-filled. There is mild diffuse small bowel hyperemia. Small bowel anastomosis noted in the left lower quadrant. Small bowel distal to the anastomosis appear decompressed or less dilated. There is no small bowel pneumatosis. Moderate volume of stool in the ascending and transverse colon. Distal colon is decompressed. Vascular/Lymphatic: Aortic atherosclerosis without aneurysm. Patent portal vein. Patent mesenteric vasculature. No bulky abdominopelvic adenopathy. Reproductive: Uterus is not well-defined on the current exam, but likely quiescent. There is no evidence of adnexal mass. Other: Small volume of mildly complex free fluid in the abdomen and pelvis. No evidence of focal abscess or free air.  Left inguinal hernia repair without evidence of recurrent hernia. There is mild soft tissue edema at the operative bed. No subcutaneous air. Musculoskeletal: Scoliosis with stable anterolisthesis of L4 on L5. No acute findings. IMPRESSION: 1. Interval left inguinal hernia repair without evidence of recurrent hernia. 2. Diffusely dilated and fluid-filled small bowel with mild diffuse small bowel hyperemia. Recent small bowel resection with enteric sutures in the left lower quadrant. The more distal small bowel loops appear decompressed. Findings are suspicious for recurrent small-bowel obstruction, less likely postoperative ileus. 3. Small volume of mildly complex free fluid in the abdomen and pelvis, likely postsurgical. No evidence of focal abscess or free air. 4. Equivocal bladder wall thickening and hyperemia, can be seen with urinary tract infection. 5. Moderate-sized hiatal hernia. Aortic Atherosclerosis (ICD10-I70.0). Electronically Signed   By: Narda Rutherford M.D.   On: 03/29/2020 19:09   DG Abd Portable 1V-Small Bowel Obstruction Protocol-initial, 8 hr delay  Result Date: 03/30/2020 CLINICAL DATA:  Recent emergency abdominal surgery. Follow-up dilated small bowel loops. EXAM: PORTABLE ABDOMEN - 1 VIEW COMPARISON:  03/29/2020 abdominal radiograph FINDINGS: Enteric tube terminates in the proximal stomach. A few mildly dilated small bowel loops in the central and lower abdomen, appearing overall mildly improved since 03/29/2020 CT. Moderate diffuse colonic stool. No evidence of pneumatosis or pneumoperitoneum.  Levocurvature of the thoracolumbar spine. IMPRESSION: Enteric tube terminates in the proximal stomach. Mildly dilated small bowel loops, mildly improved, favoring improving postoperative ileus. Electronically Signed   By: Delbert Phenix M.D.   On: 03/30/2020 10:22    Anti-infectives: Anti-infectives (From admission, onward)   None       Assessment/Plan Depression Chronic pain on chronic  narcotics -Dr. Mikael Spray is her PCP and narcotic prescriber. Home meds (can clamp NGT to give).  UTI - Rocephin. Cx ordered.   SBO vs ileus Hx of repair of strangulated LIH with SBR x1 by Dr. Janee Morn on 8/21 (POD #9) for Incarcerated left inguinal hernia, SBO, necrotic knuckle of small bowel - Ileus vs SBO from early adhesions post op. No evidence of recurrence of hernia on CT or exam.  - NGT, NPO, IVF, SBOP - Patient also with diffuse colonic stool. Will give suppository. May benefit from enemas - Discussed with RN that wall suction device needs to be replaced.  - Keep K > 4 and Mg > 2 for bowel function - Mobilize for bowel function  FEN - NPO, NGT, IVF VTE - SCDs, Lovenox ID - Rocephin for UTI - Cx ordered     LOS: 1 day    Jacinto Halim , [facility] Surgery 03/30/2020, 11:03 AM Please see Amion for pager number during day hours 7:00am-4:30pm

## 2020-03-31 ENCOUNTER — Inpatient Hospital Stay (HOSPITAL_COMMUNITY): Payer: Medicare Other

## 2020-03-31 LAB — URINE CULTURE

## 2020-03-31 LAB — BASIC METABOLIC PANEL
Anion gap: 9 (ref 5–15)
BUN: 17 mg/dL (ref 8–23)
CO2: 26 mmol/L (ref 22–32)
Calcium: 8.3 mg/dL — ABNORMAL LOW (ref 8.9–10.3)
Chloride: 104 mmol/L (ref 98–111)
Creatinine, Ser: 0.72 mg/dL (ref 0.44–1.00)
GFR calc Af Amer: >60 mL/min (ref >60)
GFR calc non Af Amer: >60 mL/min (ref >60)
Glucose, Bld: 151 mg/dL — ABNORMAL HIGH (ref 70–99)
Potassium: 3.5 mmol/L (ref 3.5–5.1)
Sodium: 139 mmol/L (ref 135–145)

## 2020-03-31 LAB — PHOSPHORUS: Phosphorus: 1.8 mg/dL — ABNORMAL LOW (ref 2.5–4.6)

## 2020-03-31 LAB — MAGNESIUM: Magnesium: 2 mg/dL (ref 1.7–2.4)

## 2020-03-31 MED ORDER — OXYCODONE HCL 5 MG PO TABS
15.0000 mg | ORAL_TABLET | Freq: Three times a day (TID) | ORAL | Status: DC | PRN
  Administered 2020-03-31 – 2020-04-01 (×4): 15 mg via ORAL
  Filled 2020-03-31 (×4): qty 3

## 2020-03-31 MED ORDER — POTASSIUM PHOSPHATES 15 MMOLE/5ML IV SOLN
30.0000 mmol | Freq: Once | INTRAVENOUS | Status: AC
  Administered 2020-03-31: 30 mmol via INTRAVENOUS
  Filled 2020-03-31: qty 10

## 2020-03-31 MED ORDER — PANTOPRAZOLE SODIUM 40 MG IV SOLR
40.0000 mg | Freq: Every morning | INTRAVENOUS | Status: DC
  Administered 2020-03-31 – 2020-04-02 (×3): 40 mg via INTRAVENOUS
  Filled 2020-03-31 (×3): qty 40

## 2020-03-31 MED ORDER — OXYCODONE HCL 5 MG PO TABS: 15.0000 mg | ORAL_TABLET | Freq: Four times a day (QID) | ORAL | Status: DC | PRN

## 2020-03-31 MED ORDER — METHOCARBAMOL 1000 MG/10ML IJ SOLN
500.0000 mg | Freq: Three times a day (TID) | INTRAVENOUS | Status: DC | PRN
  Administered 2020-03-31: 500 mg via INTRAVENOUS
  Filled 2020-03-31 (×2): qty 5

## 2020-03-31 MED ORDER — ACETAMINOPHEN 500 MG PO TABS: 1000.0000 mg | ORAL_TABLET | Freq: Four times a day (QID) | ORAL | Status: DC | PRN

## 2020-03-31 NOTE — Progress Notes (Signed)
Subjective: CC: Patient reports nausea has improved. Starting to have more flatus. Liquid like bm yesterday after suppository. NGT is bothering her throat. She notes some lower abdominal cramping that feels like she needs to have a BM  Objective: Vital signs in last 24 hours: Temp:  [97.5 F (36.4 C)-98.2 F (36.8 C)] 97.5 F (36.4 C) (08/31 0412) Pulse Rate:  [73-85] 79 (08/31 0412) Resp:  [16-18] 16 (08/31 0412) BP: (94-114)/(62-77) 104/64 (08/31 0412) SpO2:  [95 %-100 %] 95 % (08/31 0412) Last BM Date: 03/20/20  Intake/Output from previous day: 08/30 0701 - 08/31 0700 In: 0  Out: 600 [Emesis/NG output:600] Intake/Output this shift: No intake/output data recorded.  PE: Gen:  Alert, NAD, pleasant HEENT: NGT in place, clamped. Bilious and sediment in cannister.  Card:  RRR Pulm:  CTA b/l, normal rate and effort  Abd: Soft, lower abdominal distension, mild tenderness across the lower abdomen without peritonitis that is improved from yesterday, +BS, almost hyperactive. Left groin incision c/d/i without signs of infection. No drainage. No recurrence of hernia palpated. Ext:  No LE edema Psych: A&Ox3  Skin: no rashes noted, warm and dry  Lab Results:  Recent Labs    03/29/20 1118  WBC 8.1  HGB 11.8*  HCT 36.4  PLT 325   BMET Recent Labs    03/30/20 0219 03/31/20 0131  NA 137 139  K 3.9 3.5  CL 99 104  CO2 25 26  GLUCOSE 87 151*  BUN 11 17  CREATININE 0.73 0.72  CALCIUM 8.2* 8.3*   PT/INR No results for input(s): LABPROT, INR in the last 72 hours. CMP     Component Value Date/Time   NA 139 03/31/2020 0131   NA 145 05/29/2017 0000   K 3.5 03/31/2020 0131   CL 104 03/31/2020 0131   CO2 26 03/31/2020 0131   GLUCOSE 151 (H) 03/31/2020 0131   BUN 17 03/31/2020 0131   BUN 23 (A) 05/29/2017 0000   CREATININE 0.72 03/31/2020 0131   CALCIUM 8.3 (L) 03/31/2020 0131   PROT 5.6 (L) 03/29/2020 1118   ALBUMIN 3.5 03/29/2020 1118   AST 19 03/29/2020  1118   ALT 20 03/29/2020 1118   ALKPHOS 61 03/29/2020 1118   BILITOT 1.0 03/29/2020 1118   GFRNONAA >60 03/31/2020 0131   GFRAA >60 03/31/2020 0131   Lipase     Component Value Date/Time   LIPASE 20 03/21/2020 0736       Studies/Results: DG Chest 2 View  Result Date: 03/29/2020 CLINICAL DATA:  Dysuria. EXAM: CHEST - 2 VIEW COMPARISON:  March 05, 2017 FINDINGS: Platelike opacity in the right base, likely atelectasis. The heart, hila, mediastinum, lungs, and pleura are otherwise unremarkable. IMPRESSION: Platelike opacity in the right base is favored represent atelectasis, new in the interval. No other acute abnormalities or interval changes. Electronically Signed   By: Gerome Sam III M.D   On: 03/29/2020 11:57   DG Abdomen 1 View  Result Date: 03/29/2020 CLINICAL DATA:  Nasogastric tube placement. EXAM: ABDOMEN - 1 VIEW COMPARISON:  None. FINDINGS: A nasogastric tube is seen with its distal tip overlying the lateral aspect of the left upper quadrant. Multiple dilated small bowel loops are seen throughout the left abdomen. No radio-opaque calculi or other significant radiographic abnormality are seen. Marked severity scoliosis of the thoracolumbar spine is noted. IMPRESSION: Nasogastric tube positioning, as described above, with additional findings consistent with a small bowel obstruction versus ileus. Electronically Signed  By: Aram Candela M.D.   On: 03/29/2020 21:16   CT ABDOMEN PELVIS W CONTRAST  Result Date: 03/29/2020 CLINICAL DATA:  Acute abdominal pain. Return of pain. Recent small bowel obstruction surgery 03/21/2020. Technologist notes state wound looks infected. Dysuria and polyuria. EXAM: CT ABDOMEN AND PELVIS WITH CONTRAST TECHNIQUE: Multidetector CT imaging of the abdomen and pelvis was performed using the standard protocol following bolus administration of intravenous contrast. CONTRAST:  OMNIPAQUE IOHEXOL 300 MG/ML  SOLN COMPARISON:  Preoperative CT  03/21/2020 FINDINGS: Lower chest: Linear right lung base and right middle lobe opacities typical of atelectasis. No pleural fluid. Moderate-sized hiatal hernia. Hepatobiliary: No focal liver abnormality is seen. No gallstones, gallbladder wall thickening, or biliary dilatation. Pancreas: Parenchymal atrophy. No ductal dilatation or inflammation. Spleen: Normal in size without focal abnormality. Adrenals/Urinary Tract: No adrenal nodule. No hydronephrosis or perinephric edema. Homogeneous renal enhancement with symmetric excretion on delayed phase imaging. Tiny subcentimeter cyst in the lower right kidney. Urinary bladder is partially distended. Equivocal bladder wall hyperemia and wall thickening. Stomach/Bowel: Small to moderate hiatal hernia. Interval left inguinal hernia repair without evidence of recurrent hernia. Small bowel diffusely dilated and fluid-filled. There is mild diffuse small bowel hyperemia. Small bowel anastomosis noted in the left lower quadrant. Small bowel distal to the anastomosis appear decompressed or less dilated. There is no small bowel pneumatosis. Moderate volume of stool in the ascending and transverse colon. Distal colon is decompressed. Vascular/Lymphatic: Aortic atherosclerosis without aneurysm. Patent portal vein. Patent mesenteric vasculature. No bulky abdominopelvic adenopathy. Reproductive: Uterus is not well-defined on the current exam, but likely quiescent. There is no evidence of adnexal mass. Other: Small volume of mildly complex free fluid in the abdomen and pelvis. No evidence of focal abscess or free air. Left inguinal hernia repair without evidence of recurrent hernia. There is mild soft tissue edema at the operative bed. No subcutaneous air. Musculoskeletal: Scoliosis with stable anterolisthesis of L4 on L5. No acute findings. IMPRESSION: 1. Interval left inguinal hernia repair without evidence of recurrent hernia. 2. Diffusely dilated and fluid-filled small bowel with  mild diffuse small bowel hyperemia. Recent small bowel resection with enteric sutures in the left lower quadrant. The more distal small bowel loops appear decompressed. Findings are suspicious for recurrent small-bowel obstruction, less likely postoperative ileus. 3. Small volume of mildly complex free fluid in the abdomen and pelvis, likely postsurgical. No evidence of focal abscess or free air. 4. Equivocal bladder wall thickening and hyperemia, can be seen with urinary tract infection. 5. Moderate-sized hiatal hernia. Aortic Atherosclerosis (ICD10-I70.0). Electronically Signed   By: Narda Rutherford M.D.   On: 03/29/2020 19:09   DG Abd Portable 1V  Result Date: 03/31/2020 CLINICAL DATA:  Small-bowel obstruction. Recent bowel resection. History of inguinal hernia repair. EXAM: PORTABLE ABDOMEN - 1 VIEW COMPARISON:  03/30/2020.  CT 03/29/2020. FINDINGS: NG tube in stable position. Persistent dilated loops of small bowel again noted with slight improvement from prior exam. Oral contrast again noted the colon. No free air identified. Lumbar spine scoliosis. IMPRESSION: 1.  NG tube in stable position. 2. Persistent dilated loops of small bowel again noted with slight improvement from prior exam. Oral contrast again noted in the colon. Electronically Signed   By: Maisie Fus  Register   On: 03/31/2020 05:52   DG Abd Portable 1V  Result Date: 03/30/2020 CLINICAL DATA:  8 hour delay small bowel protocol EXAM: PORTABLE ABDOMEN - 1 VIEW COMPARISON:  03/30/2020, CT 03/29/2020 FINDINGS: Esophageal tube tip overlies the stomach.  Persistent dilatation of low abdominal small bowel measuring up to 4.3 cm. Dilute contrast is present within the colon. IMPRESSION: Dilute contrast is present within the colon. Similar appearance of dilated small bowel in the central to lower abdomen Electronically Signed   By: Jasmine Pang M.D.   On: 03/30/2020 20:30   DG Abd Portable 1V-Small Bowel Obstruction Protocol-initial, 8 hr  delay  Result Date: 03/30/2020 CLINICAL DATA:  Recent emergency abdominal surgery. Follow-up dilated small bowel loops. EXAM: PORTABLE ABDOMEN - 1 VIEW COMPARISON:  03/29/2020 abdominal radiograph FINDINGS: Enteric tube terminates in the proximal stomach. A few mildly dilated small bowel loops in the central and lower abdomen, appearing overall mildly improved since 03/29/2020 CT. Moderate diffuse colonic stool. No evidence of pneumatosis or pneumoperitoneum. Levocurvature of the thoracolumbar spine. IMPRESSION: Enteric tube terminates in the proximal stomach. Mildly dilated small bowel loops, mildly improved, favoring improving postoperative ileus. Electronically Signed   By: Delbert Phenix M.D.   On: 03/30/2020 10:22    Anti-infectives: Anti-infectives (From admission, onward)   Start     Dose/Rate Route Frequency Ordered Stop   03/30/20 1145  cefTRIAXone (ROCEPHIN) 1 g in sodium chloride 0.9 % 100 mL IVPB        1 g 200 mL/hr over 30 Minutes Intravenous Every 24 hours 03/30/20 1117         Assessment/Plan Depression Chronic pain on chronic narcotics -Dr. Mikael Spray is her PCP and narcotic prescriber. Home meds UTI - Rocephin. Cx pending   SBO vs ileus Hx of repair of strangulated LIH with SBR x1 by Dr. Janee Morn on 8/21 (POD #10) for Incarcerated left inguinal hernia, SBO, necrotic knuckle of small bowel - Ileus vs SBO from early adhesions/edema at the anastomosis post op. No evidence of recurrence of hernia on CT or exam.  - Contrast in colon on Xray this AM. Pt w/ return of bowel function. Clamping trials and sips of clears from the floor.  - Patient also with diffuse colonic stool. Daily suppository and bowel regimen when taking orals - Keep K > 4 and Mg > 2 for bowel function. Replace K and Phos  - Mobilize for bowel function  FEN - NGT clamped. Sips of clears. IVF VTE - SCDs, Lovenox ID - Rocephin for UTI - Cx pending.     LOS: 2 days    Jacinto Halim , [facility] Surgery 03/31/2020, 9:35 AM Please see Amion for pager number during day hours 7:00am-4:30pm

## 2020-03-31 NOTE — Progress Notes (Signed)
Occupational Therapy Discharge Patient Details Name: Deborah [name] MRN: [id number] DOB: 12/25/1955 Today's Date: 03/31/2020 Time:  -     Patient discharged from OT services secondary to pt states "i dont need occupational therapy. i do everything like everyone else does. I can get up without you" . Pt adamantly declines the need for OT services so OT will sign off. Please reorder with any needs.   Please see latest therapy progress note for current level of functioning and progress toward goals.    Progress and discharge plan discussed with patient and/or caregiver: Patient/Caregiver agrees with plan  GO     [name], OTR/L  Acute Rehabilitation Services Pager: [phone] Office: [phone] .  03/31/2020, 9:35 AM

## 2020-03-31 NOTE — Progress Notes (Signed)
PHARMACIST - PHYSICIAN COMMUNICATION  CONCERNING: P&T Medication Policy Regarding Oral Bisphosphonates  RECOMMENDATION: Your order for alendronate (Fosamax), ibandronate (Boniva), or risedronate (Actonel) has been discontinued at this time.  If the patient's post-hospital medical condition warrants safe use of this class of drugs, please resume the pre-hospital regimen upon discharge.  DESCRIPTION:  Alendronate (Fosamax), ibandronate (Boniva), and risedronate (Actonel) can cause severe esophageal erosions in patients who are unable to remain upright at least 30 minutes after taking this medication.   Since brief interruptions in therapy are thought to have minimal impact on bone mineral density, the Pharmacy & Therapeutics Committee has established that bisphosphonate orders should be routinely discontinued during hospitalization.   To override this safety policy and permit administration of Boniva, Fosamax, or Actonel in the hospital, prescribers must write "DO NOT HOLD" in the comments section when placing the order for this class of medications.   Hakan Nudelman D. Laney Potash, PharmD, BCPS, BCCCP 03/31/2020, 1:48 PM

## 2020-03-31 NOTE — Progress Notes (Signed)
This nurse have noted that pt's NGT output was creamy tan instead of the greenish colored output previously noted, I ask pt if she's eating anything, she said no, NGT flush with air.

## 2020-03-31 NOTE — Progress Notes (Signed)
Pt c/o of abd pain 10/10. Pt offered morphine and stated "You are not giving me enough Morphine!" Pt informed that we are giving pt's medicine per dr's order. Offered ativan for anxiety pt agreed but still stated "you need to call my dr for more morphine right now!" Explained to pt that lets see how the PRN morphine works first and after 30 minutesif its not effective, will call on MD call.

## 2020-03-31 NOTE — Progress Notes (Signed)
Pt requesting vegetable broth. Pt notified that we don't have vegetable broth available on the floor. Pt got upset and stated "Well you need to get me some,!" This RN apologize and offered pt chicken broth or beef broth pt stated that she is vegetarian but to have chicken broth.

## 2020-04-01 LAB — BASIC METABOLIC PANEL
Anion gap: 8 (ref 5–15)
BUN: 13 mg/dL (ref 8–23)
CO2: 26 mmol/L (ref 22–32)
Calcium: 8.2 mg/dL — ABNORMAL LOW (ref 8.9–10.3)
Chloride: 101 mmol/L (ref 98–111)
Creatinine, Ser: 0.83 mg/dL (ref 0.44–1.00)
GFR calc Af Amer: >60 mL/min (ref >60)
GFR calc non Af Amer: >60 mL/min (ref >60)
Glucose, Bld: 105 mg/dL — ABNORMAL HIGH (ref 70–99)
Potassium: 5.6 mmol/L — ABNORMAL HIGH (ref 3.5–5.1)
Sodium: 135 mmol/L (ref 135–145)

## 2020-04-01 LAB — MAGNESIUM: Magnesium: 1.9 mg/dL (ref 1.7–2.4)

## 2020-04-01 LAB — PHOSPHORUS: Phosphorus: 2.3 mg/dL — ABNORMAL LOW (ref 2.5–4.6)

## 2020-04-01 MED ORDER — OXYCODONE HCL 5 MG PO TABS
15.0000 mg | ORAL_TABLET | Freq: Four times a day (QID) | ORAL | Status: DC | PRN
  Administered 2020-04-01 – 2020-04-06 (×10): 15 mg via ORAL
  Filled 2020-04-01 (×11): qty 3

## 2020-04-01 MED ORDER — POLYETHYLENE GLYCOL 3350 17 G PO PACK
17.0000 g | PACK | Freq: Two times a day (BID) | ORAL | Status: DC
  Administered 2020-04-01 – 2020-04-07 (×12): 17 g via ORAL
  Filled 2020-04-01 (×13): qty 1

## 2020-04-01 NOTE — Progress Notes (Signed)
Pt has been leaving the floor today, to go to the vending machine to get snacks and then eating them in the room, when she has been repeatedly told by this RN that she is on a full liquid diet, then c/o that her pain is an 8 to 10 in her abdomen, and then demanding that she get pain mediation.  She was educated several time about her being discharged tomorrow and going home on PO mediation and not IV mediation and Per PA to try and not give IV pain medication. She demanded at 1830 that I give her IV medication and told me that if I do not give her the correct pain medication at discharge, then she will get it on her own.

## 2020-04-01 NOTE — Plan of Care (Signed)

## 2020-04-01 NOTE — Progress Notes (Signed)
Pt called for pain medicine, pain 10/10 oh her abd, throat, and ears. This RN noticed crumbs around pt's mouth and an empty combos package on the bedside table. Asked pt if she's been eating snacks and she said yes. Advised pt that she is NPO and not to eat regular food specially if she's been experiencing so much pain. Pt verbalized understanding. Will continue to monitor pt.

## 2020-04-01 NOTE — Progress Notes (Signed)
Subjective: CC: Patient tolerated clamping trial of NGT yesterday without nausea or emesis. She is passing flatus. BM yesterday that had some formed stool and liquid stool after suppository. Mobilizing. Tolerating cld. Also had popcorn and chips from the vending machine yesterday. Still having some lower abdominal pain/pressure. Notes that she is having pressure in her throat and ears from NGT.   Objective: Vital signs in last 24 hours: Temp:  [97.5 F (36.4 C)-98 F (36.7 C)] 97.5 F (36.4 C) (09/01 0453) Pulse Rate:  [74-75] 74 (09/01 0453) Resp:  [15-16] 16 (09/01 0453) BP: (103-118)/(71-81) 103/71 (09/01 0453) SpO2:  [97 %-98 %] 97 % (09/01 0453) Last BM Date: 03/31/20  Intake/Output from previous day: 08/31 0701 - 09/01 0700 In: 5074.1 [P.O.:840; I.V.:3984.2; IV Piggyback:249.9] Out: -  Intake/Output this shift: No intake/output data recorded.  PE: Gen: Alert, NAD, pleasant HEENT: NGT in place, clamped. Bilious and sediment in cannister.  Card: RRR Pulm: CTA b/l, normal rate and effort Abd: Soft,stable to slightly lower abdominal distension, with mild tenderness across the lower abdomen without peritonitis that is improved from yesterday, +BS. Left groin incision c/d/i without signs of infection. There is some blanchable erythema at the lateral aspect of the wound that is without heat, induration, fluctuance or drainage. This appears more from irritation. No recurrence of hernia palpated. Ext: No LE edema Psych: A&Ox3  Skin: no rashes noted, warm and dry  Lab Results:  Recent Labs    03/29/20 1118  WBC 8.1  HGB 11.8*  HCT 36.4  PLT 325   BMET Recent Labs    03/30/20 0219 03/31/20 0131  NA 137 139  K 3.9 3.5  CL 99 104  CO2 25 26  GLUCOSE 87 151*  BUN 11 17  CREATININE 0.73 0.72  CALCIUM 8.2* 8.3*   PT/INR No results for input(s): LABPROT, INR in the last 72 hours. CMP     Component Value Date/Time   NA 139 03/31/2020 0131   NA 145  05/29/2017 0000   K 3.5 03/31/2020 0131   CL 104 03/31/2020 0131   CO2 26 03/31/2020 0131   GLUCOSE 151 (H) 03/31/2020 0131   BUN 17 03/31/2020 0131   BUN 23 (A) 05/29/2017 0000   CREATININE 0.72 03/31/2020 0131   CALCIUM 8.3 (L) 03/31/2020 0131   PROT 5.6 (L) 03/29/2020 1118   ALBUMIN 3.5 03/29/2020 1118   AST 19 03/29/2020 1118   ALT 20 03/29/2020 1118   ALKPHOS 61 03/29/2020 1118   BILITOT 1.0 03/29/2020 1118   GFRNONAA >60 03/31/2020 0131   GFRAA >60 03/31/2020 0131   Lipase     Component Value Date/Time   LIPASE 20 03/21/2020 0736       Studies/Results: DG Abd Portable 1V  Result Date: 03/31/2020 CLINICAL DATA:  Small-bowel obstruction. Recent bowel resection. History of inguinal hernia repair. EXAM: PORTABLE ABDOMEN - 1 VIEW COMPARISON:  03/30/2020.  CT 03/29/2020. FINDINGS: NG tube in stable position. Persistent dilated loops of small bowel again noted with slight improvement from prior exam. Oral contrast again noted the colon. No free air identified. Lumbar spine scoliosis. IMPRESSION: 1.  NG tube in stable position. 2. Persistent dilated loops of small bowel again noted with slight improvement from prior exam. Oral contrast again noted in the colon. Electronically Signed   By: Maisie Fus  Register   On: 03/31/2020 05:52   DG Abd Portable 1V  Result Date: 03/30/2020 CLINICAL DATA:  8 hour delay small bowel protocol  EXAM: PORTABLE ABDOMEN - 1 VIEW COMPARISON:  03/30/2020, CT 03/29/2020 FINDINGS: Esophageal tube tip overlies the stomach. Persistent dilatation of low abdominal small bowel measuring up to 4.3 cm. Dilute contrast is present within the colon. IMPRESSION: Dilute contrast is present within the colon. Similar appearance of dilated small bowel in the central to lower abdomen Electronically Signed   By: Jasmine Pang M.D.   On: 03/30/2020 20:30   DG Abd Portable 1V-Small Bowel Obstruction Protocol-initial, 8 hr delay  Result Date: 03/30/2020 CLINICAL DATA:  Recent  emergency abdominal surgery. Follow-up dilated small bowel loops. EXAM: PORTABLE ABDOMEN - 1 VIEW COMPARISON:  03/29/2020 abdominal radiograph FINDINGS: Enteric tube terminates in the proximal stomach. A few mildly dilated small bowel loops in the central and lower abdomen, appearing overall mildly improved since 03/29/2020 CT. Moderate diffuse colonic stool. No evidence of pneumatosis or pneumoperitoneum. Levocurvature of the thoracolumbar spine. IMPRESSION: Enteric tube terminates in the proximal stomach. Mildly dilated small bowel loops, mildly improved, favoring improving postoperative ileus. Electronically Signed   By: Delbert Phenix M.D.   On: 03/30/2020 10:22    Anti-infectives: Anti-infectives (From admission, onward)   Start     Dose/Rate Route Frequency Ordered Stop   03/30/20 1145  cefTRIAXone (ROCEPHIN) 1 g in sodium chloride 0.9 % 100 mL IVPB        1 g 200 mL/hr over 30 Minutes Intravenous Every 24 hours 03/30/20 1117         Assessment/Plan Depression Chronic pain on chronic narcotics -Dr. Mikael Spray is her PCP and narcoticprescriber. Home meds UTI - Rocephin. Cx w/ multiple species. Re-ordered.   SBO vs ileus Hx ofrepair of strangulated LIH with SBR x1by Dr. Janee Morn on 8/21 (POD #11)for Incarcerated left inguinal hernia, SBO, necrotic knuckle of small bowel - Ileus vs SBO from early adhesions/edema at the anastomosis post op. No evidence of recurrence of hernia on CT or exam.  - Contrast in colon on Xray yesterday AM. Pt w/ return of bowel function. Tolerated clamping trials, clears from the floor as well as snacks from vending machine. D/c NGT and place on FLD.  - Patient also with diffuse colonic stool. Daily suppository and bowel regimen - Keep K > 4 and Mg > 2 for bowel function. Am labs pending - Mobilize for bowel function  FEN -D/c NGT. FLD. IVF VTE -SCDs, Lovenox ID -Rocephin for UTI - Cx re-ordered as above    LOS: 3 days    Jacinto Halim ,  [facility] 04/01/2020, 8:46 AM Please see Amion for pager number during day hours 7:00am-4:30pm

## 2020-04-02 LAB — BASIC METABOLIC PANEL
Anion gap: 10 (ref 5–15)
BUN: 7 mg/dL — ABNORMAL LOW (ref 8–23)
CO2: 24 mmol/L (ref 22–32)
Calcium: 8.2 mg/dL — ABNORMAL LOW (ref 8.9–10.3)
Chloride: 102 mmol/L (ref 98–111)
Creatinine, Ser: 0.73 mg/dL (ref 0.44–1.00)
GFR calc Af Amer: >60 mL/min (ref >60)
GFR calc non Af Amer: >60 mL/min (ref >60)
Glucose, Bld: 116 mg/dL — ABNORMAL HIGH (ref 70–99)
Potassium: 4.6 mmol/L (ref 3.5–5.1)
Sodium: 136 mmol/L (ref 135–145)

## 2020-04-02 LAB — PHOSPHORUS: Phosphorus: 2.7 mg/dL (ref 2.5–4.6)

## 2020-04-02 LAB — MAGNESIUM: Magnesium: 1.9 mg/dL (ref 1.7–2.4)

## 2020-04-02 MED ORDER — SUCRALFATE 1 GM/10ML PO SUSP
1.0000 g | Freq: Three times a day (TID) | ORAL | Status: DC
  Administered 2020-04-02 – 2020-04-07 (×12): 1 g via ORAL
  Filled 2020-04-02 (×19): qty 10

## 2020-04-02 MED ORDER — PANTOPRAZOLE SODIUM 40 MG PO TBEC
40.0000 mg | DELAYED_RELEASE_TABLET | Freq: Every day | ORAL | Status: DC
  Administered 2020-04-03 – 2020-04-07 (×3): 40 mg via ORAL
  Filled 2020-04-02 (×5): qty 1

## 2020-04-02 MED ORDER — MAGNESIUM CITRATE PO SOLN
0.5000 | Freq: Once | ORAL | Status: AC
  Administered 2020-04-02: 0.5 via ORAL
  Filled 2020-04-02: qty 296

## 2020-04-02 NOTE — Progress Notes (Addendum)
Unwitnessed fall reported by patient at 0045.  Pt notified staff using the call button that she became entrapped in the IV tubing and fell attempting to get out of bed landing on her left hip/trochanter.  Pt reports that she did not hit her head with no signs of injury at the affected area.  Pt was educated on the importance of utilizing the call bell when attempting to get OOB.  When asked why the patient attempted to get OOB, the pt explained it was related to nutritional needs.  Pt received fall armband, fall socks, fall matts and a low bed to reduce fall risk.  MD Tsuei was notified of fall @0050  and Charge Nurse also made aware of clinical situation.  0130: Pt is refusing to get in the low bed observed irate, yelling at staff repeatedly that "I will not get in that bed" while attempting to drag her IV pole around the room while plugged in several times.  Pt was made aware of reasoning for the low bed and continues to refuse.  Pt gathered all her personal belongings and is laying in the visitor's couch in the patient room until original hospital bed is returned.  Pt was explained why the beds could not be switched and continues to refuse getting in the low bed.  Pt also endorses that she did not actually fall and that staff "misheard" what she originally reported.   Primary RN sitting outside room as patient con't to refuse assistance with ambulation and is at increased risk of experiencing a fall.  MD Tsuei notified of clincial situation @0141 . Awaiting response.   0155: Pt's original hospital bed returned to patient's room d/t adamant refusal of low bed.  Pt still laying in the couch in the room.  Will con't to monitor.

## 2020-04-02 NOTE — Progress Notes (Signed)
Subjective: CC: Notes reviewed from overnight. Patient denies fall to myself.  Patient reports abdominal pain in the lower abdomen that feel like pressure and is stable from yesterday. Tolerating fld without n/v. Having some reflux but also passing flatus and had 2-3 BM's yesterday (1 with suppository and 1-2 on her own).   Objective: Vital signs in last 24 hours: Temp:  [97.6 F (36.4 C)-98.2 F (36.8 C)] 97.6 F (36.4 C) (09/02 0057) Pulse Rate:  [75-79] 79 (09/02 0057) Resp:  [16] 16 (09/02 0057) BP: (95-106)/(63-74) 103/74 (09/02 0057) SpO2:  [97 %-100 %] 100 % (09/02 0057) Last BM Date: 03/31/20  Intake/Output from previous day: 09/01 0701 - 09/02 0700 In: 1627.2 [P.O.:540; I.V.:987.2; IV Piggyback:100] Out: -  Intake/Output this shift: No intake/output data recorded.  PE: Gen: Alert, NAD, pleasant  Card: RRR Pulm: CTA b/l, normal rate and effort Abd: Soft,stable to slightly lower abdominal distension, with mild tenderness across the lower abdomen without peritonitisthat is improved from yesterday, +BS. Left groin incision c/d/i withoutsigns of infection. There is some blanchable erythema at the lateral aspect of the wound that is without heat, induration, fluctuance or drainage. This appears more from irritation. No recurrence of hernia palpated. Ext: No LE edema Psych: A&Ox3  Skin: no rashes noted, warm and dry   Lab Results:  No results for input(s): WBC, HGB, HCT, PLT in the last 72 hours. BMET Recent Labs    03/31/20 0131 04/01/20 0410  NA 139 135  K 3.5 5.6*  CL 104 101  CO2 26 26  GLUCOSE 151* 105*  BUN 17 13  CREATININE 0.72 0.83  CALCIUM 8.3* 8.2*   PT/INR No results for input(s): LABPROT, INR in the last 72 hours. CMP     Component Value Date/Time   NA 135 04/01/2020 0410   NA 145 05/29/2017 0000   K 5.6 (H) 04/01/2020 0410   CL 101 04/01/2020 0410   CO2 26 04/01/2020 0410   GLUCOSE 105 (H) 04/01/2020 0410   BUN 13  04/01/2020 0410   BUN 23 (A) 05/29/2017 0000   CREATININE 0.83 04/01/2020 0410   CALCIUM 8.2 (L) 04/01/2020 0410   PROT 5.6 (L) 03/29/2020 1118   ALBUMIN 3.5 03/29/2020 1118   AST 19 03/29/2020 1118   ALT 20 03/29/2020 1118   ALKPHOS 61 03/29/2020 1118   BILITOT 1.0 03/29/2020 1118   GFRNONAA >60 04/01/2020 0410   GFRAA >60 04/01/2020 0410   Lipase     Component Value Date/Time   LIPASE 20 03/21/2020 0736       Studies/Results: No results found.  Anti-infectives: Anti-infectives (From admission, onward)   Start     Dose/Rate Route Frequency Ordered Stop   03/30/20 1145  cefTRIAXone (ROCEPHIN) 1 g in sodium chloride 0.9 % 100 mL IVPB        1 g 200 mL/hr over 30 Minutes Intravenous Every 24 hours 03/30/20 1117         Assessment/Plan Depression Chronic pain on chronic narcotics -Dr. Mikael Spray is her PCP and narcoticprescriber. Home meds UTI - Rocephin. Cxw/ multiple species. Re-ordered.   SBO vs ileus Hx ofrepair of strangulated LIH with SBR x1by Dr. Janee Morn on 8/21 (POD #12)for Incarcerated left inguinal hernia, SBO, necrotic knuckle of small bowel - Ileus vs SBO from early adhesions/edema at the anastomosispost op. No evidence of recurrence of hernia on CT or exam.  -Contrast in colon on Xray 8/31. Pt w/ return of bowel function. Tolerating  FLD with ROBF. Adv to soft diet.   - Patient also with diffuse colonic stool.Dailysuppositoryand bowel regimen - Hyperkalemia noted on yesterdays labs. Repeat labs this AM. D/c LR. - Mobilize for bowel function  FEN -Soft  VTE -SCDs, Lovenox ID -Rocephin for UTI - Cxre-ordered as above    LOS: 4 days    Jacinto Halim , [facility] 04/02/2020, 9:41 AM Please see Amion for pager number during day hours 7:00am-4:30pm

## 2020-04-03 ENCOUNTER — Inpatient Hospital Stay (HOSPITAL_COMMUNITY): Payer: Medicare Other

## 2020-04-03 LAB — CBC
HCT: 41.4 % (ref 36.0–46.0)
Hemoglobin: 13.7 g/dL (ref 12.0–15.0)
MCH: 30.7 pg (ref 26.0–34.0)
MCHC: 33.1 g/dL (ref 30.0–36.0)
MCV: 92.8 fL (ref 80.0–100.0)
Platelets: 420 10*3/uL — ABNORMAL HIGH (ref 150–400)
RBC: 4.46 MIL/uL (ref 3.87–5.11)
RDW: 13.3 % (ref 11.5–15.5)
WBC: 12.4 10*3/uL — ABNORMAL HIGH (ref 4.0–10.5)
nRBC: 0 % (ref 0.0–0.2)

## 2020-04-03 LAB — BASIC METABOLIC PANEL
Anion gap: 11 (ref 5–15)
BUN: 5 mg/dL — ABNORMAL LOW (ref 8–23)
CO2: 32 mmol/L (ref 22–32)
Calcium: 8.9 mg/dL (ref 8.9–10.3)
Chloride: 95 mmol/L — ABNORMAL LOW (ref 98–111)
Creatinine, Ser: 0.69 mg/dL (ref 0.44–1.00)
GFR calc Af Amer: >60 mL/min (ref >60)
GFR calc non Af Amer: >60 mL/min (ref >60)
Glucose, Bld: 117 mg/dL — ABNORMAL HIGH (ref 70–99)
Potassium: 3.8 mmol/L (ref 3.5–5.1)
Sodium: 138 mmol/L (ref 135–145)

## 2020-04-03 LAB — MAGNESIUM: Magnesium: 2.1 mg/dL (ref 1.7–2.4)

## 2020-04-03 LAB — PHOSPHORUS: Phosphorus: 3.9 mg/dL (ref 2.5–4.6)

## 2020-04-03 MED ORDER — PROMETHAZINE HCL 25 MG/ML IJ SOLN
12.5000 mg | Freq: Four times a day (QID) | INTRAMUSCULAR | Status: DC | PRN
  Administered 2020-04-03 – 2020-04-06 (×5): 12.5 mg via INTRAVENOUS
  Filled 2020-04-03 (×7): qty 1

## 2020-04-03 MED ORDER — KCL IN DEXTROSE-NACL 20-5-0.45 MEQ/L-%-% IV SOLN
INTRAVENOUS | Status: DC
  Filled 2020-04-03 (×4): qty 1000

## 2020-04-03 MED ORDER — LORAZEPAM 2 MG/ML IJ SOLN
1.0000 mg | INTRAMUSCULAR | Status: DC | PRN
  Administered 2020-04-03 – 2020-04-07 (×7): 1 mg via INTRAVENOUS
  Filled 2020-04-03 (×7): qty 1

## 2020-04-03 NOTE — Progress Notes (Signed)
Pt had 3 emesis episodes throughout day. First 2 reported to [facility] and last to Wilson MD. Each time, emesis was brown and coffee ground like. The last episode was a large amount, slightly more formed, and appeared to be stool (despite just administering phenergan to pt for her nausea). Labs and fluids ordered for pt along w/ follow scan. She will be NPO at this time per Andrey Campanile MD. RN will continue to monitor pt.

## 2020-04-03 NOTE — Progress Notes (Signed)
[facility] Progress Note     Subjective: CC-  Patient is in tears this morning. She will not answer many of my questions. States that she is nauseated, vomited this morning, and hurts all over. BM yesterday. No flatus today. She is upset about her pain medication. Per nurse the patient has been emotionally labile. She ambulated to call room on the floor yesterday to sleep. She was assisted to the bathroom this morning and just wanted to lie on the bathroom floor. She did not fall.   Objective: Vital signs in last 24 hours: Temp:  [97.5 F (36.4 C)-98.6 F (37 C)] 98.6 F (37 C) (09/03 0935) Pulse Rate:  [77-93] 86 (09/03 0935) Resp:  [16-18] 18 (09/03 0935) BP: (100-150)/(69-80) 150/80 (09/03 0935) SpO2:  [93 %-96 %] 93 % (09/03 0935) Last BM Date: 03/31/20  Intake/Output from previous day: 09/02 0701 - 09/03 0700 In: 540 [P.O.:540] Out: -  Intake/Output this shift: No intake/output data recorded.  PE: Gen:  Alert, tearful/moaning HEENT: EOM's intact, pupils equal and round, PERRL Card:  RRR Pulm:  CTAB, no W/R/R, rate and effort normal Abd: Soft, mild distension, mild diffuse TTP without peritonitis, +BS, left groin incision cdi without erythema or drainage Ext:  no BUE/BLE edema, calves soft and nontender Psych: A&Ox3 Neuro: no gross motor or sensory deficits Skin: no rashes noted, warm and dry  Lab Results:  No results for input(s): WBC, HGB, HCT, PLT in the last 72 hours. BMET Recent Labs    04/01/20 0410 04/02/20 0942  NA 135 136  K 5.6* 4.6  CL 101 102  CO2 26 24  GLUCOSE 105* 116*  BUN 13 7*  CREATININE 0.83 0.73  CALCIUM 8.2* 8.2*   PT/INR No results for input(s): LABPROT, INR in the last 72 hours. CMP     Component Value Date/Time   NA 136 04/02/2020 0942   NA 145 05/29/2017 0000   K 4.6 04/02/2020 0942   CL 102 04/02/2020 0942   CO2 24 04/02/2020 0942   GLUCOSE 116 (H) 04/02/2020 0942   BUN 7 (L) 04/02/2020 0942   BUN 23 (A)  05/29/2017 0000   CREATININE 0.73 04/02/2020 0942   CALCIUM 8.2 (L) 04/02/2020 0942   PROT 5.6 (L) 03/29/2020 1118   ALBUMIN 3.5 03/29/2020 1118   AST 19 03/29/2020 1118   ALT 20 03/29/2020 1118   ALKPHOS 61 03/29/2020 1118   BILITOT 1.0 03/29/2020 1118   GFRNONAA >60 04/02/2020 0942   GFRAA >60 04/02/2020 0942   Lipase     Component Value Date/Time   LIPASE 20 03/21/2020 0736       Studies/Results: No results found.  Anti-infectives: Anti-infectives (From admission, onward)   Start     Dose/Rate Route Frequency Ordered Stop   03/30/20 1145  cefTRIAXone (ROCEPHIN) 1 g in sodium chloride 0.9 % 100 mL IVPB        1 g 200 mL/hr over 30 Minutes Intravenous Every 24 hours 03/30/20 1117         Assessment/Plan Depression Chronic pain on chronic narcotics -Dr. Mikael Spray is her PCP and narcoticprescriber. Home meds UTI - Rocephin. Cxw/ multiple species. Re-ordered.  SBO vs ileus Hx ofrepair of strangulated LIH with SBR x1by Dr. Janee Morn on 8/21 (POD #13)for Incarcerated left inguinal hernia, SBO, necrotic knuckle of small bowel - Ileus vs SBO from early adhesions/edema at the anastomosispost op. No evidence of recurrence of hernia on CT 8/29 or exam.  - Patient also with  diffuse colonic stool.Dailysuppositoryand bowel regimen -Contrast in colon on Xray8/31. Patient was improving and advancing diet  FEN -Soft  VTE -SCDs, Lovenox ID -Rocephin for UTI - Cxre-ordered  Plan: Patient was clinically improving but this morning she is nauseated, vomited, and states she is no longer passing flatus. Check abdominal film and lab work.   LOS: 5 days    Franne Forts, [facility] 04/03/2020, 10:57 AM Please see Amion for pager number during day hours 7:00am-4:30pm

## 2020-04-03 NOTE — Progress Notes (Signed)
RN placed yellow socks. RN turned on bed alarm and lowered bed. RN tried to stand by pt when she voided. Pt refused to have pt on stand by and closed door completely. Pt refused to use call bell to notify RN/NT of need to get out of bed. Pt refusing all fall precautions - even bed alarm. Meuth PA notified along with Stasia Cavalier (charge RN). RN will continue to assess pt.

## 2020-04-03 NOTE — Progress Notes (Signed)
Physical Therapy Treatment Patient Details Name: Deborah [name] MRN: [id number] DOB: 07-15-1956 Today's Date: 04/03/2020    History of Present Illness 3F POD8 from repair of strangulated LIH with SBR x1 by Dr. Janee Morn. Discharged on POD3. Has not had a bowel movement since surgery, but has a history of chronic pain, chrnoic nausea, and chronic constipation with a report of weekly bowel movements. Reports increased nausea and vomiting that began on 8/25 or 8/26, she is unsure which day. She reports being prescribed pepcid, which did not help. Has not taken any OTC meds for her symptoms. Last meal was pre-op, has only been eating small snacks since surgery. Found to have SBO.    PT Comments    Patient not progressing towards PT goals today due to not feeling well and pain. Pt found laying on floor in bathroom "because it was cooler down there." Requires total A of 2 to stand from floor and Min A to ambulate due to shuffling like gait pattern and imbalance. Pt moaning in pain and restless. RN present and aware. VSS. Also reports nausea. Further mobility limited due to pain. Will continue to follow. If pt does not progress or improve, may require HHPT.    Follow Up Recommendations  No PT follow up (pending progress)     Equipment Recommendations  None recommended by PT    Recommendations for Other Services       Precautions / Restrictions Precautions Precautions: Fall Precaution Comments: severe rightward scoliosis, convexity and rib hump on L and rotation towards R Restrictions Weight Bearing Restrictions: No    Mobility  Bed Mobility Overal bed mobility: Needs Assistance Bed Mobility: Sit to Supine       Sit to supine: Min guard   General bed mobility comments: Pt falling onto bed and rolling onto side without assist.  Transfers Overall transfer level: Needs assistance Equipment used: 2 person hand held assist Transfers: Sit to/from Stand Sit to Stand: Total assist          General transfer comment: pt found laying on floor in bathroom (wanted to sleep on floor as it was cooler); total A to elevate trunk and assist pt into standing using gait belt.  Ambulation/Gait Ambulation/Gait assistance: Min assist Gait Distance (Feet): 15 Feet Assistive device: 1 person hand held assist Gait Pattern/deviations: Step-through pattern;Trunk flexed;Shuffle Gait velocity: decreased   General Gait Details: Slow, unsteady gait with flexed trunk and pt reaching for UE support on bed rail; Min A for balance/safety.   Stairs             Wheelchair Mobility    Modified Rankin (Stroke Patients Only)       Balance           Standing balance support: During functional activity Standing balance-Leahy Scale: Poor Standing balance comment: requires Ue support and Min A for balance.                            Cognition Arousal/Alertness: Awake/alert Behavior During Therapy: Restless Overall Cognitive Status: No family/caregiver present to determine baseline cognitive functioning                                 General Comments: Pt not really talkative today; groggy. reports not feeling well. Emotional and crying during session due to not feeling well and pain everywhere. Distracted by pain.  Exercises      General Comments General comments (skin integrity, edema, etc.): Nurse called into room and was told how pt was found. VSS, BP 150/80, Sp02 92% on RA.      Pertinent Vitals/Pain Pain Assessment: Faces Faces Pain Scale: Hurts worst Pain Location: "everywhere", right back/side Pain Descriptors / Indicators: Crying;Discomfort;Restless;Moaning Pain Intervention(s): Monitored during session;Repositioned;Limited activity within patient's tolerance;Patient requesting pain meds-RN notified    Home Living                      Prior Function            PT Goals (current goals can now be found in the care  plan section) Progress towards PT goals: Not progressing toward goals - comment (due to pain)    Frequency    Min 2X/week      PT Plan Current plan remains appropriate    Co-evaluation              AM-PAC PT "6 Clicks" Mobility   Outcome Measure  Help needed turning from your back to your side while in a flat bed without using bedrails?: None Help needed moving from lying on your back to sitting on the side of a flat bed without using bedrails?: A Little Help needed moving to and from a bed to a chair (including a wheelchair)?: A Little Help needed standing up from a chair using your arms (e.g., wheelchair or bedside chair)?: A Lot Help needed to walk in hospital room?: A Little Help needed climbing 3-5 steps with a railing? : A Little 6 Click Score: 18    End of Session Equipment Utilized During Treatment: Gait belt Activity Tolerance: Patient limited by pain Patient left: in bed;with call bell/phone within reach;with bed alarm set;with family/visitor present Nurse Communication: Mobility status;Other (comment) (laying on floor) PT Visit Diagnosis: Muscle weakness (generalized) (M62.81)     Time: 0233-4356 PT Time Calculation (min) (ACUTE ONLY): 16 min  Charges:  $Therapeutic Activity: 8-22 mins                     Deborah [name], PT, DPT Acute Rehabilitation Services Pager [phone] Office [phone]       Deborah [name] 04/03/2020, 1:29 PM

## 2020-04-04 ENCOUNTER — Inpatient Hospital Stay (HOSPITAL_COMMUNITY): Payer: Medicare Other

## 2020-04-04 LAB — BASIC METABOLIC PANEL
Anion gap: 9 (ref 5–15)
BUN: 7 mg/dL — ABNORMAL LOW (ref 8–23)
CO2: 31 mmol/L (ref 22–32)
Calcium: 8.4 mg/dL — ABNORMAL LOW (ref 8.9–10.3)
Chloride: 96 mmol/L — ABNORMAL LOW (ref 98–111)
Creatinine, Ser: 0.76 mg/dL (ref 0.44–1.00)
GFR calc Af Amer: >60 mL/min (ref >60)
GFR calc non Af Amer: >60 mL/min (ref >60)
Glucose, Bld: 106 mg/dL — ABNORMAL HIGH (ref 70–99)
Potassium: 3.9 mmol/L (ref 3.5–5.1)
Sodium: 136 mmol/L (ref 135–145)

## 2020-04-04 LAB — MAGNESIUM: Magnesium: 2.2 mg/dL (ref 1.7–2.4)

## 2020-04-04 MED ORDER — SACCHAROMYCES BOULARDII 250 MG PO CAPS
250.0000 mg | ORAL_CAPSULE | Freq: Two times a day (BID) | ORAL | Status: DC
  Administered 2020-04-04 – 2020-04-07 (×5): 250 mg via ORAL
  Filled 2020-04-04 (×7): qty 1

## 2020-04-04 NOTE — Progress Notes (Addendum)
[facility] Progress Note     Subjective: CC-   She says that she is doing better with nausea.  She wants to go back to reg food.   She is a very difficult personality with her pain meds needs/addiction  I spoke to her sister, Devetta Hagenow, on the phone  Objective: Vital signs in last 24 hours: Temp:  [97.9 F (36.6 C)-98.5 F (36.9 C)] 98.3 F (36.8 C) (09/04 0557) Pulse Rate:  [87-100] 90 (09/04 0557) Resp:  [14-20] 14 (09/04 0557) BP: (104-145)/(71-93) 125/88 (09/04 0557) SpO2:  [92 %-98 %] 98 % (09/04 0557) Last BM Date: 04/03/20  Intake/Output from previous day: 09/03 0701 - 09/04 0700 In: 492.4 [P.O.:75; I.V.:417.4] Out: 50 [Urine:50] Intake/Output this shift: No intake/output data recorded.  PE: Gen:  Alert HEENT: EOM's intact, pupils equal and round, PERRL Card:  RRR Pulm:  CTAB, no W/R/R, rate and effort normal Abd: Soft, mild distension, mild diffuse TTP without peritonitis, +BS, left groin incision cdi without erythema or drainage Ext:  She has a bruise on her left hip  Lab Results:  Recent Labs    04/03/20 1654  WBC 12.4*  HGB 13.7  HCT 41.4  PLT 420*   BMET Recent Labs    04/03/20 1654 04/04/20 0312  NA 138 136  K 3.8 3.9  CL 95* 96*  CO2 32 31  GLUCOSE 117* 106*  BUN 5* 7*  CREATININE 0.69 0.76  CALCIUM 8.9 8.4*   PT/INR No results for input(s): LABPROT, INR in the last 72 hours. CMP     Component Value Date/Time   NA 136 04/04/2020 0312   NA 145 05/29/2017 0000   K 3.9 04/04/2020 0312   CL 96 (L) 04/04/2020 0312   CO2 31 04/04/2020 0312   GLUCOSE 106 (H) 04/04/2020 0312   BUN 7 (L) 04/04/2020 0312   BUN 23 (A) 05/29/2017 0000   CREATININE 0.76 04/04/2020 0312   CALCIUM 8.4 (L) 04/04/2020 0312   PROT 5.6 (L) 03/29/2020 1118   ALBUMIN 3.5 03/29/2020 1118   AST 19 03/29/2020 1118   ALT 20 03/29/2020 1118   ALKPHOS 61 03/29/2020 1118   BILITOT 1.0 03/29/2020 1118   GFRNONAA >60 04/04/2020 0312   GFRAA >60  04/04/2020 0312   Lipase     Component Value Date/Time   LIPASE 20 03/21/2020 0736       Studies/Results: DG Abd Portable 1V  Result Date: 04/03/2020 CLINICAL DATA:  Nausea.  Recent abdominal surgery. EXAM: PORTABLE ABDOMEN - 1 VIEW COMPARISON:  03/31/2020 and prior studies FINDINGS: No dilated bowel loops are noted. Contrast within the colon is again identified. No acute abnormalities are identified. IMPRESSION: No evidence of acute abnormality. Electronically Signed   By: Harmon Pier M.D.   On: 04/03/2020 12:31    Anti-infectives: Anti-infectives (From admission, onward)   Start     Dose/Rate Route Frequency Ordered Stop   03/30/20 1145  cefTRIAXone (ROCEPHIN) 1 g in sodium chloride 0.9 % 100 mL IVPB        1 g 200 mL/hr over 30 Minutes Intravenous Every 24 hours 03/30/20 1117 04/04/20 2359     Assessment/Plan Depression Chronic pain on chronic narcotics -Dr. Mikael Spray is her PCP and narcoticprescriber. Home meds UTI - Rocephin. Cxw/ multiple species.   I don't see where this has been repeated again  SBO vs ileus Hx ofrepair of strangulated LIH with SBR x1by Dr. Janee Morn on 8/21 for Incarcerated left inguinal hernia, SBO, necrotic  knuckle of small bowel  Discharged on 8/25 - then readmitted on 8/29  - Ileus vs SBO from early adhesions/edema at the anastomosispost op. No evidence of recurrence of hernia on CT 8/29 or exam.   - Patient also with diffuse colonic stool.Dailysuppositoryand bowel regimen  -Contrast in colon on Xray8/31. Patient was improving and advancing diet  FEN -Soft  VTE -SCDs, Lovenox ID -UTI  - [place] suggests UTI, but <10,000 colonies   Repeat culture on 8/30 - multiple species (I don't see where this has been repeated again)  Plan: To restart a regular diet, needs to ambulate   LOS: 6 days   Kandis Cocking, [facility] 04/04/2020, 10:50 AM

## 2020-04-04 NOTE — Progress Notes (Signed)
Patient reporting new onset pain to sacral area and L thigh, posterior - thinks was injured yesterday. Small bruise on left side of tailbone, large purple bruise on back of L thigh. Ice given.

## 2020-04-05 MED ORDER — METOCLOPRAMIDE HCL 5 MG/ML IJ SOLN
5.0000 mg | Freq: Four times a day (QID) | INTRAMUSCULAR | Status: DC | PRN
  Administered 2020-04-06: 5 mg via INTRAVENOUS
  Filled 2020-04-05: qty 2

## 2020-04-05 MED ORDER — LINACLOTIDE 145 MCG PO CAPS
145.0000 ug | ORAL_CAPSULE | Freq: Every day | ORAL | Status: DC
  Administered 2020-04-07: 145 ug via ORAL
  Filled 2020-04-05 (×3): qty 1

## 2020-04-05 NOTE — Progress Notes (Addendum)
[facility] Progress Note     Subjective: CC-   Does not feel as good as yesterday. Nauseated and vomited last PM.  She feels bloated, but no localized abdominal pain.  Her speech is slurred - not sure if this is from medication.  Objective: Vital signs in last 24 hours: Temp:  [98.3 F (36.8 C)-98.6 F (37 C)] 98.4 F (36.9 C) (09/05 0416) Pulse Rate:  [91-93] 93 (09/05 0416) Resp:  [16-18] 18 (09/05 0416) BP: (100-112)/(68-91) 110/86 (09/05 0416) SpO2:  [97 %-98 %] 98 % (09/05 0416) Last BM Date: 04/03/20  Intake/Output from previous day: 09/04 0701 - 09/05 0700 In: 300 [P.O.:300] Out: 400 [Urine:400] Intake/Output this shift: No intake/output data recorded.  PE: Gen:  More slurred speech. HEENT: EOM's intact, pupils equal and round, PERRL Card:  RRR Pulm:  Symmetric breath sounds Abd: Soft, mild distension, she has very few BS  Left groin incision cdi without erythema or drainage Ext:  She has a bruise on her left hip  Lab Results:  Recent Labs    04/03/20 1654  WBC 12.4*  HGB 13.7  HCT 41.4  PLT 420*   BMET Recent Labs    04/03/20 1654 04/04/20 0312  NA 138 136  K 3.8 3.9  CL 95* 96*  CO2 32 31  GLUCOSE 117* 106*  BUN 5* 7*  CREATININE 0.69 0.76  CALCIUM 8.9 8.4*   PT/INR No results for input(s): LABPROT, INR in the last 72 hours. CMP     Component Value Date/Time   NA 136 04/04/2020 0312   NA 145 05/29/2017 0000   K 3.9 04/04/2020 0312   CL 96 (L) 04/04/2020 0312   CO2 31 04/04/2020 0312   GLUCOSE 106 (H) 04/04/2020 0312   BUN 7 (L) 04/04/2020 0312   BUN 23 (A) 05/29/2017 0000   CREATININE 0.76 04/04/2020 0312   CALCIUM 8.4 (L) 04/04/2020 0312   PROT 5.6 (L) 03/29/2020 1118   ALBUMIN 3.5 03/29/2020 1118   AST 19 03/29/2020 1118   ALT 20 03/29/2020 1118   ALKPHOS 61 03/29/2020 1118   BILITOT 1.0 03/29/2020 1118   GFRNONAA >60 04/04/2020 0312   GFRAA >60 04/04/2020 0312   Lipase     Component Value Date/Time    LIPASE 20 03/21/2020 0736       Studies/Results: DG Abd 1 View  Result Date: 04/04/2020 CLINICAL DATA:  64 year old female with a history of incarcerated hernia and small-bowel obstruction presenting with nausea and vomiting. EXAM: ABDOMEN - 1 VIEW COMPARISON:  Abdominal radiographs dated yesterday FINDINGS: Advanced levoconvex and rotary scoliosis of the lumbar spine centered at L2 again noted. No evidence of free air. Slight interval distal progression of oral contrast material into the descending and sigmoid colon. No evidence of bowel obstruction. Moderate right layering pleural effusion with associated right basilar atelectasis. IMPRESSION: 1. Nonobstructed bowel gas pattern. 2. Continue distal progression of previously ingested oral contrast material which now opacifies the descending and sigmoid colon. 3. Moderate layering right pleural effusion and associated right basilar atelectasis. Electronically Signed   By: Malachy Moan M.D.   On: 04/04/2020 11:35   DG Abd Portable 1V  Result Date: 04/03/2020 CLINICAL DATA:  Nausea.  Recent abdominal surgery. EXAM: PORTABLE ABDOMEN - 1 VIEW COMPARISON:  03/31/2020 and prior studies FINDINGS: No dilated bowel loops are noted. Contrast within the colon is again identified. No acute abnormalities are identified. IMPRESSION: No evidence of acute abnormality. Electronically Signed   By: Tinnie Gens  Hu M.D.   On: 04/03/2020 12:31    Anti-infectives: Anti-infectives (From admission, onward)   Start     Dose/Rate Route Frequency Ordered Stop   03/30/20 1145  cefTRIAXone (ROCEPHIN) 1 g in sodium chloride 0.9 % 100 mL IVPB        1 g 200 mL/hr over 30 Minutes Intravenous Every 24 hours 03/30/20 1117 04/04/20 1427     Assessment/Plan Depression Chronic pain on chronic narcotics -  Dr. Mikael Spray is her PCP and narcoticprescriber. Home meds UTI - Rocephin. Cxw/ multiple species.   I don't see where this has been repeated again Bruise - left  thigh/hip  SBO vs ileus Hx ofrepair of strangulated LIH with SBR x1by Dr. Janee Morn on 8/21 for Incarcerated left inguinal hernia, SBO, necrotic knuckle of small bowel  Discharged on 8/25 - then readmitted on 8/29  - Ileus vs SBO from early adhesions/edema at the anastomosispost op. No evidence of recurrence of hernia on CT 8/29 or exam.   - Patient also with diffuse colonic stool.Dailysuppositoryand bowel regimen  - KUB - 04/04/2020 - progression of contrast in colon  FEN -Soft  VTE -SCDs, Lovenox ID -UTI  - [place] suggests UTI, but <10,000 colonies   Repeat culture on 8/30 - multiple species (I don't see where this has been repeated again)  Plan:  Back down to clear liquids  Encourage ambulation  Repeat labs and KUB in AM   LOS: 7 days   Kandis Cocking, [facility] 04/05/2020, 9:15 AM

## 2020-04-05 NOTE — Progress Notes (Signed)
patient has vomited twice last night, she said meds not working ,attending paged , day shift nurse notified, will continue to monitor.

## 2020-04-06 ENCOUNTER — Inpatient Hospital Stay (HOSPITAL_COMMUNITY): Payer: Medicare Other

## 2020-04-06 LAB — BASIC METABOLIC PANEL
Anion gap: 9 (ref 5–15)
BUN: 12 mg/dL (ref 8–23)
CO2: 28 mmol/L (ref 22–32)
Calcium: 8.4 mg/dL — ABNORMAL LOW (ref 8.9–10.3)
Chloride: 99 mmol/L (ref 98–111)
Creatinine, Ser: 0.73 mg/dL (ref 0.44–1.00)
GFR calc Af Amer: >60 mL/min (ref >60)
GFR calc non Af Amer: >60 mL/min (ref >60)
Glucose, Bld: 122 mg/dL — ABNORMAL HIGH (ref 70–99)
Potassium: 4.2 mmol/L (ref 3.5–5.1)
Sodium: 136 mmol/L (ref 135–145)

## 2020-04-06 NOTE — Progress Notes (Signed)
Paged and spoke with Dr Fredricka Bonine about patient refusing all meds and IV fluids or ambulation.  States no new orders at this time.

## 2020-04-06 NOTE — Progress Notes (Signed)
[facility] Progress Note     Subjective:  Doing a little better today.  Not as somnolent.  She has the same complaints - back pain and abdominal discomfort.  She did walk around the hall yesterday.  Objective: Vital signs in last 24 hours: Temp:  [97.9 F (36.6 C)-98.4 F (36.9 C)] 98.1 F (36.7 C) (09/06 0401) Pulse Rate:  [92-93] 93 (09/06 0401) Resp:  [17-18] 17 (09/06 0401) BP: (143-153)/(94-100) 143/94 (09/06 0401) SpO2:  [96 %-100 %] 97 % (09/06 0401) Last BM Date: 04/03/20  Intake/Output from previous day: No intake/output data recorded. Intake/Output this shift: No intake/output data recorded.  PE: Gen:  More alert today.  In less discomfort. HEENT: EOM's intact, pupils equal and round, PERRL Card:  RRR Pulm:  Symmetric breath sounds Abd: Soft, mild distension, more active BS  Left groin incision cdi without erythema or drainage Ext:  She has ecchymosis on her left hip  Lab Results:  Recent Labs    04/03/20 1654  WBC 12.4*  HGB 13.7  HCT 41.4  PLT 420*   BMET Recent Labs    04/04/20 0312 04/06/20 0113  NA 136 136  K 3.9 4.2  CL 96* 99  CO2 31 28  GLUCOSE 106* 122*  BUN 7* 12  CREATININE 0.76 0.73  CALCIUM 8.4* 8.4*   PT/INR No results for input(s): LABPROT, INR in the last 72 hours. CMP     Component Value Date/Time   NA 136 04/06/2020 0113   NA 145 05/29/2017 0000   K 4.2 04/06/2020 0113   CL 99 04/06/2020 0113   CO2 28 04/06/2020 0113   GLUCOSE 122 (H) 04/06/2020 0113   BUN 12 04/06/2020 0113   BUN 23 (A) 05/29/2017 0000   CREATININE 0.73 04/06/2020 0113   CALCIUM 8.4 (L) 04/06/2020 0113   PROT 5.6 (L) 03/29/2020 1118   ALBUMIN 3.5 03/29/2020 1118   AST 19 03/29/2020 1118   ALT 20 03/29/2020 1118   ALKPHOS 61 03/29/2020 1118   BILITOT 1.0 03/29/2020 1118   GFRNONAA >60 04/06/2020 0113   GFRAA >60 04/06/2020 0113   Lipase     Component Value Date/Time   LIPASE 20 03/21/2020 0736       Studies/Results: DG  Abd 1 View  Result Date: 04/04/2020 CLINICAL DATA:  64 year old female with a history of incarcerated hernia and small-bowel obstruction presenting with nausea and vomiting. EXAM: ABDOMEN - 1 VIEW COMPARISON:  Abdominal radiographs dated yesterday FINDINGS: Advanced levoconvex and rotary scoliosis of the lumbar spine centered at L2 again noted. No evidence of free air. Slight interval distal progression of oral contrast material into the descending and sigmoid colon. No evidence of bowel obstruction. Moderate right layering pleural effusion with associated right basilar atelectasis. IMPRESSION: 1. Nonobstructed bowel gas pattern. 2. Continue distal progression of previously ingested oral contrast material which now opacifies the descending and sigmoid colon. 3. Moderate layering right pleural effusion and associated right basilar atelectasis. Electronically Signed   By: Malachy Moan M.D.   On: 04/04/2020 11:35    Anti-infectives: Anti-infectives (From admission, onward)   Start     Dose/Rate Route Frequency Ordered Stop   03/30/20 1145  cefTRIAXone (ROCEPHIN) 1 g in sodium chloride 0.9 % 100 mL IVPB        1 g 200 mL/hr over 30 Minutes Intravenous Every 24 hours 03/30/20 1117 04/04/20 1427     Assessment/Plan Depression Chronic pain on chronic narcotics -  Dr. Mikael Spray is her  PCP and narcoticprescriber. Home meds UTI - Rocephin. Cxw/ multiple species.   I don't see where this has been repeated again Bruise - left thigh/hip  SBO vs ileus Hx ofrepair of strangulated LIH with SBR x1by Dr. Janee Morn on 8/21 for Incarcerated left inguinal hernia, SBO, necrotic knuckle of small bowel  Discharged on 8/25 - then readmitted on 8/29  - Ileus vs SBO from early adhesions/edema at the anastomosispost op. No evidence of recurrence of hernia on CT 8/29 or exam.   - Patient also with diffuse colonic stool.Dailysuppositoryand bowel regimen  - KUB - 04/04/2020 - progression of contrast in  colon  Today's KUB pending  FEN -Soft  VTE -SCDs, Lovenox ID -UTI  - [place] suggests UTI, but <10,000 colonies   Repeat culture on 8/30 - multiple species (I don't see where this has been repeated again)  Plan:  On clear liquids  Encourage ambulation     LOS: 8 days   Kandis Cocking, [facility] 04/06/2020, 9:03 AM

## 2020-04-06 NOTE — Progress Notes (Signed)
Patient has refused all medication to be given due to patient upset that pain medication is not ordered as a scheduled dose of oxycodone and morphine to be given at the same time. Paged on call doctor and returned or wasted all meds.  Refuses to ambulate or to sit up in chair for meds and foods. Educated patient of risks and complications of not ambulating and not taking scheduled meds as well as risk of taking IV pain meds and oral meds at the same time. Still refused any care at this time.

## 2020-04-07 MED ORDER — POLYETHYLENE GLYCOL 3350 17 G PO PACK: 17.0000 g | PACK | Freq: Two times a day (BID) | ORAL | Status: DC

## 2020-04-07 MED ORDER — SUCRALFATE 1 GM/10ML PO SUSP: 1.0000 g | Freq: Three times a day (TID) | ORAL | 0 refills | Status: DC

## 2020-04-07 MED ORDER — BISACODYL 10 MG RE SUPP: 10.0000 mg | Freq: Two times a day (BID) | RECTAL | Status: DC | PRN

## 2020-04-07 MED ORDER — SACCHAROMYCES BOULARDII 250 MG PO CAPS: 250.0000 mg | ORAL_CAPSULE | Freq: Two times a day (BID) | ORAL | 0 refills | Status: DC

## 2020-04-07 MED ORDER — LINACLOTIDE 145 MCG PO CAPS
145.0000 ug | ORAL_CAPSULE | Freq: Every day | ORAL | 0 refills | Status: DC
Start: 2020-04-08 — End: 2021-12-15

## 2020-04-07 MED ORDER — SORBITOL 70 % SOLN
960.0000 mL | TOPICAL_OIL | Freq: Once | ORAL | Status: AC
  Administered 2020-04-07: 960 mL via RECTAL
  Filled 2020-04-07: qty 473

## 2020-04-07 NOTE — Progress Notes (Signed)
Physical Therapy Treatment Patient Details Name: Deborah [name] MRN: [id number] DOB: 10-01-1955 Today's Date: 04/07/2020    History of Present Illness 61F POD8 from repair of strangulated LIH with SBR x1 by Dr. Janee Morn. Discharged on POD3. Has not had a bowel movement since surgery, but has a history of chronic pain, chrnoic nausea, and chronic constipation with a report of weekly bowel movements. Reports increased nausea and vomiting that began on 8/25 or 8/26, she is unsure which day. She reports being prescribed pepcid, which did not help. Has not taken any OTC meds for her symptoms. Last meal was pre-op, has only been eating small snacks since surgery. Found to have SBO.    PT Comments    Pt standing in room wanting her meds from pharmacy so she can leave.  Pt agreeable to gt training and stair training.  She refused assistance and heavy reliant on rails in halls and counter tops, chairs and door frames.  Pt wanting to keep her independence and strongly voiced this.  Pt to d/c home.     Follow Up Recommendations  No PT follow up     Equipment Recommendations  None recommended by PT    Recommendations for Other Services       Precautions / Restrictions Precautions Precautions: Fall Precaution Comments: severe rightward scoliosis, convexity and rib hump on L and rotation towards R Restrictions Weight Bearing Restrictions: No    Mobility  Bed Mobility               General bed mobility comments: Pt standing in room on arrival.  Transfers                 General transfer comment: Not observed patient standing in room on arrival.  Ambulation/Gait Ambulation/Gait assistance: Supervision Gait Distance (Feet): 200 Feet Assistive device: Rolling walker (2 wheeled) (intermittent use of rail, walls and grabbing to chairs, refused HHA and reports using a cane at home.  Gilmer Mor would be very beneficial to use based on her posturing.) Gait Pattern/deviations:  Step-through pattern;Trunk flexed;Shuffle     General Gait Details: Slow, unsteady gait with flexed trunk and pt reaching for UE support on anything she can grab; no overt LOB and managed to stay standing with out assistance but notably unsteady and would benefit from assistance and use of SPC.   Stairs Stairs: Yes   Stair Management: One rail Right Number of Stairs: 12 General stair comments: Mild unsteadiness with foot clearance.  NO LOB and motivated to complete flight of stairs.   Wheelchair Mobility    Modified Rankin (Stroke Patients Only)       Balance Overall balance assessment: Modified Independent Sitting-balance support: Feet supported Sitting balance-Leahy Scale: Normal     Standing balance support: During functional activity Standing balance-Leahy Scale: Poor                              Cognition Arousal/Alertness: Awake/alert Behavior During Therapy: Agitated Overall Cognitive Status: No family/caregiver present to determine baseline cognitive functioning                                 General Comments: Pt agitated wanting to get her meds from pharmacy and leave.  Pt reports, " the doctors here aren't helping me. this was a complete waste of time. "      Exercises  General Comments        Pertinent Vitals/Pain Pain Assessment: 0-10 Pain Score: 10-Worst pain ever (Reports 10.5) Pain Location: "everywhere", right back/side Pain Descriptors / Indicators: Discomfort;Restless;Moaning Pain Intervention(s): Monitored during session;Repositioned    Home Living                      Prior Function            PT Goals (current goals can now be found in the care plan section) Acute Rehab PT Goals Patient Stated Goal: go home Potential to Achieve Goals: Good Progress towards PT goals: Progressing toward goals    Frequency    Min 2X/week      PT Plan Current plan remains appropriate    Co-evaluation               AM-PAC PT "6 Clicks" Mobility   Outcome Measure  Help needed turning from your back to your side while in a flat bed without using bedrails?: None Help needed moving from lying on your back to sitting on the side of a flat bed without using bedrails?: None Help needed moving to and from a bed to a chair (including a wheelchair)?: None Help needed standing up from a chair using your arms (e.g., wheelchair or bedside chair)?: None Help needed to walk in hospital room?: None Help needed climbing 3-5 steps with a railing? : None 6 Click Score: 24    End of Session   Activity Tolerance: Patient limited by pain Patient left:  (standing in room at door way as this is where she was found and she refused to sit down.) Nurse Communication: Mobility status PT Visit Diagnosis: Muscle weakness (generalized) (M62.81)     Time: 4481-8563 PT Time Calculation (min) (ACUTE ONLY): 17 min  Charges:  $Gait Training: 8-22 mins                     Deborah [name] , PTA Acute Rehabilitation Services Pager [phone] Office [phone]     Deborah [name] 04/07/2020, 4:28 PM

## 2020-04-07 NOTE — Discharge Planning (Signed)
Pt clothing and cell phone returned before discharge.

## 2020-04-07 NOTE — Progress Notes (Signed)
Meds found in patient backpack and they were taken to pharmacy, count verified by patients sister and taken to pharmacy by Lanna Poche, RN. Pt requesting ativan but is very sleepy and lethargic, arousable. Administering SMOG enema to pt, having some gas and small results, will continue to administer as pt is able to tolerate it. Called Trixie Deis, [place] for order for telemonitor. Pt is high risk for fall.

## 2020-04-07 NOTE — Progress Notes (Signed)
Pt discharged. Meds returned to pt from pharmacy.  DC instructions given and explained to pt as well as Rxs for pt to pick up at the pharmacy. Explained to pt how important her follow up appointments were for [facility] and Dr. Malen Gauze.

## 2020-04-07 NOTE — Discharge Instructions (Signed)
Ileus  Ileus is a condition that happens when the intestines, which are also called bowels, stop working correctly. The intestines are hollow organs that digest food after the food leaves the stomach. These organs are long, muscular tubes that connect the stomach to the rectum. When ileus occurs, the muscular contractions that cause food to move through the intestines do not happen as they normally would. If the intestines stop working, food cannot pass through to get digested. This condition is a serious problem that usually requires hospitalization. It can cause symptoms such as nausea, abdominal pain, and bloating. Ileus can last from a few hours to a few days. What are the causes? This condition may be caused by:  Surgery on the abdomen.  An infection or inflammation in the abdomen. This includes inflammation of the lining of the abdomen (peritonitis).  Infection or inflammation in other parts of the body, such as pneumonia or pancreatitis.  Passage of gallstones or kidney stones.  Damage to the nerves or blood vessels that go to the intestines.  A collection of blood within the abdominal cavity.  Imbalance in the salts in the blood (electrolytes).  Injury to the brain or spinal cord.  Medicines. Many medicines, including strong pain medicines, can cause ileus or make it worse. If the intestines stop working because of a blockage, that is a different condition that is called a bowel obstruction. What are the signs or symptoms? Symptoms of this condition include:  Bloating of the abdomen.  Pain or discomfort in the abdomen.  Poor appetite.  Nausea and vomiting.  Lack of normal bowel sounds, such as "growling" in the stomach. How is this diagnosed? This condition may be diagnosed with:  A physical exam and medical history.  X-rays or a CT scan of the abdomen. You may also have other tests to help find the cause of the condition. How is this treated? This condition may  be treated by:  Resting the intestines until they start to work again. This is often done by: ? Stopping oral intake of food and drink. You will be given fluid through an IV to prevent dehydration. ? Placing a small tube (nasogastric tube or NG tube) that is passed through your nose and into your stomach. The tube is attached to a suction device and keeps the stomach emptied out. This allows the bowels to rest and helps to reduce nausea and vomiting.  Correcting any electrolyte imbalance by giving supplements in the IV fluid.  Stopping any medicines that might make ileus worse.  Treating any condition that may have caused ileus. Follow these instructions at home: Eating and drinking   Follow instructions from your health care provider about: ? What to eat and drink. You may be told to start eating a bland diet. Over time, you may slowly resume a more normal, healthy diet. ? How much to eat and drink. You should eat small meals often and stop eating when you feel full.  Avoid alcohol. General instructions  Take over-the-counter and prescription medicines only as told by your health care provider.  Rest as told by your health care provider.  Avoid sitting for a long time without moving. Get up to take short walks every 1-2 hours. Ask for help if you feel weak or unsteady.  Keep all follow-up visits as told by your health care provider. This is important. Contact a health care provider if:  You have nausea, vomiting, or abdominal discomfort.  You have a fever. Get help   right away if:  You have severe abdominal pain or bloating.  You cannot eat or drink without vomiting. Summary  Ileus is a condition that happens when the intestines, which are also called bowels, stop working correctly.  When ileus occurs, the muscular contractions that cause food to move through the intestines do not happen as they normally would.  Ileus can cause symptoms such as nausea, abdominal pain, and  bloating.  Treatment may involve getting IV fluids and having a nasogastric tube placed to keep your stomach emptied out until the intestines start working again. This information is not intended to replace advice given to you by your health care provider. Make sure you discuss any questions you have with your health care provider. Document Revised: 11/13/2017 Document Reviewed: 11/13/2017 Elsevier Patient Education  2020 [employer]  

## 2020-04-07 NOTE — Progress Notes (Signed)
Pt demanding her purse and meds to be given to her. Explained to pt that her meds are locked up in the pharmacy and that our policy does not allow for meds to be in the room.
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
## 2020-04-07 NOTE — Progress Notes (Signed)
Pt yelling at me not to assist her when she was walking around in the room and to the bathroom, informed her that I was trying to keep her safe.

## 2020-04-07 NOTE — Progress Notes (Signed)
[facility] Progress Note     Subjective: Patient reports she is passing a small amount of flatus. Does report nausea and bloating. Refused exam until able to open milk carton and then asking for coffee. Agreeable to try enema later today after coffee. She does report ambulating yesterday.   Objective: Vital signs in last 24 hours: Temp:  [97.8 F (36.6 C)-98.7 F (37.1 C)] 97.8 F (36.6 C) (09/07 0354) Pulse Rate:  [84-99] 84 (09/07 0354) Resp:  [15-17] 15 (09/07 0354) BP: (93-136)/(64-86) 103/67 (09/07 0354) SpO2:  [97 %-98 %] 97 % (09/07 0354) Last BM Date: 04/02/20  Intake/Output from previous day: 09/06 0701 - 09/07 0700 In: 690 [P.O.:690] Out: -  Intake/Output this shift: No intake/output data recorded.  PE: General: WD, cachectic female who is sitting at bedside Lungs: Respiratory effort nonlabored Abd: exam declined Psych: A&Ox3 with a depressed affect.   Lab Results:  No results for input(s): WBC, HGB, HCT, PLT in the last 72 hours. BMET Recent Labs    04/06/20 0113  NA 136  K 4.2  CL 99  CO2 28  GLUCOSE 122*  BUN 12  CREATININE 0.73  CALCIUM 8.4*   PT/INR No results for input(s): LABPROT, INR in the last 72 hours. CMP     Component Value Date/Time   NA 136 04/06/2020 0113   NA 145 05/29/2017 0000   K 4.2 04/06/2020 0113   CL 99 04/06/2020 0113   CO2 28 04/06/2020 0113   GLUCOSE 122 (H) 04/06/2020 0113   BUN 12 04/06/2020 0113   BUN 23 (A) 05/29/2017 0000   CREATININE 0.73 04/06/2020 0113   CALCIUM 8.4 (L) 04/06/2020 0113   PROT 5.6 (L) 03/29/2020 1118   ALBUMIN 3.5 03/29/2020 1118   AST 19 03/29/2020 1118   ALT 20 03/29/2020 1118   ALKPHOS 61 03/29/2020 1118   BILITOT 1.0 03/29/2020 1118   GFRNONAA >60 04/06/2020 0113   GFRAA >60 04/06/2020 0113   Lipase     Component Value Date/Time   LIPASE 20 03/21/2020 0736       Studies/Results: DG Abd 2 Views  Result Date: 04/06/2020 CLINICAL DATA:  Ileus. EXAM: ABDOMEN -  2 VIEW COMPARISON:  April 04, 2020. FINDINGS: Mildly dilated small bowel loops are noted concerning for ileus or distal small bowel obstruction. Residual contrast is seen in the right colon. Large amount of stool is noted in the right colon. There is no evidence of free air. No radio-opaque calculi or other significant radiographic abnormality is seen. IMPRESSION: Mildly dilated small bowel loops are noted concerning for ileus or distal small bowel obstruction. Large amount of stool is noted in right colon. Electronically Signed   By: Lupita Raider M.D.   On: 04/06/2020 09:49    Anti-infectives: Anti-infectives (From admission, onward)   Start     Dose/Rate Route Frequency Ordered Stop   03/30/20 1145  cefTRIAXone (ROCEPHIN) 1 g in sodium chloride 0.9 % 100 mL IVPB        1 g 200 mL/hr over 30 Minutes Intravenous Every 24 hours 03/30/20 1117 04/04/20 1427       Assessment/Plan Depression Chronic pain on chronic narcotics -Dr. Mikael Spray is her PCP and narcoticprescriber. Home meds UTI - Rocephin. Cxw/ multiple species.  Bruise - left thigh/hip  SBO vs ileus Hx ofrepair of strangulated LIH with SBR x1by Dr. Janee Morn on 8/21 for Incarcerated left inguinal hernia, SBO, necrotic knuckle of small bowel - Discharged on 8/25 - then  readmitted on 8/29 - Ileus vs SBO from early adhesions/edema at the anastomosispost op. No evidence of recurrence of hernia on CT 8/29 or exam.  - Patient also with diffuse colonic stool.Dailysuppositoryand bowel regimen -KUB - 04/04/2020 - progression of contrast in colon - KUB yesterday showed large amount of stool in R colon, some level of ileus  - recommend enema today   FEN -Soft VTE -SCDs, Lovenox ID -UTI  - [place] suggests UTI, but <10,000 colonies  Repeat culture on 8/30 - multiple species, recollect if patient agreeable   LOS: 9 days    Deborah [name] , [facility] 04/07/2020, 8:57 AM Please see Amion for pager  number during day hours 7:00am-4:30pm

## 2020-05-11 ENCOUNTER — Telehealth: Payer: Self-pay

## 2020-05-11 NOTE — Telephone Encounter (Signed)
Called pt to r/s her sclero appt from last month. She cx it due to being hospitalized for incarcerated hernia. She is experiencing increased swelling in both LE (R>L) and has mentioned this to her pain MD. She was at [facility] the other day and had an U/S but is unsure of the results. She would like to be seen. Will get her scheduled once I hear back from APP regarding any studies to be ordered for this appt.

## 2020-05-12 ENCOUNTER — Telehealth: Payer: Self-pay

## 2020-05-12 NOTE — Telephone Encounter (Signed)
Called pt back and no answer/no VM set up.

## 2020-05-13 ENCOUNTER — Telehealth: Payer: Self-pay

## 2020-05-13 NOTE — Telephone Encounter (Signed)
Spoke with pt who was seen in May for varicose veins and unable to complete sclero tx d/t hospitalization. She reports increased swelling and pain in legs. She was seen at [facility] recently with U/S results pending. She inquires about an appt. Patient informed after speaking with office PA, Matt E. who recommends pt to contact [facility] for U/S results. No additional studies warranted. Sclero is purely cosmetic and will not tx swelling or pain. Pt agrees to contact [facility]. States she will also contact her pain mgt doctor and will callback when ready to r/s sclero.

## 2020-05-14 ENCOUNTER — Ambulatory Visit (HOSPITAL_COMMUNITY)
Admission: EM | Admit: 2020-05-14 | Discharge: 2020-05-14 | Disposition: A | Discharge status: Home / Self Care | Payer: Medicare Other | Attending: Urgent Care | Admitting: Urgent Care

## 2020-05-14 ENCOUNTER — Other Ambulatory Visit: Payer: Self-pay

## 2020-05-14 DIAGNOSIS — M79604 Pain in right leg: Secondary | ICD-10-CM

## 2020-05-14 DIAGNOSIS — L03115 Cellulitis of right lower limb: Secondary | ICD-10-CM | POA: Diagnosis not present

## 2020-05-14 DIAGNOSIS — G8929 Other chronic pain: Secondary | ICD-10-CM

## 2020-05-14 DIAGNOSIS — M79605 Pain in left leg: Secondary | ICD-10-CM

## 2020-05-14 MED ORDER — NAPROXEN 500 MG PO TABS: 500.0000 mg | ORAL_TABLET | Freq: Two times a day (BID) | ORAL | 0 refills | Status: DC

## 2020-05-14 MED ORDER — DOXYCYCLINE HYCLATE 100 MG PO CAPS: 100.0000 mg | ORAL_CAPSULE | Freq: Two times a day (BID) | ORAL | 0 refills | Status: DC

## 2020-05-14 NOTE — ED Triage Notes (Signed)
PT reports since yesterday bilateral leg pain with redness to skin . Pt reports she can not put on shoes. Pt falling asleep during Triage . Pt reports she cannot sleep due to the pain.
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
## 2020-08-04 ENCOUNTER — Other Ambulatory Visit: Payer: Self-pay

## 2020-08-04 DIAGNOSIS — I83899 Varicose veins of unspecified lower extremities with other complications: Secondary | ICD-10-CM

## 2020-08-19 ENCOUNTER — Ambulatory Visit (HOSPITAL_COMMUNITY)
Admission: RE | Admit: 2020-08-19 | Discharge: 2020-08-19 | Disposition: A | Discharge status: Home / Self Care | Payer: Medicare Other | Source: Ambulatory Visit | Attending: Vascular Surgery | Admitting: Vascular Surgery

## 2020-08-19 ENCOUNTER — Other Ambulatory Visit: Payer: Self-pay

## 2020-08-19 ENCOUNTER — Encounter: Payer: Self-pay | Admitting: Vascular Surgery

## 2020-08-19 ENCOUNTER — Ambulatory Visit (INDEPENDENT_AMBULATORY_CARE_PROVIDER_SITE_OTHER): Payer: Medicare Other | Admitting: Vascular Surgery

## 2020-08-19 VITALS — BP 123/81 | HR 81 | Temp 97.7°F | Resp 20 | Ht 63.0 in | Wt 113.0 lb

## 2020-08-19 DIAGNOSIS — I83899 Varicose veins of unspecified lower extremities with other complications: Secondary | ICD-10-CM | POA: Diagnosis present

## 2020-08-19 DIAGNOSIS — I890 Lymphedema, not elsewhere classified: Secondary | ICD-10-CM

## 2020-08-19 DIAGNOSIS — I872 Venous insufficiency (chronic) (peripheral): Secondary | ICD-10-CM
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
## 2020-08-28 ENCOUNTER — Other Ambulatory Visit: Payer: Self-pay | Admitting: Lab

## 2020-08-28 ENCOUNTER — Other Ambulatory Visit: Payer: Self-pay | Admitting: Student

## 2020-08-28 DIAGNOSIS — M4125 Other idiopathic scoliosis, thoracolumbar region: Secondary | ICD-10-CM

## 2020-08-31 ENCOUNTER — Other Ambulatory Visit: Payer: Self-pay | Admitting: Student

## 2020-08-31 DIAGNOSIS — M4125 Other idiopathic scoliosis, thoracolumbar region: Secondary | ICD-10-CM

## 2020-09-15 ENCOUNTER — Other Ambulatory Visit: Payer: Self-pay

## 2020-09-15 ENCOUNTER — Ambulatory Visit
Admission: RE | Admit: 2020-09-15 | Discharge: 2020-09-15 | Disposition: A | Discharge status: Home / Self Care | Payer: Medicare Other | Source: Ambulatory Visit | Attending: *Deleted | Admitting: *Deleted

## 2020-09-15 DIAGNOSIS — M4125 Other idiopathic scoliosis, thoracolumbar region: Secondary | ICD-10-CM

## 2020-09-28 ENCOUNTER — Other Ambulatory Visit: Payer: Self-pay | Admitting: Orthopedic Surgery

## 2020-09-28 DIAGNOSIS — M4125 Other idiopathic scoliosis, thoracolumbar region: Secondary | ICD-10-CM

## 2020-09-28 DIAGNOSIS — M546 Pain in thoracic spine: Secondary | ICD-10-CM

## 2020-09-28 DIAGNOSIS — M545 Low back pain, unspecified: Secondary | ICD-10-CM

## 2020-10-17 ENCOUNTER — Other Ambulatory Visit: Payer: Medicare Other

## 2020-10-22 ENCOUNTER — Ambulatory Visit: Payer: Medicare Other | Admitting: Orthopedic Surgery

## 2020-10-27 ENCOUNTER — Ambulatory Visit: Payer: Medicare Other | Admitting: Orthopedic Surgery

## 2020-10-28 ENCOUNTER — Ambulatory Visit
Admission: RE | Admit: 2020-10-28 | Discharge: 2020-10-28 | Disposition: A | Discharge status: Home / Self Care | Payer: Medicare Other | Source: Ambulatory Visit | Attending: Orthopedic Surgery | Admitting: Orthopedic Surgery

## 2020-10-28 ENCOUNTER — Other Ambulatory Visit: Payer: Self-pay

## 2020-10-28 DIAGNOSIS — M546 Pain in thoracic spine: Secondary | ICD-10-CM

## 2020-10-28 DIAGNOSIS — M545 Low back pain, unspecified: Secondary | ICD-10-CM

## 2020-10-28 DIAGNOSIS — M4125 Other idiopathic scoliosis, thoracolumbar region: Secondary | ICD-10-CM

## 2020-11-12 ENCOUNTER — Encounter: Payer: Self-pay | Admitting: Orthopedic Surgery

## 2020-11-12 ENCOUNTER — Ambulatory Visit: Payer: Self-pay

## 2020-11-12 ENCOUNTER — Ambulatory Visit (INDEPENDENT_AMBULATORY_CARE_PROVIDER_SITE_OTHER): Payer: Medicare Other | Admitting: Physician Assistant

## 2020-11-12 ENCOUNTER — Other Ambulatory Visit: Payer: Self-pay

## 2020-11-12 DIAGNOSIS — M79671 Pain in right foot: Secondary | ICD-10-CM | POA: Diagnosis not present
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
## 2020-12-29 ENCOUNTER — Ambulatory Visit (HOSPITAL_BASED_OUTPATIENT_CLINIC_OR_DEPARTMENT_OTHER): Payer: Medicare Other | Admitting: Physical Therapy

## 2020-12-31 ENCOUNTER — Encounter (HOSPITAL_BASED_OUTPATIENT_CLINIC_OR_DEPARTMENT_OTHER): Payer: Self-pay | Admitting: Physical Therapy

## 2020-12-31 ENCOUNTER — Other Ambulatory Visit: Payer: Self-pay

## 2020-12-31 ENCOUNTER — Ambulatory Visit (HOSPITAL_BASED_OUTPATIENT_CLINIC_OR_DEPARTMENT_OTHER): Payer: Medicare Other | Attending: Neurosurgery | Admitting: Physical Therapy

## 2020-12-31 DIAGNOSIS — M546 Pain in thoracic spine: Secondary | ICD-10-CM | POA: Insufficient documentation

## 2020-12-31 DIAGNOSIS — R2689 Other abnormalities of gait and mobility: Secondary | ICD-10-CM | POA: Insufficient documentation

## 2020-12-31 DIAGNOSIS — M545 Low back pain, unspecified: Secondary | ICD-10-CM | POA: Insufficient documentation

## 2020-12-31 DIAGNOSIS — R293 Abnormal posture: Secondary | ICD-10-CM | POA: Insufficient documentation

## 2020-12-31 DIAGNOSIS — G8929 Other chronic pain: Secondary | ICD-10-CM | POA: Insufficient documentation
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
## 2020-12-31 NOTE — Patient Instructions (Signed)
Access Code: [email or web address] URL: [email or web address] Date: 12/31/2020 Prepared by: Lorayne Bender  Exercises Seated Abdominal Press into [employer] - 1 x daily - 7 x weekly - 3 sets - 10 reps Standing Shoulder Flexion AAROM with Swiss Ball - 1 x daily - 7 x weekly - 3 sets - 10 reps Shoulder Flexion Wall Slide with Towel - 1 x daily - 7 x weekly - 3 sets - 10 reps

## 2021-01-04 ENCOUNTER — Encounter (HOSPITAL_BASED_OUTPATIENT_CLINIC_OR_DEPARTMENT_OTHER): Payer: Self-pay | Admitting: Physical Therapy

## 2021-01-04 ENCOUNTER — Other Ambulatory Visit: Payer: Self-pay

## 2021-01-04 ENCOUNTER — Ambulatory Visit (HOSPITAL_BASED_OUTPATIENT_CLINIC_OR_DEPARTMENT_OTHER): Payer: Medicare Other | Admitting: Physical Therapy

## 2021-01-04 DIAGNOSIS — R293 Abnormal posture: Secondary | ICD-10-CM

## 2021-01-04 DIAGNOSIS — M546 Pain in thoracic spine: Secondary | ICD-10-CM

## 2021-01-04 DIAGNOSIS — M545 Low back pain, unspecified: Secondary | ICD-10-CM | POA: Diagnosis not present

## 2021-01-04 DIAGNOSIS — G8929 Other chronic pain: Secondary | ICD-10-CM

## 2021-01-04 DIAGNOSIS — R2689 Other abnormalities of gait and mobility: Secondary | ICD-10-CM
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
## 2021-01-07 ENCOUNTER — Telehealth (HOSPITAL_BASED_OUTPATIENT_CLINIC_OR_DEPARTMENT_OTHER): Payer: Self-pay | Admitting: Physical Therapy

## 2021-01-07 ENCOUNTER — Ambulatory Visit (HOSPITAL_BASED_OUTPATIENT_CLINIC_OR_DEPARTMENT_OTHER): Payer: Medicare Other | Admitting: Physical Therapy

## 2021-01-07 NOTE — Telephone Encounter (Signed)
Attempted to contact pt via phone call without answer.  Will keep next visit scheduled cancelling remaining visits if no show.

## 2021-01-08 ENCOUNTER — Other Ambulatory Visit: Payer: Self-pay

## 2021-01-08 ENCOUNTER — Ambulatory Visit (HOSPITAL_BASED_OUTPATIENT_CLINIC_OR_DEPARTMENT_OTHER): Payer: Medicare Other | Admitting: Physical Therapy

## 2021-01-08 ENCOUNTER — Encounter (HOSPITAL_BASED_OUTPATIENT_CLINIC_OR_DEPARTMENT_OTHER): Payer: Self-pay | Admitting: Physical Therapy

## 2021-01-08 DIAGNOSIS — G8929 Other chronic pain: Secondary | ICD-10-CM

## 2021-01-08 DIAGNOSIS — M545 Low back pain, unspecified: Secondary | ICD-10-CM

## 2021-01-08 DIAGNOSIS — R2689 Other abnormalities of gait and mobility: Secondary | ICD-10-CM

## 2021-01-08 DIAGNOSIS — M546 Pain in thoracic spine: Secondary | ICD-10-CM

## 2021-01-08 DIAGNOSIS — R293 Abnormal posture: Secondary | ICD-10-CM
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
## 2021-01-11 ENCOUNTER — Other Ambulatory Visit: Payer: Self-pay

## 2021-01-11 ENCOUNTER — Ambulatory Visit (HOSPITAL_BASED_OUTPATIENT_CLINIC_OR_DEPARTMENT_OTHER): Payer: Medicare Other | Admitting: Physical Therapy

## 2021-01-11 DIAGNOSIS — M546 Pain in thoracic spine: Secondary | ICD-10-CM

## 2021-01-11 DIAGNOSIS — R293 Abnormal posture: Secondary | ICD-10-CM

## 2021-01-11 DIAGNOSIS — M545 Low back pain, unspecified: Secondary | ICD-10-CM

## 2021-01-11 DIAGNOSIS — R2689 Other abnormalities of gait and mobility: Secondary | ICD-10-CM

## 2021-01-11 DIAGNOSIS — G8929 Other chronic pain: Secondary | ICD-10-CM

## 2021-01-12 ENCOUNTER — Encounter (HOSPITAL_BASED_OUTPATIENT_CLINIC_OR_DEPARTMENT_OTHER): Payer: Self-pay | Admitting: Physical Therapy
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
## 2021-01-14 ENCOUNTER — Encounter (HOSPITAL_BASED_OUTPATIENT_CLINIC_OR_DEPARTMENT_OTHER): Payer: Self-pay | Admitting: Physical Therapy

## 2021-01-14 ENCOUNTER — Ambulatory Visit (HOSPITAL_BASED_OUTPATIENT_CLINIC_OR_DEPARTMENT_OTHER): Payer: Medicare Other | Admitting: Physical Therapy

## 2021-01-14 ENCOUNTER — Other Ambulatory Visit: Payer: Self-pay

## 2021-01-14 DIAGNOSIS — R2689 Other abnormalities of gait and mobility: Secondary | ICD-10-CM

## 2021-01-14 DIAGNOSIS — M546 Pain in thoracic spine: Secondary | ICD-10-CM

## 2021-01-14 DIAGNOSIS — G8929 Other chronic pain: Secondary | ICD-10-CM

## 2021-01-14 DIAGNOSIS — R293 Abnormal posture: Secondary | ICD-10-CM

## 2021-01-14 DIAGNOSIS — M545 Low back pain, unspecified: Secondary | ICD-10-CM
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
## 2021-01-18 ENCOUNTER — Ambulatory Visit (HOSPITAL_BASED_OUTPATIENT_CLINIC_OR_DEPARTMENT_OTHER): Payer: Medicare Other | Admitting: Physical Therapy

## 2021-01-18 ENCOUNTER — Other Ambulatory Visit: Payer: Self-pay

## 2021-01-18 ENCOUNTER — Encounter (HOSPITAL_BASED_OUTPATIENT_CLINIC_OR_DEPARTMENT_OTHER): Payer: Self-pay | Admitting: Physical Therapy

## 2021-01-18 DIAGNOSIS — M545 Low back pain, unspecified: Secondary | ICD-10-CM

## 2021-01-18 DIAGNOSIS — R293 Abnormal posture: Secondary | ICD-10-CM

## 2021-01-18 DIAGNOSIS — G8929 Other chronic pain: Secondary | ICD-10-CM

## 2021-01-18 DIAGNOSIS — M546 Pain in thoracic spine: Secondary | ICD-10-CM

## 2021-01-18 DIAGNOSIS — R2689 Other abnormalities of gait and mobility: Secondary | ICD-10-CM
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
## 2021-01-20 ENCOUNTER — Ambulatory Visit (HOSPITAL_BASED_OUTPATIENT_CLINIC_OR_DEPARTMENT_OTHER): Payer: Medicare Other | Admitting: Physical Therapy

## 2021-01-21 ENCOUNTER — Ambulatory Visit (HOSPITAL_BASED_OUTPATIENT_CLINIC_OR_DEPARTMENT_OTHER): Payer: Medicare Other | Admitting: Physical Therapy

## 2021-01-25 ENCOUNTER — Ambulatory Visit (HOSPITAL_BASED_OUTPATIENT_CLINIC_OR_DEPARTMENT_OTHER): Payer: Medicare Other | Admitting: Physical Therapy

## 2021-01-25 ENCOUNTER — Other Ambulatory Visit: Payer: Self-pay

## 2021-01-25 DIAGNOSIS — R2689 Other abnormalities of gait and mobility: Secondary | ICD-10-CM

## 2021-01-25 DIAGNOSIS — M546 Pain in thoracic spine: Secondary | ICD-10-CM

## 2021-01-25 DIAGNOSIS — M545 Low back pain, unspecified: Secondary | ICD-10-CM | POA: Diagnosis not present

## 2021-01-25 DIAGNOSIS — G8929 Other chronic pain: Secondary | ICD-10-CM

## 2021-01-25 DIAGNOSIS — R293 Abnormal posture: Secondary | ICD-10-CM

## 2021-01-26 ENCOUNTER — Encounter (HOSPITAL_BASED_OUTPATIENT_CLINIC_OR_DEPARTMENT_OTHER): Payer: Self-pay | Admitting: Physical Therapy
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
## 2021-01-28 ENCOUNTER — Ambulatory Visit (HOSPITAL_BASED_OUTPATIENT_CLINIC_OR_DEPARTMENT_OTHER): Payer: Medicare Other | Admitting: Physical Therapy

## 2021-01-29 ENCOUNTER — Ambulatory Visit (HOSPITAL_BASED_OUTPATIENT_CLINIC_OR_DEPARTMENT_OTHER): Payer: Medicare Other | Admitting: Physical Therapy

## 2021-01-31 ENCOUNTER — Encounter (HOSPITAL_COMMUNITY): Payer: Self-pay | Admitting: Emergency Medicine

## 2021-01-31 ENCOUNTER — Other Ambulatory Visit: Payer: Self-pay

## 2021-01-31 ENCOUNTER — Inpatient Hospital Stay (HOSPITAL_COMMUNITY)
Admission: EM | Admit: 2021-01-31 | Discharge: 2021-02-03 | DRG: 389 | Disposition: A | Discharge status: Home / Self Care | Payer: Medicare Other | Attending: Internal Medicine | Admitting: Internal Medicine

## 2021-01-31 ENCOUNTER — Emergency Department (HOSPITAL_COMMUNITY): Payer: Medicare Other

## 2021-01-31 DIAGNOSIS — K219 Gastro-esophageal reflux disease without esophagitis: Secondary | ICD-10-CM | POA: Diagnosis present

## 2021-01-31 DIAGNOSIS — K56600 Partial intestinal obstruction, unspecified as to cause: Secondary | ICD-10-CM | POA: Diagnosis present

## 2021-01-31 DIAGNOSIS — K449 Diaphragmatic hernia without obstruction or gangrene: Secondary | ICD-10-CM | POA: Diagnosis present

## 2021-01-31 DIAGNOSIS — F332 Major depressive disorder, recurrent severe without psychotic features: Secondary | ICD-10-CM | POA: Diagnosis present

## 2021-01-31 DIAGNOSIS — K5651 Intestinal adhesions [bands], with partial obstruction: Secondary | ICD-10-CM | POA: Diagnosis not present

## 2021-01-31 DIAGNOSIS — F17210 Nicotine dependence, cigarettes, uncomplicated: Secondary | ICD-10-CM | POA: Diagnosis present

## 2021-01-31 DIAGNOSIS — D751 Secondary polycythemia: Secondary | ICD-10-CM | POA: Diagnosis present

## 2021-01-31 DIAGNOSIS — Z79899 Other long term (current) drug therapy: Secondary | ICD-10-CM

## 2021-01-31 DIAGNOSIS — F909 Attention-deficit hyperactivity disorder, unspecified type: Secondary | ICD-10-CM | POA: Diagnosis present

## 2021-01-31 DIAGNOSIS — Z881 Allergy status to other antibiotic agents status: Secondary | ICD-10-CM

## 2021-01-31 DIAGNOSIS — G894 Chronic pain syndrome: Secondary | ICD-10-CM | POA: Diagnosis present

## 2021-01-31 DIAGNOSIS — Z806 Family history of leukemia: Secondary | ICD-10-CM

## 2021-01-31 DIAGNOSIS — Z20822 Contact with and (suspected) exposure to covid-19: Secondary | ICD-10-CM | POA: Diagnosis present

## 2021-01-31 DIAGNOSIS — Z0189 Encounter for other specified special examinations: Secondary | ICD-10-CM

## 2021-01-31 DIAGNOSIS — G2581 Restless legs syndrome: Secondary | ICD-10-CM | POA: Diagnosis present

## 2021-01-31 DIAGNOSIS — K56609 Unspecified intestinal obstruction, unspecified as to partial versus complete obstruction: Secondary | ICD-10-CM

## 2021-01-31 DIAGNOSIS — Z87820 Personal history of traumatic brain injury: Secondary | ICD-10-CM

## 2021-01-31 DIAGNOSIS — M419 Scoliosis, unspecified: Secondary | ICD-10-CM | POA: Diagnosis present

## 2021-01-31 DIAGNOSIS — R1084 Generalized abdominal pain: Secondary | ICD-10-CM | POA: Diagnosis not present

## 2021-01-31 LAB — COMPREHENSIVE METABOLIC PANEL
ALT: 18 U/L (ref 0–44)
AST: 18 U/L (ref 15–41)
Albumin: 4.3 g/dL (ref 3.5–5.0)
Alkaline Phosphatase: 64 U/L (ref 38–126)
Anion gap: 10 (ref 5–15)
BUN: 12 mg/dL (ref 8–23)
CO2: 30 mmol/L (ref 22–32)
Calcium: 9.8 mg/dL (ref 8.9–10.3)
Chloride: 97 mmol/L — ABNORMAL LOW (ref 98–111)
Creatinine, Ser: 0.77 mg/dL (ref 0.44–1.00)
GFR, Estimated: >60 mL/min (ref >60)
Glucose, Bld: 122 mg/dL — ABNORMAL HIGH (ref 70–99)
Potassium: 5.2 mmol/L — ABNORMAL HIGH (ref 3.5–5.1)
Sodium: 137 mmol/L (ref 135–145)
Total Bilirubin: 0.8 mg/dL (ref 0.3–1.2)
Total Protein: 7.2 g/dL (ref 6.5–8.1)

## 2021-01-31 LAB — CBC
HCT: 53.1 % — ABNORMAL HIGH (ref 36.0–46.0)
Hemoglobin: 17.2 g/dL — ABNORMAL HIGH (ref 12.0–15.0)
MCH: 29.3 pg (ref 26.0–34.0)
MCHC: 32.4 g/dL (ref 30.0–36.0)
MCV: 90.3 fL (ref 80.0–100.0)
Platelets: 305 10*3/uL (ref 150–400)
RBC: 5.88 MIL/uL — ABNORMAL HIGH (ref 3.87–5.11)
RDW: 13.4 % (ref 11.5–15.5)
WBC: 6 10*3/uL (ref 4.0–10.5)
nRBC: 0 % (ref 0.0–0.2)

## 2021-01-31 LAB — LIPASE, BLOOD: Lipase: 22 U/L (ref 11–51)

## 2021-01-31 LAB — POTASSIUM: Potassium: 4.2 mmol/L (ref 3.5–5.1)

## 2021-01-31 MED ORDER — ONDANSETRON 4 MG PO TBDP
4.0000 mg | ORAL_TABLET | Freq: Once | ORAL | Status: AC
  Administered 2021-01-31: 4 mg via ORAL
  Filled 2021-01-31: qty 1

## 2021-01-31 MED ORDER — MORPHINE SULFATE (PF) 4 MG/ML IV SOLN
4.0000 mg | Freq: Once | INTRAVENOUS | Status: AC
  Administered 2021-01-31: 4 mg via INTRAVENOUS
  Filled 2021-01-31: qty 1

## 2021-01-31 MED ORDER — IOHEXOL 9 MG/ML PO SOLN
ORAL | Status: AC
  Filled 2021-01-31: qty 1000

## 2021-01-31 MED ORDER — IOHEXOL 300 MG/ML  SOLN
100.0000 mL | Freq: Once | INTRAMUSCULAR | Status: AC | PRN
  Administered 2021-01-31: 100 mL via INTRAVENOUS

## 2021-01-31 MED ORDER — SODIUM CHLORIDE 0.9 % IV BOLUS
1000.0000 mL | Freq: Once | INTRAVENOUS | Status: AC
  Administered 2021-01-31: 1000 mL via INTRAVENOUS

## 2021-01-31 MED ORDER — HYDROMORPHONE HCL 1 MG/ML IJ SOLN
1.0000 mg | Freq: Once | INTRAMUSCULAR | Status: AC
  Administered 2021-01-31: 1 mg via INTRAVENOUS
  Filled 2021-01-31: qty 1

## 2021-01-31 NOTE — ED Provider Notes (Signed)
NauseaEmergency Medicine Provider Triage Evaluation Note  Deborah [name] , a 65 y.o. female  was evaluated in triage.  Pt complains of who presents with concern for central abdominal pain with associated nausea x2 days.  1 episode of NBNB emesis today.  Most recent bowel movement 2 days ago.  Patient does have severe scoliosis and therefore has chronic constipation.  States that her pain feels similar to when she had an incarcerated hernia in the past and had undergone surgical repair.  Visibly uncomfortable in the emergency department triage area3.  Review of Systems  Positive: Abdominal pain, nausea, vomiting, chills, constipation Negative: Chest pain, shortness of breath, diarrhea, dysuria, hematuria  Physical Exam  BP (!) 139/104 (BP Location: Left Arm)   Pulse 93   Temp (!) 97.4 F (36.3 C) (Oral)   Resp 14   SpO2 100%  Gen:   Awake, no distress, but does appear uncomfortable Resp:  Normal effort  MSK:   Moves extremities without difficulty  Other:  Abdomen generally tender to palpation primarily in the periumbilical area, soft.  No CVAT.  Scoliosis.  Medical Decision Making  Medically screening exam initiated at 7:00 PM.  Appropriate orders placed.  LUNDEN STIEBER was informed that the remainder of the evaluation will be completed by another provider, this initial triage assessment does not replace that evaluation, and the importance of remaining in the ED until their evaluation is complete.  This chart was dictated using voice recognition software, Dragon. Despite the best efforts of this provider to proofread and correct errors, errors may still occur which can change documentation meaning.    Sherrilee Gilles 01/31/21 1901    Koleen Distance, MD 01/31/21 2102
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
## 2021-01-31 NOTE — ED Triage Notes (Signed)
Report from [facility].  Pt from home.  Reports generalized abd pain x 2 days.  Nausea and 1 episode of vomiting today.  Denies diarrhea.  History of incarcerated hernia and states pain similar.

## 2021-01-31 NOTE — ED Provider Notes (Signed)
Here for abdominal pain, limited vomiting, constipation, subjective fever H/O SBO Pending CT.   Review of CT shows SBO with transition point in the left pelvis, likely due to adhesions. This was discussed with Dr. Derrell Lolling of general surgery who advises admit to hospitalist service, start SBO protocol and they will consult in the morning.     Elpidio Anis, PA-C 02/01/21 0119    Wynetta Fines, MD 02/01/21 Paulo Fruit

## 2021-02-01 ENCOUNTER — Inpatient Hospital Stay (HOSPITAL_COMMUNITY): Payer: Medicare Other

## 2021-02-01 ENCOUNTER — Encounter (HOSPITAL_COMMUNITY): Payer: Self-pay | Admitting: Family Medicine

## 2021-02-01 DIAGNOSIS — Z20822 Contact with and (suspected) exposure to covid-19: Secondary | ICD-10-CM | POA: Diagnosis present

## 2021-02-01 DIAGNOSIS — Z87820 Personal history of traumatic brain injury: Secondary | ICD-10-CM | POA: Diagnosis not present

## 2021-02-01 DIAGNOSIS — R1084 Generalized abdominal pain: Secondary | ICD-10-CM | POA: Diagnosis present

## 2021-02-01 DIAGNOSIS — G894 Chronic pain syndrome: Secondary | ICD-10-CM | POA: Insufficient documentation

## 2021-02-01 DIAGNOSIS — D751 Secondary polycythemia: Secondary | ICD-10-CM | POA: Diagnosis present

## 2021-02-01 DIAGNOSIS — Z881 Allergy status to other antibiotic agents status: Secondary | ICD-10-CM | POA: Diagnosis not present

## 2021-02-01 DIAGNOSIS — Z79899 Other long term (current) drug therapy: Secondary | ICD-10-CM | POA: Diagnosis not present

## 2021-02-01 DIAGNOSIS — Z806 Family history of leukemia: Secondary | ICD-10-CM | POA: Diagnosis not present

## 2021-02-01 DIAGNOSIS — M419 Scoliosis, unspecified: Secondary | ICD-10-CM | POA: Diagnosis present

## 2021-02-01 DIAGNOSIS — K56600 Partial intestinal obstruction, unspecified as to cause: Secondary | ICD-10-CM | POA: Diagnosis present

## 2021-02-01 DIAGNOSIS — K449 Diaphragmatic hernia without obstruction or gangrene: Secondary | ICD-10-CM | POA: Diagnosis present

## 2021-02-01 DIAGNOSIS — F17210 Nicotine dependence, cigarettes, uncomplicated: Secondary | ICD-10-CM | POA: Diagnosis present

## 2021-02-01 DIAGNOSIS — F332 Major depressive disorder, recurrent severe without psychotic features: Secondary | ICD-10-CM

## 2021-02-01 DIAGNOSIS — G2581 Restless legs syndrome: Secondary | ICD-10-CM | POA: Diagnosis present

## 2021-02-01 DIAGNOSIS — K219 Gastro-esophageal reflux disease without esophagitis: Secondary | ICD-10-CM | POA: Diagnosis present

## 2021-02-01 DIAGNOSIS — K5651 Intestinal adhesions [bands], with partial obstruction: Secondary | ICD-10-CM | POA: Diagnosis present

## 2021-02-01 DIAGNOSIS — F909 Attention-deficit hyperactivity disorder, unspecified type: Secondary | ICD-10-CM | POA: Diagnosis present

## 2021-02-01 LAB — CBC
HCT: 46 % (ref 36.0–46.0)
Hemoglobin: 14.6 g/dL (ref 12.0–15.0)
MCH: 29 pg (ref 26.0–34.0)
MCHC: 31.7 g/dL (ref 30.0–36.0)
MCV: 91.3 fL (ref 80.0–100.0)
Platelets: 269 10*3/uL (ref 150–400)
RBC: 5.04 MIL/uL (ref 3.87–5.11)
RDW: 13.8 % (ref 11.5–15.5)
WBC: 3.5 10*3/uL — ABNORMAL LOW (ref 4.0–10.5)
nRBC: 0 % (ref 0.0–0.2)

## 2021-02-01 LAB — BASIC METABOLIC PANEL
Anion gap: 9 (ref 5–15)
BUN: 10 mg/dL (ref 8–23)
CO2: 27 mmol/L (ref 22–32)
Calcium: 8.8 mg/dL — ABNORMAL LOW (ref 8.9–10.3)
Chloride: 102 mmol/L (ref 98–111)
Creatinine, Ser: 0.71 mg/dL (ref 0.44–1.00)
GFR, Estimated: >60 mL/min (ref >60)
Glucose, Bld: 111 mg/dL — ABNORMAL HIGH (ref 70–99)
Potassium: 4.3 mmol/L (ref 3.5–5.1)
Sodium: 138 mmol/L (ref 135–145)

## 2021-02-01 LAB — RESP PANEL BY RT-PCR (FLU A&B, COVID) ARPGX2
Influenza A by PCR: NEGATIVE
Influenza B by PCR: NEGATIVE
SARS Coronavirus 2 by RT PCR: NEGATIVE

## 2021-02-01 MED ORDER — PHENOL 1.4 % MT LIQD
1.0000 | OROMUCOSAL | Status: DC | PRN
  Administered 2021-02-01: 1 via OROMUCOSAL
  Filled 2021-02-01: qty 177

## 2021-02-01 MED ORDER — LACTATED RINGERS IV SOLN: INTRAVENOUS | Status: AC

## 2021-02-01 MED ORDER — ONDANSETRON HCL 4 MG PO TABS: 4.0000 mg | ORAL_TABLET | Freq: Four times a day (QID) | ORAL | Status: DC | PRN

## 2021-02-01 MED ORDER — HYDROMORPHONE HCL 1 MG/ML IJ SOLN
0.5000 mg | INTRAMUSCULAR | Status: DC | PRN
  Administered 2021-02-01 – 2021-02-02 (×9): 1 mg via INTRAVENOUS
  Filled 2021-02-01 (×9): qty 1

## 2021-02-01 MED ORDER — DIATRIZOATE MEGLUMINE & SODIUM 66-10 % PO SOLN
90.0000 mL | Freq: Once | ORAL | Status: AC
  Administered 2021-02-01: 90 mL via NASOGASTRIC
  Filled 2021-02-01: qty 90

## 2021-02-01 MED ORDER — PROCHLORPERAZINE EDISYLATE 10 MG/2ML IJ SOLN: 10.0000 mg | Freq: Four times a day (QID) | INTRAMUSCULAR | Status: DC | PRN

## 2021-02-01 MED ORDER — HYDROMORPHONE HCL 1 MG/ML IJ SOLN
0.5000 mg | Freq: Once | INTRAMUSCULAR | Status: AC
  Administered 2021-02-01: 0.5 mg via INTRAVENOUS
  Filled 2021-02-01: qty 1

## 2021-02-01 MED ORDER — ONDANSETRON HCL 4 MG/2ML IJ SOLN
4.0000 mg | Freq: Four times a day (QID) | INTRAMUSCULAR | Status: DC | PRN
  Administered 2021-02-01 (×2): 4 mg via INTRAVENOUS
  Filled 2021-02-01 (×2): qty 2

## 2021-02-01 MED ORDER — PANTOPRAZOLE SODIUM 40 MG IV SOLR
40.0000 mg | Freq: Every day | INTRAVENOUS | Status: DC
  Administered 2021-02-01 – 2021-02-02 (×2): 40 mg via INTRAVENOUS
  Filled 2021-02-01 (×2): qty 40
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
## 2021-02-01 NOTE — ED Notes (Signed)
Called Xray for placement verification of NG tube. Will admin Gastrografin after placement verified.

## 2021-02-01 NOTE — ED Notes (Signed)
Pt refusing NG tube.

## 2021-02-01 NOTE — Plan of Care (Signed)

## 2021-02-01 NOTE — Progress Notes (Signed)
[facility] PLAN OF CARE NOTE Patient: Deborah [name] WUJ:[id number]   PCP: [name], MD DOB: 1956/06/28   DOA: 01/31/2021   DOS: 02/01/2021    Patient was admitted by my colleague earlier on 02/01/2021. I have reviewed the H&P as well as assessment and plan and agree with the same. Important changes in the plan are listed below.  Plan of care: Principal Problem:   Partial small bowel obstruction (HCC) Active Problems:   MDD (major depressive disorder), recurrent severe, without psychosis (HCC) X-ray abdomen shows improvement.  No evidence of bowel obstruction. CT as well as x-ray shows a significant stool burden. Will initiate Dulcolax suppository. Continue NG tube. Not passing gas.  Also had questions about her scoliosis and prognosis for that.  Unfortunately I will not be able provide her any prognosis information.  Author: Lynden Oxford, MD [facility] 02/01/2021 4:02 PM   If 7PM-7AM, please contact night-coverage at [email or web address]

## 2021-02-02 ENCOUNTER — Ambulatory Visit (HOSPITAL_BASED_OUTPATIENT_CLINIC_OR_DEPARTMENT_OTHER): Payer: Medicare Other | Admitting: Physical Therapy

## 2021-02-02 LAB — CBC
HCT: 38.2 % (ref 36.0–46.0)
Hemoglobin: 12.6 g/dL (ref 12.0–15.0)
MCH: 29.9 pg (ref 26.0–34.0)
MCHC: 33 g/dL (ref 30.0–36.0)
MCV: 90.5 fL (ref 80.0–100.0)
Platelets: 245 10*3/uL (ref 150–400)
RBC: 4.22 MIL/uL (ref 3.87–5.11)
RDW: 13.5 % (ref 11.5–15.5)
WBC: 5.3 10*3/uL (ref 4.0–10.5)
nRBC: 0 % (ref 0.0–0.2)

## 2021-02-02 LAB — BASIC METABOLIC PANEL
Anion gap: 8 (ref 5–15)
BUN: 13 mg/dL (ref 8–23)
CO2: 28 mmol/L (ref 22–32)
Calcium: 8.4 mg/dL — ABNORMAL LOW (ref 8.9–10.3)
Chloride: 103 mmol/L (ref 98–111)
Creatinine, Ser: 0.65 mg/dL (ref 0.44–1.00)
GFR, Estimated: >60 mL/min (ref >60)
Glucose, Bld: 91 mg/dL (ref 70–99)
Potassium: 3.9 mmol/L (ref 3.5–5.1)
Sodium: 139 mmol/L (ref 135–145)

## 2021-02-02 MED ORDER — HYDROCODONE-ACETAMINOPHEN 5-325 MG PO TABS: 1.0000 | ORAL_TABLET | ORAL | Status: DC | PRN

## 2021-02-02 MED ORDER — OXYCODONE HCL 5 MG PO TABS
5.0000 mg | ORAL_TABLET | ORAL | Status: DC | PRN
  Filled 2021-02-02: qty 1

## 2021-02-02 MED ORDER — BISACODYL 10 MG RE SUPP
10.0000 mg | Freq: Once | RECTAL | Status: AC
  Administered 2021-02-02: 10 mg via RECTAL
  Filled 2021-02-02: qty 1

## 2021-02-02 MED ORDER — BISACODYL 10 MG RE SUPP
10.0000 mg | Freq: Two times a day (BID) | RECTAL | Status: DC
  Administered 2021-02-02 – 2021-02-03 (×2): 10 mg via RECTAL
  Filled 2021-02-02 (×3): qty 1

## 2021-02-02 MED ORDER — BUPROPION HCL ER (XL) 150 MG PO TB24
300.0000 mg | ORAL_TABLET | Freq: Every day | ORAL | Status: DC
  Administered 2021-02-02 – 2021-02-03 (×2): 300 mg via ORAL
  Filled 2021-02-02 (×2): qty 2

## 2021-02-02 MED ORDER — NALOXONE HCL 0.4 MG/ML IJ SOLN: 0.4000 mg | INTRAMUSCULAR | Status: DC | PRN

## 2021-02-02 MED ORDER — PANTOPRAZOLE SODIUM 40 MG PO TBEC
40.0000 mg | DELAYED_RELEASE_TABLET | Freq: Every day | ORAL | Status: DC
  Administered 2021-02-02 – 2021-02-03 (×2): 40 mg via ORAL
  Filled 2021-02-02 (×2): qty 1

## 2021-02-02 MED ORDER — OXYCODONE HCL 5 MG PO TABS
15.0000 mg | ORAL_TABLET | ORAL | Status: DC | PRN
  Administered 2021-02-02 – 2021-02-03 (×3): 15 mg via ORAL
  Filled 2021-02-02 (×4): qty 3

## 2021-02-02 MED ORDER — NALOXONE HCL 4 MG/0.1ML NA LIQD: 0.4000 mg | Freq: Once | NASAL | Status: DC | PRN

## 2021-02-02 MED ORDER — BUPRENORPHINE HCL-NALOXONE HCL 8-2 MG SL SUBL
1.5000 | SUBLINGUAL_TABLET | Freq: Two times a day (BID) | SUBLINGUAL | Status: DC
  Administered 2021-02-02 – 2021-02-03 (×2): 1.5 via SUBLINGUAL
  Filled 2021-02-02 (×2): qty 2

## 2021-02-02 NOTE — Progress Notes (Addendum)
Progress Note     Subjective: CC: feeling better after NG placement. Passing flatus and denies nausea or emesis. No BM in the past 3-5 days. She states she has difficulty walking due to scoliosis   Objective: Vital signs in last 24 hours: Temp:  [97.8 F (36.6 C)-98.6 F (37 C)] 97.8 F (36.6 C) (07/05 0538) Pulse Rate:  [75-92] 75 (07/05 0538) Resp:  [17-19] 18 (07/05 0538) BP: (124-145)/(63-98) 124/73 (07/05 0538) SpO2:  [95 %-99 %] 95 % (07/05 0538) Last BM Date: 01/30/21  Intake/Output from previous day: 07/04 0701 - 07/05 0700 In: 781.5 [I.V.:691.5; NG/GT:90] Out: 1100 [Emesis/NG output:1100] Intake/Output this shift: No intake/output data recorded.  PE: General: pleasant, WD, female who is laying in bed in NAD HEENT: head is normocephalic, atraumatic.  Mouth is pink and moist Heart: regular, rate, and rhythm.  Palpable radial pulses bilaterally Lungs: Respiratory effort nonlabored Abd: soft, ND, +BS, very mild TTP lower abdomen. NG cannister with bilious output MS: all 4 extremities are symmetrical with no cyanosis, clubbing, or edema. Skin: warm and dry with no masses, lesions, or rashes Psych: A&Ox3 with an appropriate affect.    Lab Results:  Recent Labs    02/01/21 0526 02/02/21 0156  WBC 3.5* 5.3  HGB 14.6 12.6  HCT 46.0 38.2  PLT 269 245   BMET Recent Labs    02/01/21 0526 02/02/21 0156  NA 138 139  K 4.3 3.9  CL 102 103  CO2 27 28  GLUCOSE 111* 91  BUN 10 13  CREATININE 0.71 0.65  CALCIUM 8.8* 8.4*   PT/INR No results for input(s): LABPROT, INR in the last 72 hours. CMP     Component Value Date/Time   NA 139 02/02/2021 0156   NA 145 05/29/2017 0000   K 3.9 02/02/2021 0156   CL 103 02/02/2021 0156   CO2 28 02/02/2021 0156   GLUCOSE 91 02/02/2021 0156   BUN 13 02/02/2021 0156   BUN 23 (A) 05/29/2017 0000   CREATININE 0.65 02/02/2021 0156   CALCIUM 8.4 (L) 02/02/2021 0156   PROT 7.2 01/31/2021 1837   ALBUMIN 4.3 01/31/2021  1837   AST 18 01/31/2021 1837   ALT 18 01/31/2021 1837   ALKPHOS 64 01/31/2021 1837   BILITOT 0.8 01/31/2021 1837   GFRNONAA >60 02/02/2021 0156   GFRAA >60 04/06/2020 0113   Lipase     Component Value Date/Time   LIPASE 22 01/31/2021 1837       Studies/Results: CT Abdomen Pelvis W Contrast  Result Date: 01/31/2021 CLINICAL DATA:  Bowel obstruction suspected. Acute abdominal pain. Nausea and vomiting. History of incarcerated hernia. EXAM: CT ABDOMEN AND PELVIS WITH CONTRAST TECHNIQUE: Multidetector CT imaging of the abdomen and pelvis was performed using the standard protocol following bolus administration of intravenous contrast. CONTRAST:  OMNIPAQUE IOHEXOL 300 MG/ML  SOLN COMPARISON:  CT 03/29/2020 FINDINGS: Lower chest: Small right pleural effusion. Right middle and lower lobe atelectasis. Chronic elevation of right hemidiaphragm. Moderate-sized hiatal hernia. Hepatobiliary: No focal hepatic lesion. Physiologically distended gallbladder. No calcified gallstone or pericholecystic inflammation. Mild common bile duct distension of 8 mm, slightly increased from prior. There is central intrahepatic biliary ductal dilatation. No evidence of choledocholithiasis or obstruction. Pancreas: Parenchymal atrophy. No ductal dilatation or inflammation. Spleen: Normal in size without focal abnormality. Adrenals/Urinary Tract: Adrenal glands are not well visualized, no adrenal nodule. Homogeneous renal enhancement without hydronephrosis. Subcentimeter low-density in the lower right kidney is likely cyst but too small to characterize.  Symmetric renal excretion on delayed phase imaging. Unremarkable urinary bladder. Stomach/Bowel: Moderate hiatal hernia. Mild gastric distension. Dilated fluid-filled pelvic small bowel loops. Fecalization of small bowel contents in the pelvis. Small bowel loops adjacent to enteric sutures in the left pelvis are patulous with fecalized contents. This is likely site of  small-bowel transition, with distal small bowel loops decompressed. No bowel pneumatosis or inflammation. Cecum is located in the deep pelvis. Moderate colonic stool. Normal appendix. Colonic redundancy with moderate volume of formed stool. Vascular/Lymphatic: Normal caliber abdominal aorta. Mild atherosclerosis. Patent portal vein. No portal venous or mesenteric gas. No bulky abdominopelvic adenopathy. Reproductive: Atrophic uterus is tentatively visualized. There is no adnexal mass. Other: Small amount of free fluid/ascites within right upper quadrant, left pericolic gutter, and pelvis. No focal fluid collection or free air. Prior left inguinal hernia repair. Musculoskeletal: Chronic scoliosis and degenerative change in the spine. There are no acute or suspicious osseous abnormalities. IMPRESSION: 1. Partial small-bowel obstruction with transition point in the left pelvis, likely secondary to adhesions. 2. Mild common bile duct dilatation without abnormal gallbladder distention or inflammation. Recommend correlation with LFTs. If elevated, consider further evaluation with MRCP if to resolution of acute event. 3. Small amount of free fluid/ascites within the abdomen and pelvis. 4. Small right pleural effusion. 5. Moderate hiatal hernia. Aortic Atherosclerosis (ICD10-I70.0). Electronically Signed   By: Narda Rutherford M.D.   On: 01/31/2021 23:58   DG Abd Portable 1V-Small Bowel Obstruction Protocol-initial, 8 hr delay  Result Date: 02/01/2021 CLINICAL DATA:  Small bowel obstruction. EXAM: PORTABLE ABDOMEN - 1 VIEW COMPARISON:  Same day. FINDINGS: The bowel gas pattern is normal. Distal tip of nasogastric tube is seen in expected position of distal stomach. No radio-opaque calculi or other significant radiographic abnormality are seen. IMPRESSION: No abnormal bowel dilatation. Electronically Signed   By: Lupita Raider M.D.   On: 02/01/2021 12:40   DG Abd Portable 1V-Small Bowel Protocol-Position  Verification  Result Date: 02/01/2021 CLINICAL DATA:  NG tube placement EXAM: PORTABLE ABDOMEN - 1 VIEW COMPARISON:  CT 01/31/2021 FINDINGS: An enteric tube has been placed with tip in the upper mid abdomen consistent with location in the body of the stomach. Scattered gas and stool in the colon. Visualized bowel gas pattern is normal. Thoracolumbar scoliosis convex towards the left. IMPRESSION: Enteric tube tip is in the upper abdomen consistent with location in the upper stomach. Electronically Signed   By: Burman Nieves M.D.   On: 02/01/2021 04:05    Anti-infectives: Anti-infectives (From admission, onward)    None        Assessment/Plan 65 year old female with SBO Chronic pain - h/o open L IHR with incarceration and SBR 08/21 Dr. Janee Morn - 8 hour delay xray abd without bowel dilatation. Symptoms improved.  - clamp NG and start on clears. Will remove NG if tolerates. ADAT to soft - imaging with significant stool burden - she has not received dulcolax suppository yet. I have ordered for this am. If she tolerates diet could also consider miralax  FEN: clears, ADAT, NG clamped ID: none VTE: SCDs, Okay for chemical prophylaxis from our perspective  Per [facility]: MDD Chronic pain   LOS: 1 day    Eric Form, [facility] 02/02/2021, 9:11 AM Please see Amion for pager number during day hours 7:00am-4:30pm
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
## 2021-02-02 NOTE — Progress Notes (Signed)
Patients NGT clamped per order. 

## 2021-02-03 ENCOUNTER — Ambulatory Visit (HOSPITAL_BASED_OUTPATIENT_CLINIC_OR_DEPARTMENT_OTHER): Payer: Medicare Other | Admitting: Physical Therapy

## 2021-02-03 LAB — BASIC METABOLIC PANEL
Anion gap: 5 (ref 5–15)
BUN: 11 mg/dL (ref 8–23)
CO2: 31 mmol/L (ref 22–32)
Calcium: 8.6 mg/dL — ABNORMAL LOW (ref 8.9–10.3)
Chloride: 104 mmol/L (ref 98–111)
Creatinine, Ser: 0.8 mg/dL (ref 0.44–1.00)
GFR, Estimated: >60 mL/min (ref >60)
Glucose, Bld: 107 mg/dL — ABNORMAL HIGH (ref 70–99)
Potassium: 3.7 mmol/L (ref 3.5–5.1)
Sodium: 140 mmol/L (ref 135–145)

## 2021-02-03 LAB — CBC
HCT: 39.3 % (ref 36.0–46.0)
Hemoglobin: 12.4 g/dL (ref 12.0–15.0)
MCH: 29.1 pg (ref 26.0–34.0)
MCHC: 31.6 g/dL (ref 30.0–36.0)
MCV: 92.3 fL (ref 80.0–100.0)
Platelets: 250 10*3/uL (ref 150–400)
RBC: 4.26 MIL/uL (ref 3.87–5.11)
RDW: 13.4 % (ref 11.5–15.5)
WBC: 5.6 10*3/uL (ref 4.0–10.5)
nRBC: 0 % (ref 0.0–0.2)

## 2021-02-03 MED ORDER — POLYETHYLENE GLYCOL 3350 17 G PO PACK
17.0000 g | PACK | Freq: Every day | ORAL | Status: DC
  Administered 2021-02-03: 17 g via ORAL
  Filled 2021-02-03: qty 1

## 2021-02-03 MED ORDER — DOCUSATE SODIUM 100 MG PO CAPS: 100.0000 mg | ORAL_CAPSULE | Freq: Two times a day (BID) | ORAL | 0 refills | Status: DC

## 2021-02-03 MED ORDER — SENNOSIDES-DOCUSATE SODIUM 8.6-50 MG PO TABS
1.0000 | ORAL_TABLET | Freq: Two times a day (BID) | ORAL | Status: DC
  Administered 2021-02-03: 1 via ORAL
  Filled 2021-02-03: qty 1

## 2021-02-03 MED ORDER — HYDROCODONE-ACETAMINOPHEN 5-325 MG PO TABS: 1.0000 | ORAL_TABLET | Freq: Two times a day (BID) | ORAL | 0 refills | Status: AC | PRN

## 2021-02-03 MED ORDER — AMPHETAMINE-DEXTROAMPHETAMINE 10 MG PO TABS
30.0000 mg | ORAL_TABLET | Freq: Two times a day (BID) | ORAL | Status: DC
  Administered 2021-02-03: 30 mg via ORAL
  Filled 2021-02-03: qty 3

## 2021-02-03 NOTE — Plan of Care (Signed)

## 2021-02-03 NOTE — Progress Notes (Signed)
Progress Note     Subjective: CC: NG out yesterday and she tolerated fulls without nausea, emesis, or abdominal pain. Trying soft diet this am. She was able to walk this morning and has had some small bowel movements. She asks me repeatedly about pain medications and a social work consult.  Objective: Vital signs in last 24 hours: Temp:  [97.6 F (36.4 C)-98.6 F (37 C)] 97.6 F (36.4 C) (07/06 0517) Pulse Rate:  [63-73] 63 (07/06 0517) Resp:  [16-17] 16 (07/06 0517) BP: (105-114)/(70-77) 114/72 (07/06 0517) SpO2:  [95 %-96 %] 95 % (07/06 0517) Last BM Date: 01/30/21  Intake/Output from previous day: 07/05 0701 - 07/06 0700 In: 540 [P.O.:540] Out: -  Intake/Output this shift: No intake/output data recorded.  PE: General: pleasant, WD, female who is laying in bed in NAD HEENT: head is normocephalic, atraumatic.  Mouth is pink and moist Heart: regular, rate, and rhythm.  Palpable radial pulses bilaterally Lungs: Respiratory effort nonlabored Abd: soft, ND, +BS, very mild TTP lower abdomen.  MS: all 4 extremities are symmetrical with no cyanosis, clubbing, or edema. Skin: warm and dry with no masses, lesions, or rashes Psych: A&Ox3 with an appropriate affect.    Lab Results:  Recent Labs    02/02/21 0156 02/03/21 0026  WBC 5.3 5.6  HGB 12.6 12.4  HCT 38.2 39.3  PLT 245 250    BMET Recent Labs    02/02/21 0156 02/03/21 0026  NA 139 140  K 3.9 3.7  CL 103 104  CO2 28 31  GLUCOSE 91 107*  BUN 13 11  CREATININE 0.65 0.80  CALCIUM 8.4* 8.6*    PT/INR No results for input(s): LABPROT, INR in the last 72 hours. CMP     Component Value Date/Time   NA 140 02/03/2021 0026   NA 145 05/29/2017 0000   K 3.7 02/03/2021 0026   CL 104 02/03/2021 0026   CO2 31 02/03/2021 0026   GLUCOSE 107 (H) 02/03/2021 0026   BUN 11 02/03/2021 0026   BUN 23 (A) 05/29/2017 0000   CREATININE 0.80 02/03/2021 0026   CALCIUM 8.6 (L) 02/03/2021 0026   PROT 7.2 01/31/2021 1837    ALBUMIN 4.3 01/31/2021 1837   AST 18 01/31/2021 1837   ALT 18 01/31/2021 1837   ALKPHOS 64 01/31/2021 1837   BILITOT 0.8 01/31/2021 1837   GFRNONAA >60 02/03/2021 0026   GFRAA >60 04/06/2020 0113   Lipase     Component Value Date/Time   LIPASE 22 01/31/2021 1837       Studies/Results: DG Abd Portable 1V-Small Bowel Obstruction Protocol-initial, 8 hr delay  Result Date: 02/01/2021 CLINICAL DATA:  Small bowel obstruction. EXAM: PORTABLE ABDOMEN - 1 VIEW COMPARISON:  Same day. FINDINGS: The bowel gas pattern is normal. Distal tip of nasogastric tube is seen in expected position of distal stomach. No radio-opaque calculi or other significant radiographic abnormality are seen. IMPRESSION: No abnormal bowel dilatation. Electronically Signed   By: Lupita Raider M.D.   On: 02/01/2021 12:40    Anti-infectives: Anti-infectives (From admission, onward)    None        Assessment/Plan 65 year old female with SBO Chronic pain - h/o open L IHR with incarceration and SBR 08/21 Dr. Janee Morn - imaging with significant stool burden - has had a few small Bms with dulcolax.  - NG out yesterday and tolerating diet. Soft today  - if she tolerates soft diet this am she is stable for discharge from surgical perspective  FEN: soft ID: none VTE: SCDs  Per [facility]: MDD Chronic pain   LOS: 2 days    Eric Form, [facility] 02/03/2021, 7:37 AM Please see Amion for pager number during day hours 7:00am-4:30pm
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
## 2021-02-04 ENCOUNTER — Ambulatory Visit (HOSPITAL_BASED_OUTPATIENT_CLINIC_OR_DEPARTMENT_OTHER): Payer: Medicare Other | Admitting: Physical Therapy
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
## 2021-02-08 ENCOUNTER — Other Ambulatory Visit: Payer: Self-pay

## 2021-02-08 ENCOUNTER — Ambulatory Visit (HOSPITAL_BASED_OUTPATIENT_CLINIC_OR_DEPARTMENT_OTHER): Payer: Medicare Other | Attending: Neurosurgery | Admitting: Physical Therapy

## 2021-02-08 ENCOUNTER — Encounter (HOSPITAL_BASED_OUTPATIENT_CLINIC_OR_DEPARTMENT_OTHER): Payer: Self-pay | Admitting: Physical Therapy

## 2021-02-08 DIAGNOSIS — R2689 Other abnormalities of gait and mobility: Secondary | ICD-10-CM

## 2021-02-08 DIAGNOSIS — R293 Abnormal posture: Secondary | ICD-10-CM

## 2021-02-08 DIAGNOSIS — M545 Low back pain, unspecified: Secondary | ICD-10-CM | POA: Diagnosis not present

## 2021-02-08 DIAGNOSIS — G8929 Other chronic pain: Secondary | ICD-10-CM | POA: Diagnosis present

## 2021-02-08 DIAGNOSIS — M546 Pain in thoracic spine: Secondary | ICD-10-CM | POA: Diagnosis present

## 2021-02-09 ENCOUNTER — Encounter (HOSPITAL_BASED_OUTPATIENT_CLINIC_OR_DEPARTMENT_OTHER): Payer: Self-pay | Admitting: Physical Therapy
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
## 2021-02-10 ENCOUNTER — Ambulatory Visit (HOSPITAL_BASED_OUTPATIENT_CLINIC_OR_DEPARTMENT_OTHER): Payer: Medicare Other | Admitting: Physical Therapy

## 2021-02-12 ENCOUNTER — Ambulatory Visit (HOSPITAL_BASED_OUTPATIENT_CLINIC_OR_DEPARTMENT_OTHER): Payer: Medicare Other | Admitting: Physical Therapy

## 2021-02-12 ENCOUNTER — Encounter (HOSPITAL_BASED_OUTPATIENT_CLINIC_OR_DEPARTMENT_OTHER): Payer: Self-pay | Admitting: Physical Therapy

## 2021-02-12 ENCOUNTER — Other Ambulatory Visit: Payer: Self-pay

## 2021-02-12 DIAGNOSIS — R293 Abnormal posture: Secondary | ICD-10-CM

## 2021-02-12 DIAGNOSIS — M546 Pain in thoracic spine: Secondary | ICD-10-CM

## 2021-02-12 DIAGNOSIS — M545 Low back pain, unspecified: Secondary | ICD-10-CM

## 2021-02-12 DIAGNOSIS — G8929 Other chronic pain: Secondary | ICD-10-CM

## 2021-02-12 DIAGNOSIS — R2689 Other abnormalities of gait and mobility: Secondary | ICD-10-CM
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
## 2021-02-15 ENCOUNTER — Other Ambulatory Visit: Payer: Self-pay

## 2021-02-15 ENCOUNTER — Ambulatory Visit (HOSPITAL_BASED_OUTPATIENT_CLINIC_OR_DEPARTMENT_OTHER): Payer: Medicare Other | Admitting: Physical Therapy

## 2021-02-15 ENCOUNTER — Encounter (HOSPITAL_BASED_OUTPATIENT_CLINIC_OR_DEPARTMENT_OTHER): Payer: Self-pay | Admitting: Physical Therapy

## 2021-02-15 DIAGNOSIS — M546 Pain in thoracic spine: Secondary | ICD-10-CM

## 2021-02-15 DIAGNOSIS — G8929 Other chronic pain: Secondary | ICD-10-CM

## 2021-02-15 DIAGNOSIS — R2689 Other abnormalities of gait and mobility: Secondary | ICD-10-CM

## 2021-02-15 DIAGNOSIS — R293 Abnormal posture: Secondary | ICD-10-CM

## 2021-02-15 DIAGNOSIS — M545 Low back pain, unspecified: Secondary | ICD-10-CM

## 2021-02-16 ENCOUNTER — Encounter (HOSPITAL_BASED_OUTPATIENT_CLINIC_OR_DEPARTMENT_OTHER): Payer: Self-pay | Admitting: Physical Therapy
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
## 2021-02-17 ENCOUNTER — Ambulatory Visit (HOSPITAL_BASED_OUTPATIENT_CLINIC_OR_DEPARTMENT_OTHER): Payer: Medicare Other | Admitting: Physical Therapy

## 2021-02-18 ENCOUNTER — Ambulatory Visit (HOSPITAL_BASED_OUTPATIENT_CLINIC_OR_DEPARTMENT_OTHER): Payer: Medicare Other | Admitting: Physical Therapy

## 2021-02-19 ENCOUNTER — Ambulatory Visit: Payer: Medicare Other | Admitting: Physician Assistant

## 2021-02-19 ENCOUNTER — Ambulatory Visit (HOSPITAL_BASED_OUTPATIENT_CLINIC_OR_DEPARTMENT_OTHER): Payer: Medicare Other | Admitting: Physical Therapy

## 2021-02-19 ENCOUNTER — Other Ambulatory Visit: Payer: Self-pay

## 2021-02-19 ENCOUNTER — Encounter (HOSPITAL_BASED_OUTPATIENT_CLINIC_OR_DEPARTMENT_OTHER): Payer: Self-pay | Admitting: Physical Therapy

## 2021-02-19 DIAGNOSIS — M545 Low back pain, unspecified: Secondary | ICD-10-CM | POA: Diagnosis not present

## 2021-02-19 DIAGNOSIS — R2689 Other abnormalities of gait and mobility: Secondary | ICD-10-CM

## 2021-02-19 DIAGNOSIS — G8929 Other chronic pain: Secondary | ICD-10-CM

## 2021-02-19 DIAGNOSIS — R293 Abnormal posture: Secondary | ICD-10-CM

## 2021-02-19 DIAGNOSIS — M546 Pain in thoracic spine: Secondary | ICD-10-CM
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
## 2021-02-22 ENCOUNTER — Encounter (HOSPITAL_BASED_OUTPATIENT_CLINIC_OR_DEPARTMENT_OTHER): Payer: Self-pay | Admitting: Physical Therapy

## 2021-02-22 ENCOUNTER — Other Ambulatory Visit: Payer: Self-pay

## 2021-02-22 ENCOUNTER — Ambulatory Visit (HOSPITAL_BASED_OUTPATIENT_CLINIC_OR_DEPARTMENT_OTHER): Payer: Medicare Other | Admitting: Physical Therapy

## 2021-02-22 DIAGNOSIS — R293 Abnormal posture: Secondary | ICD-10-CM

## 2021-02-22 DIAGNOSIS — M546 Pain in thoracic spine: Secondary | ICD-10-CM

## 2021-02-22 DIAGNOSIS — M545 Low back pain, unspecified: Secondary | ICD-10-CM | POA: Diagnosis not present

## 2021-02-22 DIAGNOSIS — G8929 Other chronic pain: Secondary | ICD-10-CM

## 2021-02-22 DIAGNOSIS — R2689 Other abnormalities of gait and mobility: Secondary | ICD-10-CM
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
## 2021-02-24 ENCOUNTER — Ambulatory Visit (HOSPITAL_BASED_OUTPATIENT_CLINIC_OR_DEPARTMENT_OTHER): Payer: Medicare Other | Admitting: Physical Therapy

## 2021-02-25 ENCOUNTER — Ambulatory Visit (HOSPITAL_BASED_OUTPATIENT_CLINIC_OR_DEPARTMENT_OTHER): Payer: Medicare Other | Admitting: Physical Therapy

## 2021-02-26 ENCOUNTER — Other Ambulatory Visit: Payer: Self-pay

## 2021-02-26 ENCOUNTER — Encounter (HOSPITAL_BASED_OUTPATIENT_CLINIC_OR_DEPARTMENT_OTHER): Payer: Self-pay | Admitting: Physical Therapy

## 2021-02-26 ENCOUNTER — Ambulatory Visit (HOSPITAL_BASED_OUTPATIENT_CLINIC_OR_DEPARTMENT_OTHER): Payer: Medicare Other | Admitting: Physical Therapy

## 2021-02-26 DIAGNOSIS — R2689 Other abnormalities of gait and mobility: Secondary | ICD-10-CM

## 2021-02-26 DIAGNOSIS — M545 Low back pain, unspecified: Secondary | ICD-10-CM

## 2021-02-26 DIAGNOSIS — G8929 Other chronic pain: Secondary | ICD-10-CM

## 2021-02-26 DIAGNOSIS — R293 Abnormal posture: Secondary | ICD-10-CM

## 2021-02-26 DIAGNOSIS — M546 Pain in thoracic spine: Secondary | ICD-10-CM
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
## 2021-03-03 ENCOUNTER — Ambulatory Visit (INDEPENDENT_AMBULATORY_CARE_PROVIDER_SITE_OTHER): Payer: Medicare Other | Admitting: Physician Assistant

## 2021-03-03 ENCOUNTER — Other Ambulatory Visit: Payer: Self-pay

## 2021-03-03 DIAGNOSIS — M2021 Hallux rigidus, right foot: Secondary | ICD-10-CM
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
## 2021-03-09 ENCOUNTER — Ambulatory Visit (INDEPENDENT_AMBULATORY_CARE_PROVIDER_SITE_OTHER): Payer: Medicare Other | Admitting: Family Medicine

## 2021-03-09 ENCOUNTER — Other Ambulatory Visit: Payer: Self-pay

## 2021-03-09 ENCOUNTER — Encounter: Payer: Self-pay | Admitting: Family Medicine

## 2021-03-09 VITALS — BP 132/84 | HR 67 | Temp 97.1°F | Ht 63.0 in | Wt 112.8 lb

## 2021-03-09 DIAGNOSIS — F332 Major depressive disorder, recurrent severe without psychotic features: Secondary | ICD-10-CM

## 2021-03-09 DIAGNOSIS — T07XXXA Unspecified multiple injuries, initial encounter: Secondary | ICD-10-CM

## 2021-03-09 DIAGNOSIS — K219 Gastro-esophageal reflux disease without esophagitis: Secondary | ICD-10-CM | POA: Diagnosis not present
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
## 2021-03-12 ENCOUNTER — Ambulatory Visit (HOSPITAL_BASED_OUTPATIENT_CLINIC_OR_DEPARTMENT_OTHER): Payer: Medicare Other | Admitting: Physical Therapy

## 2021-03-15 ENCOUNTER — Ambulatory Visit (HOSPITAL_BASED_OUTPATIENT_CLINIC_OR_DEPARTMENT_OTHER): Payer: Medicare Other | Admitting: Physical Therapy

## 2021-03-17 ENCOUNTER — Ambulatory Visit (HOSPITAL_BASED_OUTPATIENT_CLINIC_OR_DEPARTMENT_OTHER): Payer: Medicare Other | Admitting: Physical Therapy

## 2021-03-23 ENCOUNTER — Encounter (HOSPITAL_BASED_OUTPATIENT_CLINIC_OR_DEPARTMENT_OTHER): Payer: Medicare Other | Admitting: Physical Therapy

## 2021-03-25 ENCOUNTER — Ambulatory Visit (HOSPITAL_BASED_OUTPATIENT_CLINIC_OR_DEPARTMENT_OTHER): Payer: Medicare Other | Attending: Neurosurgery | Admitting: Physical Therapy

## 2021-03-25 ENCOUNTER — Other Ambulatory Visit: Payer: Self-pay

## 2021-03-25 DIAGNOSIS — M546 Pain in thoracic spine: Secondary | ICD-10-CM | POA: Insufficient documentation

## 2021-03-25 DIAGNOSIS — R293 Abnormal posture: Secondary | ICD-10-CM | POA: Diagnosis present

## 2021-03-25 DIAGNOSIS — M545 Low back pain, unspecified: Secondary | ICD-10-CM | POA: Diagnosis present

## 2021-03-25 DIAGNOSIS — R2689 Other abnormalities of gait and mobility: Secondary | ICD-10-CM | POA: Insufficient documentation

## 2021-03-25 DIAGNOSIS — G8929 Other chronic pain: Secondary | ICD-10-CM | POA: Diagnosis present
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
## 2021-03-29 ENCOUNTER — Ambulatory Visit (INDEPENDENT_AMBULATORY_CARE_PROVIDER_SITE_OTHER): Payer: Medicare Other | Admitting: Orthopedic Surgery

## 2021-03-29 ENCOUNTER — Ambulatory Visit: Payer: Medicare Other | Admitting: Orthopedic Surgery

## 2021-03-29 ENCOUNTER — Other Ambulatory Visit: Payer: Self-pay

## 2021-03-29 ENCOUNTER — Ambulatory Visit (HOSPITAL_BASED_OUTPATIENT_CLINIC_OR_DEPARTMENT_OTHER): Payer: Medicare Other | Admitting: Physical Therapy

## 2021-03-29 DIAGNOSIS — M2021 Hallux rigidus, right foot: Secondary | ICD-10-CM

## 2021-03-30 ENCOUNTER — Encounter: Payer: Self-pay | Admitting: Orthopedic Surgery
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
## 2021-04-01 ENCOUNTER — Ambulatory Visit (HOSPITAL_BASED_OUTPATIENT_CLINIC_OR_DEPARTMENT_OTHER): Payer: Medicare Other | Admitting: Physical Therapy

## 2021-04-01 ENCOUNTER — Other Ambulatory Visit: Payer: Self-pay

## 2021-04-01 ENCOUNTER — Ambulatory Visit (HOSPITAL_BASED_OUTPATIENT_CLINIC_OR_DEPARTMENT_OTHER): Payer: Medicare Other | Attending: Neurosurgery | Admitting: Physical Therapy

## 2021-04-01 DIAGNOSIS — G8929 Other chronic pain: Secondary | ICD-10-CM | POA: Diagnosis present

## 2021-04-01 DIAGNOSIS — R2689 Other abnormalities of gait and mobility: Secondary | ICD-10-CM | POA: Insufficient documentation

## 2021-04-01 DIAGNOSIS — R293 Abnormal posture: Secondary | ICD-10-CM | POA: Insufficient documentation

## 2021-04-01 DIAGNOSIS — M546 Pain in thoracic spine: Secondary | ICD-10-CM | POA: Diagnosis present

## 2021-04-01 DIAGNOSIS — M545 Low back pain, unspecified: Secondary | ICD-10-CM | POA: Diagnosis present
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
## 2021-04-07 ENCOUNTER — Encounter (HOSPITAL_BASED_OUTPATIENT_CLINIC_OR_DEPARTMENT_OTHER): Payer: Medicare Other | Admitting: Physical Therapy

## 2021-04-15 ENCOUNTER — Other Ambulatory Visit: Payer: Self-pay

## 2021-04-15 ENCOUNTER — Ambulatory Visit (HOSPITAL_BASED_OUTPATIENT_CLINIC_OR_DEPARTMENT_OTHER): Payer: Medicare Other | Admitting: Physical Therapy

## 2021-04-15 DIAGNOSIS — R293 Abnormal posture: Secondary | ICD-10-CM

## 2021-04-15 DIAGNOSIS — M546 Pain in thoracic spine: Secondary | ICD-10-CM

## 2021-04-15 DIAGNOSIS — R2689 Other abnormalities of gait and mobility: Secondary | ICD-10-CM

## 2021-04-15 DIAGNOSIS — G8929 Other chronic pain: Secondary | ICD-10-CM

## 2021-04-16 ENCOUNTER — Encounter (HOSPITAL_BASED_OUTPATIENT_CLINIC_OR_DEPARTMENT_OTHER): Payer: Self-pay | Admitting: Physical Therapy
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
## 2021-04-20 ENCOUNTER — Other Ambulatory Visit: Payer: Self-pay

## 2021-04-28 ENCOUNTER — Other Ambulatory Visit: Payer: Self-pay

## 2021-04-28 ENCOUNTER — Encounter (HOSPITAL_BASED_OUTPATIENT_CLINIC_OR_DEPARTMENT_OTHER): Payer: Self-pay | Admitting: Physical Therapy

## 2021-04-28 ENCOUNTER — Ambulatory Visit (HOSPITAL_BASED_OUTPATIENT_CLINIC_OR_DEPARTMENT_OTHER): Payer: Medicare Other | Admitting: Physical Therapy

## 2021-04-28 DIAGNOSIS — G8929 Other chronic pain: Secondary | ICD-10-CM

## 2021-04-28 DIAGNOSIS — R293 Abnormal posture: Secondary | ICD-10-CM

## 2021-04-28 DIAGNOSIS — M546 Pain in thoracic spine: Secondary | ICD-10-CM

## 2021-04-28 DIAGNOSIS — R2689 Other abnormalities of gait and mobility: Secondary | ICD-10-CM

## 2021-04-28 DIAGNOSIS — M545 Low back pain, unspecified: Secondary | ICD-10-CM | POA: Diagnosis not present

## 2021-04-29 ENCOUNTER — Encounter (HOSPITAL_BASED_OUTPATIENT_CLINIC_OR_DEPARTMENT_OTHER): Payer: Self-pay | Admitting: Physical Therapy
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
## 2021-05-07 ENCOUNTER — Other Ambulatory Visit: Payer: Self-pay

## 2021-05-07 ENCOUNTER — Ambulatory Visit (HOSPITAL_BASED_OUTPATIENT_CLINIC_OR_DEPARTMENT_OTHER): Payer: Medicare Other | Attending: Neurosurgery | Admitting: Physical Therapy

## 2021-05-07 DIAGNOSIS — M545 Low back pain, unspecified: Secondary | ICD-10-CM | POA: Insufficient documentation

## 2021-05-07 DIAGNOSIS — G8929 Other chronic pain: Secondary | ICD-10-CM | POA: Insufficient documentation

## 2021-05-07 DIAGNOSIS — M546 Pain in thoracic spine: Secondary | ICD-10-CM | POA: Insufficient documentation

## 2021-05-07 DIAGNOSIS — R2689 Other abnormalities of gait and mobility: Secondary | ICD-10-CM | POA: Insufficient documentation

## 2021-05-07 DIAGNOSIS — R293 Abnormal posture: Secondary | ICD-10-CM | POA: Diagnosis present

## 2021-05-09 ENCOUNTER — Encounter (HOSPITAL_BASED_OUTPATIENT_CLINIC_OR_DEPARTMENT_OTHER): Payer: Self-pay | Admitting: Physical Therapy
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
## 2021-05-11 ENCOUNTER — Other Ambulatory Visit: Payer: Self-pay

## 2021-05-11 ENCOUNTER — Encounter (HOSPITAL_BASED_OUTPATIENT_CLINIC_OR_DEPARTMENT_OTHER): Payer: Self-pay | Admitting: Orthopedic Surgery

## 2021-05-12 ENCOUNTER — Other Ambulatory Visit: Payer: Self-pay | Admitting: Family

## 2021-05-14 ENCOUNTER — Other Ambulatory Visit: Payer: Self-pay

## 2021-05-14 ENCOUNTER — Encounter (HOSPITAL_BASED_OUTPATIENT_CLINIC_OR_DEPARTMENT_OTHER): Payer: Self-pay | Admitting: Physical Therapy

## 2021-05-14 ENCOUNTER — Ambulatory Visit (HOSPITAL_BASED_OUTPATIENT_CLINIC_OR_DEPARTMENT_OTHER): Payer: Medicare Other | Admitting: Physical Therapy

## 2021-05-14 DIAGNOSIS — G8929 Other chronic pain: Secondary | ICD-10-CM

## 2021-05-14 DIAGNOSIS — R2689 Other abnormalities of gait and mobility: Secondary | ICD-10-CM

## 2021-05-14 DIAGNOSIS — M546 Pain in thoracic spine: Secondary | ICD-10-CM | POA: Diagnosis not present

## 2021-05-14 DIAGNOSIS — R293 Abnormal posture: Secondary | ICD-10-CM
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
## 2021-05-18 ENCOUNTER — Ambulatory Visit (HOSPITAL_BASED_OUTPATIENT_CLINIC_OR_DEPARTMENT_OTHER): Payer: Medicare Other

## 2021-05-18 ENCOUNTER — Ambulatory Visit (HOSPITAL_BASED_OUTPATIENT_CLINIC_OR_DEPARTMENT_OTHER)
Admission: RE | Admit: 2021-05-18 | Discharge: 2021-05-18 | Disposition: A | Discharge status: Home / Self Care | Payer: Medicare Other | Attending: Orthopedic Surgery | Admitting: Orthopedic Surgery

## 2021-05-18 ENCOUNTER — Encounter (HOSPITAL_BASED_OUTPATIENT_CLINIC_OR_DEPARTMENT_OTHER)
Admission: RE | Disposition: A | Discharge status: Home / Self Care | Payer: Self-pay | Source: Home / Self Care | Attending: Orthopedic Surgery

## 2021-05-18 ENCOUNTER — Ambulatory Visit (HOSPITAL_COMMUNITY): Payer: Medicare Other | Admitting: Certified Registered"

## 2021-05-18 ENCOUNTER — Encounter (HOSPITAL_BASED_OUTPATIENT_CLINIC_OR_DEPARTMENT_OTHER): Payer: Self-pay | Admitting: Orthopedic Surgery

## 2021-05-18 DIAGNOSIS — Z538 Procedure and treatment not carried out for other reasons: Secondary | ICD-10-CM | POA: Insufficient documentation

## 2021-05-18 DIAGNOSIS — Y798 Miscellaneous orthopedic devices associated with adverse incidents, not elsewhere classified: Secondary | ICD-10-CM | POA: Insufficient documentation

## 2021-05-18 DIAGNOSIS — T8484XA Pain due to internal orthopedic prosthetic devices, implants and grafts, initial encounter: Secondary | ICD-10-CM | POA: Insufficient documentation

## 2021-05-18 SURGERY — FUSION, JOINT, GREAT TOE
Anesthesia: General | Site: Toe | Laterality: Right

## 2021-05-18 MED ORDER — PROPOFOL 10 MG/ML IV BOLUS
INTRAVENOUS | Status: AC
  Filled 2021-05-18: qty 20

## 2021-05-18 MED ORDER — LIDOCAINE 2% (20 MG/ML) 5 ML SYRINGE
INTRAMUSCULAR | Status: AC
  Filled 2021-05-18: qty 5

## 2021-05-18 MED ORDER — TRANEXAMIC ACID-NACL 1000-0.7 MG/100ML-% IV SOLN
INTRAVENOUS | Status: AC
  Filled 2021-05-18: qty 100

## 2021-05-18 MED ORDER — FENTANYL CITRATE (PF) 100 MCG/2ML IJ SOLN
INTRAMUSCULAR | Status: AC
  Filled 2021-05-18: qty 2

## 2021-05-18 MED ORDER — LACTATED RINGERS IV SOLN: INTRAVENOUS | Status: DC

## 2021-05-18 MED ORDER — DEXAMETHASONE SODIUM PHOSPHATE 10 MG/ML IJ SOLN
INTRAMUSCULAR | Status: AC
  Filled 2021-05-18: qty 1

## 2021-05-18 MED ORDER — MIDAZOLAM HCL 2 MG/2ML IJ SOLN
INTRAMUSCULAR | Status: AC
  Filled 2021-05-18: qty 2

## 2021-05-18 MED ORDER — ONDANSETRON HCL 4 MG/2ML IJ SOLN
INTRAMUSCULAR | Status: AC
  Filled 2021-05-18: qty 2

## 2021-05-18 MED ORDER — TRANEXAMIC ACID-NACL 1000-0.7 MG/100ML-% IV SOLN: 1000.0000 mg | INTRAVENOUS | Status: DC

## 2021-05-18 MED ORDER — CEFAZOLIN SODIUM-DEXTROSE 2-4 GM/100ML-% IV SOLN: 2.0000 g | INTRAVENOUS | Status: DC

## 2021-05-18 MED ORDER — CEFAZOLIN SODIUM-DEXTROSE 2-4 GM/100ML-% IV SOLN
INTRAVENOUS | Status: AC
  Filled 2021-05-18: qty 100

## 2021-05-18 NOTE — Anesthesia Preprocedure Evaluation (Deleted)
Anesthesia Evaluation  Patient identified by MRN, date of birth, ID band Patient awake    Reviewed: Allergy & Precautions, H&P , NPO status , Patient's Chart, lab work & pertinent test results  Airway Mallampati: II  TM Distance: >3 FB Neck ROM: Full    Dental no notable dental hx. (+) Dental Advisory Given, Teeth Intact   Pulmonary Current Smoker and Patient abstained from smoking.,    Pulmonary exam normal breath sounds clear to auscultation       Cardiovascular negative cardio ROS   Rhythm:Regular Rate:Normal     Neuro/Psych Depression negative neurological ROS     GI/Hepatic Neg liver ROS, GERD  ,  Endo/Other  negative endocrine ROS  Renal/GU negative Renal ROS  negative genitourinary   Musculoskeletal   Abdominal   Peds  Hematology  (+) Blood dyscrasia, anemia ,   Anesthesia Other Findings   Reproductive/Obstetrics negative OB ROS                             Anesthesia Physical  Anesthesia Plan  ASA: II  Anesthesia Plan: General   Post-op Pain Management:  Regional for Post-op pain   Induction: Intravenous  PONV Risk Score and Plan: 2 and Ondansetron, Midazolam and Treatment may vary due to age or medical condition  Airway Management Planned: LMA  Additional Equipment:   Intra-op Plan:   Post-operative Plan: Extubation in OR  Informed Consent: I have reviewed the patients History and Physical, chart, labs and discussed the procedure including the risks, benefits and alternatives for the proposed anesthesia with the patient or authorized representative who has indicated his/her understanding and acceptance.     Dental advisory given  Plan Discussed with: CRNA  Anesthesia Plan Comments:         Anesthesia Quick Evaluation

## 2021-05-18 NOTE — Progress Notes (Signed)
During patient pre op interview the pt was falling asleep while trying to answer questions. The NT who brought her back also noted the pt starting to doze off when trying to change clothes. The NT offered to help her because she was concerned of the pt falling but the pt refused so we stood within arms reach of pt so she could change.   When I  was questioning the pt about when she last ate and drank she first replied she had a few bites of chicken "last night between noon and 2." Wanting to verify between am and pm I asked her "So you mean between noon and 2pm yesterday afternoon?" Pt replied, "No, last night." I took this information the anesthesiologist and the surgeon and her surgeon made the ultimate decision to cancel the surgery for today and have the pt reschedule.
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
## 2021-05-21 ENCOUNTER — Ambulatory Visit (HOSPITAL_BASED_OUTPATIENT_CLINIC_OR_DEPARTMENT_OTHER): Payer: Medicare Other | Admitting: Physical Therapy

## 2021-05-21 ENCOUNTER — Ambulatory Visit (HOSPITAL_BASED_OUTPATIENT_CLINIC_OR_DEPARTMENT_OTHER): Payer: Self-pay | Admitting: Physical Therapy

## 2021-05-21 ENCOUNTER — Other Ambulatory Visit: Payer: Self-pay

## 2021-05-25 ENCOUNTER — Encounter (HOSPITAL_BASED_OUTPATIENT_CLINIC_OR_DEPARTMENT_OTHER): Payer: Self-pay | Admitting: Physical Therapy

## 2021-05-25 ENCOUNTER — Encounter (HOSPITAL_BASED_OUTPATIENT_CLINIC_OR_DEPARTMENT_OTHER): Payer: Medicare Other | Attending: Physical Therapy | Admitting: Physical Therapy

## 2021-05-25 ENCOUNTER — Other Ambulatory Visit: Payer: Self-pay

## 2021-05-25 DIAGNOSIS — M545 Low back pain, unspecified: Secondary | ICD-10-CM | POA: Insufficient documentation

## 2021-05-25 DIAGNOSIS — R2689 Other abnormalities of gait and mobility: Secondary | ICD-10-CM | POA: Insufficient documentation

## 2021-05-25 DIAGNOSIS — R293 Abnormal posture: Secondary | ICD-10-CM | POA: Diagnosis present

## 2021-05-25 DIAGNOSIS — G8929 Other chronic pain: Secondary | ICD-10-CM | POA: Diagnosis present

## 2021-05-25 DIAGNOSIS — M546 Pain in thoracic spine: Secondary | ICD-10-CM | POA: Diagnosis not present

## 2021-05-25 NOTE — Addendum Note (Signed)
Addended by: Dessie Coma on: 05/25/2021 03:32 PM   Modules accepted: Orders

## 2021-05-26 ENCOUNTER — Encounter (HOSPITAL_BASED_OUTPATIENT_CLINIC_OR_DEPARTMENT_OTHER): Payer: Self-pay | Admitting: Physical Therapy
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
## 2021-05-28 ENCOUNTER — Ambulatory Visit (HOSPITAL_BASED_OUTPATIENT_CLINIC_OR_DEPARTMENT_OTHER): Payer: Self-pay | Admitting: Physical Therapy

## 2021-06-02 ENCOUNTER — Encounter (HOSPITAL_BASED_OUTPATIENT_CLINIC_OR_DEPARTMENT_OTHER): Payer: Self-pay | Admitting: Physical Therapy

## 2021-06-04 ENCOUNTER — Ambulatory Visit (HOSPITAL_BASED_OUTPATIENT_CLINIC_OR_DEPARTMENT_OTHER): Payer: Medicare Other | Attending: Neurosurgery | Admitting: Physical Therapy

## 2021-06-04 DIAGNOSIS — R2689 Other abnormalities of gait and mobility: Secondary | ICD-10-CM | POA: Insufficient documentation

## 2021-06-04 DIAGNOSIS — G8929 Other chronic pain: Secondary | ICD-10-CM | POA: Insufficient documentation

## 2021-06-04 DIAGNOSIS — M546 Pain in thoracic spine: Secondary | ICD-10-CM | POA: Insufficient documentation

## 2021-06-04 DIAGNOSIS — R293 Abnormal posture: Secondary | ICD-10-CM | POA: Insufficient documentation

## 2021-06-04 DIAGNOSIS — M545 Low back pain, unspecified: Secondary | ICD-10-CM | POA: Insufficient documentation

## 2021-06-10 ENCOUNTER — Ambulatory Visit: Payer: Medicare Other | Admitting: Psychologist

## 2021-06-11 ENCOUNTER — Ambulatory Visit (HOSPITAL_BASED_OUTPATIENT_CLINIC_OR_DEPARTMENT_OTHER): Payer: Medicare Other | Admitting: Physical Therapy

## 2021-06-18 ENCOUNTER — Ambulatory Visit (HOSPITAL_BASED_OUTPATIENT_CLINIC_OR_DEPARTMENT_OTHER): Payer: Medicare Other | Admitting: Physical Therapy

## 2021-06-22 ENCOUNTER — Ambulatory Visit: Payer: Medicare Other | Admitting: Psychologist

## 2021-06-29 ENCOUNTER — Ambulatory Visit: Payer: Medicare Other | Admitting: Psychologist

## 2021-07-01 ENCOUNTER — Telehealth (HOSPITAL_BASED_OUTPATIENT_CLINIC_OR_DEPARTMENT_OTHER): Payer: Self-pay | Admitting: Physical Therapy

## 2021-07-01 NOTE — Telephone Encounter (Signed)
Therapy called patient regarding scheduling visits. The patient has not attended PT since 10/25 and only 5 appointments over the course of 8 weeks prior, with multiple cancellations for various reasons. At this point we will not be able to justify further visits. She has not made progress and will likely not without consistent treatment. Therapy spoke with the patient. She is aware that she will be discharged at this time 2nd to lack of progress and ability to justify further therapy.

## 2021-07-02 NOTE — Telephone Encounter (Signed)
Opened in error

## 2021-09-28 ENCOUNTER — Ambulatory Visit: Payer: Medicare Other | Admitting: Orthopedic Surgery

## 2021-10-05 ENCOUNTER — Ambulatory Visit (HOSPITAL_BASED_OUTPATIENT_CLINIC_OR_DEPARTMENT_OTHER): Admission: RE | Admit: 2021-10-05 | Payer: Medicare Other | Source: Home / Self Care | Admitting: Orthopedic Surgery

## 2021-10-05 ENCOUNTER — Encounter (HOSPITAL_BASED_OUTPATIENT_CLINIC_OR_DEPARTMENT_OTHER): Admission: RE | Payer: Self-pay | Source: Home / Self Care

## 2021-10-05 SURGERY — FUSION, JOINT, GREAT TOE
Anesthesia: Choice | Site: Toe | Laterality: Right

## 2021-10-21 ENCOUNTER — Ambulatory Visit: Payer: Medicare Other | Admitting: Internal Medicine

## 2021-11-09 ENCOUNTER — Ambulatory Visit (HOSPITAL_BASED_OUTPATIENT_CLINIC_OR_DEPARTMENT_OTHER): Admission: RE | Admit: 2021-11-09 | Payer: Medicare Other | Source: Home / Self Care | Admitting: Orthopedic Surgery

## 2021-11-09 ENCOUNTER — Encounter (HOSPITAL_BASED_OUTPATIENT_CLINIC_OR_DEPARTMENT_OTHER): Admission: RE | Payer: Self-pay | Source: Home / Self Care

## 2021-11-09 SURGERY — FUSION, JOINT, GREAT TOE
Anesthesia: Choice | Site: Toe | Laterality: Right

## 2021-12-02 ENCOUNTER — Ambulatory Visit: Payer: Medicare Other | Admitting: Internal Medicine
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
## 2021-12-03 ENCOUNTER — Ambulatory Visit (HOSPITAL_BASED_OUTPATIENT_CLINIC_OR_DEPARTMENT_OTHER): Payer: Medicare Other | Attending: Physician Assistant | Admitting: Physical Therapy

## 2021-12-03 DIAGNOSIS — M419 Scoliosis, unspecified: Secondary | ICD-10-CM | POA: Diagnosis present

## 2021-12-03 DIAGNOSIS — M5412 Radiculopathy, cervical region: Secondary | ICD-10-CM | POA: Diagnosis not present

## 2021-12-03 DIAGNOSIS — M542 Cervicalgia: Secondary | ICD-10-CM | POA: Insufficient documentation

## 2021-12-03 DIAGNOSIS — G8929 Other chronic pain: Secondary | ICD-10-CM | POA: Diagnosis not present

## 2021-12-03 DIAGNOSIS — M546 Pain in thoracic spine: Secondary | ICD-10-CM | POA: Diagnosis not present

## 2021-12-03 DIAGNOSIS — R2689 Other abnormalities of gait and mobility: Secondary | ICD-10-CM | POA: Insufficient documentation

## 2021-12-03 DIAGNOSIS — R293 Abnormal posture: Secondary | ICD-10-CM | POA: Insufficient documentation

## 2021-12-03 DIAGNOSIS — F444 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: Secondary | ICD-10-CM | POA: Diagnosis not present

## 2021-12-03 DIAGNOSIS — M5459 Other low back pain: Secondary | ICD-10-CM | POA: Diagnosis not present

## 2021-12-03 DIAGNOSIS — M545 Low back pain, unspecified: Secondary | ICD-10-CM | POA: Diagnosis not present
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
## 2021-12-14 ENCOUNTER — Encounter (HOSPITAL_BASED_OUTPATIENT_CLINIC_OR_DEPARTMENT_OTHER): Payer: Self-pay | Admitting: Physical Therapy

## 2021-12-14 ENCOUNTER — Ambulatory Visit (HOSPITAL_BASED_OUTPATIENT_CLINIC_OR_DEPARTMENT_OTHER): Payer: Medicare Other | Admitting: Physical Therapy

## 2021-12-14 DIAGNOSIS — R2689 Other abnormalities of gait and mobility: Secondary | ICD-10-CM

## 2021-12-14 DIAGNOSIS — M5459 Other low back pain: Secondary | ICD-10-CM

## 2021-12-14 DIAGNOSIS — M546 Pain in thoracic spine: Secondary | ICD-10-CM

## 2021-12-14 DIAGNOSIS — R293 Abnormal posture: Secondary | ICD-10-CM

## 2021-12-14 DIAGNOSIS — M419 Scoliosis, unspecified: Secondary | ICD-10-CM | POA: Diagnosis not present
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
## 2021-12-15 ENCOUNTER — Encounter (HOSPITAL_BASED_OUTPATIENT_CLINIC_OR_DEPARTMENT_OTHER): Payer: Self-pay | Admitting: Nurse Practitioner

## 2021-12-15 ENCOUNTER — Ambulatory Visit (INDEPENDENT_AMBULATORY_CARE_PROVIDER_SITE_OTHER): Payer: Medicare Other | Admitting: Nurse Practitioner

## 2021-12-15 VITALS — BP 117/72 | HR 95 | Ht 64.0 in | Wt 107.0 lb

## 2021-12-15 DIAGNOSIS — F332 Major depressive disorder, recurrent severe without psychotic features: Secondary | ICD-10-CM

## 2021-12-15 DIAGNOSIS — L719 Rosacea, unspecified: Secondary | ICD-10-CM

## 2021-12-15 DIAGNOSIS — K219 Gastro-esophageal reflux disease without esophagitis: Secondary | ICD-10-CM

## 2021-12-15 DIAGNOSIS — G894 Chronic pain syndrome: Secondary | ICD-10-CM

## 2021-12-15 DIAGNOSIS — K5902 Outlet dysfunction constipation: Secondary | ICD-10-CM | POA: Insufficient documentation

## 2021-12-15 DIAGNOSIS — F900 Attention-deficit hyperactivity disorder, predominantly inattentive type: Secondary | ICD-10-CM

## 2021-12-15 DIAGNOSIS — R269 Unspecified abnormalities of gait and mobility: Secondary | ICD-10-CM

## 2021-12-15 DIAGNOSIS — F5104 Psychophysiologic insomnia: Secondary | ICD-10-CM

## 2021-12-15 MED ORDER — LINACLOTIDE 145 MCG PO CAPS: 145.0000 ug | ORAL_CAPSULE | Freq: Every day | ORAL | 0 refills | Status: DC

## 2021-12-15 MED ORDER — OMEPRAZOLE 40 MG PO CPDR: 40.0000 mg | DELAYED_RELEASE_CAPSULE | Freq: Every day | ORAL | 3 refills | Status: DC

## 2021-12-15 MED ORDER — AMPHETAMINE-DEXTROAMPHETAMINE 30 MG PO TABS: 30.0000 mg | ORAL_TABLET | Freq: Every day | ORAL | 0 refills | Status: DC

## 2021-12-15 MED ORDER — BUPROPION HCL ER (XL) 300 MG PO TB24: 300.0000 mg | ORAL_TABLET | Freq: Every day | ORAL | Status: DC

## 2021-12-15 MED ORDER — RETIN-A MICRO 0.1 % EX GEL: Freq: Every day | CUTANEOUS | 6 refills | Status: DC

## 2021-12-15 MED ORDER — FAMOTIDINE 40 MG PO TABS: 40.0000 mg | ORAL_TABLET | Freq: Every evening | ORAL | 3 refills | Status: DC | PRN

## 2021-12-15 MED ORDER — QUETIAPINE FUMARATE 25 MG PO TABS: 25.0000 mg | ORAL_TABLET | Freq: Every day | ORAL | 3 refills | Status: DC

## 2021-12-15 MED ORDER — AMPHETAMINE-DEXTROAMPHET ER 30 MG PO CP24: 30.0000 mg | ORAL_CAPSULE | ORAL | 0 refills | Status: DC
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
## 2021-12-15 NOTE — Patient Instructions (Signed)
Thank you for choosing [place] [facility] for your Primary Care needs. I am excited for the opportunity to partner with you to meet your health care goals. It was a pleasure meeting you today! ? ?Recommendations from today's visit: ?I have sent in refills all of your medication to the pharmacy today.  ?Please let me know how you are doing on the new adderall dosage. We can plan a phone call in 2-3 weeks to see if this dose is working well for you and if so we can send in three months at a time.  ?I have sent a referral for counseling services for you- if you don't hear from them in the next week then let me know.  ?We will work to get your records and see when you are due for your physical and labs and we can plan to keep these up to date ? ?Information on diet, exercise, and health maintenance recommendations are listed below. This is information to help you be sure you are on track for optimal health and monitoring.  ? ?Please look over this and let Deborah [name] know if you have any questions or if you have completed any of the health maintenance outside of [place] so that we can be sure your records are up to date.  ?___________________________________________________________ ?About Me: ?I am an Adult-Geriatric Nurse Practitioner with a background in caring for patients for more than 20 years with a strong intensive care background. I provide primary care and sports medicine services to patients age 57 and older within this office. My education had a strong focus on caring for the older adult population, which I am passionate about. I am also the director of the APP Fellowship with [facility].  ? ?My desire is to provide you with the best service through preventive medicine and supportive care. I consider you a part of the medical team and value your input. I work diligently to ensure that you are heard and your needs are met in a safe and effective manner. I want you to feel comfortable  with me as your provider and want you to know that your health concerns are important to me. ? ?For your information, our office hours are: ?Monday, Tuesday, and Thursday 8:00 AM - 5:00 PM ?Wednesday and Friday 8:00 AM - 12:00 PM.  ? ?In my time away from the office I am teaching new APP's within the system and am unavailable, but my partner, Dr. Burnard Bunting is in the office for emergent needs.  ? ?If you have questions or concerns, please call our office at [phone] or send Deborah [name] a MyChart message and we will respond as quickly as possible.  ?____________________________________________________________ ?MyChart:  ?For all urgent or time sensitive needs we ask that you please call the office to avoid delays. Our number is [phone]. ?MyChart is not constantly monitored and due to the large volume of messages a day, replies may take up to 72 business hours. ? ?MyChart Policy: ?MyChart allows for you to see your visit notes, after visit summary, provider recommendations, lab and tests results, make an appointment, request refills, and contact your provider or the office for non-urgent questions or concerns. Providers are seeing patients during normal business hours and do not have built in time to review MyChart messages.  ?We ask that you allow a minimum of 3 business days for responses to [employer]. For this reason, please do not send urgent requests through [place]. Please call the office  at [phone]. ?New and ongoing conditions may require a visit. We have virtual and in person visit available for your convenience.  ?Complex MyChart concerns may require a visit. Your provider may request you schedule a virtual or in person visit to ensure we are providing the best care possible. ?MyChart messages sent after 11:00 AM on Friday will not be received by the provider until Monday morning.  ?  ?Lab and Test Results: ?You will receive your lab and test results on MyChart as soon as they are completed and  results have been sent by the lab or testing facility. Due to this service, you will receive your results BEFORE your provider.  ?I review lab and tests results each morning prior to seeing patients. Some results require collaboration with other providers to ensure you are receiving the most appropriate care. For this reason, we ask that you please allow a minimum of 3-5 business days from the time the ALL results have been received for your provider to receive and review lab and test results and contact you about these.  ?Most lab and test result comments from the provider will be sent through [place]. Your provider may recommend changes to the plan of care, follow-up visits, repeat testing, ask questions, or request an office visit to discuss these results. You may reply directly to this message or call the office at [phone] to provide information for the provider or set up an appointment. ?In some instances, you will be called with test results and recommendations. Please let Deborah [name] know if this is preferred and we will make note of this in your chart to provide this for you.    ?If you have not heard a response to your lab or test results in 5 business days from all results returning to [place], please call the office to let Deborah [name] know. We ask that you please avoid calling prior to this time unless there is an emergent concern. Due to high call volumes, this can delay the resulting process. ? ?After Hours: ?For all non-emergency after hours needs, please call the office at [phone] and select the option to reach the on-call provider service. On-call services are shared between multiple [place] offices and therefore it will not be possible to speak directly with your provider. On-call providers may provide medical advice and recommendations, but are unable to provide refills for maintenance medications.  ?For all emergency or urgent medical needs after normal business hours, we recommend that you seek care  at the closest Urgent Care or Emergency Department to ensure appropriate treatment in a timely manner.  ?[facility] [place] has a 24 hour emergency room located on the ground floor for your convenience.  ? ?Urgent Concerns During the Business Day ?Providers are seeing patients from 8AM to [place] with a busy schedule and are most often not able to respond to non-urgent calls until the end of the day or the next business day. ?If you should have URGENT concerns during the day, please call and speak to the nurse or schedule a same day appointment so that we can address your concern without delay.  ? ?Thank you, again, for choosing me as your health care partner. I appreciate your trust and look forward to learning more about you.  ? ?Worthy Keeler, DNP, AGNP-c ?___________________________________________________________ ? ?Health Maintenance Recommendations ?Screening Testing ?Mammogram ?Every 1 -2 years based on history and risk factors ?Starting at age 60 ?Pap Smear ?Ages 21-39 every 3 years ?Ages 73-65 every 5  years with HPV testing ?More frequent testing may be required based on results and history ?Colon Cancer Screening ?Every 1-10 years based on test performed, risk factors, and history ?Starting at age 71 ?Bone Density Screening ?Every 2-10 years based on history ?Starting at age 28 for women ?Recommendations for men differ based on medication usage, history, and risk factors ?AAA Screening ?One time ultrasound ?Men 72-40 years old who have every smoked ?Lung Cancer Screening ?Low Dose Lung CT every 12 months ?Age 56-80 years with a 30 pack-year smoking history who still smoke or who have quit within the last 15 years ? ?Screening Labs ?Routine  Labs: Complete Blood Count (CBC), Complete Metabolic Panel (CMP), Cholesterol (Lipid Panel) ?Every 6-12 months based on history and medications ?May be recommended more frequently based on current conditions or previous results ?Hemoglobin A1c Lab ?Every  3-12 months based on history and previous results ?Starting at age 53 or earlier with diagnosis of diabetes, high cholesterol, BMI >26, and/or risk factors ?Frequent monitoring for patients with diabetes to

## 2021-12-16 ENCOUNTER — Ambulatory Visit (HOSPITAL_BASED_OUTPATIENT_CLINIC_OR_DEPARTMENT_OTHER): Payer: Medicare Other | Admitting: Physical Therapy

## 2021-12-16 ENCOUNTER — Encounter (HOSPITAL_BASED_OUTPATIENT_CLINIC_OR_DEPARTMENT_OTHER): Payer: Self-pay | Admitting: Physical Therapy

## 2021-12-16 DIAGNOSIS — M419 Scoliosis, unspecified: Secondary | ICD-10-CM | POA: Diagnosis not present

## 2021-12-16 DIAGNOSIS — M546 Pain in thoracic spine: Secondary | ICD-10-CM

## 2021-12-16 DIAGNOSIS — M5459 Other low back pain: Secondary | ICD-10-CM

## 2021-12-16 DIAGNOSIS — R2689 Other abnormalities of gait and mobility: Secondary | ICD-10-CM

## 2021-12-16 DIAGNOSIS — R293 Abnormal posture: Secondary | ICD-10-CM
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
## 2021-12-19 NOTE — Assessment & Plan Note (Signed)
Chronic.  Well-controlled with Retin-A topically.  We will send refill today.

## 2021-12-19 NOTE — Assessment & Plan Note (Signed)
Chronic.  Well-controlled with Linzess.  We will send refills today.  No alarm symptoms present.
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
## 2021-12-19 NOTE — Assessment & Plan Note (Signed)
Chronic.  Currently taking Seroquel for management.  We will send refills today.  No alarm symptoms present at this time.

## 2021-12-19 NOTE — Assessment & Plan Note (Addendum)
Chronic.  Previously managed with [facility].  Feels that current dosage is no longer effective for management.  Discussed with patient that she is currently on the maximum dose recommended for Adderall therefore increased dosages would not be possible from primary care however we can switch her morning dose to the extended release version and add the short acting version in the afternoon to see if this improves her control.  Will send 1 month supply at this time and she will follow-up to let me know how this is working.  If not well controlled with this recommend discussion with psychiatry for further evaluation and recommendations.  If new dosage is working well okay to send an additional 3 months of therapy and then plan follow-up after that.
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
## 2021-12-19 NOTE — Assessment & Plan Note (Signed)
Chronic.  Discussed with patient the internal changes that are present due to the scoliosis causing the internal anatomy to not lay as expected which can trigger increased reflux symptoms.  We will send refill of Prilosec for daily use and as needed famotidine today.  Patient may benefit from GI evaluation to determine if surgical options may be beneficial to reduce her symptoms.  We will continue to monitor.

## 2021-12-23 ENCOUNTER — Ambulatory Visit (HOSPITAL_BASED_OUTPATIENT_CLINIC_OR_DEPARTMENT_OTHER): Payer: Medicare Other | Admitting: Physical Therapy

## 2021-12-29 ENCOUNTER — Ambulatory Visit (HOSPITAL_BASED_OUTPATIENT_CLINIC_OR_DEPARTMENT_OTHER): Payer: Medicare Other | Admitting: Physical Therapy

## 2021-12-31 ENCOUNTER — Ambulatory Visit (HOSPITAL_BASED_OUTPATIENT_CLINIC_OR_DEPARTMENT_OTHER): Payer: Medicare Other | Attending: Physician Assistant | Admitting: Physical Therapy

## 2021-12-31 ENCOUNTER — Encounter (HOSPITAL_BASED_OUTPATIENT_CLINIC_OR_DEPARTMENT_OTHER): Payer: Self-pay | Admitting: Physical Therapy

## 2021-12-31 DIAGNOSIS — R293 Abnormal posture: Secondary | ICD-10-CM | POA: Diagnosis present

## 2021-12-31 DIAGNOSIS — G8929 Other chronic pain: Secondary | ICD-10-CM | POA: Insufficient documentation

## 2021-12-31 DIAGNOSIS — M545 Low back pain, unspecified: Secondary | ICD-10-CM | POA: Insufficient documentation

## 2021-12-31 DIAGNOSIS — M546 Pain in thoracic spine: Secondary | ICD-10-CM | POA: Insufficient documentation

## 2021-12-31 DIAGNOSIS — M5459 Other low back pain: Secondary | ICD-10-CM | POA: Diagnosis present

## 2021-12-31 DIAGNOSIS — R2689 Other abnormalities of gait and mobility: Secondary | ICD-10-CM | POA: Diagnosis present
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
## 2022-01-02 ENCOUNTER — Encounter (HOSPITAL_BASED_OUTPATIENT_CLINIC_OR_DEPARTMENT_OTHER): Payer: Self-pay | Admitting: Physical Therapy

## 2022-01-03 ENCOUNTER — Ambulatory Visit (HOSPITAL_BASED_OUTPATIENT_CLINIC_OR_DEPARTMENT_OTHER): Payer: Medicare Other | Admitting: Physical Therapy

## 2022-01-03 ENCOUNTER — Telehealth (HOSPITAL_BASED_OUTPATIENT_CLINIC_OR_DEPARTMENT_OTHER): Payer: Self-pay | Admitting: Nurse Practitioner

## 2022-01-03 DIAGNOSIS — R293 Abnormal posture: Secondary | ICD-10-CM

## 2022-01-03 DIAGNOSIS — M546 Pain in thoracic spine: Secondary | ICD-10-CM

## 2022-01-03 DIAGNOSIS — M545 Low back pain, unspecified: Secondary | ICD-10-CM

## 2022-01-03 DIAGNOSIS — G8929 Other chronic pain: Secondary | ICD-10-CM

## 2022-01-03 DIAGNOSIS — M5459 Other low back pain: Secondary | ICD-10-CM

## 2022-01-03 DIAGNOSIS — R2689 Other abnormalities of gait and mobility: Secondary | ICD-10-CM
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
## 2022-01-03 NOTE — Telephone Encounter (Signed)
Pt came in on 6/5 to drop off paperwork for provider completion. Fee waived ok per provider. Pt is requesting call when completed. Paperwork will be in provider's red dot place. Please advise.

## 2022-01-04 ENCOUNTER — Ambulatory Visit (INDEPENDENT_AMBULATORY_CARE_PROVIDER_SITE_OTHER): Payer: Medicare Other | Admitting: Family

## 2022-01-04 ENCOUNTER — Encounter: Payer: Self-pay | Admitting: Family

## 2022-01-04 DIAGNOSIS — M79671 Pain in right foot: Secondary | ICD-10-CM | POA: Diagnosis not present

## 2022-01-04 DIAGNOSIS — M2021 Hallux rigidus, right foot: Secondary | ICD-10-CM
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
## 2022-01-05 ENCOUNTER — Telehealth (HOSPITAL_BASED_OUTPATIENT_CLINIC_OR_DEPARTMENT_OTHER): Payer: Self-pay

## 2022-01-05 ENCOUNTER — Ambulatory Visit (HOSPITAL_BASED_OUTPATIENT_CLINIC_OR_DEPARTMENT_OTHER): Payer: Medicare Other | Admitting: Physical Therapy

## 2022-01-05 DIAGNOSIS — R293 Abnormal posture: Secondary | ICD-10-CM

## 2022-01-05 DIAGNOSIS — M5459 Other low back pain: Secondary | ICD-10-CM | POA: Diagnosis not present

## 2022-01-05 DIAGNOSIS — M546 Pain in thoracic spine: Secondary | ICD-10-CM

## 2022-01-05 DIAGNOSIS — R2689 Other abnormalities of gait and mobility: Secondary | ICD-10-CM

## 2022-01-05 NOTE — Telephone Encounter (Signed)
FL2 Form is complete and patient is aware.

## 2022-01-05 NOTE — Telephone Encounter (Signed)
Pt picked up completed paperwork on 6/7 at around 4pm.

## 2022-01-06 ENCOUNTER — Encounter (HOSPITAL_BASED_OUTPATIENT_CLINIC_OR_DEPARTMENT_OTHER): Payer: Self-pay | Admitting: Physical Therapy
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
## 2022-01-10 ENCOUNTER — Ambulatory Visit (HOSPITAL_BASED_OUTPATIENT_CLINIC_OR_DEPARTMENT_OTHER): Payer: Medicare Other | Admitting: Physical Therapy

## 2022-01-12 ENCOUNTER — Ambulatory Visit (HOSPITAL_BASED_OUTPATIENT_CLINIC_OR_DEPARTMENT_OTHER): Payer: Medicare Other | Admitting: Physical Therapy

## 2022-01-13 ENCOUNTER — Ambulatory Visit (INDEPENDENT_AMBULATORY_CARE_PROVIDER_SITE_OTHER): Payer: Medicare Other | Admitting: Nurse Practitioner

## 2022-01-13 ENCOUNTER — Encounter (HOSPITAL_BASED_OUTPATIENT_CLINIC_OR_DEPARTMENT_OTHER): Payer: Self-pay | Admitting: Nurse Practitioner

## 2022-01-13 ENCOUNTER — Telehealth (HOSPITAL_BASED_OUTPATIENT_CLINIC_OR_DEPARTMENT_OTHER): Payer: Self-pay | Admitting: Nurse Practitioner

## 2022-01-13 VITALS — BP 138/78 | HR 98 | Ht 64.0 in | Wt 110.4 lb

## 2022-01-13 DIAGNOSIS — F332 Major depressive disorder, recurrent severe without psychotic features: Secondary | ICD-10-CM

## 2022-01-13 DIAGNOSIS — F900 Attention-deficit hyperactivity disorder, predominantly inattentive type: Secondary | ICD-10-CM | POA: Diagnosis not present

## 2022-01-13 DIAGNOSIS — E559 Vitamin D deficiency, unspecified: Secondary | ICD-10-CM

## 2022-01-13 DIAGNOSIS — K219 Gastro-esophageal reflux disease without esophagitis: Secondary | ICD-10-CM

## 2022-01-13 MED ORDER — AMPHETAMINE-DEXTROAMPHETAMINE 30 MG PO TABS: 30.0000 mg | ORAL_TABLET | Freq: Two times a day (BID) | ORAL | 0 refills | Status: DC

## 2022-01-13 MED ORDER — VITAMIN D (ERGOCALCIFEROL) 1.25 MG (50000 UNIT) PO CAPS: 50000.0000 [IU] | ORAL_CAPSULE | ORAL | 0 refills | Status: DC

## 2022-01-13 MED ORDER — MYDAYIS 37.5 MG PO CP24: 37.5000 mg | ORAL_CAPSULE | Freq: Every morning | ORAL | 0 refills | Status: DC

## 2022-01-13 MED ORDER — QUETIAPINE FUMARATE 25 MG PO TABS: 25.0000 mg | ORAL_TABLET | Freq: Every day | ORAL | 3 refills | Status: DC

## 2022-01-13 MED ORDER — BIMATOPROST 0.03 % EX SOLN: CUTANEOUS | 12 refills | Status: DC

## 2022-01-13 MED ORDER — OMEPRAZOLE 40 MG PO CPDR: 40.0000 mg | DELAYED_RELEASE_CAPSULE | Freq: Every day | ORAL | 3 refills | Status: DC

## 2022-01-13 NOTE — Progress Notes (Signed)
[name], DNP, AGNP-c [facility] [address] [place], [place] [address] [phone] Office [phone] Fax  ESTABLISHED PATIENT- Chronic Health and/or Follow-Up Visit  Blood pressure 138/78, pulse 98, height 5\' 4"  (1.626 m), weight 110 lb 6.4 oz (50.1 kg), SpO2 97 %.  Follow-up (Pt here for f/u on new medication re-check, she stated she don't think she like it would like to be put on a new medication if possible )   HPI  Deborah [name]  is a 66 y.o. year old female presenting today for evaluation and management of the following: ADHD [name] tells me today that she does not feel that her Adderall is working well for her anymore.  She endorses difficulty concentrating and decreased attention and focus during the day. She is currently been on this medication for many years (25) and she is concerned that she has developed a tolerance. She is requesting an increased dose of her Adderall or a change in medication to something that may work better for her. She is currently on Adderall 30 mg twice a day She denies increased anxiety, palpitations, difficulty sleeping due to the medications. Of note she is also taking buprenorphine-naloxone for pain management daily Mood She tells me she is stressed out because of the complications and difficulties with her scoliosis and not being able to focus is increasing her stress levels   Her mood is currently managed with Wellbutrin and Seroquel.  ROS All ROS negative with exception of what is listed in HPI  PHYSICAL EXAM Physical Exam Vitals and nursing note reviewed.  Constitutional:      Appearance: Normal appearance.  HENT:     Head: Normocephalic.  Eyes:     Extraocular Movements: Extraocular movements intact.     Pupils: Pupils are equal, round, and reactive to light.  Cardiovascular:     Rate and Rhythm: Normal rate and regular rhythm.     Pulses: Normal pulses.      Heart sounds: Normal heart sounds.  Pulmonary:     Effort: Pulmonary effort is normal.     Breath sounds: Normal breath sounds.  Musculoskeletal:     Comments: Significant curvature to the spine noted.  Abnormal gait present.  Skin:    General: Skin is warm and dry.     Capillary Refill: Capillary refill takes less than 2 seconds.  Neurological:     General: No focal deficit present.     Mental Status: She is alert and oriented to person, place, and time.  Psychiatric:        Mood and Affect: Mood is anxious.        Speech: Speech normal.        Behavior: Behavior is cooperative.        Thought Content: Thought content normal.        Cognition and Memory: Cognition normal.        Judgment: Judgment normal.     ASSESSMENT & PLAN Problem List Items Addressed This Visit     MDD (major depressive disorder), recurrent severe, without psychosis (HCC)    Refill provided today on Seroquel.      Relevant Medications   QUEtiapine (SEROQUEL) 25 MG tablet   Gastroesophageal reflux disease    Refill provided today on Prilosec.      Relevant Medications   omeprazole (PRILOSEC) 40 MG capsule   Attention deficit hyperactivity disorder (ADHD), predominantly inattentive type - Primary    Discussion with patient today  about medication options outside of Adderall.  Lengthy discussion on the difference in how the body processes stimulant medications and that the brain can react differently to different formulations.  Patient tells me that she has been on Ritalin in the past and does not feel that this was helpful for her.  Given that finding it is unlikely that the methylphenidate-based medications will be effective for her.  That does leave [place] with very limited options as she is on the maximum safe dose of Adderall at this time.  Discussed with patient that an increase in the dose is not something that I can do for her.  We did discuss the new medication Mydayis which is in the amphetamine class of  medications.  She is agreeable to try this.  Further discussion on the possible decreased efficacy of the stimulant medication in the setting of you buprenorphine-naloxone as this is a possible outcome. For now we will send in 30-day supply of the Adderall as well as a new prescription for myodesis.  Adderall is to be used to bridge her until the approval for Mydayis this comes through as this will likely require prior authorization.  If the medication is not approved by insurance she will plan to continue the Adderall.  Unfortunately if she is requiring increased dosages of the Adderall I will have to refer her to psychiatry for management of her ADHD as she is requiring doses higher than I am comfortable managing.      Relevant Medications   amphetamine-dextroamphetamine (ADDERALL) 30 MG tablet   Other Visit Diagnoses     Vitamin D deficiency       Relevant Medications   Vitamin D, Ergocalciferol, (DRISDOL) 1.25 MG (50000 UNIT) CAPS capsule        FOLLOW-UP Return in about 4 weeks (around 02/10/2022) for Phone call check in on medication (10m).  Time: 34 minutes, >50% spent counseling, care coordination, chart review, and documentation.   [name], DNP, AGNP-c 01/13/2022  1:23 PM

## 2022-01-13 NOTE — Patient Instructions (Addendum)
We will try the new medication Mydaysis to see if this is more helpful for your ADHD symptoms.

## 2022-01-13 NOTE — Telephone Encounter (Signed)
Patient called and left voicemail after her visit today stating her insurance will not cover MYDAYIS and would like another prescription sent to the [facility] office. Called patient to confirm information. Patient confirmed request.

## 2022-01-13 NOTE — Telephone Encounter (Signed)
Please let me know if you see a PA come through.

## 2022-01-14 NOTE — Telephone Encounter (Signed)
Pt called on 6/16 regarding medication. I let pt know that PA was needed and once we have that back form ins. We can move forward. Pt showed understanding. No PA fax has come threw for this pt as of 6/16. Possible need to call insurance.  Pt also discussed issues with referral with [facility]. Looks like they tried calling to sch and she had excessive no shows and cancels so referral was closed sent opened referral back up and sent off to [employer]. Pt was made aware of this too and let her know to answer the phone to schedule and they will be in contact in the next two weeks or so. Pt showed understanding.  Please advise.

## 2022-01-17 ENCOUNTER — Ambulatory Visit (HOSPITAL_BASED_OUTPATIENT_CLINIC_OR_DEPARTMENT_OTHER): Payer: Medicare Other | Admitting: Physical Therapy

## 2022-01-17 DIAGNOSIS — M546 Pain in thoracic spine: Secondary | ICD-10-CM

## 2022-01-17 DIAGNOSIS — M5459 Other low back pain: Secondary | ICD-10-CM | POA: Diagnosis not present

## 2022-01-17 DIAGNOSIS — R293 Abnormal posture: Secondary | ICD-10-CM

## 2022-01-17 DIAGNOSIS — R2689 Other abnormalities of gait and mobility: Secondary | ICD-10-CM

## 2022-01-17 DIAGNOSIS — G8929 Other chronic pain: Secondary | ICD-10-CM

## 2022-01-18 ENCOUNTER — Encounter (HOSPITAL_BASED_OUTPATIENT_CLINIC_OR_DEPARTMENT_OTHER): Payer: Self-pay | Admitting: Physical Therapy

## 2022-01-18 NOTE — Telephone Encounter (Signed)
PA rejected. The following medication does not require approval:  Amphetamine-dextroamphet er Amphetamine-dextroamphetamine Dextroamphetamine Sulfate ER  I can continue to process PA for Mydayis or a new prescription can be sent. Please advise.
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
## 2022-01-19 NOTE — Telephone Encounter (Signed)
Please call the patient and let her know that the prior authorization for the ADHD medication was denied. The options for medications are Adderall, Adderall XR, and Dextroamphetamine Sulfate (Dexedrine).   Dexedrine is not typically used for ADHD in adults.   Given this information, I would like her to continue on her current adderall prescription and we can get the input of psychiatry for further management. They are better equipped to help manage if she should need higher doses than recommended, where I am not.

## 2022-01-20 ENCOUNTER — Ambulatory Visit (HOSPITAL_BASED_OUTPATIENT_CLINIC_OR_DEPARTMENT_OTHER): Payer: Medicare Other | Admitting: Physical Therapy

## 2022-01-20 DIAGNOSIS — M5459 Other low back pain: Secondary | ICD-10-CM

## 2022-01-20 DIAGNOSIS — G8929 Other chronic pain: Secondary | ICD-10-CM

## 2022-01-20 DIAGNOSIS — R2689 Other abnormalities of gait and mobility: Secondary | ICD-10-CM

## 2022-01-20 DIAGNOSIS — R293 Abnormal posture: Secondary | ICD-10-CM

## 2022-01-20 DIAGNOSIS — M546 Pain in thoracic spine: Secondary | ICD-10-CM
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
## 2022-01-21 ENCOUNTER — Encounter (HOSPITAL_BASED_OUTPATIENT_CLINIC_OR_DEPARTMENT_OTHER): Payer: Self-pay | Admitting: Physical Therapy

## 2022-01-25 ENCOUNTER — Telehealth (HOSPITAL_BASED_OUTPATIENT_CLINIC_OR_DEPARTMENT_OTHER): Payer: Self-pay

## 2022-01-25 ENCOUNTER — Encounter (HOSPITAL_BASED_OUTPATIENT_CLINIC_OR_DEPARTMENT_OTHER): Payer: Self-pay | Admitting: Physical Therapy

## 2022-01-25 NOTE — Telephone Encounter (Signed)
Pt was called and I reached out to her in regards to her prior authorization being denied. She stated she was going to call the pharmacy to see if their are any alternatives for her. I did mention the three medications that didn't require prior she stated non of the medications worked for previous before so she said she didn't want to try those at all.

## 2022-01-26 ENCOUNTER — Ambulatory Visit (HOSPITAL_BASED_OUTPATIENT_CLINIC_OR_DEPARTMENT_OTHER): Payer: Medicare Other | Admitting: Physical Therapy

## 2022-01-27 ENCOUNTER — Ambulatory Visit (HOSPITAL_BASED_OUTPATIENT_CLINIC_OR_DEPARTMENT_OTHER): Payer: Medicare Other | Admitting: Physical Therapy

## 2022-01-27 DIAGNOSIS — M546 Pain in thoracic spine: Secondary | ICD-10-CM

## 2022-01-27 DIAGNOSIS — M545 Low back pain, unspecified: Secondary | ICD-10-CM

## 2022-01-27 DIAGNOSIS — R293 Abnormal posture: Secondary | ICD-10-CM

## 2022-01-27 DIAGNOSIS — M5459 Other low back pain: Secondary | ICD-10-CM

## 2022-01-27 DIAGNOSIS — R2689 Other abnormalities of gait and mobility: Secondary | ICD-10-CM

## 2022-01-27 NOTE — Assessment & Plan Note (Signed)
Refill provided today on Seroquel.

## 2022-01-27 NOTE — Assessment & Plan Note (Signed)
Refill provided today on Prilosec.
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
## 2022-01-27 NOTE — Assessment & Plan Note (Addendum)
Discussion with patient today about medication options outside of Adderall.  Lengthy discussion on the difference in how the body processes stimulant medications and that the brain can react differently to different formulations.  Patient tells me that she has been on Ritalin in the past and does not feel that this was helpful for her.  Given that finding it is unlikely that the methylphenidate-based medications will be effective for her.  That does leave [place] with very limited options as she is on the maximum safe dose of Adderall at this time.  Discussed with patient that an increase in the dose is not something that I can do for her.  We did discuss the new medication Mydayis which is in the amphetamine class of medications.  She is agreeable to try this.  Further discussion on the possible decreased efficacy of the stimulant medication in the setting of you buprenorphine-naloxone as this is a possible outcome. For now we will send in 30-day supply of the Adderall as well as a new prescription for myodesis.  Adderall is to be used to bridge her until the approval for Mydayis this comes through as this will likely require prior authorization.  If the medication is not approved by insurance she will plan to continue the Adderall.  Unfortunately if she is requiring increased dosages of the Adderall I will have to refer her to psychiatry for management of her ADHD as she is requiring doses higher than I am comfortable managing.

## 2022-01-28 ENCOUNTER — Encounter (HOSPITAL_BASED_OUTPATIENT_CLINIC_OR_DEPARTMENT_OTHER): Payer: Self-pay | Admitting: Physical Therapy

## 2022-02-02 ENCOUNTER — Encounter (HOSPITAL_BASED_OUTPATIENT_CLINIC_OR_DEPARTMENT_OTHER): Payer: Self-pay | Admitting: Orthopedic Surgery

## 2022-02-02 ENCOUNTER — Ambulatory Visit (HOSPITAL_BASED_OUTPATIENT_CLINIC_OR_DEPARTMENT_OTHER): Payer: Medicare Other | Attending: Physician Assistant | Admitting: Physical Therapy

## 2022-02-02 ENCOUNTER — Encounter (HOSPITAL_BASED_OUTPATIENT_CLINIC_OR_DEPARTMENT_OTHER): Payer: Self-pay | Admitting: Physical Therapy

## 2022-02-02 ENCOUNTER — Other Ambulatory Visit: Payer: Self-pay

## 2022-02-02 DIAGNOSIS — R2689 Other abnormalities of gait and mobility: Secondary | ICD-10-CM | POA: Diagnosis present

## 2022-02-02 DIAGNOSIS — M5459 Other low back pain: Secondary | ICD-10-CM | POA: Diagnosis not present

## 2022-02-02 DIAGNOSIS — R293 Abnormal posture: Secondary | ICD-10-CM | POA: Insufficient documentation

## 2022-02-02 DIAGNOSIS — M545 Low back pain, unspecified: Secondary | ICD-10-CM | POA: Diagnosis present

## 2022-02-02 DIAGNOSIS — G8929 Other chronic pain: Secondary | ICD-10-CM | POA: Diagnosis present

## 2022-02-02 DIAGNOSIS — M546 Pain in thoracic spine: Secondary | ICD-10-CM | POA: Insufficient documentation

## 2022-02-02 NOTE — Telephone Encounter (Signed)
I have FL2 and will discuss with SB
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
## 2022-02-02 NOTE — Telephone Encounter (Signed)
Pt came by today 02/02/22 and stated that the [facility] form that was sent was filled out was invalid due to missing some things. FL2 form is in provider RED dot tray with paper attached on what needs to be done for corrections. Alos highlighted area are what need to be fixed on FL2 form. Please advise.

## 2022-02-03 ENCOUNTER — Encounter (HOSPITAL_BASED_OUTPATIENT_CLINIC_OR_DEPARTMENT_OTHER): Payer: Self-pay | Admitting: Physical Therapy

## 2022-02-03 ENCOUNTER — Telehealth (HOSPITAL_BASED_OUTPATIENT_CLINIC_OR_DEPARTMENT_OTHER): Payer: Self-pay

## 2022-02-03 NOTE — Telephone Encounter (Signed)
Deborah [name] with [employer] came by and picked [name] form for patient.

## 2022-02-07 ENCOUNTER — Ambulatory Visit (HOSPITAL_BASED_OUTPATIENT_CLINIC_OR_DEPARTMENT_OTHER): Payer: Medicare Other | Admitting: Physical Therapy

## 2022-02-07 ENCOUNTER — Encounter (HOSPITAL_BASED_OUTPATIENT_CLINIC_OR_DEPARTMENT_OTHER): Payer: Self-pay | Admitting: Physical Therapy

## 2022-02-07 DIAGNOSIS — M5459 Other low back pain: Secondary | ICD-10-CM

## 2022-02-07 DIAGNOSIS — M545 Low back pain, unspecified: Secondary | ICD-10-CM

## 2022-02-07 DIAGNOSIS — M546 Pain in thoracic spine: Secondary | ICD-10-CM

## 2022-02-07 DIAGNOSIS — R293 Abnormal posture: Secondary | ICD-10-CM

## 2022-02-07 DIAGNOSIS — R2689 Other abnormalities of gait and mobility: Secondary | ICD-10-CM
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
## 2022-02-09 ENCOUNTER — Ambulatory Visit (HOSPITAL_BASED_OUTPATIENT_CLINIC_OR_DEPARTMENT_OTHER): Payer: Medicare Other | Admitting: Physical Therapy

## 2022-02-09 DIAGNOSIS — M546 Pain in thoracic spine: Secondary | ICD-10-CM

## 2022-02-09 DIAGNOSIS — M5459 Other low back pain: Secondary | ICD-10-CM | POA: Diagnosis not present

## 2022-02-09 DIAGNOSIS — R2689 Other abnormalities of gait and mobility: Secondary | ICD-10-CM

## 2022-02-10 ENCOUNTER — Encounter (HOSPITAL_BASED_OUTPATIENT_CLINIC_OR_DEPARTMENT_OTHER): Payer: Self-pay | Admitting: Physical Therapy
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
## 2022-02-11 ENCOUNTER — Encounter (HOSPITAL_BASED_OUTPATIENT_CLINIC_OR_DEPARTMENT_OTHER): Payer: Self-pay | Admitting: Physical Therapy

## 2022-02-15 ENCOUNTER — Ambulatory Visit (HOSPITAL_BASED_OUTPATIENT_CLINIC_OR_DEPARTMENT_OTHER): Payer: Medicare Other | Admitting: Anesthesiology

## 2022-02-15 ENCOUNTER — Other Ambulatory Visit: Payer: Self-pay

## 2022-02-15 ENCOUNTER — Ambulatory Visit (HOSPITAL_BASED_OUTPATIENT_CLINIC_OR_DEPARTMENT_OTHER)
Admission: RE | Admit: 2022-02-15 | Discharge: 2022-02-15 | Disposition: A | Discharge status: Home / Self Care | Payer: Medicare Other | Attending: Orthopedic Surgery | Admitting: Orthopedic Surgery

## 2022-02-15 ENCOUNTER — Ambulatory Visit (HOSPITAL_BASED_OUTPATIENT_CLINIC_OR_DEPARTMENT_OTHER): Payer: Medicare Other

## 2022-02-15 ENCOUNTER — Encounter (HOSPITAL_BASED_OUTPATIENT_CLINIC_OR_DEPARTMENT_OTHER): Payer: Self-pay | Admitting: Orthopedic Surgery

## 2022-02-15 ENCOUNTER — Encounter (HOSPITAL_BASED_OUTPATIENT_CLINIC_OR_DEPARTMENT_OTHER)
Admission: RE | Disposition: A | Discharge status: Home / Self Care | Payer: Self-pay | Source: Home / Self Care | Attending: Orthopedic Surgery

## 2022-02-15 ENCOUNTER — Telehealth: Payer: Self-pay | Admitting: Orthopedic Surgery

## 2022-02-15 ENCOUNTER — Telehealth (HOSPITAL_BASED_OUTPATIENT_CLINIC_OR_DEPARTMENT_OTHER): Payer: Self-pay

## 2022-02-15 DIAGNOSIS — M2011 Hallux valgus (acquired), right foot: Secondary | ICD-10-CM | POA: Diagnosis not present

## 2022-02-15 DIAGNOSIS — M25774 Osteophyte, right foot: Secondary | ICD-10-CM | POA: Diagnosis not present

## 2022-02-15 DIAGNOSIS — Z472 Encounter for removal of internal fixation device: Secondary | ICD-10-CM

## 2022-02-15 DIAGNOSIS — Z87891 Personal history of nicotine dependence: Secondary | ICD-10-CM | POA: Insufficient documentation

## 2022-02-15 DIAGNOSIS — Z9889 Other specified postprocedural states: Secondary | ICD-10-CM | POA: Diagnosis not present

## 2022-02-15 DIAGNOSIS — M19071 Primary osteoarthritis, right ankle and foot: Secondary | ICD-10-CM | POA: Diagnosis not present

## 2022-02-15 DIAGNOSIS — M2021 Hallux rigidus, right foot: Secondary | ICD-10-CM | POA: Diagnosis not present

## 2022-02-15 DIAGNOSIS — Z01818 Encounter for other preprocedural examination: Secondary | ICD-10-CM

## 2022-02-15 HISTORY — PX: ARTHRODESIS METATARSALPHALANGEAL JOINT (MTPJ): SHX6566

## 2022-02-15 SURGERY — FUSION, JOINT, GREAT TOE
Anesthesia: General | Site: Toe | Laterality: Right

## 2022-02-15 MED ORDER — DEXAMETHASONE SODIUM PHOSPHATE 10 MG/ML IJ SOLN
INTRAMUSCULAR | Status: AC
  Filled 2022-02-15: qty 1

## 2022-02-15 MED ORDER — FENTANYL CITRATE (PF) 100 MCG/2ML IJ SOLN
INTRAMUSCULAR | Status: AC
  Filled 2022-02-15: qty 2

## 2022-02-15 MED ORDER — FENTANYL CITRATE (PF) 100 MCG/2ML IJ SOLN
INTRAMUSCULAR | Status: DC | PRN
Start: 2022-02-15 — End: 2022-02-15
  Administered 2022-02-15: 100 ug via INTRAVENOUS

## 2022-02-15 MED ORDER — OXYCODONE HCL 5 MG PO TABS
ORAL_TABLET | ORAL | Status: AC
  Filled 2022-02-15: qty 1

## 2022-02-15 MED ORDER — OXYCODONE HCL 5 MG PO TABS
5.0000 mg | ORAL_TABLET | Freq: Once | ORAL | Status: AC
  Administered 2022-02-15: 5 mg via ORAL

## 2022-02-15 MED ORDER — ACETAMINOPHEN 500 MG PO TABS
ORAL_TABLET | ORAL | Status: AC
  Filled 2022-02-15: qty 2

## 2022-02-15 MED ORDER — LIDOCAINE 2% (20 MG/ML) 5 ML SYRINGE
INTRAMUSCULAR | Status: AC
  Filled 2022-02-15: qty 5

## 2022-02-15 MED ORDER — PROPOFOL 10 MG/ML IV BOLUS
INTRAVENOUS | Status: DC | PRN
  Administered 2022-02-15: 110 mg via INTRAVENOUS

## 2022-02-15 MED ORDER — PHENYLEPHRINE HCL (PRESSORS) 10 MG/ML IV SOLN
INTRAVENOUS | Status: DC | PRN
  Administered 2022-02-15: 80 ug via INTRAVENOUS
  Administered 2022-02-15 (×3): 160 ug via INTRAVENOUS
  Administered 2022-02-15: 80 ug via INTRAVENOUS
  Administered 2022-02-15: 160 ug via INTRAVENOUS

## 2022-02-15 MED ORDER — LACTATED RINGERS IV SOLN: INTRAVENOUS | Status: DC | PRN

## 2022-02-15 MED ORDER — CEFAZOLIN SODIUM-DEXTROSE 2-4 GM/100ML-% IV SOLN: 2.0000 g | INTRAVENOUS | Status: DC

## 2022-02-15 MED ORDER — OXYCODONE-ACETAMINOPHEN 5-325 MG PO TABS: 1.0000 | ORAL_TABLET | ORAL | 0 refills | Status: DC | PRN

## 2022-02-15 MED ORDER — BUPIVACAINE HCL (PF) 0.5 % IJ SOLN
INTRAMUSCULAR | Status: DC | PRN
  Administered 2022-02-15: 17 mL

## 2022-02-15 MED ORDER — FENTANYL CITRATE (PF) 100 MCG/2ML IJ SOLN
25.0000 ug | INTRAMUSCULAR | Status: DC | PRN
  Administered 2022-02-15: 50 ug via INTRAVENOUS
  Administered 2022-02-15: 25 ug via INTRAVENOUS
  Administered 2022-02-15: 50 ug via INTRAVENOUS
  Administered 2022-02-15: 25 ug via INTRAVENOUS

## 2022-02-15 MED ORDER — ONDANSETRON HCL 4 MG/2ML IJ SOLN
INTRAMUSCULAR | Status: DC | PRN
  Administered 2022-02-15: 4 mg via INTRAVENOUS

## 2022-02-15 MED ORDER — LIDOCAINE HCL (CARDIAC) PF 100 MG/5ML IV SOSY
PREFILLED_SYRINGE | INTRAVENOUS | Status: DC | PRN
  Administered 2022-02-15: 30 mg via INTRAVENOUS

## 2022-02-15 MED ORDER — CEFAZOLIN SODIUM-DEXTROSE 2-3 GM-%(50ML) IV SOLR
INTRAVENOUS | Status: DC | PRN
  Administered 2022-02-15: 2 g via INTRAVENOUS

## 2022-02-15 MED ORDER — ACETAMINOPHEN 500 MG PO TABS
1000.0000 mg | ORAL_TABLET | Freq: Once | ORAL | Status: AC
Start: 2022-02-15 — End: 2022-02-15
  Administered 2022-02-15: 1000 mg via ORAL

## 2022-02-15 MED ORDER — DEXAMETHASONE SODIUM PHOSPHATE 10 MG/ML IJ SOLN
INTRAMUSCULAR | Status: DC | PRN
  Administered 2022-02-15: 5 mg via INTRAVENOUS

## 2022-02-15 MED ORDER — ONDANSETRON HCL 4 MG/2ML IJ SOLN
INTRAMUSCULAR | Status: AC
  Filled 2022-02-15: qty 2

## 2022-02-15 MED ORDER — PROPOFOL 10 MG/ML IV BOLUS
INTRAVENOUS | Status: AC
  Filled 2022-02-15: qty 20

## 2022-02-15 MED ORDER — CEFAZOLIN SODIUM-DEXTROSE 2-4 GM/100ML-% IV SOLN
INTRAVENOUS | Status: AC
  Filled 2022-02-15: qty 100

## 2022-02-15 MED ORDER — LACTATED RINGERS IV SOLN: INTRAVENOUS | Status: DC

## 2022-02-15 MED ORDER — BUPIVACAINE HCL (PF) 0.5 % IJ SOLN
INTRAMUSCULAR | Status: AC
  Filled 2022-02-15: qty 90

## 2022-02-15 MED ORDER — EPHEDRINE SULFATE (PRESSORS) 50 MG/ML IJ SOLN
INTRAMUSCULAR | Status: DC | PRN
  Administered 2022-02-15 (×2): 10 mg via INTRAVENOUS

## 2022-02-15 MED ORDER — 0.9 % SODIUM CHLORIDE (POUR BTL) OPTIME
TOPICAL | Status: DC | PRN
  Administered 2022-02-15: 200 mL

## 2022-02-15 SURGICAL SUPPLY — 53 items
BIT DRILL 2.4X140 LONG SOLID (BIT) ×1 IMPLANT
BIT DRILL SOLID 2.0 X 110MM (DRILL) IMPLANT
BLADE OSC/SAG .038X5.5 CUT EDG (BLADE) ×3 IMPLANT
BLADE SURG 15 STRL LF DISP TIS (BLADE) ×4 IMPLANT
BLADE SURG 15 STRL SS (BLADE) ×4
BNDG CMPR 9X4 STRL LF SNTH (GAUZE/BANDAGES/DRESSINGS)
BNDG COHESIVE 4X5 TAN ST LF (GAUZE/BANDAGES/DRESSINGS) ×3 IMPLANT
BNDG ESMARK 4X9 LF (GAUZE/BANDAGES/DRESSINGS) ×2 IMPLANT
BNDG GAUZE DERMACEA FLUFF (GAUZE/BANDAGES/DRESSINGS) ×1
BNDG GAUZE DERMACEA FLUFF 4 (GAUZE/BANDAGES/DRESSINGS) ×2 IMPLANT
BNDG GZE DERMACEA 4 6PLY (GAUZE/BANDAGES/DRESSINGS) ×1
COVER BACK TABLE 60X90IN (DRAPES) ×3 IMPLANT
DRAPE EXTREMITY T 121X128X90 (DISPOSABLE) ×3 IMPLANT
DRAPE IMP U-DRAPE 54X76 (DRAPES) ×3 IMPLANT
DRAPE OEC MINIVIEW 54X84 (DRAPES) ×2 IMPLANT
DRAPE U-SHAPE 47X51 STRL (DRAPES) ×3 IMPLANT
DRILL SOLID 2.0 X 110MM (DRILL) ×2
DRSG EMULSION OIL 3X3 NADH (GAUZE/BANDAGES/DRESSINGS) ×3 IMPLANT
DURAPREP 26ML APPLICATOR (WOUND CARE) ×3 IMPLANT
ELECT REM PT RETURN 9FT ADLT (ELECTROSURGICAL) ×2
ELECTRODE REM PT RTRN 9FT ADLT (ELECTROSURGICAL) ×2 IMPLANT
GAUZE 4X4 16PLY ~~LOC~~+RFID DBL (SPONGE) IMPLANT
GAUZE SPONGE 4X4 12PLY STRL (GAUZE/BANDAGES/DRESSINGS) ×3 IMPLANT
GLOVE BIOGEL PI IND STRL 9 (GLOVE) ×2 IMPLANT
GLOVE BIOGEL PI INDICATOR 9 (GLOVE) ×1
GLOVE SURG ORTHO LTX SZ9 (GLOVE) ×3 IMPLANT
GOWN STRL REUS W/ TWL LRG LVL3 (GOWN DISPOSABLE) ×2 IMPLANT
GOWN STRL REUS W/TWL LRG LVL3 (GOWN DISPOSABLE) ×2
GOWN STRL REUS W/TWL XL LVL3 (GOWN DISPOSABLE) ×3 IMPLANT
K-WIRE SMOOTH 1.6X150MM (WIRE) ×2
KWIRE SMOOTH 1.6X150MM (WIRE) IMPLANT
NDL HYPO 25X1 1.5 SAFETY (NEEDLE) IMPLANT
NEEDLE HYPO 25X1 1.5 SAFETY (NEEDLE) IMPLANT
NS IRRIG 1000ML POUR BTL (IV SOLUTION) ×3 IMPLANT
PACK BASIN DAY SURGERY FS (CUSTOM PROCEDURE TRAY) ×3 IMPLANT
PAD CAST 4YDX4 CTTN HI CHSV (CAST SUPPLIES) IMPLANT
PADDING CAST COTTON 4X4 STRL (CAST SUPPLIES)
PENCIL SMOKE EVACUATOR (MISCELLANEOUS) ×3 IMPLANT
PLATE MTP 10D SHORT RT (Plate) ×1 IMPLANT
SCREW 3.5X16 NONLOCKING (Screw) ×1 IMPLANT
SCREW LOCK PLATE R3 2.7X12 (Screw) ×3 IMPLANT
SCREW LOCK PLATE R3 2.7X14 (Screw) ×2 IMPLANT
SPIKE FLUID TRANSFER (MISCELLANEOUS) IMPLANT
SPONGE T-LAP 18X18 ~~LOC~~+RFID (SPONGE) ×3 IMPLANT
STOCKINETTE 6  STRL (DRAPES) ×2
STOCKINETTE 6 STRL (DRAPES) ×2 IMPLANT
SUT ETHILON 2 0 FSLX (SUTURE) ×3 IMPLANT
SUT ETHILON 3 0 FSL (SUTURE) IMPLANT
SUT VIC AB 2-0 CT1 27 (SUTURE) ×2
SUT VIC AB 2-0 CT1 TAPERPNT 27 (SUTURE) ×2 IMPLANT
SYR BULB EAR ULCER 3OZ GRN STR (SYRINGE) ×3 IMPLANT
SYR CONTROL 10ML LL (SYRINGE) IMPLANT
TOWEL GREEN STERILE FF (TOWEL DISPOSABLE) ×6 IMPLANT
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
## 2022-02-15 NOTE — Anesthesia Procedure Notes (Signed)
Procedure Name: LMA Insertion Date/Time: 02/15/2022 9:26 AM  Performed by: Karen Kitchens, CRNAPre-anesthesia Checklist: Patient identified, Emergency Drugs available, Suction available and Patient being monitored Patient Re-evaluated:Patient Re-evaluated prior to induction Oxygen Delivery Method: Circle system utilized Preoxygenation: Pre-oxygenation with 100% oxygen Induction Type: IV induction Ventilation: Mask ventilation without difficulty LMA: LMA inserted LMA Size: 3.0 Number of attempts: 1 Airway Equipment and Method: Bite block Placement Confirmation: positive ETCO2, CO2 detector and breath sounds checked- equal and bilateral Tube secured with: Tape Dental Injury: Teeth and Oropharynx as per pre-operative assessment

## 2022-02-15 NOTE — Telephone Encounter (Signed)
[facility] pharmacy called in regards to the pain meds Lajoyce Corners prescribed for patient. They wanted to know if Lajoyce Corners was aware that patient has an active script for Suboxen? Please advise

## 2022-02-15 NOTE — Telephone Encounter (Signed)
On hold with [facility] for 10 minutes waiting for pharmacy tech to answer. Will try again tomorrow

## 2022-02-15 NOTE — Discharge Instructions (Signed)
  Post Anesthesia Home Care Instructions  Activity: Get plenty of rest for the remainder of the day. A responsible individual must stay with you for 24 hours following the procedure.  For the next 24 hours, DO NOT: -Drive a car -Advertising copywriter -Drink alcoholic beverages -Take any medication unless instructed by your physician -Make any legal decisions or sign important papers.  Meals: Start with liquid foods such as gelatin or soup. Progress to regular foods as tolerated. Avoid greasy, spicy, heavy foods. If nausea and/or vomiting occur, drink only clear liquids until the nausea and/or vomiting subsides. Call your physician if vomiting continues.  Special Instructions/Symptoms: Your throat may feel dry or sore from the anesthesia or the breathing tube placed in your throat during surgery. If this causes discomfort, gargle with warm salt water. The discomfort should disappear within 24 hours.  *May have Tylenol today after 2:30pm 02/15/22

## 2022-02-15 NOTE — Telephone Encounter (Signed)
Patient called and left vm on nurse line requesting med refill for Adderall XR. Please advise.  Patient is currently admitted at the hospital for pre-op testing. Patient mentioned a medication for her "weak bones" and understand she will require an appt to evaluate symptoms prior to receiving prescription.

## 2022-02-15 NOTE — Op Note (Signed)
02/15/2022  10:23 AM  PATIENT:  Deborah [name]    PRE-OPERATIVE DIAGNOSIS:  Hallux Rigidus Right Foot, with deep retained hardware  POST-OPERATIVE DIAGNOSIS:  Same  PROCEDURE:  RIGHT GREAT TOE METATARSALPHALANGEAL JOINT (MTPJ) FUSION AND REMOVAL OF HARDWARE  SURGEON:  Nadara Mustard, MD  PHYSICIAN ASSISTANT:None ANESTHESIA:   General  PREOPERATIVE INDICATIONS:  [name] is a  66 y.o. female with a diagnosis of Hallux Rigidus Right Foot who failed conservative measures and elected for surgical management.    The risks benefits and alternatives were discussed with the patient preoperatively including but not limited to the risks of infection, bleeding, nerve injury, cardiopulmonary complications, the need for revision surgery, among others, and the patient was willing to proceed.  OPERATIVE IMPLANTS: Paragon 10 degree MTP fusion plate  @ENCIMAGES @  OPERATIVE FINDINGS: Patient had sclerotic bone changes of the metatarsal head and proximal phalanx secondary to her previous bunion surgery.  There was petechial bleeding after debridement of the bone.  OPERATIVE PROCEDURE: Patient was brought the operating room and underwent a general anesthetic.  After adequate levels anesthesia were obtained patient's right lower extremity was prepped using DuraPrep draped into a sterile field a timeout was called.  Patient underwent local infiltration with 17 cc of quarter percent Marcaine plain.  Her previous medial incision was used this was carried down to the MTP joint.  A periosteal elevator was used to elevate the retinaculum off the joint.  Patient had sclerotic ischemic bony changes.  There were 2 deep retrained screws that were removed there was 1 screw proximal to the plate that was not removed.  The joint was debrided of osteophytic bone spurs.  The reamer was then used and the 17 mm cup and cone reamer were used to debride the first metatarsal head and base of the proximal phalanx  back to bleeding viable subchondral bone.  The oscillating saw was used to further debride bone spurs.  The wound was irrigated rondure was further used to remove soft tissue.  The joint was reduced a compression screw was placed proximally in the oblique hole.  A distal locking screw was placed in the proximal phalanx that proximal screw was loosened and the toe was aligned and the MTP joint compressed.  The proximal screw was then compressed.  The remainder of the 4 holes were filled with locking screws.  This provided 3 screws distally and 3 screws proximally.  The wound was irrigated with normal saline.  The retinaculum was closed over the plate with 2-0 Vicryl the skin was closed using 2-0 nylon.  Sterile dressing was applied patient was extubated taken the PACU in stable condition.   DISCHARGE PLANNING:  Antibiotic duration: Preoperative antibiotics  Weightbearing: Touchdown weightbearing on the right  Pain medication: Prescription for Percocet  Dressing care/ Wound VAC: Dry dressing change in the office  Ambulatory devices: Crutches  Discharge to: Home.  Follow-up: In the office 1 week post operative.

## 2022-02-15 NOTE — Anesthesia Postprocedure Evaluation (Signed)
Anesthesia Post Note  Patient: Deborah [name]  Procedure(s) Performed: RIGHT GREAT TOE METATARSALPHALANGEAL JOINT (MTPJ) FUSION AND REMOVAL OF HARDWARE (Right: Toe)     Patient location during evaluation: PACU Anesthesia Type: General Level of consciousness: awake and alert Pain management: pain level controlled Vital Signs Assessment: post-procedure vital signs reviewed and stable Respiratory status: spontaneous breathing, nonlabored ventilation, respiratory function stable and patient connected to nasal cannula oxygen Cardiovascular status: blood pressure returned to baseline and stable Postop Assessment: no apparent nausea or vomiting Anesthetic complications: no   No notable events documented.  Last Vitals:  Vitals:   02/15/22 1100 02/15/22 1114  BP: (!) 152/87 (!) 146/91  Pulse: 78 76  Resp: 16 20  Temp:    SpO2: 96% 100%    Last Pain:  Vitals:   02/15/22 1114  TempSrc:   PainSc: 5                  Lindsy Cerullo L Krystena Reitter

## 2022-02-15 NOTE — Transfer of Care (Signed)
Immediate Anesthesia Transfer of Care Note  Patient: Deborah [name]  Procedure(s) Performed: RIGHT GREAT TOE METATARSALPHALANGEAL JOINT (MTPJ) FUSION AND REMOVAL OF HARDWARE (Right: Toe)  Patient Location: PACU  Anesthesia Type:General  Level of Consciousness: awake, alert  and patient cooperative  Airway & Oxygen Therapy: Patient Spontanous Breathing and Patient connected to face mask oxygen  Post-op Assessment: Report given to RN and Post -op Vital signs reviewed and stable  Post vital signs: Reviewed and stable  Last Vitals:  Vitals Value Taken Time  BP 138/93 02/15/22 1019  Temp    Pulse    Resp 13 02/15/22 1021  SpO2      Last Pain:  Vitals:   02/15/22 0832  TempSrc: Oral  PainSc: 0-No pain      Patients Stated Pain Goal: 3 (02/15/22 0175)  Complications: No notable events documented.

## 2022-02-15 NOTE — Telephone Encounter (Signed)
Surgery today for  RIGHT GREAT TOE METATARSALPHALANGEAL JOINT (MTPJ) FUSION AND REMOVAL OF HARDWARE  See below, thanks!

## 2022-02-16 ENCOUNTER — Encounter (HOSPITAL_BASED_OUTPATIENT_CLINIC_OR_DEPARTMENT_OTHER): Payer: Self-pay | Admitting: Orthopedic Surgery

## 2022-02-16 ENCOUNTER — Other Ambulatory Visit (HOSPITAL_BASED_OUTPATIENT_CLINIC_OR_DEPARTMENT_OTHER): Payer: Self-pay | Admitting: Nurse Practitioner

## 2022-02-16 ENCOUNTER — Telehealth (HOSPITAL_BASED_OUTPATIENT_CLINIC_OR_DEPARTMENT_OTHER): Payer: Self-pay

## 2022-02-16 DIAGNOSIS — F900 Attention-deficit hyperactivity disorder, predominantly inattentive type: Secondary | ICD-10-CM

## 2022-02-16 MED ORDER — AMPHETAMINE-DEXTROAMPHETAMINE 30 MG PO TABS: 30.0000 mg | ORAL_TABLET | Freq: Two times a day (BID) | ORAL | 0 refills | Status: DC

## 2022-02-16 NOTE — Telephone Encounter (Signed)
Refill sent. I can see her calcium results from the hospital and will be happy to send in replacement refills. No appt needed at this time.

## 2022-02-16 NOTE — Telephone Encounter (Signed)
Refill sent.

## 2022-02-16 NOTE — Progress Notes (Signed)
Left message stating courtesy call and if any questions or concerns please call the doctors office.  

## 2022-02-18 ENCOUNTER — Telehealth: Payer: Self-pay | Admitting: Orthopedic Surgery

## 2022-02-18 ENCOUNTER — Ambulatory Visit (INDEPENDENT_AMBULATORY_CARE_PROVIDER_SITE_OTHER): Payer: Medicare Other | Admitting: Nurse Practitioner

## 2022-02-18 DIAGNOSIS — K219 Gastro-esophageal reflux disease without esophagitis: Secondary | ICD-10-CM

## 2022-02-18 DIAGNOSIS — F900 Attention-deficit hyperactivity disorder, predominantly inattentive type: Secondary | ICD-10-CM

## 2022-02-18 DIAGNOSIS — F332 Major depressive disorder, recurrent severe without psychotic features: Secondary | ICD-10-CM

## 2022-02-18 MED ORDER — BUPROPION HCL ER (XL) 300 MG PO TB24: 300.0000 mg | ORAL_TABLET | Freq: Every day | ORAL | 3 refills | Status: DC

## 2022-02-18 MED ORDER — OMEPRAZOLE 40 MG PO CPDR: 40.0000 mg | DELAYED_RELEASE_CAPSULE | Freq: Every day | ORAL | 3 refills | Status: DC

## 2022-02-18 NOTE — Progress Notes (Signed)
Patient has not been able to start the medication yet, therefore we will defer the appointment until she has received this. She does need refills on her wellbutrin and prilosec. Will send medications in for patient. Full refill of adderall sent on 02/15/2022. She will reschedule appt once she starts new medication.

## 2022-02-18 NOTE — Telephone Encounter (Signed)
[facility] informed.

## 2022-02-18 NOTE — Telephone Encounter (Signed)
SW pharmacist John at [facility]. 30 tablets were filled at the time of refill earlier this week. She will be out of meds on Monday morning. We cannot fill early per law.

## 2022-02-18 NOTE — Telephone Encounter (Signed)
LM on VM per DPR okay

## 2022-02-18 NOTE — Telephone Encounter (Signed)
Patient called advised she only received 15 tabs of her pain medicine. Patient advised she need a refilled on Rx Oxycodone. Patient said she will be out of her pain medicine by the weekend. Patient said she has 15 tabs left. The number to contact patient is [phone]

## 2022-02-22 ENCOUNTER — Telehealth: Payer: Self-pay | Admitting: Orthopedic Surgery

## 2022-02-22 ENCOUNTER — Ambulatory Visit (INDEPENDENT_AMBULATORY_CARE_PROVIDER_SITE_OTHER): Payer: Medicare Other | Admitting: Orthopedic Surgery

## 2022-02-22 DIAGNOSIS — M2021 Hallux rigidus, right foot: Secondary | ICD-10-CM

## 2022-02-22 MED ORDER — OXYCODONE-ACETAMINOPHEN 10-325 MG PO TABS: 1.0000 | ORAL_TABLET | Freq: Three times a day (TID) | ORAL | 0 refills | Status: DC | PRN

## 2022-02-22 MED ORDER — OXYCODONE-ACETAMINOPHEN 5-325 MG PO TABS: 1.0000 | ORAL_TABLET | ORAL | 0 refills | Status: DC | PRN

## 2022-02-22 MED ORDER — IBUPROFEN 800 MG PO TABS: 800.0000 mg | ORAL_TABLET | Freq: Three times a day (TID) | ORAL | 0 refills | Status: DC | PRN

## 2022-02-22 NOTE — Telephone Encounter (Signed)
Patient called asked if she can get medical release to go to (PT)  Patient said she is going to [facility] for (PT) The number to contact patient is [phone]

## 2022-02-22 NOTE — Telephone Encounter (Signed)
Pt came back into office after her apt this afternoon. Stating that it is too soon for her oxycodone refill per her pharmacy. She was given 5-325mg  after her surgery, pharmacy  told her if she got the 10-325mg  she would be able to pick up today. She said she is taking two 5-325mg  so with the 10-325mg  she would take one or even 1/2 a tablet if she didn't need a whole tab. Please advise.

## 2022-02-23 ENCOUNTER — Encounter: Payer: Self-pay | Admitting: Orthopedic Surgery

## 2022-02-23 ENCOUNTER — Other Ambulatory Visit: Payer: Self-pay | Admitting: Orthopedic Surgery

## 2022-02-23 DIAGNOSIS — M2021 Hallux rigidus, right foot: Secondary | ICD-10-CM
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
## 2022-02-23 NOTE — Telephone Encounter (Signed)
02/15/2022 right GT MTP joint fusion. Please se message below and advise.

## 2022-02-23 NOTE — Telephone Encounter (Signed)
Order placed with restriction added for post op shoe

## 2022-02-24 NOTE — Telephone Encounter (Signed)
CMA states this was completed on 7/6

## 2022-02-25 ENCOUNTER — Telehealth (HOSPITAL_BASED_OUTPATIENT_CLINIC_OR_DEPARTMENT_OTHER): Payer: Self-pay | Admitting: Nurse Practitioner

## 2022-02-25 DIAGNOSIS — K5902 Outlet dysfunction constipation: Secondary | ICD-10-CM

## 2022-02-25 MED ORDER — LINACLOTIDE 145 MCG PO CAPS: 145.0000 ug | ORAL_CAPSULE | Freq: Every day | ORAL | 0 refills | Status: DC

## 2022-02-25 NOTE — Telephone Encounter (Signed)
Pt called needing refills on medication(s)  amphetamine-dextroamphetamine (ADDERALL) 30 MG tablet [[id number]]  linaclotide (LINZESS) 145 MCG CAPS capsule [[id number]]   QUEtiapine (SEROQUEL) 25 MG tablet [[id number]]    Sent to this pharmacy:  [id number] - [place], [place] - [address] AT [facility] [address]   Pt has this many left:  Pt did not say how many is left but will be out before her appt she scheduled for 03/22/22   Please advise.

## 2022-03-01 HISTORY — PX: SMALL BOWEL REPAIR: SHX6447

## 2022-03-02 ENCOUNTER — Telehealth (HOSPITAL_BASED_OUTPATIENT_CLINIC_OR_DEPARTMENT_OTHER): Payer: Self-pay | Admitting: Nurse Practitioner

## 2022-03-02 NOTE — Telephone Encounter (Signed)
Received a fax from [employer] [place] regarding needing possible assistance with getting a bone quality optimized due to her medical history, referral was put in from them for her o seen [facility] but appt isnt until 04/08/22. Paperwork will be in providers Green tray for her to look over and respond back to PA. Please advise.

## 2022-03-04 ENCOUNTER — Ambulatory Visit (INDEPENDENT_AMBULATORY_CARE_PROVIDER_SITE_OTHER): Payer: Medicare Other | Admitting: Family

## 2022-03-04 ENCOUNTER — Encounter: Payer: Self-pay | Admitting: Family

## 2022-03-04 DIAGNOSIS — M2021 Hallux rigidus, right foot: Secondary | ICD-10-CM
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
## 2022-03-10 ENCOUNTER — Encounter: Payer: Medicare Other | Admitting: Orthopedic Surgery

## 2022-03-14 ENCOUNTER — Ambulatory Visit (HOSPITAL_BASED_OUTPATIENT_CLINIC_OR_DEPARTMENT_OTHER): Payer: Medicare Other | Attending: Physician Assistant | Admitting: Physical Therapy

## 2022-03-14 DIAGNOSIS — M2021 Hallux rigidus, right foot: Secondary | ICD-10-CM | POA: Insufficient documentation

## 2022-03-14 DIAGNOSIS — R2689 Other abnormalities of gait and mobility: Secondary | ICD-10-CM | POA: Diagnosis present

## 2022-03-14 DIAGNOSIS — M545 Low back pain, unspecified: Secondary | ICD-10-CM | POA: Diagnosis present

## 2022-03-14 DIAGNOSIS — R293 Abnormal posture: Secondary | ICD-10-CM

## 2022-03-14 DIAGNOSIS — G8929 Other chronic pain: Secondary | ICD-10-CM | POA: Diagnosis present

## 2022-03-14 DIAGNOSIS — M5459 Other low back pain: Secondary | ICD-10-CM | POA: Diagnosis present

## 2022-03-14 DIAGNOSIS — M546 Pain in thoracic spine: Secondary | ICD-10-CM
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
## 2022-03-15 ENCOUNTER — Encounter (HOSPITAL_BASED_OUTPATIENT_CLINIC_OR_DEPARTMENT_OTHER): Payer: Self-pay | Admitting: Physical Therapy

## 2022-03-16 ENCOUNTER — Encounter: Payer: Self-pay | Admitting: Family

## 2022-03-16 ENCOUNTER — Ambulatory Visit (INDEPENDENT_AMBULATORY_CARE_PROVIDER_SITE_OTHER): Payer: Medicare Other | Admitting: Family

## 2022-03-16 DIAGNOSIS — M2021 Hallux rigidus, right foot: Secondary | ICD-10-CM

## 2022-03-16 DIAGNOSIS — M21619 Bunion of unspecified foot: Secondary | ICD-10-CM

## 2022-03-16 MED ORDER — OXYCODONE-ACETAMINOPHEN 5-325 MG PO TABS: 1.0000 | ORAL_TABLET | ORAL | 0 refills | Status: DC | PRN
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
## 2022-03-18 ENCOUNTER — Encounter (HOSPITAL_BASED_OUTPATIENT_CLINIC_OR_DEPARTMENT_OTHER): Payer: Self-pay | Admitting: Physical Therapy

## 2022-03-18 ENCOUNTER — Ambulatory Visit (HOSPITAL_BASED_OUTPATIENT_CLINIC_OR_DEPARTMENT_OTHER): Payer: Medicare Other | Admitting: Physical Therapy

## 2022-03-18 DIAGNOSIS — M545 Low back pain, unspecified: Secondary | ICD-10-CM

## 2022-03-18 DIAGNOSIS — M5459 Other low back pain: Secondary | ICD-10-CM

## 2022-03-18 DIAGNOSIS — R293 Abnormal posture: Secondary | ICD-10-CM

## 2022-03-18 DIAGNOSIS — M546 Pain in thoracic spine: Secondary | ICD-10-CM

## 2022-03-18 DIAGNOSIS — R2689 Other abnormalities of gait and mobility: Secondary | ICD-10-CM
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
## 2022-03-22 ENCOUNTER — Ambulatory Visit (HOSPITAL_BASED_OUTPATIENT_CLINIC_OR_DEPARTMENT_OTHER): Payer: Medicare Other | Admitting: Nurse Practitioner

## 2022-03-23 ENCOUNTER — Ambulatory Visit (HOSPITAL_BASED_OUTPATIENT_CLINIC_OR_DEPARTMENT_OTHER): Payer: Medicare Other | Admitting: Physical Therapy

## 2022-03-23 ENCOUNTER — Encounter (HOSPITAL_BASED_OUTPATIENT_CLINIC_OR_DEPARTMENT_OTHER): Payer: Self-pay | Admitting: Physical Therapy

## 2022-03-23 DIAGNOSIS — M546 Pain in thoracic spine: Secondary | ICD-10-CM

## 2022-03-23 DIAGNOSIS — R2689 Other abnormalities of gait and mobility: Secondary | ICD-10-CM

## 2022-03-23 DIAGNOSIS — M5459 Other low back pain: Secondary | ICD-10-CM

## 2022-03-23 DIAGNOSIS — G8929 Other chronic pain: Secondary | ICD-10-CM

## 2022-03-23 DIAGNOSIS — R293 Abnormal posture: Secondary | ICD-10-CM

## 2022-03-24 ENCOUNTER — Encounter (HOSPITAL_BASED_OUTPATIENT_CLINIC_OR_DEPARTMENT_OTHER): Payer: Self-pay | Admitting: Physical Therapy
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
## 2022-03-30 NOTE — Telephone Encounter (Signed)
Confirm paperwork and document status

## 2022-03-31 ENCOUNTER — Encounter (HOSPITAL_BASED_OUTPATIENT_CLINIC_OR_DEPARTMENT_OTHER): Payer: Self-pay | Admitting: Physical Therapy

## 2022-03-31 ENCOUNTER — Telehealth (HOSPITAL_BASED_OUTPATIENT_CLINIC_OR_DEPARTMENT_OTHER): Payer: Self-pay

## 2022-03-31 ENCOUNTER — Ambulatory Visit (HOSPITAL_BASED_OUTPATIENT_CLINIC_OR_DEPARTMENT_OTHER): Payer: Medicare Other | Admitting: Physical Therapy

## 2022-03-31 DIAGNOSIS — M5459 Other low back pain: Secondary | ICD-10-CM | POA: Diagnosis not present

## 2022-03-31 DIAGNOSIS — M545 Low back pain, unspecified: Secondary | ICD-10-CM

## 2022-03-31 DIAGNOSIS — M546 Pain in thoracic spine: Secondary | ICD-10-CM

## 2022-03-31 DIAGNOSIS — R293 Abnormal posture: Secondary | ICD-10-CM

## 2022-03-31 DIAGNOSIS — F900 Attention-deficit hyperactivity disorder, predominantly inattentive type: Secondary | ICD-10-CM

## 2022-03-31 DIAGNOSIS — R2689 Other abnormalities of gait and mobility: Secondary | ICD-10-CM
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
## 2022-03-31 NOTE — Telephone Encounter (Signed)
Patient called she is concerned regarding her adderal. She needs a 2 week refill today this will put her back on a 30 refill schedule. Then she leaves for [place] to take care of her sister after her sister's surgery. She will need a refill 9/17th thru 10/16th back on another 30day refill cycle. If you have any questions she is willing to make an appointment to come in.

## 2022-04-05 MED ORDER — AMPHETAMINE-DEXTROAMPHETAMINE 30 MG PO TABS: 30.0000 mg | ORAL_TABLET | Freq: Two times a day (BID) | ORAL | 0 refills | Status: DC

## 2022-04-05 NOTE — Telephone Encounter (Signed)
Refill sent for 30 days. Note sent to pharmacy to allow for one time OK to fill Deborah [name]. Next refill will not be available until 10/16

## 2022-04-06 ENCOUNTER — Ambulatory Visit (HOSPITAL_BASED_OUTPATIENT_CLINIC_OR_DEPARTMENT_OTHER): Payer: Medicare Other | Attending: Physician Assistant | Admitting: Physical Therapy

## 2022-04-06 ENCOUNTER — Encounter (HOSPITAL_BASED_OUTPATIENT_CLINIC_OR_DEPARTMENT_OTHER): Payer: Self-pay | Admitting: Physical Therapy

## 2022-04-06 DIAGNOSIS — R112 Nausea with vomiting, unspecified: Secondary | ICD-10-CM | POA: Diagnosis not present

## 2022-04-06 DIAGNOSIS — R2689 Other abnormalities of gait and mobility: Secondary | ICD-10-CM

## 2022-04-06 DIAGNOSIS — M5459 Other low back pain: Secondary | ICD-10-CM

## 2022-04-06 DIAGNOSIS — M546 Pain in thoracic spine: Secondary | ICD-10-CM

## 2022-04-06 DIAGNOSIS — K5652 Intestinal adhesions [bands] with complete obstruction: Secondary | ICD-10-CM | POA: Diagnosis not present

## 2022-04-06 DIAGNOSIS — R293 Abnormal posture: Secondary | ICD-10-CM
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
## 2022-04-08 ENCOUNTER — Inpatient Hospital Stay (HOSPITAL_COMMUNITY): Payer: Medicare Other

## 2022-04-08 ENCOUNTER — Other Ambulatory Visit: Payer: Self-pay

## 2022-04-08 ENCOUNTER — Encounter (HOSPITAL_COMMUNITY): Payer: Self-pay | Admitting: *Deleted

## 2022-04-08 ENCOUNTER — Inpatient Hospital Stay (HOSPITAL_COMMUNITY)
Admission: EM | Admit: 2022-04-08 | Discharge: 2022-04-13 | DRG: 389 | Disposition: A | Discharge status: Home / Self Care | Payer: Medicare Other | Attending: Internal Medicine | Admitting: Internal Medicine

## 2022-04-08 ENCOUNTER — Emergency Department (HOSPITAL_COMMUNITY): Payer: Medicare Other

## 2022-04-08 DIAGNOSIS — Z9049 Acquired absence of other specified parts of digestive tract: Secondary | ICD-10-CM | POA: Diagnosis not present

## 2022-04-08 DIAGNOSIS — Z806 Family history of leukemia: Secondary | ICD-10-CM

## 2022-04-08 DIAGNOSIS — G894 Chronic pain syndrome: Secondary | ICD-10-CM | POA: Diagnosis present

## 2022-04-08 DIAGNOSIS — Z79899 Other long term (current) drug therapy: Secondary | ICD-10-CM

## 2022-04-08 DIAGNOSIS — D509 Iron deficiency anemia, unspecified: Secondary | ICD-10-CM | POA: Diagnosis present

## 2022-04-08 DIAGNOSIS — G2581 Restless legs syndrome: Secondary | ICD-10-CM | POA: Diagnosis present

## 2022-04-08 DIAGNOSIS — Z8249 Family history of ischemic heart disease and other diseases of the circulatory system: Secondary | ICD-10-CM | POA: Diagnosis not present

## 2022-04-08 DIAGNOSIS — Z87891 Personal history of nicotine dependence: Secondary | ICD-10-CM | POA: Diagnosis not present

## 2022-04-08 DIAGNOSIS — K5652 Intestinal adhesions [bands] with complete obstruction: Principal | ICD-10-CM

## 2022-04-08 DIAGNOSIS — Z66 Do not resuscitate: Secondary | ICD-10-CM | POA: Diagnosis present

## 2022-04-08 DIAGNOSIS — R112 Nausea with vomiting, unspecified: Secondary | ICD-10-CM | POA: Diagnosis present

## 2022-04-08 DIAGNOSIS — K219 Gastro-esophageal reflux disease without esophagitis: Secondary | ICD-10-CM | POA: Diagnosis present

## 2022-04-08 DIAGNOSIS — R739 Hyperglycemia, unspecified: Secondary | ICD-10-CM | POA: Diagnosis present

## 2022-04-08 DIAGNOSIS — F332 Major depressive disorder, recurrent severe without psychotic features: Secondary | ICD-10-CM | POA: Diagnosis present

## 2022-04-08 DIAGNOSIS — K56609 Unspecified intestinal obstruction, unspecified as to partial versus complete obstruction: Secondary | ICD-10-CM | POA: Diagnosis not present

## 2022-04-08 DIAGNOSIS — N390 Urinary tract infection, site not specified: Secondary | ICD-10-CM | POA: Diagnosis present

## 2022-04-08 DIAGNOSIS — D72828 Other elevated white blood cell count: Secondary | ICD-10-CM | POA: Diagnosis present

## 2022-04-08 LAB — CBC
HCT: 41 % (ref 36.0–46.0)
Hemoglobin: 12.5 g/dL (ref 12.0–15.0)
MCH: 22.5 pg — ABNORMAL LOW (ref 26.0–34.0)
MCHC: 30.5 g/dL (ref 30.0–36.0)
MCV: 73.9 fL — ABNORMAL LOW (ref 80.0–100.0)
Platelets: 342 10*3/uL (ref 150–400)
RBC: 5.55 MIL/uL — ABNORMAL HIGH (ref 3.87–5.11)
RDW: 20 % — ABNORMAL HIGH (ref 11.5–15.5)
WBC: 13.2 10*3/uL — ABNORMAL HIGH (ref 4.0–10.5)
nRBC: 0 % (ref 0.0–0.2)

## 2022-04-08 LAB — LACTIC ACID, PLASMA: Lactic Acid, Venous: 1.4 mmol/L (ref 0.5–1.9)

## 2022-04-08 LAB — COMPREHENSIVE METABOLIC PANEL
ALT: 18 U/L (ref 0–44)
AST: 32 U/L (ref 15–41)
Albumin: 4.4 g/dL (ref 3.5–5.0)
Alkaline Phosphatase: 58 U/L (ref 38–126)
Anion gap: 12 (ref 5–15)
BUN: 17 mg/dL (ref 8–23)
CO2: 26 mmol/L (ref 22–32)
Calcium: 9.7 mg/dL (ref 8.9–10.3)
Chloride: 100 mmol/L (ref 98–111)
Creatinine, Ser: 0.74 mg/dL (ref 0.44–1.00)
GFR, Estimated: >60 mL/min (ref >60)
Glucose, Bld: 120 mg/dL — ABNORMAL HIGH (ref 70–99)
Potassium: 4.5 mmol/L (ref 3.5–5.1)
Sodium: 138 mmol/L (ref 135–145)
Total Bilirubin: 0.7 mg/dL (ref 0.3–1.2)
Total Protein: 7 g/dL (ref 6.5–8.1)

## 2022-04-08 LAB — HIV ANTIBODY (ROUTINE TESTING W REFLEX): HIV Screen 4th Generation wRfx: NONREACTIVE

## 2022-04-08 LAB — LIPASE, BLOOD: Lipase: 26 U/L (ref 11–51)

## 2022-04-08 MED ORDER — ACETAMINOPHEN 650 MG RE SUPP: 650.0000 mg | Freq: Four times a day (QID) | RECTAL | Status: DC | PRN

## 2022-04-08 MED ORDER — ONDANSETRON HCL 4 MG/2ML IJ SOLN
4.0000 mg | Freq: Four times a day (QID) | INTRAMUSCULAR | Status: DC | PRN
  Administered 2022-04-08 – 2022-04-11 (×6): 4 mg via INTRAVENOUS
  Filled 2022-04-08 (×7): qty 2

## 2022-04-08 MED ORDER — HYDROMORPHONE HCL 1 MG/ML IJ SOLN
0.5000 mg | Freq: Once | INTRAMUSCULAR | Status: AC
  Administered 2022-04-08: 0.5 mg via INTRAVENOUS
  Filled 2022-04-08: qty 1

## 2022-04-08 MED ORDER — HYDRALAZINE HCL 20 MG/ML IJ SOLN: 10.0000 mg | INTRAMUSCULAR | Status: DC | PRN

## 2022-04-08 MED ORDER — HALOPERIDOL LACTATE 5 MG/ML IJ SOLN
5.0000 mg | Freq: Four times a day (QID) | INTRAMUSCULAR | Status: DC | PRN
  Administered 2022-04-09 – 2022-04-11 (×2): 5 mg via INTRAVENOUS
  Filled 2022-04-08 (×2): qty 1

## 2022-04-08 MED ORDER — MORPHINE SULFATE (PF) 2 MG/ML IV SOLN
2.0000 mg | INTRAVENOUS | Status: DC | PRN
  Administered 2022-04-08 – 2022-04-09 (×8): 2 mg via INTRAVENOUS
  Filled 2022-04-08 (×9): qty 1

## 2022-04-08 MED ORDER — SODIUM CHLORIDE 0.9 % IV BOLUS (SEPSIS)
1000.0000 mL | Freq: Once | INTRAVENOUS | Status: AC
  Administered 2022-04-08: 1000 mL via INTRAVENOUS

## 2022-04-08 MED ORDER — IOHEXOL 300 MG/ML  SOLN
100.0000 mL | Freq: Once | INTRAMUSCULAR | Status: AC | PRN
  Administered 2022-04-08: 100 mL via INTRAVENOUS

## 2022-04-08 MED ORDER — BUPRENORPHINE HCL-NALOXONE HCL 8-2 MG SL SUBL
1.0000 | SUBLINGUAL_TABLET | Freq: Every day | SUBLINGUAL | Status: DC
  Administered 2022-04-08 – 2022-04-13 (×3): 1 via SUBLINGUAL
  Filled 2022-04-08 (×6): qty 1

## 2022-04-08 MED ORDER — LACTATED RINGERS IV SOLN: INTRAVENOUS | Status: DC

## 2022-04-08 MED ORDER — LIDOCAINE VISCOUS HCL 2 % MT SOLN: 3.0000 mL | Freq: Once | OROMUCOSAL | Status: DC

## 2022-04-08 MED ORDER — ACETAMINOPHEN 325 MG PO TABS: 650.0000 mg | ORAL_TABLET | Freq: Four times a day (QID) | ORAL | Status: DC | PRN

## 2022-04-08 MED ORDER — LIDOCAINE VISCOUS HCL 2 % MT SOLN
3.0000 mL | Freq: Once | OROMUCOSAL | Status: AC
  Administered 2022-04-08: 3 mL via OROMUCOSAL

## 2022-04-08 MED ORDER — DIATRIZOATE MEGLUMINE & SODIUM 66-10 % PO SOLN
90.0000 mL | Freq: Once | ORAL | Status: AC
Start: 2022-04-08 — End: 2022-04-08
  Administered 2022-04-08: 90 mL via NASOGASTRIC
  Filled 2022-04-08 (×2): qty 90

## 2022-04-08 MED ORDER — ONDANSETRON 4 MG PO TBDP: 4.0000 mg | ORAL_TABLET | Freq: Four times a day (QID) | ORAL | Status: DC | PRN

## 2022-04-08 MED ORDER — ONDANSETRON HCL 4 MG/2ML IJ SOLN
4.0000 mg | Freq: Once | INTRAMUSCULAR | Status: AC | PRN
Start: 2022-04-08 — End: 2022-04-08
  Administered 2022-04-08: 4 mg via INTRAVENOUS
  Filled 2022-04-08: qty 2

## 2022-04-08 MED ORDER — ONDANSETRON HCL 4 MG/2ML IJ SOLN
4.0000 mg | Freq: Once | INTRAMUSCULAR | Status: AC
  Administered 2022-04-08: 4 mg via INTRAVENOUS
  Filled 2022-04-08: qty 2

## 2022-04-08 MED ORDER — FENTANYL CITRATE PF 50 MCG/ML IJ SOSY
50.0000 ug | PREFILLED_SYRINGE | INTRAMUSCULAR | Status: DC | PRN
  Administered 2022-04-08: 50 ug via INTRAVENOUS
  Filled 2022-04-08: qty 1

## 2022-04-08 NOTE — ED Notes (Signed)
Went in to assess pts vital signs pt refused temperate.  Pt has been eating ice.  Ice removed from pt room.  Staff will wait for oral temperature in 15-20mins.

## 2022-04-08 NOTE — ED Notes (Signed)
NG tube readjusted and xray ordered.
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
## 2022-04-08 NOTE — ED Notes (Signed)
NG tube removed and reinserted , placement checked x 2 RN
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
## 2022-04-08 NOTE — ED Notes (Signed)
Patient aware she needs a urine spec.  

## 2022-04-08 NOTE — ED Notes (Addendum)
This RN attempted to clean pt and change pt bed linen, however pt is refusing care peri-care and linen change until she can receive more pain medication. MD notified.

## 2022-04-08 NOTE — ED Notes (Signed)
Pt had pulled out her NG tube resulting it to be disconnected, it is currently not in place anymore. MD notified.

## 2022-04-08 NOTE — ED Notes (Signed)
Deborah [name] sister [phone] requesting an update on the patient
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
## 2022-04-09 ENCOUNTER — Inpatient Hospital Stay (HOSPITAL_COMMUNITY): Payer: Medicare Other

## 2022-04-09 DIAGNOSIS — K56609 Unspecified intestinal obstruction, unspecified as to partial versus complete obstruction: Secondary | ICD-10-CM

## 2022-04-09 DIAGNOSIS — K5652 Intestinal adhesions [bands] with complete obstruction: Secondary | ICD-10-CM

## 2022-04-09 MED ORDER — MORPHINE SULFATE (PF) 2 MG/ML IV SOLN
2.0000 mg | INTRAVENOUS | Status: DC | PRN
  Administered 2022-04-09 – 2022-04-10 (×6): 4 mg via INTRAVENOUS
  Administered 2022-04-10: 2 mg via INTRAVENOUS
  Administered 2022-04-10 (×4): 4 mg via INTRAVENOUS
  Filled 2022-04-09: qty 2
  Filled 2022-04-09: qty 1
  Filled 2022-04-09 (×9): qty 2

## 2022-04-09 NOTE — Progress Notes (Signed)
PROGRESS NOTE    Deborah [name]  WUJ:[id number] DOB: January 18, 1956 DOA: 04/08/2022 PCP: Tollie Eth, NP  Chief Complaint  Patient presents with   Abdominal Pain    Brief Narrative:  Deborah [name] is Deborah [name] 66 y.o. female with medical history significant of chronic pain syndrome and anxiety/depression as well as prior h/o SBO presenting with abdominal pain. She started with abdominal pain yesterday AM.  She had surgery for Deborah [name] blockage Deborah [name] year ago last August.  She never had any problems before that.  She is reluctant to answer questions and generally cantankerous, requesting ice chips and pain medications.   Her last admission was from 7/3-6/22 for partial SBO.  She had an NG tube placed and progressed without need for intervention.  Her admission prior to that was for SBO from 8/29-04/07/20.  She underwent L inguinal hernia repair with small bowel resection x 1.       ER Course:  Carryover, per Dr. Margo Aye:   66 year old female with history of chronic pain syndrome, chronic anxiety/depression, prior bowel obstruction presents with abdominal pain.  Onset yesterday with severe diffuse abdominal pain.  Associated with nausea and nonbloody emesis pain.  She reports decreased stool output.  She suspects she has another bowel obstruction.  This was confirmed on CT scan.  General surgery consulted and will see in consultation.     Assessment & Plan:   Principal Problem:   Bowel obstruction (HCC) Active Problems:   MDD (major depressive disorder), recurrent severe, without psychosis (HCC)   Chronic pain syndrome   Assessment and Plan: SBO - cT with high grade small bowel obstruction due to twisted small bowel in the low abdomen, just before an enteroenteric anastomosis - NGT to LIS - contrast seen in ascending colon on abd film this AM - appreciate surgery assistance - continue NGT with bilious output, anticipating return of bowel function soon with contrast in colon - currently on IV  pain meds   Chronic pain syndrome -I have reviewed this patient in the [place] Controlled Substances Reporting System.  She is receiving medications from multiple providers. -unfortunately she's not tolerating suboxone tablets due to taste, needs refill of her sublingual tablets -has IV pain meds ordered while she's unable to take PO  -will transition to PO pain meds when able, she'll need to follow up with her primary pain med prescriber for refills    Mood d/o -Hold home meds - Adderall, Bupropion, Seroquel  -Suggest resumption of Bupropion and Seroquel when possible -Will add prn IV Haldol for now      DVT prophylaxis: SCD Code Status: dnr Family Communication: none Disposition:   Status is: Inpatient Remains inpatient appropriate because: continued management of SBO   Consultants:  General surgery  Procedures:  none  Antimicrobials:  Anti-infectives (From admission, onward)    None       Subjective: Asking to continue the morphine at 4 mg Doesn't want to take suboxone pill, tastes bad Doesn't have enough of her film   Objective: Vitals:   04/08/22 2301 04/09/22 0003 04/09/22 0459 04/09/22 0811  BP:  (!) 132/93 (!) 145/74 104/66  Pulse:  (!) 103 96 91  Resp:  18 18 16   Temp: 98.4 F (36.9 C) 99 F (37.2 C) 98.6 F (37 C) 98 F (36.7 C)  TempSrc: Oral Oral Oral Oral  SpO2:  100% 92% 91%  Weight:      Height:        Intake/Output Summary (Last 24  hours) at 04/09/2022 1615 Last data filed at 04/09/2022 1400 Gross per 24 hour  Intake 2045.53 ml  Output 200 ml  Net 1845.53 ml   Filed Weights   04/08/22 2005  Weight: 49.8 kg    Examination:  General exam: Appears calm and comfortable  Respiratory system: unlabored Cardiovascular system: RRR Gastrointestinal system: NG in place Central nervous system: Alert and oriented. No focal neurological deficits. Extremities: no lee   Data Reviewed: I have personally reviewed following labs and imaging  studies  CBC: Recent Labs  Lab 04/08/22 0235  WBC 13.2*  HGB 12.5  HCT 41.0  MCV 73.9*  PLT 342    Basic Metabolic Panel: Recent Labs  Lab 04/08/22 0235  NA 138  K 4.5  CL 100  CO2 26  GLUCOSE 120*  BUN 17  CREATININE 0.74  CALCIUM 9.7    GFR: Estimated Creatinine Clearance: 55.1 mL/min (by C-G formula based on SCr of 0.74 mg/dL).  Liver Function Tests: Recent Labs  Lab 04/08/22 0235  AST 32  ALT 18  ALKPHOS 58  BILITOT 0.7  PROT 7.0  ALBUMIN 4.4    CBG: No results for input(s): "GLUCAP" in the last 168 hours.   No results found for this or any previous visit (from the past 240 hour(s)).       Radiology Studies: DG Abd Portable 1V-Small Bowel Obstruction Protocol-initial, 8 hr delay  Result Date: 04/09/2022 CLINICAL DATA:  Small-bowel obstruction. EXAM: PORTABLE ABDOMEN - 1 VIEW COMPARISON:  Abdomen film yesterday at 2:24 p.m., abdominopelvic CT yesterday at 3:48 Chantee Cerino.m. FINDINGS: NGT folds over on itself in the gastric antrum and is kinked at the side hole, therefore may not be properly functioning. Contrast is now seen in the ascending colon moderate stool seen in the more distal colon. There is contrast opacifying the bladder as well. Dilated small bowel is no longer seen in the lower abdomen and pelvis but is still seen in the mid abdomen with segments up to 3.8 cm caliber. Motor severe levorotary lumbar scoliosis is redemonstrated. No supine evidence of free air. IMPRESSION: 1. Some improvement. Small bowel dilatation no longer seen in the lower abdomen and pelvis but still seen in the mid abdomen up to 3.8 cm. 2. Contrast now seen in the ascending colon but not seen in the more distal colon. 3. NGT folded back on itself at the gastric antrum and is kinked at the side hole. Clinical correlation. Electronically Signed   By: Almira Bar M.D.   On: 04/09/2022 07:43   DG Abd 1 View  Result Date: 04/08/2022 CLINICAL DATA:  NG tube placement EXAM: ABDOMEN - 1  VIEW COMPARISON:  Same-day radiograph at 0938 hours FINDINGS: Cammie Faulstich single spot oblique film of the abdomen demonstrates Fard Borunda nasogastric tube tip and side port projecting over the expected location of the stomach. 0.2 minutes of fluoroscopic time was utilized with Kendrew Paci dose of 1.5 mGy. IMPRESSION: Nasogastric tube with tip and side port projecting over the expected location of the stomach. Electronically Signed   By: Maudry Mayhew M.D.   On: 04/08/2022 15:06   DG Abdomen 1 View  Result Date: 04/08/2022 CLINICAL DATA:  NG tube adjustment EXAM: ABDOMEN - 1 VIEW COMPARISON:  04/08/2022 FINDINGS: Deondray Ospina spot film and fluoroscopic screen save obtained during nasogastric tube adjustment were submitted. Both images demonstrate NG tube distal tip and side port projecting below the diaphragms within the expected location of the gastric body. 1 minute 30 seconds of fluoroscopy time was  utilized. IMPRESSION: NG tube positioning as described. Electronically Signed   By: Duanne Guess D.O.   On: 04/08/2022 10:03   DG Abd Portable 1V-Small Bowel Protocol-Position Verification  Addendum Date: 04/08/2022   ADDENDUM REPORT: 04/08/2022 08:31 ADDENDUM: These results were called by telephone at the time of interpretation on 04/08/2022 at 8:30 am to provider Dr. Ophelia Charter, who verbally acknowledged these results. Electronically Signed   By: Donzetta Kohut M.D.   On: 04/08/2022 08:31   Result Date: 04/08/2022 CLINICAL DATA:  Nasogastric tube post repositioning. EXAM: PORTABLE ABDOMEN - 1 VIEW COMPARISON:  Comparison made with imaging from April 08, 2022. FINDINGS: EKG leads project over the chest and abdomen. Gastric tube remains coiled likely in proximal stomach extending into the esophagus, tip directed towards the esophagus. Side port in similar position likely above the GE junction with tip above the side port in the distal esophagus. Visualized lung bases are clear. Bowel gas pattern still with gas-filled distended loops of bowel in the  lower abdomen distended loops of small bowel better assessed on previous imaging studies. Urinary bladder is partially filled with excreted contrast media. IMPRESSION: 1. Gastric tube extending into the stomach with redundant loop, then ascending into the esophagus. Side port not changed with respect position and tip directed into the mid esophagus as on the previous radiograph, tip off the field of view. 2. Persistent small bowel distension in the lower abdomen grossly similar to previous imaging. Electronically Signed: By: Donzetta Kohut M.D. On: 04/08/2022 08:06   DG Abdomen 1 View  Result Date: 04/08/2022 CLINICAL DATA:  NG tube placement. EXAM: ABDOMEN - 1 VIEW COMPARISON:  Earlier same day. FINDINGS: NG tube is again noted to be folded back on itself in the region of the diaphragmatic hiatus. Patient has Sayer Masini hiatal hernia on CT scan from earlier today and probably the tube is folded on itself in the hiatal hernia before traversing cranially back up the esophagus. The tip has not been included on the film but is probably in the upper thoracic esophagus. IMPRESSION: NG tube remains folded back on itself, likely secondary to the hiatal hernia. Tube extends cranially up the esophagus, with the tip not included on the film but probably in the upper thoracic esophagus. Electronically Signed   By: Kennith Center M.D.   On: 04/08/2022 08:10   DG Abdomen 1 View  Result Date: 04/08/2022 CLINICAL DATA:  NG tube placement. EXAM: ABDOMEN - 1 VIEW COMPARISON:  02/01/2021 FINDINGS: NG tube passes to the level of the distal esophagus where it folds back on itself and courses back up the esophagus towards the mouth. Proximal side port of the tube is just below the level of the carina suggesting that the tip is positioned in the upper thoracic esophagus. Gas distended small bowel loops in the abdomen measure up to 4 cm diameter. IMPRESSION: NG tube passes to the distal esophagus where it folds back on itself and then extends  up the esophagus towards the mouth. NG tube should be repositioned. These results will be called to the ordering clinician or representative by the Radiologist Assistant, and communication documented in the PACS or [employer]. Electronically Signed   By: Kennith Center M.D.   On: 04/08/2022 06:52   CT ABDOMEN PELVIS W CONTRAST  Result Date: 04/08/2022 CLINICAL DATA:  Bowel obstruction suspected EXAM: CT ABDOMEN AND PELVIS WITH CONTRAST TECHNIQUE: Multidetector CT imaging of the abdomen and pelvis was performed using the standard protocol following bolus administration of intravenous  contrast. RADIATION DOSE REDUCTION: This exam was performed according to the departmental dose-optimization program which includes automated exposure control, adjustment of the mA and/or kV according to patient size and/or use of iterative reconstruction technique. CONTRAST:  OMNIPAQUE IOHEXOL 300 MG/ML  SOLN COMPARISON:  01/31/2021 FINDINGS: Lower chest:  Moderate sliding hiatal hernia Hepatobiliary: No focal liver abnormality.Mild biliary dilatation after cholecystectomy. Pancreas: Distorted pancreas from the degree of scoliosis. No focal finding or inflammatory changes Spleen: Unremarkable. Adrenals/Urinary Tract: Negative adrenals. No hydronephrosis or stone. High-density at the right renal collecting system is likely early contrast excretion based on 2020 comparison. Left renal cortical scarring to Temara Lanum moderate degree. Negative bladder. Stomach/Bowel: Dilated small bowel containing fluid levels leading to Freddy Spadafora transition point where there is distorted and twisted bowel loops/mesentery in the lower midline abdomen, just above an entero-enteric anastomosis. Distal loops are decompressed. Proximal small bowel loops are relatively decompressed but no proximal transition point or association with the twisted segment to imply closed loop obstruction. Especially based on reformats, some of the bowel at the level of twist and  anastomosis are edematous. No perforation. Vascular/Lymphatic: No acute vascular abnormality. Mild atheromatous calcification. No mass or adenopathy. Reproductive:No acute finding Other: Small volume ascites especially around the liver, also seen on prior. Musculoskeletal: Pronounced levoscoliosis with degeneration and L4-5 anterolisthesis. No acute abnormalities. IMPRESSION: High-grade small bowel obstruction due to twisted small bowel in the low abdomen, just before an enteroenteric anastomosis. The twisted segment and anastomosis are edematous. Moderate hiatal hernia. Electronically Signed   By: Tiburcio Pea M.D.   On: 04/08/2022 04:27        Scheduled Meds:  buprenorphine-naloxone  1 tablet Sublingual Daily   lidocaine  3 mL Mouth/Throat Once   Continuous Infusions:  lactated ringers 75 mL/hr at 04/09/22 0949     LOS: 1 day    Time spent: over 30 min    Lacretia Nicks, MD [facility]   To contact the attending provider between 7A-7P or the covering provider during after hours 7P-7A, please log into the web site [email or web address] and access using universal [place] password for that web site. If you do not have the password, please call the hospital operator.  04/09/2022, 4:15 PM

## 2022-04-09 NOTE — Progress Notes (Addendum)
Patient refused to take buprenorphine tablet, patient said that she cannot tolerate the tablet, "tastes terrible", claims that she threw up the one given to her in ED yesterday 04/08/22.  Patient added, "Give me either buprenorphine film or give me more morphine".  Dr. Lowell Guitar made aware.

## 2022-04-09 NOTE — Progress Notes (Signed)
Subjective: XR this morning shows contrast in colon, with decrease small bowel distension. Patient reports she is passing a little bit of flatus but no BM yet. NG output remains bilious. Patient endorses poor pain control this morning.   Objective: Vital signs in last 24 hours: Temp:  [98 F (36.7 C)-99.3 F (37.4 C)] 98 F (36.7 C) (09/09 0811) Pulse Rate:  [90-108] 91 (09/09 0811) Resp:  [10-20] 16 (09/09 0811) BP: (104-187)/(66-110) 104/66 (09/09 0811) SpO2:  [91 %-100 %] 91 % (09/09 0811) Weight:  [49.8 kg] 49.8 kg (09/08 2005) Last BM Date : 04/07/22  Intake/Output from previous day: 09/08 0701 - 09/09 0700 In: 832 [I.V.:832] Out: 200 [Urine:200] Intake/Output this shift: Total I/O In: 623.7 [I.V.:623.7] Out: 0   PE: General: resting comfortably, NAD Neuro: alert and oriented, no focal deficits HEENT: NG in place with bilious drainage Resp: normal work of breathing Abdomen: soft, nondistended, mildly tender to palpation.   Lab Results:  Recent Labs    04/08/22 0235  WBC 13.2*  HGB 12.5  HCT 41.0  PLT 342   BMET Recent Labs    04/08/22 0235  NA 138  K 4.5  CL 100  CO2 26  GLUCOSE 120*  BUN 17  CREATININE 0.74  CALCIUM 9.7   PT/INR No results for input(s): "LABPROT", "INR" in the last 72 hours. CMP     Component Value Date/Time   NA 138 04/08/2022 0235   NA 145 05/29/2017 0000   K 4.5 04/08/2022 0235   CL 100 04/08/2022 0235   CO2 26 04/08/2022 0235   GLUCOSE 120 (H) 04/08/2022 0235   BUN 17 04/08/2022 0235   BUN 23 (A) 05/29/2017 0000   CREATININE 0.74 04/08/2022 0235   CALCIUM 9.7 04/08/2022 0235   PROT 7.0 04/08/2022 0235   ALBUMIN 4.4 04/08/2022 0235   AST 32 04/08/2022 0235   ALT 18 04/08/2022 0235   ALKPHOS 58 04/08/2022 0235   BILITOT 0.7 04/08/2022 0235   GFRNONAA >60 04/08/2022 0235   GFRAA >60 04/06/2020 0113   Lipase     Component Value Date/Time   LIPASE 26 04/08/2022 0235       Studies/Results: DG Abd  Portable 1V-Small Bowel Obstruction Protocol-initial, 8 hr delay  Result Date: 04/09/2022 CLINICAL DATA:  Small-bowel obstruction. EXAM: PORTABLE ABDOMEN - 1 VIEW COMPARISON:  Abdomen film yesterday at 2:24 p.m., abdominopelvic CT yesterday at 3:48 a.m. FINDINGS: NGT folds over on itself in the gastric antrum and is kinked at the side hole, therefore may not be properly functioning. Contrast is now seen in the ascending colon moderate stool seen in the more distal colon. There is contrast opacifying the bladder as well. Dilated small bowel is no longer seen in the lower abdomen and pelvis but is still seen in the mid abdomen with segments up to 3.8 cm caliber. Motor severe levorotary lumbar scoliosis is redemonstrated. No supine evidence of free air. IMPRESSION: 1. Some improvement. Small bowel dilatation no longer seen in the lower abdomen and pelvis but still seen in the mid abdomen up to 3.8 cm. 2. Contrast now seen in the ascending colon but not seen in the more distal colon. 3. NGT folded back on itself at the gastric antrum and is kinked at the side hole. Clinical correlation. Electronically Signed   By: Almira Bar M.D.   On: 04/09/2022 07:43   DG Abd 1 View  Result Date: 04/08/2022 CLINICAL DATA:  NG tube placement EXAM:  ABDOMEN - 1 VIEW COMPARISON:  Same-day radiograph at 0938 hours FINDINGS: A single spot oblique film of the abdomen demonstrates a nasogastric tube tip and side port projecting over the expected location of the stomach. 0.2 minutes of fluoroscopic time was utilized with a dose of 1.5 mGy. IMPRESSION: Nasogastric tube with tip and side port projecting over the expected location of the stomach. Electronically Signed   By: Maudry Mayhew M.D.   On: 04/08/2022 15:06   DG Abdomen 1 View  Result Date: 04/08/2022 CLINICAL DATA:  NG tube adjustment EXAM: ABDOMEN - 1 VIEW COMPARISON:  04/08/2022 FINDINGS: A spot film and fluoroscopic screen save obtained during nasogastric tube adjustment  were submitted. Both images demonstrate NG tube distal tip and side port projecting below the diaphragms within the expected location of the gastric body. 1 minute 30 seconds of fluoroscopy time was utilized. IMPRESSION: NG tube positioning as described. Electronically Signed   By: Duanne Guess D.O.   On: 04/08/2022 10:03   DG Abd Portable 1V-Small Bowel Protocol-Position Verification  Addendum Date: 04/08/2022   ADDENDUM REPORT: 04/08/2022 08:31 ADDENDUM: These results were called by telephone at the time of interpretation on 04/08/2022 at 8:30 am to provider Dr. Ophelia Charter, who verbally acknowledged these results. Electronically Signed   By: Donzetta Kohut M.D.   On: 04/08/2022 08:31   Result Date: 04/08/2022 CLINICAL DATA:  Nasogastric tube post repositioning. EXAM: PORTABLE ABDOMEN - 1 VIEW COMPARISON:  Comparison made with imaging from April 08, 2022. FINDINGS: EKG leads project over the chest and abdomen. Gastric tube remains coiled likely in proximal stomach extending into the esophagus, tip directed towards the esophagus. Side port in similar position likely above the GE junction with tip above the side port in the distal esophagus. Visualized lung bases are clear. Bowel gas pattern still with gas-filled distended loops of bowel in the lower abdomen distended loops of small bowel better assessed on previous imaging studies. Urinary bladder is partially filled with excreted contrast media. IMPRESSION: 1. Gastric tube extending into the stomach with redundant loop, then ascending into the esophagus. Side port not changed with respect position and tip directed into the mid esophagus as on the previous radiograph, tip off the field of view. 2. Persistent small bowel distension in the lower abdomen grossly similar to previous imaging. Electronically Signed: By: Donzetta Kohut M.D. On: 04/08/2022 08:06   DG Abdomen 1 View  Result Date: 04/08/2022 CLINICAL DATA:  NG tube placement. EXAM: ABDOMEN - 1 VIEW  COMPARISON:  Earlier same day. FINDINGS: NG tube is again noted to be folded back on itself in the region of the diaphragmatic hiatus. Patient has a hiatal hernia on CT scan from earlier today and probably the tube is folded on itself in the hiatal hernia before traversing cranially back up the esophagus. The tip has not been included on the film but is probably in the upper thoracic esophagus. IMPRESSION: NG tube remains folded back on itself, likely secondary to the hiatal hernia. Tube extends cranially up the esophagus, with the tip not included on the film but probably in the upper thoracic esophagus. Electronically Signed   By: Kennith Center M.D.   On: 04/08/2022 08:10   DG Abdomen 1 View  Result Date: 04/08/2022 CLINICAL DATA:  NG tube placement. EXAM: ABDOMEN - 1 VIEW COMPARISON:  02/01/2021 FINDINGS: NG tube passes to the level of the distal esophagus where it folds back on itself and courses back up the esophagus towards the mouth. Proximal  side port of the tube is just below the level of the carina suggesting that the tip is positioned in the upper thoracic esophagus. Gas distended small bowel loops in the abdomen measure up to 4 cm diameter. IMPRESSION: NG tube passes to the distal esophagus where it folds back on itself and then extends up the esophagus towards the mouth. NG tube should be repositioned. These results will be called to the ordering clinician or representative by the Radiologist Assistant, and communication documented in the PACS or [employer]. Electronically Signed   By: Kennith Center M.D.   On: 04/08/2022 06:52   CT ABDOMEN PELVIS W CONTRAST  Result Date: 04/08/2022 CLINICAL DATA:  Bowel obstruction suspected EXAM: CT ABDOMEN AND PELVIS WITH CONTRAST TECHNIQUE: Multidetector CT imaging of the abdomen and pelvis was performed using the standard protocol following bolus administration of intravenous contrast. RADIATION DOSE REDUCTION: This exam was performed according to the  departmental dose-optimization program which includes automated exposure control, adjustment of the mA and/or kV according to patient size and/or use of iterative reconstruction technique. CONTRAST:  OMNIPAQUE IOHEXOL 300 MG/ML  SOLN COMPARISON:  01/31/2021 FINDINGS: Lower chest:  Moderate sliding hiatal hernia Hepatobiliary: No focal liver abnormality.Mild biliary dilatation after cholecystectomy. Pancreas: Distorted pancreas from the degree of scoliosis. No focal finding or inflammatory changes Spleen: Unremarkable. Adrenals/Urinary Tract: Negative adrenals. No hydronephrosis or stone. High-density at the right renal collecting system is likely early contrast excretion based on 2020 comparison. Left renal cortical scarring to a moderate degree. Negative bladder. Stomach/Bowel: Dilated small bowel containing fluid levels leading to a transition point where there is distorted and twisted bowel loops/mesentery in the lower midline abdomen, just above an entero-enteric anastomosis. Distal loops are decompressed. Proximal small bowel loops are relatively decompressed but no proximal transition point or association with the twisted segment to imply closed loop obstruction. Especially based on reformats, some of the bowel at the level of twist and anastomosis are edematous. No perforation. Vascular/Lymphatic: No acute vascular abnormality. Mild atheromatous calcification. No mass or adenopathy. Reproductive:No acute finding Other: Small volume ascites especially around the liver, also seen on prior. Musculoskeletal: Pronounced levoscoliosis with degeneration and L4-5 anterolisthesis. No acute abnormalities. IMPRESSION: High-grade small bowel obstruction due to twisted small bowel in the low abdomen, just before an enteroenteric anastomosis. The twisted segment and anastomosis are edematous. Moderate hiatal hernia. Electronically Signed   By: Tiburcio Pea M.D.   On: 04/08/2022 04:27     Anti-infectives: Anti-infectives (From admission, onward)    None        Assessment/Plan 66 yo female with a history of incarcerated inguinal hernia repair and small bowel resection, now with likely adhesive small bowel obstruction. She is on the SBO protocol and plain films show progression of contrast into the colon, indicated her obstruction is resolving. - Keep NG tube in today given bilious output and minimal bowel function. Anticipate return of bowel function soon given contrast in colon. - NPO, ok for ice chips - Abdominal pain - complicated by chronic pain. Abdomen is soft, no signs of peritonitis. Patient is on home suboxone, will increase prn morphine dose. - Surgery will follow    LOS: 1 day    Sophronia Simas, MD [facility] General, Hepatobiliary and Pancreatic Surgery 04/09/22 9:28 AM

## 2022-04-10 ENCOUNTER — Inpatient Hospital Stay (HOSPITAL_COMMUNITY): Payer: Medicare Other

## 2022-04-10 DIAGNOSIS — K56609 Unspecified intestinal obstruction, unspecified as to partial versus complete obstruction: Secondary | ICD-10-CM | POA: Diagnosis not present

## 2022-04-10 LAB — COMPREHENSIVE METABOLIC PANEL
ALT: 14 U/L (ref 0–44)
AST: 16 U/L (ref 15–41)
Albumin: 3 g/dL — ABNORMAL LOW (ref 3.5–5.0)
Alkaline Phosphatase: 38 U/L (ref 38–126)
Anion gap: 8 (ref 5–15)
BUN: 19 mg/dL (ref 8–23)
CO2: 29 mmol/L (ref 22–32)
Calcium: 8.3 mg/dL — ABNORMAL LOW (ref 8.9–10.3)
Chloride: 101 mmol/L (ref 98–111)
Creatinine, Ser: 0.7 mg/dL (ref 0.44–1.00)
GFR, Estimated: >60 mL/min (ref >60)
Glucose, Bld: 95 mg/dL (ref 70–99)
Potassium: 3.4 mmol/L — ABNORMAL LOW (ref 3.5–5.1)
Sodium: 138 mmol/L (ref 135–145)
Total Bilirubin: 0.4 mg/dL (ref 0.3–1.2)
Total Protein: 4.8 g/dL — ABNORMAL LOW (ref 6.5–8.1)

## 2022-04-10 LAB — CBC WITH DIFFERENTIAL/PLATELET
Abs Immature Granulocytes: 0.02 10*3/uL (ref 0.00–0.07)
Basophils Absolute: 0.1 10*3/uL (ref 0.0–0.1)
Basophils Relative: 1 %
Eosinophils Absolute: 0.3 10*3/uL (ref 0.0–0.5)
Eosinophils Relative: 5 %
HCT: 25.6 % — ABNORMAL LOW (ref 36.0–46.0)
Hemoglobin: 8.1 g/dL — ABNORMAL LOW (ref 12.0–15.0)
Immature Granulocytes: 0 %
Lymphocytes Relative: 20 %
Lymphs Abs: 1.2 10*3/uL (ref 0.7–4.0)
MCH: 23.7 pg — ABNORMAL LOW (ref 26.0–34.0)
MCHC: 31.6 g/dL (ref 30.0–36.0)
MCV: 74.9 fL — ABNORMAL LOW (ref 80.0–100.0)
Monocytes Absolute: 0.7 10*3/uL (ref 0.1–1.0)
Monocytes Relative: 13 %
Neutro Abs: 3.6 10*3/uL (ref 1.7–7.7)
Neutrophils Relative %: 61 %
Platelets: 220 10*3/uL (ref 150–400)
RBC: 3.42 MIL/uL — ABNORMAL LOW (ref 3.87–5.11)
RDW: 19.5 % — ABNORMAL HIGH (ref 11.5–15.5)
WBC: 5.8 10*3/uL (ref 4.0–10.5)
nRBC: 0 % (ref 0.0–0.2)

## 2022-04-10 LAB — URINALYSIS, ROUTINE W REFLEX MICROSCOPIC
Bilirubin Urine: NEGATIVE
Glucose, UA: NEGATIVE mg/dL
Hgb urine dipstick: NEGATIVE
Ketones, ur: 20 mg/dL — AB
Leukocytes,Ua: NEGATIVE
Nitrite: NEGATIVE
Protein, ur: NEGATIVE mg/dL
Specific Gravity, Urine: 1.03 (ref 1.005–1.030)
pH: 6 (ref 5.0–8.0)

## 2022-04-10 MED ORDER — OXYCODONE HCL 5 MG PO TABS
5.0000 mg | ORAL_TABLET | ORAL | Status: DC | PRN
  Administered 2022-04-10: 5 mg via ORAL
  Filled 2022-04-10: qty 1

## 2022-04-10 MED ORDER — MORPHINE SULFATE (PF) 2 MG/ML IV SOLN
2.0000 mg | INTRAVENOUS | Status: DC | PRN
  Administered 2022-04-10 – 2022-04-11 (×4): 4 mg via INTRAVENOUS
  Filled 2022-04-10 (×2): qty 2
  Filled 2022-04-10: qty 1
  Filled 2022-04-10 (×2): qty 2

## 2022-04-10 NOTE — Progress Notes (Addendum)
Disconnected NGT from wall suction at 1050 and clamped NGT per order. Educated patient to call if she feels worse. Will continue to monitor.

## 2022-04-10 NOTE — Progress Notes (Addendum)
PROGRESS NOTE    [name]  NWG:[id number] DOB: August 10, 1955 DOA: 04/08/2022 PCP: Tollie Eth, NP  Chief Complaint  Patient presents with   Abdominal Pain    Brief Narrative:  Deborah [name] is Deborah [name] 66 y.o. female with medical history significant of chronic pain syndrome and anxiety/depression as well as prior h/o SBO presenting with abdominal pain. She started with abdominal pain yesterday AM.  She had surgery for Millisa Giarrusso blockage Cartel Mauss year ago last August.  She never had any problems before that.  She is reluctant to answer questions and generally cantankerous, requesting ice chips and pain medications.   Her last admission was from 7/3-6/22 for partial SBO.  She had an NG tube placed and progressed without need for intervention.  Her admission prior to that was for SBO from 8/29-04/07/20.  She underwent L inguinal hernia repair with small bowel resection x 1.       ER Course:  Carryover, per Dr. Margo Aye:   66 year old female with history of chronic pain syndrome, chronic anxiety/depression, prior bowel obstruction presents with abdominal pain.  Onset yesterday with severe diffuse abdominal pain.  Associated with nausea and nonbloody emesis pain.  She reports decreased stool output.  She suspects she has another bowel obstruction.  This was confirmed on CT scan.  General surgery consulted and will see in consultation.     Assessment & Plan:   Active Problems:   MDD (major depressive disorder), recurrent severe, without psychosis (HCC)   Chronic pain syndrome   Small bowel obstruction (HCC)   Intestinal adhesions with complete obstruction (HCC)   Assessment and Plan: SBO - cT with high grade small bowel obstruction due to twisted small bowel in the low abdomen, just before an enteroenteric anastomosis - NGT to LIS - contrast seen in ascending colon on abd film this AM - appreciate surgery assistance - NGT clamped per surgery, diet per surgery - currently on IV pain meds    Anemia - suspect this is dilutional, but notable drop - will repeat in afternoon with anemia labs  Chronic pain syndrome -I have reviewed this patient in the [place] Controlled Substances Reporting System.  She is receiving medications from multiple providers. -unfortunately she's not tolerating suboxone tablets due to taste, needs refill of her sublingual tablets -has IV pain meds ordered while she's unable to take PO  -will transition to PO pain meds when able, she'll need to follow up with her primary pain med prescriber for refills    Mood d/o -Hold home meds - Adderall, Bupropion, Seroquel  -Suggest resumption of Bupropion and Seroquel when possible -Will add prn IV Haldol for now   DVT prophylaxis: SCD Code Status: dnr Family Communication: none Disposition:   Status is: Inpatient Remains inpatient appropriate because: continued management of SBO   Consultants:  General surgery  Procedures:  none  Antimicrobials:  Anti-infectives (From admission, onward)    None       Subjective: Anxious around pain  Objective: Vitals:   04/09/22 2047 04/10/22 0429 04/10/22 0754 04/10/22 1142  BP: (!) 105/59 98/64 (!) 95/59 98/60  Pulse: 82 75 67   Resp: 16 18 18    Temp: 97.9 F (36.6 C) (!) 97.5 F (36.4 C) 97.7 F (36.5 C)   TempSrc: Oral Oral Oral   SpO2: 93% 90% 90%   Weight:      Height:        Intake/Output Summary (Last 24 hours) at 04/10/2022 1357 Last data filed at 04/10/2022  6063 Gross per 24 hour  Intake 742.25 ml  Output 475 ml  Net 267.25 ml   Filed Weights   04/08/22 2005  Weight: 49.8 kg    Examination:  General: No acute distress. Cardiovascular: RRR Lungs: unlabored Abdomen: Soft, nontender - ng in place Neurological: Alert and oriented 3. Moves all extremities 4 with equal strength. Cranial nerves II through XII grossly intact. Extremities: No clubbing or cyanosis. No edema  Data Reviewed: I have personally reviewed following labs and  imaging studies  CBC: Recent Labs  Lab 04/08/22 0235 04/10/22 0829  WBC 13.2* 5.8  NEUTROABS  --  3.6  HGB 12.5 8.1*  HCT 41.0 25.6*  MCV 73.9* 74.9*  PLT 342 220    Basic Metabolic Panel: Recent Labs  Lab 04/08/22 0235 04/10/22 0829  NA 138 138  K 4.5 3.4*  CL 100 101  CO2 26 29  GLUCOSE 120* 95  BUN 17 19  CREATININE 0.74 0.70  CALCIUM 9.7 8.3*    GFR: Estimated Creatinine Clearance: 55.1 mL/min (by C-G formula based on SCr of 0.7 mg/dL).  Liver Function Tests: Recent Labs  Lab 04/08/22 0235 04/10/22 0829  AST 32 16  ALT 18 14  ALKPHOS 58 38  BILITOT 0.7 0.4  PROT 7.0 4.8*  ALBUMIN 4.4 3.0*    CBG: No results for input(s): "GLUCAP" in the last 168 hours.   No results found for this or any previous visit (from the past 240 hour(s)).       Radiology Studies: DG Abd 1 View  Result Date: 04/10/2022 CLINICAL DATA:  Small bowel obstruction. EXAM: ABDOMEN - 1 VIEW COMPARISON:  04/09/2022 and prior radiographs FINDINGS: An NG tube is noted with tip kinked in the mid to distal stomach. Contrast throughout the colon is now identified. No definite dilated gas-filled small bowel loops are identified on this study. Contrast in the bladder is again noted. IMPRESSION: 1. NG tube with tip kinked in the mid to distal stomach. 2. Contrast throughout the colon. No definite dilated gas-filled small bowel loops identified on this study. Electronically Signed   By: Harmon Pier M.D.   On: 04/10/2022 10:02   DG Abd Portable 1V-Small Bowel Obstruction Protocol-initial, 8 hr delay  Result Date: 04/09/2022 CLINICAL DATA:  Small-bowel obstruction. EXAM: PORTABLE ABDOMEN - 1 VIEW COMPARISON:  Abdomen film yesterday at 2:24 p.m., abdominopelvic CT yesterday at 3:48 Sarahy Creedon.m. FINDINGS: NGT folds over on itself in the gastric antrum and is kinked at the side hole, therefore may not be properly functioning. Contrast is now seen in the ascending colon moderate stool seen in the more distal  colon. There is contrast opacifying the bladder as well. Dilated small bowel is no longer seen in the lower abdomen and pelvis but is still seen in the mid abdomen with segments up to 3.8 cm caliber. Motor severe levorotary lumbar scoliosis is redemonstrated. No supine evidence of free air. IMPRESSION: 1. Some improvement. Small bowel dilatation no longer seen in the lower abdomen and pelvis but still seen in the mid abdomen up to 3.8 cm. 2. Contrast now seen in the ascending colon but not seen in the more distal colon. 3. NGT folded back on itself at the gastric antrum and is kinked at the side hole. Clinical correlation. Electronically Signed   By: Almira Bar M.D.   On: 04/09/2022 07:43   DG Abd 1 View  Result Date: 04/08/2022 CLINICAL DATA:  NG tube placement EXAM: ABDOMEN - 1 VIEW COMPARISON:  Same-day radiograph at [phone] hours FINDINGS: Damen Windsor single spot oblique film of the abdomen demonstrates Abigael Mogle nasogastric tube tip and side port projecting over the expected location of the stomach. 0.2 minutes of fluoroscopic time was utilized with Eman Morimoto dose of 1.5 mGy. IMPRESSION: Nasogastric tube with tip and side port projecting over the expected location of the stomach. Electronically Signed   By: Maudry Mayhew M.D.   On: 04/08/2022 15:06        Scheduled Meds:  buprenorphine-naloxone  1 tablet Sublingual Daily   lidocaine  3 mL Mouth/Throat Once   Continuous Infusions:  lactated ringers 75 mL/hr at 04/10/22 1320     LOS: 2 days    Time spent: over 30 min    Lacretia Nicks, MD [facility]   To contact the attending provider between 7A-7P or the covering provider during after hours 7P-7A, please log into the web site [email or web address] and access using universal [place] password for that web site. If you do not have the password, please call the hospital operator.  04/10/2022, 1:57 PM

## 2022-04-10 NOTE — Progress Notes (Signed)
Subjective: Patient denies passing any flatus since yesterday. No bowel movement. Continues to have pain, refusing SL suboxone because of the taste.   Objective: Vital signs in last 24 hours: Temp:  [97.5 F (36.4 C)-98.1 F (36.7 C)] 97.7 F (36.5 C) (09/10 0754) Pulse Rate:  [67-82] 67 (09/10 0754) Resp:  [16-18] 18 (09/10 0754) BP: (95-105)/(59-64) 95/59 (09/10 0754) SpO2:  [90 %-97 %] 90 % (09/10 0754) Last BM Date : 04/07/22  Intake/Output from previous day: 09/09 0701 - 09/10 0700 In: 1780.8 [P.O.:220; I.V.:1560.8] Out: 625 [Urine:400; Emesis/NG output:225] Intake/Output this shift: Total I/O In: 0  Out: 50 [Urine:50]  PE: General: resting comfortably, NAD Neuro: alert and oriented, no focal deficits HEENT: NG in place with gastric contents, nonbilious Resp: normal work of breathing Abdomen: soft, nondistended, nontender to palpation   Lab Results:  Recent Labs    04/08/22 0235 04/10/22 0829  WBC 13.2* 5.8  HGB 12.5 8.1*  HCT 41.0 25.6*  PLT 342 220   BMET Recent Labs    04/08/22 0235 04/10/22 0829  NA 138 138  K 4.5 3.4*  CL 100 101  CO2 26 29  GLUCOSE 120* 95  BUN 17 19  CREATININE 0.74 0.70  CALCIUM 9.7 8.3*   PT/INR No results for input(s): "LABPROT", "INR" in the last 72 hours. CMP     Component Value Date/Time   NA 138 04/10/2022 0829   NA 145 05/29/2017 0000   K 3.4 (L) 04/10/2022 0829   CL 101 04/10/2022 0829   CO2 29 04/10/2022 0829   GLUCOSE 95 04/10/2022 0829   BUN 19 04/10/2022 0829   BUN 23 (A) 05/29/2017 0000   CREATININE 0.70 04/10/2022 0829   CALCIUM 8.3 (L) 04/10/2022 0829   PROT 4.8 (L) 04/10/2022 0829   ALBUMIN 3.0 (L) 04/10/2022 0829   AST 16 04/10/2022 0829   ALT 14 04/10/2022 0829   ALKPHOS 38 04/10/2022 0829   BILITOT 0.4 04/10/2022 0829   GFRNONAA >60 04/10/2022 0829   GFRAA >60 04/06/2020 0113   Lipase     Component Value Date/Time   LIPASE 26 04/08/2022 0235       Studies/Results: DG  Abd Portable 1V-Small Bowel Obstruction Protocol-initial, 8 hr delay  Result Date: 04/09/2022 CLINICAL DATA:  Small-bowel obstruction. EXAM: PORTABLE ABDOMEN - 1 VIEW COMPARISON:  Abdomen film yesterday at 2:24 p.m., abdominopelvic CT yesterday at 3:48 a.m. FINDINGS: NGT folds over on itself in the gastric antrum and is kinked at the side hole, therefore may not be properly functioning. Contrast is now seen in the ascending colon moderate stool seen in the more distal colon. There is contrast opacifying the bladder as well. Dilated small bowel is no longer seen in the lower abdomen and pelvis but is still seen in the mid abdomen with segments up to 3.8 cm caliber. Motor severe levorotary lumbar scoliosis is redemonstrated. No supine evidence of free air. IMPRESSION: 1. Some improvement. Small bowel dilatation no longer seen in the lower abdomen and pelvis but still seen in the mid abdomen up to 3.8 cm. 2. Contrast now seen in the ascending colon but not seen in the more distal colon. 3. NGT folded back on itself at the gastric antrum and is kinked at the side hole. Clinical correlation. Electronically Signed   By: Almira Bar M.D.   On: 04/09/2022 07:43   DG Abd 1 View  Result Date: 04/08/2022 CLINICAL DATA:  NG tube placement EXAM: ABDOMEN -  1 VIEW COMPARISON:  Same-day radiograph at 0938 hours FINDINGS: A single spot oblique film of the abdomen demonstrates a nasogastric tube tip and side port projecting over the expected location of the stomach. 0.2 minutes of fluoroscopic time was utilized with a dose of 1.5 mGy. IMPRESSION: Nasogastric tube with tip and side port projecting over the expected location of the stomach. Electronically Signed   By: Maudry Mayhew M.D.   On: 04/08/2022 15:06    Anti-infectives: Anti-infectives (From admission, onward)    None        Assessment/Plan 66 yo female with a history of incarcerated inguinal hernia repair and small bowel resection, now with likely adhesive  small bowel obstruction. She is on the SBO protocol and plain films show progression of contrast into the colon, indicated her obstruction is resolving. She is not yet having regular bowel function, but repeat KUB this morning shows progression of contrast into the distal colon, with no visible small bowel distension. Abdomen is very soft and nontender, and NG is no longer bilious. - Clamp NG this morning, if patient tolerates will remove and advance to clear liquids - Abdominal pain - complicated by chronic pain. On home suboxone but is refusing it today because of the taste and normally eats when taking it. Hopefully NG can be removed to allow her to continue her home regimen and improve pain control. She is also on prn morphine. - Surgery will follow. No indication for acute surgical intervention.    LOS: 2 days    Sophronia Simas, MD [facility] General, Hepatobiliary and Pancreatic Surgery 04/10/22 10:03 AM

## 2022-04-10 NOTE — Plan of Care (Signed)
  Problem: Education: Goal: Knowledge of General Education information will improve Description: Including pain rating scale, medication(s)/side effects and non-pharmacologic comfort measures Outcome: Not Progressing   Problem: Health Behavior/Discharge Planning: Goal: Ability to manage health-related needs will improve Outcome: Not Progressing   Problem: Clinical Measurements: Goal: Cardiovascular complication will be avoided Outcome: Progressing   Problem: Activity: Goal: Risk for activity intolerance will decrease Outcome: Not Progressing   Problem: Nutrition: Goal: Adequate nutrition will be maintained Outcome: Not Progressing   Problem: Coping: Goal: Level of anxiety will decrease Outcome: Not Progressing   Problem: Elimination: Goal: Will not experience complications related to bowel motility Outcome: Not Progressing Goal: Will not experience complications related to urinary retention Outcome: Not Met (add Reason)   Problem: Pain Managment: Goal: General experience of comfort will improve Outcome: Not Progressing   Problem: Safety: Goal: Ability to remain free from injury will improve Outcome: Progressing   Problem: Skin Integrity: Goal: Risk for impaired skin integrity will decrease Outcome: Progressing

## 2022-04-10 NOTE — Progress Notes (Signed)
In & out cath drained 950, Md notified

## 2022-04-11 ENCOUNTER — Ambulatory Visit (HOSPITAL_BASED_OUTPATIENT_CLINIC_OR_DEPARTMENT_OTHER): Payer: Medicare Other | Admitting: Physical Therapy

## 2022-04-11 ENCOUNTER — Ambulatory Visit (HOSPITAL_BASED_OUTPATIENT_CLINIC_OR_DEPARTMENT_OTHER): Payer: Medicare Other | Admitting: Nurse Practitioner

## 2022-04-11 DIAGNOSIS — K56609 Unspecified intestinal obstruction, unspecified as to partial versus complete obstruction: Secondary | ICD-10-CM | POA: Diagnosis not present

## 2022-04-11 LAB — URINALYSIS, ROUTINE W REFLEX MICROSCOPIC
Bilirubin Urine: NEGATIVE
Glucose, UA: NEGATIVE mg/dL
Hgb urine dipstick: NEGATIVE
Ketones, ur: 20 mg/dL — AB
Nitrite: POSITIVE — AB
Protein, ur: 30 mg/dL — AB
Specific Gravity, Urine: 1.029 (ref 1.005–1.030)
pH: 8 (ref 5.0–8.0)

## 2022-04-11 LAB — CBC WITH DIFFERENTIAL/PLATELET
Abs Immature Granulocytes: 0.03 10*3/uL (ref 0.00–0.07)
Basophils Absolute: 0 10*3/uL (ref 0.0–0.1)
Basophils Relative: 1 %
Eosinophils Absolute: 0.2 10*3/uL (ref 0.0–0.5)
Eosinophils Relative: 3 %
HCT: 28.9 % — ABNORMAL LOW (ref 36.0–46.0)
Hemoglobin: 8.7 g/dL — ABNORMAL LOW (ref 12.0–15.0)
Immature Granulocytes: 1 %
Lymphocytes Relative: 19 %
Lymphs Abs: 1.1 10*3/uL (ref 0.7–4.0)
MCH: 22.8 pg — ABNORMAL LOW (ref 26.0–34.0)
MCHC: 30.1 g/dL (ref 30.0–36.0)
MCV: 75.9 fL — ABNORMAL LOW (ref 80.0–100.0)
Monocytes Absolute: 0.7 10*3/uL (ref 0.1–1.0)
Monocytes Relative: 11 %
Neutro Abs: 4 10*3/uL (ref 1.7–7.7)
Neutrophils Relative %: 65 %
Platelets: 221 10*3/uL (ref 150–400)
RBC: 3.81 MIL/uL — ABNORMAL LOW (ref 3.87–5.11)
RDW: 19.2 % — ABNORMAL HIGH (ref 11.5–15.5)
WBC: 6.1 10*3/uL (ref 4.0–10.5)
nRBC: 0 % (ref 0.0–0.2)

## 2022-04-11 LAB — COMPREHENSIVE METABOLIC PANEL
ALT: 13 U/L (ref 0–44)
AST: 16 U/L (ref 15–41)
Albumin: 3 g/dL — ABNORMAL LOW (ref 3.5–5.0)
Alkaline Phosphatase: 44 U/L (ref 38–126)
Anion gap: 8 (ref 5–15)
BUN: 17 mg/dL (ref 8–23)
CO2: 27 mmol/L (ref 22–32)
Calcium: 8 mg/dL — ABNORMAL LOW (ref 8.9–10.3)
Chloride: 103 mmol/L (ref 98–111)
Creatinine, Ser: 0.67 mg/dL (ref 0.44–1.00)
GFR, Estimated: >60 mL/min (ref >60)
Glucose, Bld: 94 mg/dL (ref 70–99)
Potassium: 3.1 mmol/L — ABNORMAL LOW (ref 3.5–5.1)
Sodium: 138 mmol/L (ref 135–145)
Total Bilirubin: 0.3 mg/dL (ref 0.3–1.2)
Total Protein: 5 g/dL — ABNORMAL LOW (ref 6.5–8.1)

## 2022-04-11 LAB — MAGNESIUM: Magnesium: 1.9 mg/dL (ref 1.7–2.4)

## 2022-04-11 LAB — PHOSPHORUS: Phosphorus: 2.3 mg/dL — ABNORMAL LOW (ref 2.5–4.6)

## 2022-04-11 MED ORDER — PANTOPRAZOLE SODIUM 40 MG PO TBEC
40.0000 mg | DELAYED_RELEASE_TABLET | Freq: Every day | ORAL | Status: DC
  Administered 2022-04-11 – 2022-04-13 (×3): 40 mg via ORAL
  Filled 2022-04-11 (×3): qty 1

## 2022-04-11 MED ORDER — POTASSIUM CHLORIDE 20 MEQ PO PACK
40.0000 meq | PACK | Freq: Once | ORAL | Status: AC
  Administered 2022-04-11: 40 meq via ORAL
  Filled 2022-04-11: qty 2

## 2022-04-11 MED ORDER — MORPHINE SULFATE (PF) 2 MG/ML IV SOLN
2.0000 mg | Freq: Once | INTRAVENOUS | Status: AC
  Administered 2022-04-11: 2 mg via INTRAVENOUS

## 2022-04-11 MED ORDER — ALUM & MAG HYDROXIDE-SIMETH 200-200-20 MG/5ML PO SUSP
30.0000 mL | Freq: Four times a day (QID) | ORAL | Status: DC | PRN
Start: 2022-04-11 — End: 2022-04-13
  Administered 2022-04-11: 30 mL via ORAL
  Filled 2022-04-11: qty 30

## 2022-04-11 MED ORDER — MORPHINE SULFATE (PF) 2 MG/ML IV SOLN
2.0000 mg | Freq: Three times a day (TID) | INTRAVENOUS | Status: DC | PRN
  Administered 2022-04-11 – 2022-04-12 (×2): 4 mg via INTRAVENOUS
  Filled 2022-04-11 (×2): qty 2

## 2022-04-11 MED ORDER — BUPROPION HCL ER (XL) 150 MG PO TB24
300.0000 mg | ORAL_TABLET | Freq: Every day | ORAL | Status: DC
  Administered 2022-04-11 – 2022-04-13 (×3): 300 mg via ORAL
  Filled 2022-04-11 (×3): qty 2

## 2022-04-11 MED ORDER — QUETIAPINE FUMARATE 50 MG PO TABS
25.0000 mg | ORAL_TABLET | Freq: Every day | ORAL | Status: DC
  Administered 2022-04-11 – 2022-04-12 (×2): 25 mg via ORAL
  Filled 2022-04-11 (×2): qty 1

## 2022-04-11 MED ORDER — OXYCODONE HCL 5 MG PO TABS
5.0000 mg | ORAL_TABLET | ORAL | Status: DC | PRN
  Administered 2022-04-11 – 2022-04-13 (×6): 10 mg via ORAL
  Filled 2022-04-11 (×6): qty 2

## 2022-04-11 MED ORDER — POTASSIUM CHLORIDE CRYS ER 20 MEQ PO TBCR
40.0000 meq | EXTENDED_RELEASE_TABLET | Freq: Once | ORAL | Status: AC
  Administered 2022-04-11: 40 meq via ORAL
  Filled 2022-04-11: qty 2

## 2022-04-11 NOTE — Progress Notes (Signed)
PT Cancellation Note  Patient Details Name: Deborah [name] MRN: [id number] DOB: [birth year]   Cancelled Treatment:    Reason Eval/Treat Not Completed: PT screened, no needs identified, will sign off;Other (comment) (Pt performing bed mobility, transfers, and gait at independent level. Pt reports that she is at her baseline with her mobility and does not need physical therapy. Pt declining to ambulate out of room (went short distance within room without AD). Please re-consult if pt's mobility status changes.)  Tana Coast, PT   Tana Coast 04/11/2022, 3:37 PM

## 2022-04-11 NOTE — Care Management Important Message (Signed)
Important Message  Patient Details  Name: Deborah [name] MRN: [id number] Date of Birth: 1956-01-14   Medicare Important Message Given:  Yes     Sherilyn Banker 04/11/2022, 12:01 PM

## 2022-04-11 NOTE — Progress Notes (Signed)
Subjective: CC: Reports she just had a large semi-solid bm. Passing flatus. Abdominal pain resolved. No n/v. Tolerating ice chips and sounds like small amount of clears.   Objective: Vital signs in last 24 hours: Temp:  [97.6 F (36.4 C)-98.6 F (37 C)] 97.6 F (36.4 C) (09/11 0727) Pulse Rate:  [69-74] 74 (09/11 0727) Resp:  [16-18] 16 (09/11 0727) BP: (98-122)/(60-79) 111/72 (09/11 0727) SpO2:  [91 %-96 %] 91 % (09/11 0727) Last BM Date : 04/07/22  Intake/Output from previous day: 09/10 0701 - 09/11 0700 In: 616.1 [I.V.:616.1] Out: 570 [Urine:570] Intake/Output this shift: No intake/output data recorded.  PE: Gen:  Alert, NAD Abd: Soft, flat, NT +BS  Lab Results:  Recent Labs    04/10/22 0829 04/11/22 0045  WBC 5.8 6.1  HGB 8.1* 8.7*  HCT 25.6* 28.9*  PLT 220 221   BMET Recent Labs    04/10/22 0829 04/11/22 0045  NA 138 138  K 3.4* 3.1*  CL 101 103  CO2 29 27  GLUCOSE 95 94  BUN 19 17  CREATININE 0.70 0.67  CALCIUM 8.3* 8.0*   PT/INR No results for input(s): "LABPROT", "INR" in the last 72 hours. CMP     Component Value Date/Time   NA 138 04/11/2022 0045   NA 145 05/29/2017 0000   K 3.1 (L) 04/11/2022 0045   CL 103 04/11/2022 0045   CO2 27 04/11/2022 0045   GLUCOSE 94 04/11/2022 0045   BUN 17 04/11/2022 0045   BUN 23 (A) 05/29/2017 0000   CREATININE 0.67 04/11/2022 0045   CALCIUM 8.0 (L) 04/11/2022 0045   PROT 5.0 (L) 04/11/2022 0045   ALBUMIN 3.0 (L) 04/11/2022 0045   AST 16 04/11/2022 0045   ALT 13 04/11/2022 0045   ALKPHOS 44 04/11/2022 0045   BILITOT 0.3 04/11/2022 0045   GFRNONAA >60 04/11/2022 0045   GFRAA >60 04/06/2020 0113   Lipase     Component Value Date/Time   LIPASE 26 04/08/2022 0235    Studies/Results: DG Abd 1 View  Result Date: 04/10/2022 CLINICAL DATA:  Small bowel obstruction. EXAM: ABDOMEN - 1 VIEW COMPARISON:  04/09/2022 and prior radiographs FINDINGS: An NG tube is noted with tip kinked in the mid to  distal stomach. Contrast throughout the colon is now identified. No definite dilated gas-filled small bowel loops are identified on this study. Contrast in the bladder is again noted. IMPRESSION: 1. NG tube with tip kinked in the mid to distal stomach. 2. Contrast throughout the colon. No definite dilated gas-filled small bowel loops identified on this study. Electronically Signed   By: Harmon Pier M.D.   On: 04/10/2022 10:02    Anti-infectives: Anti-infectives (From admission, onward)    None        Assessment/Plan SBO 66 yo female with a history of incarcerated inguinal hernia repair and small bowel resection, now with likely adhesive small bowel obstruction.  Appears to be clinically and radiographically resolving. Plain films show progression of contrast into the colon, she is now having bowel function and her symptoms have improved/resolved. Adv to FLD. Can advance diet as tolerated. If tolerating diet this afternoon, can be discharged from our standpoint.  FEN - FLD, ADAT, IVF per TRH. Recommend replacing K (3.1) VTE - SCDs, okay for chemical ppx from a general surgery standpoint ID - None Foley - I/O yesterday. ?UTI on UA. Defer to primary   LOS: 3 days    Jacinto Halim , PA-C  [facility] 04/11/2022, 9:03 AM Please see Amion for pager number during day hours 7:00am-4:30pm

## 2022-04-11 NOTE — Progress Notes (Signed)
PROGRESS NOTE    Deborah [name]  KGM:[id number] DOB: 14-Jun-1956 DOA: 04/08/2022 PCP: Tollie Eth, NP  Chief Complaint  Patient presents with   Abdominal Pain    Brief Narrative:  Deborah [name] is [name] 66 y.o. female with medical history significant of chronic pain syndrome and anxiety/depression as well as prior h/o SBO presenting with abdominal pain. She started with abdominal pain yesterday AM.  She had surgery for Stephania Macfarlane blockage Rosalena Mccorry year ago last August.  She never had any problems before that.  She is reluctant to answer questions and generally cantankerous, requesting ice chips and pain medications.   Her last admission was from 7/3-6/22 for partial SBO.  She had an NG tube placed and progressed without need for intervention.  Her admission prior to that was for SBO from 8/29-04/07/20.  She underwent L inguinal hernia repair with small bowel resection x 1.       ER Course:  Carryover, per Dr. Margo Aye:   66 year old female with history of chronic pain syndrome, chronic anxiety/depression, prior bowel obstruction presents with abdominal pain.  Onset yesterday with severe diffuse abdominal pain.  Associated with nausea and nonbloody emesis pain.  She reports decreased stool output.  She suspects she has another bowel obstruction.  This was confirmed on CT scan.  General surgery consulted and will see in consultation.     Assessment & Plan:   Active Problems:   MDD (major depressive disorder), recurrent severe, without psychosis (HCC)   Chronic pain syndrome   Small bowel obstruction (HCC)   Intestinal adhesions with complete obstruction (HCC)   Assessment and Plan: SBO - cT with high grade small bowel obstruction due to twisted small bowel in the low abdomen, just before an enteroenteric anastomosis - NGT to LIS - contrast seen in ascending colon on abd film this AM - appreciate surgery assistance - ADAT - working on transitioning off IV pain meds   Anemia - suspect this is  dilutional, but notable drop - anemia labs canceled yesterday? Repeat in AM  Urinary Retention - serial bladder scan, I&O as needed  Chronic pain syndrome -I have reviewed this patient in the [place] Controlled Substances Reporting System.  She is receiving medications from multiple providers. -unfortunately she's not tolerating suboxone tablets due to taste, needs refill of her sublingual tablets -has IV pain meds ordered while she's unable to take PO  -will transition to PO pain meds when able, she'll need to follow up with her primary pain med prescriber for refills    Intermittent right upper extremity numbness - unclear etiology, does have positive tinnels sign on R, but her description of symptoms not c/w carpal tunnel and would seem unusual for development of this for first time in hospital  - described as arm falling asleep, ? If positional in bed? - consider additional w/u if continued sx/recurrence   Mood d/o -Hold home meds - Adderall, Bupropion, Seroquel  -Suggest resumption of Bupropion and Seroquel when possible -Will add prn IV Haldol for now  GERD Chronic This is very concerning to her Will plan for GI referral outpatient     DVT prophylaxis: SCD Code Status: dnr Family Communication: none Disposition:   Status is: Inpatient Remains inpatient appropriate because: continued management of SBO   Consultants:  General surgery  Procedures:  none  Antimicrobials:  Anti-infectives (From admission, onward)    None       Subjective: C/o intermittent numbness to R arm C/o reflux   Objective:  Vitals:   04/10/22 1142 04/10/22 1636 04/10/22 2056 04/11/22 0727  BP: 98/60 122/79 109/63 111/72  Pulse:  69 74 74  Resp:  18 16 16   Temp:  98.6 F (37 C) 97.8 F (36.6 C) 97.6 F (36.4 C)  TempSrc:  Oral Oral Oral  SpO2:  96% 91% 91%  Weight:      Height:        Intake/Output Summary (Last 24 hours) at 04/11/2022 1832 Last data filed at 04/11/2022  0521 Gross per 24 hour  Intake --  Output 420 ml  Net -420 ml   Filed Weights   04/08/22 2005  Weight: 49.8 kg    Examination:  General: No acute distress. Cardiovascular: RRR Lungs: unlabored Abdomen: Soft, nontender, nondistended  Neurological: Alert and oriented 3. Moves all extremities 4 with equal strength to upper extremities grossly. Cranial nerves II through XII grossly intact. + tinnel's sign on R, negative phalens. Symmetric biceps tendon reflexes bilaterally.  Extremities: No clubbing or cyanosis. No edema.  Data Reviewed: I have personally reviewed following labs and imaging studies  CBC: Recent Labs  Lab 04/08/22 0235 04/10/22 0829 04/11/22 0045  WBC 13.2* 5.8 6.1  NEUTROABS  --  3.6 4.0  HGB 12.5 8.1* 8.7*  HCT 41.0 25.6* 28.9*  MCV 73.9* 74.9* 75.9*  PLT 342 220 [id number]    Basic Metabolic Panel: Recent Labs  Lab 04/08/22 0235 04/10/22 0829 04/11/22 0045  NA 138 138 138  K 4.5 3.4* 3.1*  CL 100 101 103  CO2 26 29 27   GLUCOSE 120* 95 94  BUN 17 19 17   CREATININE 0.74 0.70 0.67  CALCIUM 9.7 8.3* 8.0*  MG  --   --  1.9  PHOS  --   --  2.3*    GFR: Estimated Creatinine Clearance: 55.1 mL/min (by C-G formula based on SCr of 0.67 mg/dL).  Liver Function Tests: Recent Labs  Lab 04/08/22 0235 04/10/22 0829 04/11/22 0045  AST 32 16 16  ALT 18 14 13   ALKPHOS 58 38 44  BILITOT 0.7 0.4 0.3  PROT 7.0 4.8* 5.0*  ALBUMIN 4.4 3.0* 3.0*    CBG: No results for input(s): "GLUCAP" in the last 168 hours.   No results found for this or any previous visit (from the past 240 hour(s)).       Radiology Studies: DG Abd 1 View  Result Date: 04/10/2022 CLINICAL DATA:  Small bowel obstruction. EXAM: ABDOMEN - 1 VIEW COMPARISON:  04/09/2022 and prior radiographs FINDINGS: An NG tube is noted with tip kinked in the mid to distal stomach. Contrast throughout the colon is now identified. No definite dilated gas-filled small bowel loops are identified on  this study. Contrast in the bladder is again noted. IMPRESSION: 1. NG tube with tip kinked in the mid to distal stomach. 2. Contrast throughout the colon. No definite dilated gas-filled small bowel loops identified on this study. Electronically Signed   By: Margarette Canada M.D.   On: 04/10/2022 10:02        Scheduled Meds:  buprenorphine-naloxone  1 tablet Sublingual Daily   buPROPion  300 mg Oral Daily   pantoprazole  40 mg Oral Daily   QUEtiapine  25 mg Oral QHS   Continuous Infusions:     LOS: 3 days    Time spent: over 30 min    Fayrene Helper, MD [facility]   To contact the attending provider between 7A-7P or the covering provider during after hours 7P-7A, please log  into the web site [email or web address] and access using universal [place] password for that web site. If you do not have the password, please call the hospital operator.  04/11/2022, 6:32 PM

## 2022-04-11 NOTE — Progress Notes (Signed)
Pt stated that she takes 8.5mg  of suboxone at home and she will not take the subxone until she gets all of it like she takes at home MD notified.

## 2022-04-11 NOTE — Progress Notes (Signed)
Patient up to sit on the toilet. Patient able to pass flatus at this time. Patient is very upset that her pain is not well controlled. States that her pain has been worse since coming to the hospital.

## 2022-04-11 NOTE — TOC CM/SW Note (Signed)
  Transition of Care [facility]) Screening Note   Patient Details  Name: Deborah [name] Date of Birth: 11/01/1955     Transition of Care Department [facility]) has reviewed patient and no TOC needs have been identified at this time. We will continue to monitor patient advancement through interdisciplinary progression rounds. If new patient transition needs arise, please place a TOC consult.

## 2022-04-11 NOTE — Progress Notes (Addendum)
HOSPITAL MEDICINE OVERNIGHT EVENT NOTE    Patient complaining to nursing staff of persisting abdominal pain throughout the evening with associated nausea.  No associated vomiting however.  Apparently, patient denies being able to pass any flatus.  Nursing states abdomen is soft and nondistended.  Due to recurrent patient complaints we will give a one-time dose of 2 mg of intravenous morphine.  Deno Lunger Susumu Hackler  ADDENDUM (9/11 5:30AM)  Notified by nursing the patient has been unable to urinate for the past several hours.  Bladder scan performed revealing 450 cc in the bladder.  Patient is complaining of ongoing abdominal pain throughout the evening and while I do not think that the pain is attributable to this urinary retention and hopefully by relieving this it will provide her some comfort.  We will perform a one-time in and out catheterization.  We will go ahead and obtain a urinalysis to evaluate for any evidence of infection.  Marinda Elk  MD [facility]

## 2022-04-11 NOTE — Progress Notes (Signed)
Patient complaining of right arm hurting. Patient believes it happens when pain medication is administered. RN thoroughly assessed IV site, which appears normal with blood return in IV. RN removed IV per patient request and inserted new IV on the left arm before giving pain medication at midnight.

## 2022-04-12 ENCOUNTER — Ambulatory Visit: Payer: Medicare Other | Admitting: Family

## 2022-04-12 ENCOUNTER — Inpatient Hospital Stay (HOSPITAL_COMMUNITY): Payer: Medicare Other

## 2022-04-12 DIAGNOSIS — K56609 Unspecified intestinal obstruction, unspecified as to partial versus complete obstruction: Secondary | ICD-10-CM | POA: Diagnosis not present

## 2022-04-12 LAB — CBC WITH DIFFERENTIAL/PLATELET
Abs Immature Granulocytes: 0.03 10*3/uL (ref 0.00–0.07)
Basophils Absolute: 0 10*3/uL (ref 0.0–0.1)
Basophils Relative: 0 %
Eosinophils Absolute: 0.2 10*3/uL (ref 0.0–0.5)
Eosinophils Relative: 3 %
HCT: 30.2 % — ABNORMAL LOW (ref 36.0–46.0)
Hemoglobin: 9 g/dL — ABNORMAL LOW (ref 12.0–15.0)
Immature Granulocytes: 0 %
Lymphocytes Relative: 19 %
Lymphs Abs: 1.4 10*3/uL (ref 0.7–4.0)
MCH: 22.8 pg — ABNORMAL LOW (ref 26.0–34.0)
MCHC: 29.8 g/dL — ABNORMAL LOW (ref 30.0–36.0)
MCV: 76.5 fL — ABNORMAL LOW (ref 80.0–100.0)
Monocytes Absolute: 0.8 10*3/uL (ref 0.1–1.0)
Monocytes Relative: 11 %
Neutro Abs: 5 10*3/uL (ref 1.7–7.7)
Neutrophils Relative %: 67 %
Platelets: 220 10*3/uL (ref 150–400)
RBC: 3.95 MIL/uL (ref 3.87–5.11)
RDW: 19.2 % — ABNORMAL HIGH (ref 11.5–15.5)
WBC: 7.3 10*3/uL (ref 4.0–10.5)
nRBC: 0 % (ref 0.0–0.2)

## 2022-04-12 LAB — COMPREHENSIVE METABOLIC PANEL
ALT: 14 U/L (ref 0–44)
AST: 18 U/L (ref 15–41)
Albumin: 3.1 g/dL — ABNORMAL LOW (ref 3.5–5.0)
Alkaline Phosphatase: 49 U/L (ref 38–126)
Anion gap: 9 (ref 5–15)
BUN: 14 mg/dL (ref 8–23)
CO2: 24 mmol/L (ref 22–32)
Calcium: 8.4 mg/dL — ABNORMAL LOW (ref 8.9–10.3)
Chloride: 106 mmol/L (ref 98–111)
Creatinine, Ser: 0.75 mg/dL (ref 0.44–1.00)
GFR, Estimated: >60 mL/min (ref >60)
Glucose, Bld: 86 mg/dL (ref 70–99)
Potassium: 4.4 mmol/L (ref 3.5–5.1)
Sodium: 139 mmol/L (ref 135–145)
Total Bilirubin: 0.4 mg/dL (ref 0.3–1.2)
Total Protein: 5.2 g/dL — ABNORMAL LOW (ref 6.5–8.1)

## 2022-04-12 LAB — IRON AND TIBC
Iron: 17 ug/dL — ABNORMAL LOW (ref 28–170)
Saturation Ratios: 5 % — ABNORMAL LOW (ref 10.4–31.8)
TIBC: 379 ug/dL (ref 250–450)
UIBC: 362 ug/dL

## 2022-04-12 LAB — FOLATE: Folate: 33.9 ng/mL (ref >5.9)

## 2022-04-12 LAB — FERRITIN: Ferritin: 7 ng/mL — ABNORMAL LOW (ref 11–307)

## 2022-04-12 LAB — MAGNESIUM: Magnesium: 2 mg/dL (ref 1.7–2.4)

## 2022-04-12 LAB — VITAMIN B12: Vitamin B-12: 900 pg/mL (ref 180–914)

## 2022-04-12 LAB — PHOSPHORUS: Phosphorus: 2.2 mg/dL — ABNORMAL LOW (ref 2.5–4.6)

## 2022-04-12 MED ORDER — SODIUM CHLORIDE 0.9 % IV SOLN
250.0000 mg | Freq: Every day | INTRAVENOUS | Status: DC
  Administered 2022-04-13: 250 mg via INTRAVENOUS
  Filled 2022-04-12 (×2): qty 20

## 2022-04-12 MED ORDER — SODIUM CHLORIDE 0.9 % IV SOLN
1.0000 g | INTRAVENOUS | Status: DC
  Administered 2022-04-12: 1 g via INTRAVENOUS
  Filled 2022-04-12: qty 10

## 2022-04-12 MED ORDER — MELATONIN 5 MG PO TABS
5.0000 mg | ORAL_TABLET | Freq: Once | ORAL | Status: AC
  Administered 2022-04-12: 5 mg via ORAL
  Filled 2022-04-12: qty 1

## 2022-04-12 NOTE — Progress Notes (Signed)
       Subjective: Had a BM yesterday.  No nausea or vomiting with FLD yesterday.  Having a bad morning due to nightmares overnight.  Objective: Vital signs in last 24 hours: Temp:  [97.6 F (36.4 C)-98.1 F (36.7 C)] 97.6 F (36.4 C) (09/12 0431) Pulse Rate:  [66-76] 66 (09/12 0431) Resp:  [18] 18 (09/12 0431) BP: (102-116)/(66-74) 116/66 (09/12 0431) Last BM Date : 04/07/22  Intake/Output from previous day: No intake/output data recorded. Intake/Output this shift: No intake/output data recorded.  PE: Gen:  Alert, NAD Abd: Soft, flat, NT +BS  Lab Results:  Recent Labs    04/11/22 0045 04/12/22 0041  WBC 6.1 7.3  HGB 8.7* 9.0*  HCT 28.9* 30.2*  PLT 221 220   BMET Recent Labs    04/11/22 0045 04/12/22 0041  NA 138 139  K 3.1* 4.4  CL 103 106  CO2 27 24  GLUCOSE 94 86  BUN 17 14  CREATININE 0.67 0.75  CALCIUM 8.0* 8.4*   PT/INR No results for input(s): "LABPROT", "INR" in the last 72 hours. CMP     Component Value Date/Time   NA 139 04/12/2022 0041   NA 145 05/29/2017 0000   K 4.4 04/12/2022 0041   CL 106 04/12/2022 0041   CO2 24 04/12/2022 0041   GLUCOSE 86 04/12/2022 0041   BUN 14 04/12/2022 0041   BUN 23 (A) 05/29/2017 0000   CREATININE 0.75 04/12/2022 0041   CALCIUM 8.4 (L) 04/12/2022 0041   PROT 5.2 (L) 04/12/2022 0041   ALBUMIN 3.1 (L) 04/12/2022 0041   AST 18 04/12/2022 0041   ALT 14 04/12/2022 0041   ALKPHOS 49 04/12/2022 0041   BILITOT 0.4 04/12/2022 0041   GFRNONAA >60 04/12/2022 0041   GFRAA >60 04/06/2020 0113   Lipase     Component Value Date/Time   LIPASE 26 04/08/2022 0235    Studies/Results: DG Abd 1 View  Result Date: 04/10/2022 CLINICAL DATA:  Small bowel obstruction. EXAM: ABDOMEN - 1 VIEW COMPARISON:  04/09/2022 and prior radiographs FINDINGS: An NG tube is noted with tip kinked in the mid to distal stomach. Contrast throughout the colon is now identified. No definite dilated gas-filled small bowel loops are  identified on this study. Contrast in the bladder is again noted. IMPRESSION: 1. NG tube with tip kinked in the mid to distal stomach. 2. Contrast throughout the colon. No definite dilated gas-filled small bowel loops identified on this study. Electronically Signed   By: Harmon Pier M.D.   On: 04/10/2022 10:02    Anti-infectives: Anti-infectives (From admission, onward)    None        Assessment/Plan SBO 66 yo female with a history of incarcerated inguinal hernia repair and small bowel resection, now with likely adhesive small bowel obstruction.  -clinically resolving. -on soft diet, if tolerates this is surgically stable for dc home today. -message sent to primary service.  FEN - soft diet VTE - SCDs, okay for chemical ppx from a general surgery standpoint ID - None Foley - I/O yesterday. ?UTI on UA. Defer to primary   LOS: 4 days    Letha Cape , [facility] Surgery 04/12/2022, 8:45 AM Please see Amion for pager number during day hours 7:00am-4:30pm

## 2022-04-12 NOTE — Progress Notes (Signed)
PROGRESS NOTE    Deborah [name]  RCV:[id number] DOB: 03/17/1956 DOA: 04/08/2022 PCP: Tollie Eth, NP  Chief Complaint  Patient presents with   Abdominal Pain    Brief Narrative:  Deborah [name] is Trevar Boehringer 67 y.o. female with medical history significant of chronic pain syndrome and anxiety/depression as well as prior h/o SBO presenting with abdominal pain. She started with abdominal pain yesterday AM.  She had surgery for Deborah [name] blockage Rhyleigh Grassel year ago last August.  She never had any problems before that.  CT at admission concerning for SBO.  Surgery was c/s, NGT placed.  SBO now resolved, stable for discharge from surgical standpoint.  Pending workup for intermittent numbness of R hand which began during this admission.  Discharge pending results  See below for additional details     Assessment & Plan:   Active Problems:   MDD (major depressive disorder), recurrent severe, without psychosis (HCC)   Chronic pain syndrome   Small bowel obstruction (HCC)   Intestinal adhesions with complete obstruction (HCC)   Assessment and Plan: SBO - CT with high grade small bowel obstruction due to twisted small bowel in the low abdomen, just before an enteroenteric anastomosis - contrast seen in ascending colon on abd film - appreciate surgery assistance - surgically stable for d/c - IV pain meds d/c'd   Anemia - suspect this is dilutional, but notable drop - iron def anemia - IV iron ordered  Urinary Retention  Pyuria  Dysuria - UA concerning for UTI - follow urine culture - ceftriaxone  Chronic pain syndrome -I have reviewed this patient in the [place] Controlled Substances Reporting System.  She is receiving medications from multiple providers. -unfortunately she's not tolerating suboxone tablets on formulary due to taste here, needs refill of her sublingual tablets so can't use meds from home -PO oxycodone ordered for now, will need to get refills from her pain med provider after  discharge (of suboxone)   Intermittent right upper extremity numbness - unclear etiology, located to R hand in Jessenya Berdan glove like distribution.  Comes and goes, associated with pain.  does have positive tinnels sign on R, but her description of symptoms not c/w carpal tunnel and would seem unusual for development of this for first time in hospital  - described as arm falling asleep, ? If positional in bed? - follow MRI brain/MRA head/neck  Mood d/o -Hold home meds - Adderall, Bupropion, Seroquel  -seroquel, bupropion  GERD Chronic This is very concerning to her Will plan for GI referral outpatient     DVT prophylaxis: SCD Code Status: dnr Family Communication: none Disposition:   Status is: Inpatient Remains inpatient appropriate because: continued management of SBO   Consultants:  General surgery  Procedures:  none  Antimicrobials:  Anti-infectives (From admission, onward)    Start     Dose/Rate Route Frequency Ordered Stop   04/12/22 1500  cefTRIAXone (ROCEPHIN) 1 g in sodium chloride 0.9 % 100 mL IVPB        1 g 200 mL/hr over 30 Minutes Intravenous Every 24 hours 04/12/22 1402         Subjective: Continues to c/o numbness to R arm that is intermittent Distribution is like Nahome Bublitz glove  Objective: Vitals:   04/11/22 0727 04/11/22 2051 04/12/22 0431 04/12/22 0924  BP: 111/72 102/74 116/66 124/75  Pulse: 74 76 66 73  Resp: 16 18 18 17   Temp: 97.6 F (36.4 C) 98.1 F (36.7 C) 97.6 F (36.4 C) 97.8  F (36.6 C)  TempSrc: Oral Oral Oral Oral  SpO2: 91%   96%  Weight:      Height:        Intake/Output Summary (Last 24 hours) at 04/12/2022 1611 Last data filed at 04/12/2022 1300 Gross per 24 hour  Intake --  Output 150 ml  Net -150 ml   Filed Weights   04/08/22 2005  Weight: 49.8 kg    Examination:  General: No acute distress. Cardiovascular: RRR Lungs: unlabored Abdomen: Soft, nontender, nondistended Neurological: Alert and oriented 3. Moves all  extremities 4 with equal strength. Cranial nerves II through XII grossly intact. Extremities: No clubbing or cyanosis. No edema.  Data Reviewed: I have personally reviewed following labs and imaging studies  CBC: Recent Labs  Lab 04/08/22 0235 04/10/22 0829 04/11/22 0045 04/12/22 0041  WBC 13.2* 5.8 6.1 7.3  NEUTROABS  --  3.6 4.0 5.0  HGB 12.5 8.1* 8.7* 9.0*  HCT 41.0 25.6* 28.9* 30.2*  MCV 73.9* 74.9* 75.9* 76.5*  PLT 342 220 221 220    Basic Metabolic Panel: Recent Labs  Lab 04/08/22 0235 04/10/22 0829 04/11/22 0045 04/12/22 0041  NA 138 138 138 139  K 4.5 3.4* 3.1* 4.4  CL 100 101 103 106  CO2 26 29 27 24   GLUCOSE 120* 95 94 86  BUN 17 19 17 14   CREATININE 0.74 0.70 0.67 0.75  CALCIUM 9.7 8.3* 8.0* 8.4*  MG  --   --  1.9 2.0  PHOS  --   --  2.3* 2.2*    GFR: Estimated Creatinine Clearance: 55.1 mL/min (by C-G formula based on SCr of 0.75 mg/dL).  Liver Function Tests: Recent Labs  Lab 04/08/22 0235 04/10/22 0829 04/11/22 0045 04/12/22 0041  AST 32 16 16 18   ALT 18 14 13 14   ALKPHOS 58 38 44 49  BILITOT 0.7 0.4 0.3 0.4  PROT 7.0 4.8* 5.0* 5.2*  ALBUMIN 4.4 3.0* 3.0* 3.1*    CBG: No results for input(s): "GLUCAP" in the last 168 hours.   No results found for this or any previous visit (from the past 240 hour(s)).       Radiology Studies: No results found.      Scheduled Meds:  buprenorphine-naloxone  1 tablet Sublingual Daily   buPROPion  300 mg Oral Daily   pantoprazole  40 mg Oral Daily   QUEtiapine  25 mg Oral QHS   Continuous Infusions:  cefTRIAXone (ROCEPHIN)  IV     ferric gluconate (FERRLECIT) IVPB 250 mg (04/12/22 1427)      LOS: 4 days    Time spent: over 30 min    06/12/22, MD [facility]   To contact the attending provider between 7A-7P or the covering provider during after hours 7P-7A, please log into the web site [email or web address] and access using universal [place] password for that web  site. If you do not have the password, please call the hospital operator.  04/12/2022, 4:11 PM

## 2022-04-13 DIAGNOSIS — K56609 Unspecified intestinal obstruction, unspecified as to partial versus complete obstruction: Secondary | ICD-10-CM | POA: Diagnosis not present

## 2022-04-13 DIAGNOSIS — K5652 Intestinal adhesions [bands] with complete obstruction: Secondary | ICD-10-CM | POA: Diagnosis not present

## 2022-04-13 DIAGNOSIS — G894 Chronic pain syndrome: Secondary | ICD-10-CM | POA: Diagnosis not present

## 2022-04-13 DIAGNOSIS — F332 Major depressive disorder, recurrent severe without psychotic features: Secondary | ICD-10-CM | POA: Diagnosis not present

## 2022-04-13 MED ORDER — ENSURE ENLIVE PO LIQD: 237.0000 mL | Freq: Two times a day (BID) | ORAL | Status: DC

## 2022-04-13 MED ORDER — FERROUS SULFATE 325 (65 FE) MG PO TBEC: 325.0000 mg | DELAYED_RELEASE_TABLET | Freq: Two times a day (BID) | ORAL | 3 refills | Status: DC

## 2022-04-13 MED ORDER — CIPROFLOXACIN HCL 500 MG PO TABS: 500.0000 mg | ORAL_TABLET | Freq: Two times a day (BID) | ORAL | 0 refills | Status: AC

## 2022-04-13 NOTE — Discharge Summary (Signed)
Physician Discharge Summary  Deborah [name] NLZ:[id number] DOB: 1956-07-10 DOA: 04/08/2022  PCP: Tollie Eth, NP  Admit date: 04/08/2022 Discharge date: 04/13/2022  Admitted From: Home Disposition:  Home  Recommendations for Outpatient Follow-up:  Follow up with PCP in 1-2 weeks Please obtain BMP/CBC in one week  Home Health:None  Equipment/Devices:None  Discharge Condition:stable  CODE STATUS:Full  Diet recommendation: As tolerated    Brief/Interim Summary: Deborah [name] is a 66 y.o. female with medical history significant of chronic pain syndrome and anxiety/depression as well as prior h/o SBO presenting with abdominal pain. She started with abdominal pain yesterday AM.  She had surgery for a blockage a year ago last August.  She never had any problems before that.  CT at admission concerning for SBO.  Surgery was c/s, NGT placed.  SBO now resolved, stable for discharge from surgical standpoint.  Pending workup for intermittent numbness of R hand which began during this admission. MRI/MRA negative for acute findings, otherwise stable for discharge. Complete cipro at discharge for presumed UTI(POA)  Discharge Diagnoses:  Active Problems:   MDD (major depressive disorder), recurrent severe, without psychosis (HCC)   Chronic pain syndrome   Small bowel obstruction (HCC)   Intestinal adhesions with complete obstruction [facility])    Discharge Instructions   Allergies as of 04/13/2022       Reactions   Erythromycin Other (See Comments)   Unknown reaction    Vancomycin Other (See Comments)   Developed 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 (so if needed again, infuse slower)        Medication List     STOP taking these medications    amphetamine-dextroamphetamine 30 MG tablet Commonly known as: ADDERALL   buPROPion 300 MG 24 hr tablet Commonly known as: WELLBUTRIN XL   CALCIUM PO   famotidine 40 MG tablet Commonly known as: PEPCID   ibuprofen 800 MG tablet Commonly known as:  ADVIL   linaclotide 145 MCG Caps capsule Commonly known as: LINZESS   multivitamin capsule   omeprazole 40 MG capsule Commonly known as: PRILOSEC   oxyCODONE-acetaminophen 10-325 MG tablet Commonly known as: PERCOCET   oxyCODONE-acetaminophen 5-325 MG tablet Commonly known as: PERCOCET/ROXICET   QUEtiapine 25 MG tablet Commonly known as: SEROQUEL   Retin-A Micro 0.1 % gel Generic drug: tretinoin microspheres   Vitamin D (Ergocalciferol) 1.25 MG (50000 UNIT) Caps capsule Commonly known as: DRISDOL       TAKE these medications    Buprenorphine HCl-Naloxone HCl 8-2 MG Film Place under the tongue 3 (three) times daily.   ciprofloxacin 500 MG tablet Commonly known as: Cipro Take 1 tablet (500 mg total) by mouth 2 (two) times daily for 2 days.   ferrous sulfate 325 (65 FE) MG EC tablet Take 1 tablet (325 mg total) by mouth 2 (two) times daily.        Allergies  Allergen Reactions   Erythromycin Other (See Comments)    Unknown reaction    Vancomycin Other (See Comments)    Developed 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 (so if needed again, infuse slower)     Consultations: None   Procedures/Studies: MR BRAIN WO CONTRAST  Result Date: 04/12/2022 CLINICAL DATA:  Acute neurologic deficits EXAM: MRI HEAD WITHOUT CONTRAST MRA HEAD WITHOUT CONTRAST TECHNIQUE: Multiplanar, multi-echo pulse sequences of the brain and surrounding structures were acquired without intravenous contrast. Angiographic images of the Circle of Willis were acquired using MRA technique without intravenous contrast. COMPARISON:  None Available. FINDINGS: MRI HEAD FINDINGS Brain: No acute infarct,  mass effect or extra-axial collection. Bifrontal encephalomalacia with chronic mild siderosis. There is multifocal hyperintense T2-weighted signal within the white matter. Generalized volume loss. The midline structures are normal. Vascular: Major flow voids are preserved. Skull and upper cervical spine: Normal calvarium and  skull base. Visualized upper cervical spine and soft tissues are normal. Sinuses/Orbits:No paranasal sinus fluid levels or advanced mucosal thickening. No mastoid or middle ear effusion. Normal orbits. MRA HEAD FINDINGS POSTERIOR CIRCULATION: --Vertebral arteries: Normal --Inferior cerebellar arteries: Normal. --Basilar artery: Normal. --Superior cerebellar arteries: Normal. --Posterior cerebral arteries: Normal. ANTERIOR CIRCULATION: --Intracranial internal carotid arteries: Normal. --Anterior cerebral arteries (ACA): Normal. --Middle cerebral arteries (MCA): Normal. ANATOMIC VARIANTS: Both posterior communicating arteries are patent. IMPRESSION: 1. No acute intracranial abnormality. 2. Bifrontal encephalomalacia with chronic mild siderosis, consistent with remote trauma. 3. Normal intracranial MRA. Electronically Signed   By: Deatra RobinsonKevin  Herman M.D.   On: 04/12/2022 20:49   MR ANGIO HEAD WO CONTRAST  Result Date: 04/12/2022 CLINICAL DATA:  Acute neurologic deficits EXAM: MRI HEAD WITHOUT CONTRAST MRA HEAD WITHOUT CONTRAST TECHNIQUE: Multiplanar, multi-echo pulse sequences of the brain and surrounding structures were acquired without intravenous contrast. Angiographic images of the Circle of Willis were acquired using MRA technique without intravenous contrast. COMPARISON:  None Available. FINDINGS: MRI HEAD FINDINGS Brain: No acute infarct, mass effect or extra-axial collection. Bifrontal encephalomalacia with chronic mild siderosis. There is multifocal hyperintense T2-weighted signal within the white matter. Generalized volume loss. The midline structures are normal. Vascular: Major flow voids are preserved. Skull and upper cervical spine: Normal calvarium and skull base. Visualized upper cervical spine and soft tissues are normal. Sinuses/Orbits:No paranasal sinus fluid levels or advanced mucosal thickening. No mastoid or middle ear effusion. Normal orbits. MRA HEAD FINDINGS POSTERIOR CIRCULATION: --Vertebral  arteries: Normal --Inferior cerebellar arteries: Normal. --Basilar artery: Normal. --Superior cerebellar arteries: Normal. --Posterior cerebral arteries: Normal. ANTERIOR CIRCULATION: --Intracranial internal carotid arteries: Normal. --Anterior cerebral arteries (ACA): Normal. --Middle cerebral arteries (MCA): Normal. ANATOMIC VARIANTS: Both posterior communicating arteries are patent. IMPRESSION: 1. No acute intracranial abnormality. 2. Bifrontal encephalomalacia with chronic mild siderosis, consistent with remote trauma. 3. Normal intracranial MRA. Electronically Signed   By: Deatra RobinsonKevin  Herman M.D.   On: 04/12/2022 20:49   DG Abd 1 View  Result Date: 04/10/2022 CLINICAL DATA:  Small bowel obstruction. EXAM: ABDOMEN - 1 VIEW COMPARISON:  04/09/2022 and prior radiographs FINDINGS: An NG tube is noted with tip kinked in the mid to distal stomach. Contrast throughout the colon is now identified. No definite dilated gas-filled small bowel loops are identified on this study. Contrast in the bladder is again noted. IMPRESSION: 1. NG tube with tip kinked in the mid to distal stomach. 2. Contrast throughout the colon. No definite dilated gas-filled small bowel loops identified on this study. Electronically Signed   By: Harmon PierJeffrey  Hu M.D.   On: 04/10/2022 10:02   DG Abd Portable 1V-Small Bowel Obstruction Protocol-initial, 8 hr delay  Result Date: 04/09/2022 CLINICAL DATA:  Small-bowel obstruction. EXAM: PORTABLE ABDOMEN - 1 VIEW COMPARISON:  Abdomen film yesterday at 2:24 p.m., abdominopelvic CT yesterday at 3:48 a.m. FINDINGS: NGT folds over on itself in the gastric antrum and is kinked at the side hole, therefore may not be properly functioning. Contrast is now seen in the ascending colon moderate stool seen in the more distal colon. There is contrast opacifying the bladder as well. Dilated small bowel is no longer seen in the lower abdomen and pelvis but is still seen in the mid abdomen with  segments up to 3.8 cm  caliber. Motor severe levorotary lumbar scoliosis is redemonstrated. No supine evidence of free air. IMPRESSION: 1. Some improvement. Small bowel dilatation no longer seen in the lower abdomen and pelvis but still seen in the mid abdomen up to 3.8 cm. 2. Contrast now seen in the ascending colon but not seen in the more distal colon. 3. NGT folded back on itself at the gastric antrum and is kinked at the side hole. Clinical correlation. Electronically Signed   By: Almira Bar M.D.   On: 04/09/2022 07:43   DG Abd 1 View  Result Date: 04/08/2022 CLINICAL DATA:  NG tube placement EXAM: ABDOMEN - 1 VIEW COMPARISON:  Same-day radiograph at 0938 hours FINDINGS: A single spot oblique film of the abdomen demonstrates a nasogastric tube tip and side port projecting over the expected location of the stomach. 0.2 minutes of fluoroscopic time was utilized with a dose of 1.5 mGy. IMPRESSION: Nasogastric tube with tip and side port projecting over the expected location of the stomach. Electronically Signed   By: Maudry Mayhew M.D.   On: 04/08/2022 15:06   DG Abdomen 1 View  Result Date: 04/08/2022 CLINICAL DATA:  NG tube adjustment EXAM: ABDOMEN - 1 VIEW COMPARISON:  04/08/2022 FINDINGS: A spot film and fluoroscopic screen save obtained during nasogastric tube adjustment were submitted. Both images demonstrate NG tube distal tip and side port projecting below the diaphragms within the expected location of the gastric body. 1 minute 30 seconds of fluoroscopy time was utilized. IMPRESSION: NG tube positioning as described. Electronically Signed   By: Duanne Guess D.O.   On: 04/08/2022 10:03   DG Abd Portable 1V-Small Bowel Protocol-Position Verification  Addendum Date: 04/08/2022   ADDENDUM REPORT: 04/08/2022 08:31 ADDENDUM: These results were called by telephone at the time of interpretation on 04/08/2022 at 8:30 am to provider Dr. Ophelia Charter, who verbally acknowledged these results. Electronically Signed   By: Donzetta Kohut M.D.   On: 04/08/2022 08:31   Result Date: 04/08/2022 CLINICAL DATA:  Nasogastric tube post repositioning. EXAM: PORTABLE ABDOMEN - 1 VIEW COMPARISON:  Comparison made with imaging from April 08, 2022. FINDINGS: EKG leads project over the chest and abdomen. Gastric tube remains coiled likely in proximal stomach extending into the esophagus, tip directed towards the esophagus. Side port in similar position likely above the GE junction with tip above the side port in the distal esophagus. Visualized lung bases are clear. Bowel gas pattern still with gas-filled distended loops of bowel in the lower abdomen distended loops of small bowel better assessed on previous imaging studies. Urinary bladder is partially filled with excreted contrast media. IMPRESSION: 1. Gastric tube extending into the stomach with redundant loop, then ascending into the esophagus. Side port not changed with respect position and tip directed into the mid esophagus as on the previous radiograph, tip off the field of view. 2. Persistent small bowel distension in the lower abdomen grossly similar to previous imaging. Electronically Signed: By: Donzetta Kohut M.D. On: 04/08/2022 08:06   DG Abdomen 1 View  Result Date: 04/08/2022 CLINICAL DATA:  NG tube placement. EXAM: ABDOMEN - 1 VIEW COMPARISON:  Earlier same day. FINDINGS: NG tube is again noted to be folded back on itself in the region of the diaphragmatic hiatus. Patient has a hiatal hernia on CT scan from earlier today and probably the tube is folded on itself in the hiatal hernia before traversing cranially back up the esophagus. The tip has not been  included on the film but is probably in the upper thoracic esophagus. IMPRESSION: NG tube remains folded back on itself, likely secondary to the hiatal hernia. Tube extends cranially up the esophagus, with the tip not included on the film but probably in the upper thoracic esophagus. Electronically Signed   By: Kennith Center M.D.   On:  04/08/2022 08:10   DG Abdomen 1 View  Result Date: 04/08/2022 CLINICAL DATA:  NG tube placement. EXAM: ABDOMEN - 1 VIEW COMPARISON:  02/01/2021 FINDINGS: NG tube passes to the level of the distal esophagus where it folds back on itself and courses back up the esophagus towards the mouth. Proximal side port of the tube is just below the level of the carina suggesting that the tip is positioned in the upper thoracic esophagus. Gas distended small bowel loops in the abdomen measure up to 4 cm diameter. IMPRESSION: NG tube passes to the distal esophagus where it folds back on itself and then extends up the esophagus towards the mouth. NG tube should be repositioned. These results will be called to the ordering clinician or representative by the Radiologist Assistant, and communication documented in the PACS or [employer]. Electronically Signed   By: Kennith Center M.D.   On: 04/08/2022 06:52   CT ABDOMEN PELVIS W CONTRAST  Result Date: 04/08/2022 CLINICAL DATA:  Bowel obstruction suspected EXAM: CT ABDOMEN AND PELVIS WITH CONTRAST TECHNIQUE: Multidetector CT imaging of the abdomen and pelvis was performed using the standard protocol following bolus administration of intravenous contrast. RADIATION DOSE REDUCTION: This exam was performed according to the departmental dose-optimization program which includes automated exposure control, adjustment of the mA and/or kV according to patient size and/or use of iterative reconstruction technique. CONTRAST:  OMNIPAQUE IOHEXOL 300 MG/ML  SOLN COMPARISON:  01/31/2021 FINDINGS: Lower chest:  Moderate sliding hiatal hernia Hepatobiliary: No focal liver abnormality.Mild biliary dilatation after cholecystectomy. Pancreas: Distorted pancreas from the degree of scoliosis. No focal finding or inflammatory changes Spleen: Unremarkable. Adrenals/Urinary Tract: Negative adrenals. No hydronephrosis or stone. High-density at the right renal collecting system is likely early  contrast excretion based on 2020 comparison. Left renal cortical scarring to a moderate degree. Negative bladder. Stomach/Bowel: Dilated small bowel containing fluid levels leading to a transition point where there is distorted and twisted bowel loops/mesentery in the lower midline abdomen, just above an entero-enteric anastomosis. Distal loops are decompressed. Proximal small bowel loops are relatively decompressed but no proximal transition point or association with the twisted segment to imply closed loop obstruction. Especially based on reformats, some of the bowel at the level of twist and anastomosis are edematous. No perforation. Vascular/Lymphatic: No acute vascular abnormality. Mild atheromatous calcification. No mass or adenopathy. Reproductive:No acute finding Other: Small volume ascites especially around the liver, also seen on prior. Musculoskeletal: Pronounced levoscoliosis with degeneration and L4-5 anterolisthesis. No acute abnormalities. IMPRESSION: High-grade small bowel obstruction due to twisted small bowel in the low abdomen, just before an enteroenteric anastomosis. The twisted segment and anastomosis are edematous. Moderate hiatal hernia. Electronically Signed   By: Tiburcio Pea M.D.   On: 04/08/2022 04:27     Subjective: No issues/overnight   Discharge Exam: Vitals:   04/13/22 0615 04/13/22 0739  BP: 110/77 (!) 124/93  Pulse: 75 66  Resp: 17 16  Temp: 98 F (36.7 C) 97.9 F (36.6 C)  SpO2: 95% 94%   Vitals:   04/12/22 1618 04/12/22 2048 04/13/22 0615 04/13/22 0739  BP: (!) 89/60 117/70 110/77 (!) 124/93  Pulse: 79 77 75 66  Resp: 17 18 17 16   Temp: 97.6 F (36.4 C)  98 F (36.7 C) 97.9 F (36.6 C)  TempSrc: Oral  Oral Oral  SpO2: 95% 100% 95% 94%  Weight:      Height:        General: Pt is alert, awake, not in acute distress Cardiovascular: RRR, S1/S2 +, no rubs, no gallops Respiratory: CTA bilaterally, no wheezing, no rhonchi Abdominal: Soft, NT, ND,  bowel sounds + Extremities: no edema, no cyanosis    The results of significant diagnostics from this hospitalization (including imaging, microbiology, ancillary and laboratory) are listed below for reference.     Microbiology: Recent Results (from the past 240 hour(s))  Urine Culture     Status: Abnormal (Preliminary result)   Collection Time: 04/11/22  8:27 AM   Specimen: In/Out Cath Urine  Result Value Ref Range Status   Specimen Description IN/OUT CATH URINE  Final   Special Requests   Final    NONE Performed at [facility], [address], [place], [place]    Culture 60,000 COLONIES/mL GRAM NEGATIVE RODS (A)  Final   Report Status PENDING  Incomplete     Labs: BNP (last 3 results) No results for input(s): "BNP" in the last 8760 hours. Basic Metabolic Panel: Recent Labs  Lab 04/08/22 0235 04/10/22 0829 04/11/22 0045 04/12/22 0041  NA 138 138 138 139  K 4.5 3.4* 3.1* 4.4  CL 100 101 103 106  CO2 26 29 27 24   GLUCOSE 120* 95 94 86  BUN 17 19 17 14   CREATININE 0.74 0.70 0.67 0.75  CALCIUM 9.7 8.3* 8.0* 8.4*  MG  --   --  1.9 2.0  PHOS  --   --  2.3* 2.2*   Liver Function Tests: Recent Labs  Lab 04/08/22 0235 04/10/22 0829 04/11/22 0045 04/12/22 0041  AST 32 16 16 18   ALT 18 14 13 14   ALKPHOS 58 38 44 49  BILITOT 0.7 0.4 0.3 0.4  PROT 7.0 4.8* 5.0* 5.2*  ALBUMIN 4.4 3.0* 3.0* 3.1*   Recent Labs  Lab 04/08/22 0235  LIPASE 26   No results for input(s): "AMMONIA" in the last 168 hours. CBC: Recent Labs  Lab 04/08/22 0235 04/10/22 0829 04/11/22 0045 04/12/22 0041  WBC 13.2* 5.8 6.1 7.3  NEUTROABS  --  3.6 4.0 5.0  HGB 12.5 8.1* 8.7* 9.0*  HCT 41.0 25.6* 28.9* 30.2*  MCV 73.9* 74.9* 75.9* 76.5*  PLT 342 220 221 220   Cardiac Enzymes: No results for input(s): "CKTOTAL", "CKMB", "CKMBINDEX", "TROPONINI" in the last 168 hours. BNP: Invalid input(s): "POCBNP" CBG: No results for input(s): "GLUCAP" in the last 168  hours. D-Dimer No results for input(s): "DDIMER" in the last 72 hours. Hgb A1c No results for input(s): "HGBA1C" in the last 72 hours. Lipid Profile No results for input(s): "CHOL", "HDL", "LDLCALC", "TRIG", "CHOLHDL", "LDLDIRECT" in the last 72 hours. Thyroid function studies No results for input(s): "TSH", "T4TOTAL", "T3FREE", "THYROIDAB" in the last 72 hours.  Invalid input(s): "FREET3" Anemia work up Recent Labs    04/12/22 0041  VITAMINB12 900  FOLATE 33.9  FERRITIN 7*  TIBC 379  IRON 17*   Urinalysis    Component Value Date/Time   COLORURINE AMBER (A) 04/11/2022 0534   APPEARANCEUR CLOUDY (A) 04/11/2022 0534   LABSPEC 1.029 04/11/2022 0534   PHURINE 8.0 04/11/2022 0534   GLUCOSEU NEGATIVE 04/11/2022 0534   HGBUR NEGATIVE 04/11/2022  0534   HGBUR negative 04/28/2009 1323   BILIRUBINUR NEGATIVE 04/11/2022 0534   KETONESUR 20 (A) 04/11/2022 0534   PROTEINUR 30 (A) 04/11/2022 0534   UROBILINOGEN 0.2 04/28/2009 1323   NITRITE POSITIVE (A) 04/11/2022 0534   LEUKOCYTESUR TRACE (A) 04/11/2022 0534   Sepsis Labs Recent Labs  Lab 04/08/22 0235 04/10/22 0829 04/11/22 0045 04/12/22 0041  WBC 13.2* 5.8 6.1 7.3   Microbiology Recent Results (from the past 240 hour(s))  Urine Culture     Status: Abnormal (Preliminary result)   Collection Time: 04/11/22  8:27 AM   Specimen: In/Out Cath Urine  Result Value Ref Range Status   Specimen Description IN/OUT CATH URINE  Final   Special Requests   Final    NONE Performed at [facility], [address], [place], [place] [address]    Culture 60,000 COLONIES/mL Romie Minus NEGATIVE RODS (A)  Final   Report Status PENDING  Incomplete     Time coordinating discharge: Over 30 minutes  SIGNED:   Azucena Fallen, DO [facility] 04/13/2022, 3:54 PM Pager   If 7PM-7AM, please contact night-coverage [email or web address]

## 2022-04-13 NOTE — Plan of Care (Signed)
Pt understanding of DC info

## 2022-04-14 ENCOUNTER — Encounter (HOSPITAL_BASED_OUTPATIENT_CLINIC_OR_DEPARTMENT_OTHER): Payer: Self-pay | Admitting: Physical Therapy

## 2022-04-15 LAB — URINE CULTURE

## 2022-04-18 ENCOUNTER — Ambulatory Visit (INDEPENDENT_AMBULATORY_CARE_PROVIDER_SITE_OTHER): Payer: Medicare Other

## 2022-04-18 ENCOUNTER — Encounter: Payer: Self-pay | Admitting: Orthopedic Surgery

## 2022-04-18 ENCOUNTER — Ambulatory Visit (INDEPENDENT_AMBULATORY_CARE_PROVIDER_SITE_OTHER): Payer: Medicare Other | Admitting: Orthopedic Surgery

## 2022-04-18 DIAGNOSIS — M79671 Pain in right foot: Secondary | ICD-10-CM

## 2022-04-18 DIAGNOSIS — M79605 Pain in left leg: Secondary | ICD-10-CM
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
## 2022-04-19 ENCOUNTER — Ambulatory Visit (HOSPITAL_BASED_OUTPATIENT_CLINIC_OR_DEPARTMENT_OTHER): Payer: Medicare Other | Admitting: Physical Therapy

## 2022-04-21 ENCOUNTER — Encounter (HOSPITAL_BASED_OUTPATIENT_CLINIC_OR_DEPARTMENT_OTHER): Payer: Self-pay | Admitting: Physical Therapy

## 2022-04-26 ENCOUNTER — Encounter (HOSPITAL_BASED_OUTPATIENT_CLINIC_OR_DEPARTMENT_OTHER): Payer: Self-pay | Admitting: Physical Therapy

## 2022-04-26 ENCOUNTER — Other Ambulatory Visit (HOSPITAL_BASED_OUTPATIENT_CLINIC_OR_DEPARTMENT_OTHER): Payer: Self-pay | Admitting: Nurse Practitioner

## 2022-04-26 DIAGNOSIS — K219 Gastro-esophageal reflux disease without esophagitis: Secondary | ICD-10-CM

## 2022-04-28 ENCOUNTER — Telehealth: Payer: Self-pay | Admitting: Orthopedic Surgery

## 2022-04-28 NOTE — Telephone Encounter (Signed)
Pt called and states she had to cancel her flight due to her surgery and she is needing a few things from [place] to be able to get refunded for the flight she was unable to get on. PT will be emailing me the e-mail from the airport that explains what all they need from the doctor.   Cb [phone]

## 2022-04-28 NOTE — Telephone Encounter (Signed)
Noted  

## 2022-04-29 ENCOUNTER — Ambulatory Visit (HOSPITAL_BASED_OUTPATIENT_CLINIC_OR_DEPARTMENT_OTHER): Payer: Medicare Other | Admitting: Physical Therapy

## 2022-04-29 DIAGNOSIS — R2689 Other abnormalities of gait and mobility: Secondary | ICD-10-CM | POA: Insufficient documentation

## 2022-04-29 DIAGNOSIS — R293 Abnormal posture: Secondary | ICD-10-CM | POA: Diagnosis present

## 2022-04-29 DIAGNOSIS — M5459 Other low back pain: Secondary | ICD-10-CM

## 2022-04-29 DIAGNOSIS — M546 Pain in thoracic spine: Secondary | ICD-10-CM | POA: Insufficient documentation
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
## 2022-05-02 NOTE — Telephone Encounter (Signed)
Printed copy of last office visit note and discharge summary from most recent hospitalization and will notify pt that this is available for pick up.

## 2022-05-03 ENCOUNTER — Ambulatory Visit (HOSPITAL_BASED_OUTPATIENT_CLINIC_OR_DEPARTMENT_OTHER): Payer: Medicare Other | Admitting: Physical Therapy

## 2022-05-04 ENCOUNTER — Encounter: Payer: Self-pay | Admitting: Family

## 2022-05-04 ENCOUNTER — Ambulatory Visit (INDEPENDENT_AMBULATORY_CARE_PROVIDER_SITE_OTHER): Payer: Medicare Other | Admitting: Family

## 2022-05-04 DIAGNOSIS — M2021 Hallux rigidus, right foot: Secondary | ICD-10-CM
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
## 2022-05-04 NOTE — Telephone Encounter (Signed)
Pt picked this up in the office today.

## 2022-05-05 ENCOUNTER — Ambulatory Visit (HOSPITAL_BASED_OUTPATIENT_CLINIC_OR_DEPARTMENT_OTHER): Payer: Medicare Other | Attending: Physician Assistant | Admitting: Physical Therapy

## 2022-05-05 ENCOUNTER — Encounter (HOSPITAL_BASED_OUTPATIENT_CLINIC_OR_DEPARTMENT_OTHER): Payer: Self-pay | Admitting: Physical Therapy

## 2022-05-05 DIAGNOSIS — M546 Pain in thoracic spine: Secondary | ICD-10-CM | POA: Diagnosis present

## 2022-05-05 DIAGNOSIS — R293 Abnormal posture: Secondary | ICD-10-CM | POA: Diagnosis present

## 2022-05-05 DIAGNOSIS — G8929 Other chronic pain: Secondary | ICD-10-CM | POA: Diagnosis present

## 2022-05-05 DIAGNOSIS — M545 Low back pain, unspecified: Secondary | ICD-10-CM | POA: Insufficient documentation

## 2022-05-05 DIAGNOSIS — M5459 Other low back pain: Secondary | ICD-10-CM | POA: Diagnosis present

## 2022-05-05 DIAGNOSIS — R2689 Other abnormalities of gait and mobility: Secondary | ICD-10-CM | POA: Insufficient documentation

## 2022-05-06 ENCOUNTER — Encounter (HOSPITAL_BASED_OUTPATIENT_CLINIC_OR_DEPARTMENT_OTHER): Payer: Self-pay | Admitting: Physical Therapy
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
## 2022-05-10 ENCOUNTER — Encounter (HOSPITAL_BASED_OUTPATIENT_CLINIC_OR_DEPARTMENT_OTHER): Payer: Self-pay | Admitting: Physical Therapy

## 2022-05-10 ENCOUNTER — Ambulatory Visit (HOSPITAL_BASED_OUTPATIENT_CLINIC_OR_DEPARTMENT_OTHER): Payer: Medicare Other | Admitting: Physical Therapy

## 2022-05-10 DIAGNOSIS — M5459 Other low back pain: Secondary | ICD-10-CM

## 2022-05-10 DIAGNOSIS — R293 Abnormal posture: Secondary | ICD-10-CM

## 2022-05-10 DIAGNOSIS — R2689 Other abnormalities of gait and mobility: Secondary | ICD-10-CM

## 2022-05-10 DIAGNOSIS — M546 Pain in thoracic spine: Secondary | ICD-10-CM
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
## 2022-05-12 ENCOUNTER — Encounter (HOSPITAL_BASED_OUTPATIENT_CLINIC_OR_DEPARTMENT_OTHER): Payer: Self-pay | Admitting: Physical Therapy

## 2022-05-12 ENCOUNTER — Ambulatory Visit (HOSPITAL_BASED_OUTPATIENT_CLINIC_OR_DEPARTMENT_OTHER): Payer: Medicare Other | Admitting: Physical Therapy

## 2022-05-12 DIAGNOSIS — R293 Abnormal posture: Secondary | ICD-10-CM

## 2022-05-12 DIAGNOSIS — M545 Low back pain, unspecified: Secondary | ICD-10-CM

## 2022-05-12 DIAGNOSIS — M5459 Other low back pain: Secondary | ICD-10-CM

## 2022-05-12 DIAGNOSIS — M546 Pain in thoracic spine: Secondary | ICD-10-CM

## 2022-05-12 DIAGNOSIS — R2689 Other abnormalities of gait and mobility: Secondary | ICD-10-CM
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
## 2022-05-13 ENCOUNTER — Encounter (HOSPITAL_BASED_OUTPATIENT_CLINIC_OR_DEPARTMENT_OTHER): Payer: Self-pay | Admitting: Physical Therapy

## 2022-05-17 ENCOUNTER — Ambulatory Visit (HOSPITAL_BASED_OUTPATIENT_CLINIC_OR_DEPARTMENT_OTHER): Payer: Medicare Other | Admitting: Physical Therapy

## 2022-05-19 ENCOUNTER — Ambulatory Visit (HOSPITAL_BASED_OUTPATIENT_CLINIC_OR_DEPARTMENT_OTHER): Payer: Medicare Other | Admitting: Physical Therapy

## 2022-05-19 ENCOUNTER — Telehealth (HOSPITAL_BASED_OUTPATIENT_CLINIC_OR_DEPARTMENT_OTHER): Payer: Self-pay

## 2022-05-19 NOTE — Telephone Encounter (Signed)
Patient called and needs a refill on her adderall sent to [facility]. Please advise she will be out Monday.

## 2022-05-20 NOTE — Telephone Encounter (Signed)
No fill on Adderall.  Please let her know that the database shows she received her last Adderall prescription from the pharmacy on 05/04/2022 with 60 tabs dispensed. Earliest fill would be 06/03/2022.   Review of PDMP shows that there are several discrepancies in the filling of her medication. Based on the review, she should have 108 tabs remaining from prescriptions that have been sent over the last 12 months.  Unfortunately, I will no longer be able to fill adderall for her. OK to send referral to psychiatry for further evaluation and management.   Dispenses and amount from last year for adderall 30mg  are as follows: 05/07/2021 #60 06/08/2021 #60 07/08/2021 #60 08/06/2021 #60 09/04/2021 #60 09/30/2021 #54 10/30/2021 #60 11/26/2021 #60 12/23/2021 #60 01/22/2022 #60 * 02/18/2022 #60 * 02/19/2022 #60 * Jozy Mcphearson fill 03/21/2022 #30 * Guliana Weyandt fill 04/05/2022 #60 * 05/04/2022 #60 *  Additional medications tried during this time period: 12/16/2021 #30  *ER- trying new med 02/21/2022 #30 * Mydasis- trying new med- reportedly picked up, although not covered by insurance per patient  780 + 84 = 864 tabs = 432 days with twice a day dosing of medication given in last 378 days (as of today 05/20/2022)

## 2022-05-23 ENCOUNTER — Ambulatory Visit (HOSPITAL_BASED_OUTPATIENT_CLINIC_OR_DEPARTMENT_OTHER): Payer: Medicare Other | Admitting: Nurse Practitioner

## 2022-05-24 ENCOUNTER — Encounter (HOSPITAL_BASED_OUTPATIENT_CLINIC_OR_DEPARTMENT_OTHER): Payer: Medicare Other | Admitting: Physical Therapy

## 2022-05-25 ENCOUNTER — Ambulatory Visit (HOSPITAL_BASED_OUTPATIENT_CLINIC_OR_DEPARTMENT_OTHER): Payer: Medicare Other | Admitting: Physical Therapy

## 2022-05-25 DIAGNOSIS — M5459 Other low back pain: Secondary | ICD-10-CM | POA: Diagnosis not present

## 2022-05-25 DIAGNOSIS — M546 Pain in thoracic spine: Secondary | ICD-10-CM

## 2022-05-25 DIAGNOSIS — R293 Abnormal posture: Secondary | ICD-10-CM

## 2022-05-25 DIAGNOSIS — R2689 Other abnormalities of gait and mobility: Secondary | ICD-10-CM
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
## 2022-05-26 ENCOUNTER — Ambulatory Visit (HOSPITAL_BASED_OUTPATIENT_CLINIC_OR_DEPARTMENT_OTHER): Payer: Medicare Other | Admitting: Physical Therapy

## 2022-05-26 ENCOUNTER — Ambulatory Visit (HOSPITAL_BASED_OUTPATIENT_CLINIC_OR_DEPARTMENT_OTHER): Payer: Medicare Other | Admitting: Nurse Practitioner

## 2022-05-26 ENCOUNTER — Encounter (HOSPITAL_BASED_OUTPATIENT_CLINIC_OR_DEPARTMENT_OTHER): Payer: Self-pay | Admitting: Physical Therapy

## 2022-05-26 ENCOUNTER — Telehealth (HOSPITAL_BASED_OUTPATIENT_CLINIC_OR_DEPARTMENT_OTHER): Payer: Self-pay

## 2022-05-26 DIAGNOSIS — M546 Pain in thoracic spine: Secondary | ICD-10-CM

## 2022-05-26 DIAGNOSIS — M5459 Other low back pain: Secondary | ICD-10-CM

## 2022-05-26 DIAGNOSIS — M545 Low back pain, unspecified: Secondary | ICD-10-CM

## 2022-05-26 DIAGNOSIS — R293 Abnormal posture: Secondary | ICD-10-CM

## 2022-05-26 DIAGNOSIS — R2689 Other abnormalities of gait and mobility: Secondary | ICD-10-CM

## 2022-05-26 NOTE — Telephone Encounter (Signed)
Patient didn't need an appointment today, She would like a referral to endo and Gastro. She would like refills on Wellburtin and Adderall
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
## 2022-05-30 NOTE — Telephone Encounter (Signed)
I have sent the refills on Wellbutrin. I sent a note last week about the Adderall. It is too Deborah [name] for a refill and PDMP review shows that she has had more refills in the last 12 months than appropriate for the way it is prescribed. Please refer to message sent previously for documented dispensing. Given this discrepancy, we will need to send a referral to psychiatry for further management as I cannot safely continue to prescribe.

## 2022-05-31 ENCOUNTER — Ambulatory Visit (HOSPITAL_BASED_OUTPATIENT_CLINIC_OR_DEPARTMENT_OTHER): Payer: Medicare Other | Admitting: Physical Therapy

## 2022-05-31 DIAGNOSIS — M546 Pain in thoracic spine: Secondary | ICD-10-CM

## 2022-05-31 DIAGNOSIS — M5459 Other low back pain: Secondary | ICD-10-CM | POA: Diagnosis not present

## 2022-05-31 DIAGNOSIS — R293 Abnormal posture: Secondary | ICD-10-CM

## 2022-05-31 DIAGNOSIS — G8929 Other chronic pain: Secondary | ICD-10-CM

## 2022-05-31 DIAGNOSIS — R2689 Other abnormalities of gait and mobility: Secondary | ICD-10-CM
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
## 2022-06-02 ENCOUNTER — Ambulatory Visit (HOSPITAL_BASED_OUTPATIENT_CLINIC_OR_DEPARTMENT_OTHER): Payer: Medicare Other | Admitting: Physical Therapy

## 2022-06-02 ENCOUNTER — Other Ambulatory Visit (HOSPITAL_BASED_OUTPATIENT_CLINIC_OR_DEPARTMENT_OTHER): Payer: Self-pay | Admitting: Nurse Practitioner

## 2022-06-02 NOTE — Telephone Encounter (Signed)
Please call the pt to advise on the Adderall Medication --as pt came by to inquire    The pt is waiting up front for someone to talk to her

## 2022-06-03 ENCOUNTER — Encounter: Payer: Self-pay | Admitting: Nurse Practitioner

## 2022-06-03 NOTE — Telephone Encounter (Signed)
Deborah [name], CMA called the patient this morning to notify her that based on her prescription refill record, she has had 2 Deborah [name] pick-ups of the medication and two instances with trial of other medications that should have left her with a surplus of at least 54 tablets as of today.   Due to Deborah [name] fills and discrepancy in requests for refills, I am unable to refill the prescription at this time. If the patient is unable to be seen by psychiatry, but has an appointment scheduled, I will fill the prescription no earlier than 06/27/2022 which gives her a three day buffer. The patient must establish with psychiatry for continued management.

## 2022-06-03 NOTE — Telephone Encounter (Signed)
Pt stopped by the office yesterday regarding a refill on Adderall. I went and spoke with patient and told her that I would speak with SB as she stated that she took her last adderall on 06/02/2022.  After speaking with SB she checked the PDMP the patient should have 54 pills left, I explained this to the patient in great detail. She cancelled her appointment w/SB 06/09/2022 and stated she would be following SB to her new location.

## 2022-06-07 ENCOUNTER — Ambulatory Visit (HOSPITAL_BASED_OUTPATIENT_CLINIC_OR_DEPARTMENT_OTHER): Payer: Medicare Other | Attending: Physician Assistant | Admitting: Physical Therapy

## 2022-06-07 ENCOUNTER — Encounter (HOSPITAL_BASED_OUTPATIENT_CLINIC_OR_DEPARTMENT_OTHER): Payer: Self-pay | Admitting: Physical Therapy

## 2022-06-07 DIAGNOSIS — M5459 Other low back pain: Secondary | ICD-10-CM | POA: Diagnosis not present

## 2022-06-07 DIAGNOSIS — R2689 Other abnormalities of gait and mobility: Secondary | ICD-10-CM | POA: Insufficient documentation

## 2022-06-07 DIAGNOSIS — M545 Low back pain, unspecified: Secondary | ICD-10-CM | POA: Diagnosis present

## 2022-06-07 DIAGNOSIS — R293 Abnormal posture: Secondary | ICD-10-CM | POA: Diagnosis present

## 2022-06-07 DIAGNOSIS — G8929 Other chronic pain: Secondary | ICD-10-CM | POA: Diagnosis present

## 2022-06-07 DIAGNOSIS — M546 Pain in thoracic spine: Secondary | ICD-10-CM | POA: Diagnosis present
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
## 2022-06-09 ENCOUNTER — Encounter (HOSPITAL_BASED_OUTPATIENT_CLINIC_OR_DEPARTMENT_OTHER): Payer: Self-pay | Admitting: Physical Therapy

## 2022-06-09 ENCOUNTER — Ambulatory Visit (HOSPITAL_BASED_OUTPATIENT_CLINIC_OR_DEPARTMENT_OTHER): Payer: Medicare Other | Admitting: Physical Therapy

## 2022-06-09 ENCOUNTER — Ambulatory Visit (HOSPITAL_BASED_OUTPATIENT_CLINIC_OR_DEPARTMENT_OTHER): Payer: Medicare Other | Admitting: Nurse Practitioner

## 2022-06-09 DIAGNOSIS — M5459 Other low back pain: Secondary | ICD-10-CM | POA: Diagnosis not present

## 2022-06-09 DIAGNOSIS — G8929 Other chronic pain: Secondary | ICD-10-CM

## 2022-06-09 DIAGNOSIS — R2689 Other abnormalities of gait and mobility: Secondary | ICD-10-CM

## 2022-06-09 DIAGNOSIS — M546 Pain in thoracic spine: Secondary | ICD-10-CM

## 2022-06-09 DIAGNOSIS — R293 Abnormal posture: Secondary | ICD-10-CM
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
## 2022-06-10 ENCOUNTER — Encounter: Payer: Self-pay | Admitting: Family

## 2022-06-14 ENCOUNTER — Ambulatory Visit (HOSPITAL_BASED_OUTPATIENT_CLINIC_OR_DEPARTMENT_OTHER): Payer: Medicare Other | Admitting: Physical Therapy

## 2022-06-15 ENCOUNTER — Ambulatory Visit (HOSPITAL_BASED_OUTPATIENT_CLINIC_OR_DEPARTMENT_OTHER): Payer: Medicare Other | Admitting: Family Medicine

## 2022-06-16 ENCOUNTER — Ambulatory Visit (INDEPENDENT_AMBULATORY_CARE_PROVIDER_SITE_OTHER): Payer: Medicare Other | Admitting: Nurse Practitioner

## 2022-06-16 ENCOUNTER — Encounter (HOSPITAL_BASED_OUTPATIENT_CLINIC_OR_DEPARTMENT_OTHER): Payer: Self-pay | Admitting: Nurse Practitioner

## 2022-06-16 ENCOUNTER — Ambulatory Visit (HOSPITAL_BASED_OUTPATIENT_CLINIC_OR_DEPARTMENT_OTHER): Payer: Medicare Other | Admitting: Physical Therapy

## 2022-06-16 VITALS — BP 134/95 | HR 92 | Temp 98.1°F

## 2022-06-16 DIAGNOSIS — M5459 Other low back pain: Secondary | ICD-10-CM | POA: Diagnosis not present

## 2022-06-16 DIAGNOSIS — R051 Acute cough: Secondary | ICD-10-CM

## 2022-06-16 DIAGNOSIS — R293 Abnormal posture: Secondary | ICD-10-CM

## 2022-06-16 DIAGNOSIS — K219 Gastro-esophageal reflux disease without esophagitis: Secondary | ICD-10-CM | POA: Diagnosis not present

## 2022-06-16 DIAGNOSIS — G8929 Other chronic pain: Secondary | ICD-10-CM

## 2022-06-16 DIAGNOSIS — F332 Major depressive disorder, recurrent severe without psychotic features: Secondary | ICD-10-CM | POA: Diagnosis not present

## 2022-06-16 DIAGNOSIS — F900 Attention-deficit hyperactivity disorder, predominantly inattentive type: Secondary | ICD-10-CM

## 2022-06-16 DIAGNOSIS — F5104 Psychophysiologic insomnia: Secondary | ICD-10-CM

## 2022-06-16 DIAGNOSIS — R2689 Other abnormalities of gait and mobility: Secondary | ICD-10-CM

## 2022-06-16 DIAGNOSIS — M546 Pain in thoracic spine: Secondary | ICD-10-CM

## 2022-06-16 DIAGNOSIS — H60321 Hemorrhagic otitis externa, right ear: Secondary | ICD-10-CM

## 2022-06-16 MED ORDER — OMEPRAZOLE 40 MG PO CPDR: 40.0000 mg | DELAYED_RELEASE_CAPSULE | Freq: Two times a day (BID) | ORAL | 3 refills | Status: DC

## 2022-06-16 MED ORDER — CIPROFLOXACIN-DEXAMETHASONE 0.3-0.1 % OT SUSP: 4.0000 [drp] | Freq: Two times a day (BID) | OTIC | 0 refills | Status: DC

## 2022-06-16 MED ORDER — AMPHETAMINE-DEXTROAMPHETAMINE 30 MG PO TABS: 1.0000 | ORAL_TABLET | Freq: Two times a day (BID) | ORAL | 0 refills | Status: DC

## 2022-06-16 MED ORDER — QUETIAPINE FUMARATE 25 MG PO TABS: 25.0000 mg | ORAL_TABLET | Freq: Every day | ORAL | 3 refills | Status: DC

## 2022-06-16 MED ORDER — BENZONATATE 200 MG PO CAPS: 200.0000 mg | ORAL_CAPSULE | Freq: Three times a day (TID) | ORAL | 0 refills | Status: DC | PRN

## 2022-06-16 NOTE — Patient Instructions (Addendum)
I have sent in the refills for your Adderall starting on Monday.   I have sent in drops for your ear infection. Use this twice a day for 7 days. PUt the drops in your ear and then cover the ear with a cotton ball. Dont use q-tips until you finish the antibiotics.   I sent in cough pearls for the cough.   I sent in refills of your sleep medication and changed it to 1 -2 tabs a day and I sent in the omeprazole for twice a day doses.
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
## 2022-06-17 ENCOUNTER — Encounter (HOSPITAL_BASED_OUTPATIENT_CLINIC_OR_DEPARTMENT_OTHER): Payer: Self-pay | Admitting: Physical Therapy
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
## 2022-06-20 ENCOUNTER — Encounter (HOSPITAL_BASED_OUTPATIENT_CLINIC_OR_DEPARTMENT_OTHER): Payer: Medicare Other | Admitting: Physical Therapy

## 2022-06-20 ENCOUNTER — Telehealth: Payer: Self-pay | Admitting: Nurse Practitioner

## 2022-06-20 DIAGNOSIS — F900 Attention-deficit hyperactivity disorder, predominantly inattentive type: Secondary | ICD-10-CM

## 2022-06-20 NOTE — Telephone Encounter (Signed)
Pt called & states [facility] is out of her Adderall & she called [facility] they have it in stock, please sent to [facility] on [address] t#[phone]

## 2022-06-21 ENCOUNTER — Ambulatory Visit (INDEPENDENT_AMBULATORY_CARE_PROVIDER_SITE_OTHER): Payer: Medicare Other | Admitting: Nurse Practitioner

## 2022-06-21 VITALS — BP 110/70 | HR 110 | Wt 111.4 lb

## 2022-06-21 DIAGNOSIS — R2242 Localized swelling, mass and lump, left lower limb: Secondary | ICD-10-CM

## 2022-06-21 MED ORDER — AMPHETAMINE-DEXTROAMPHETAMINE 30 MG PO TABS: 1.0000 | ORAL_TABLET | Freq: Two times a day (BID) | ORAL | 0 refills | Status: DC

## 2022-06-21 NOTE — Telephone Encounter (Signed)
Thank you. Medication has been sent to requested pharmacy.

## 2022-06-21 NOTE — Progress Notes (Signed)
Tollie Eth, DNP, AGNP-c Primary Care & Sports Medicine [address]  [address] [place], [place] [address] [phone]  Subjective:   Deborah [name] is a 66 y.o. female presents to day for evaluation of: knot on knee (Knot on knee. Hasn't taken shower in 4-5 days and when she went to go get in the shower Sunday and noticed knot. No pain, feels like a spongey feeling)  Knee Mass Deborah [name] reports while getting in the shower over the weekend she noted a small mass over the kneecap of her left knee.  She reports that this has not been there previously however she is not sure exactly when it came up.  She denies any known injury or limitations in range of motion.  The area is not painful to palpation and there are no paresthesias.  She has not had anything like this happen in the past.  PMH, Medications, and Allergies reviewed and updated in chart as appropriate.   ROS negative except for what is listed in HPI. Objective:  BP 110/70   Pulse (!) 110   Wt 111 lb 6.4 oz (50.5 kg)   BMI 19.12 kg/m  Physical Exam Vitals and nursing note reviewed.  Constitutional:      Appearance: Normal appearance.  HENT:     Head: Normocephalic.  Eyes:     Pupils: Pupils are equal, round, and reactive to light.  Cardiovascular:     Rate and Rhythm: Normal rate and regular rhythm.     Pulses: Normal pulses.     Heart sounds: Normal heart sounds.  Pulmonary:     Effort: Pulmonary effort is normal.     Breath sounds: Normal breath sounds.  Musculoskeletal:        General: No tenderness or signs of injury. Normal range of motion.     Right knee: Normal.     Left knee: Swelling present. No erythema, ecchymosis or crepitus. Normal range of motion. No tenderness. Normal alignment. Normal pulse.     Right lower leg: No edema.     Left lower leg: No edema.       Legs:  Neurological:     Mental Status: She is alert.           Assessment & Plan:   Problem List  Items Addressed This Visit     Mass of left knee - Primary    Round, mobile, soft approximate 3 cm mass noted to the anterior surface of the left knee.  At this time etiology is unclear.  She is not experiencing any pain, weakness, or signs of infection.  Given the sudden onset and lack of symptoms I feel that a watchful waiting approach is appropriate to determine if this is something that we will resolve spontaneously. 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 I do feel that surgical removal would increase her risk of infection and given her current level of health and overall concerns, I do not feel this is a priority at this time.  We will plan to continue to monitor the area and if any changes are noted we can consider surgical referral for removal.  Encouraged the patient to utilize an Ace type wrap to see if compression is helpful.  If this is still bothering her we can always consider imaging for further evaluation.         Tollie Eth, DNP, AGNP-c 07/06/2022  4:18 PM    History, Medications, Surgery, SDOH, and  Family History reviewed and updated as appropriate.

## 2022-06-21 NOTE — Telephone Encounter (Signed)
Pt called in again, she wants to pick up her adderral before her appointment today with you at 4pm but she needs pharmacies changed to [facility] on [address] due to [employer] [facility] being out of stock

## 2022-06-22 ENCOUNTER — Ambulatory Visit (HOSPITAL_BASED_OUTPATIENT_CLINIC_OR_DEPARTMENT_OTHER): Payer: Medicare Other | Admitting: Physical Therapy

## 2022-06-27 ENCOUNTER — Ambulatory Visit: Payer: Medicare Other | Admitting: Nurse Practitioner

## 2022-06-28 ENCOUNTER — Encounter (HOSPITAL_BASED_OUTPATIENT_CLINIC_OR_DEPARTMENT_OTHER): Payer: Self-pay | Admitting: Physical Therapy

## 2022-06-28 ENCOUNTER — Ambulatory Visit (HOSPITAL_BASED_OUTPATIENT_CLINIC_OR_DEPARTMENT_OTHER): Payer: Medicare Other | Admitting: Physical Therapy

## 2022-06-28 DIAGNOSIS — R293 Abnormal posture: Secondary | ICD-10-CM

## 2022-06-28 DIAGNOSIS — M546 Pain in thoracic spine: Secondary | ICD-10-CM

## 2022-06-28 DIAGNOSIS — R2689 Other abnormalities of gait and mobility: Secondary | ICD-10-CM

## 2022-06-28 DIAGNOSIS — M5459 Other low back pain: Secondary | ICD-10-CM | POA: Diagnosis not present
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
## 2022-06-29 ENCOUNTER — Ambulatory Visit: Payer: Medicare Other | Admitting: Nurse Practitioner

## 2022-06-29 ENCOUNTER — Telehealth: Payer: Self-pay | Admitting: Nurse Practitioner

## 2022-06-29 NOTE — Telephone Encounter (Signed)
Good Morning Colleen,   Patient stated that she had wrote down her appointment for today at 11:30 for tomorrow on her calendar and would not be able to come in tomorrow.   Patient was rescheduled for 1/5 at 2:30 with Hyacinth Meeker.

## 2022-06-30 ENCOUNTER — Ambulatory Visit (HOSPITAL_BASED_OUTPATIENT_CLINIC_OR_DEPARTMENT_OTHER): Payer: Medicare Other | Admitting: Physical Therapy

## 2022-06-30 DIAGNOSIS — M5459 Other low back pain: Secondary | ICD-10-CM | POA: Diagnosis not present

## 2022-06-30 DIAGNOSIS — G8929 Other chronic pain: Secondary | ICD-10-CM

## 2022-06-30 DIAGNOSIS — R293 Abnormal posture: Secondary | ICD-10-CM

## 2022-06-30 DIAGNOSIS — M546 Pain in thoracic spine: Secondary | ICD-10-CM

## 2022-06-30 DIAGNOSIS — R2689 Other abnormalities of gait and mobility: Secondary | ICD-10-CM
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
## 2022-07-05 ENCOUNTER — Ambulatory Visit (HOSPITAL_BASED_OUTPATIENT_CLINIC_OR_DEPARTMENT_OTHER): Payer: Medicare Other | Admitting: Physical Therapy

## 2022-07-06 ENCOUNTER — Encounter: Payer: Self-pay | Admitting: Nurse Practitioner

## 2022-07-06 DIAGNOSIS — R2242 Localized swelling, mass and lump, left lower limb: Secondary | ICD-10-CM | POA: Insufficient documentation

## 2022-07-06 NOTE — Assessment & Plan Note (Signed)
Round, mobile, soft approximate 3 cm mass noted to the anterior surface of the left knee.  At this time etiology is unclear.  She is not experiencing any pain, weakness, or signs of infection.  Given the sudden onset and lack of symptoms I feel that a watchful waiting approach is appropriate to determine if this is something that we will resolve spontaneously. 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 I do feel that surgical removal would increase her risk of infection and given her current level of health and overall concerns, I do not feel this is a priority at this time.  We will plan to continue to monitor the area and if any changes are noted we can consider surgical referral for removal.  Encouraged the patient to utilize an Ace type wrap to see if compression is helpful.  If this is still bothering her we can always consider imaging for further evaluation.

## 2022-07-06 NOTE — Patient Instructions (Signed)
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 If this begins to bother your or cause pain, please let me know and we can evaluate further, but for now I don't want to put you through anymore than we have to. Please let me know if you notice any changes.

## 2022-07-07 ENCOUNTER — Encounter (HOSPITAL_BASED_OUTPATIENT_CLINIC_OR_DEPARTMENT_OTHER): Payer: Medicare Other | Admitting: Physical Therapy

## 2022-07-12 ENCOUNTER — Encounter (HOSPITAL_BASED_OUTPATIENT_CLINIC_OR_DEPARTMENT_OTHER): Payer: Self-pay | Admitting: Physical Therapy

## 2022-07-12 ENCOUNTER — Ambulatory Visit (INDEPENDENT_AMBULATORY_CARE_PROVIDER_SITE_OTHER): Payer: Medicare Other

## 2022-07-12 ENCOUNTER — Ambulatory Visit (HOSPITAL_BASED_OUTPATIENT_CLINIC_OR_DEPARTMENT_OTHER): Payer: Medicare Other | Attending: Physician Assistant | Admitting: Physical Therapy

## 2022-07-12 ENCOUNTER — Ambulatory Visit (INDEPENDENT_AMBULATORY_CARE_PROVIDER_SITE_OTHER): Payer: Medicare Other | Admitting: Orthopedic Surgery

## 2022-07-12 DIAGNOSIS — M2021 Hallux rigidus, right foot: Secondary | ICD-10-CM

## 2022-07-12 DIAGNOSIS — G8929 Other chronic pain: Secondary | ICD-10-CM | POA: Insufficient documentation

## 2022-07-12 DIAGNOSIS — M5459 Other low back pain: Secondary | ICD-10-CM | POA: Diagnosis present

## 2022-07-12 DIAGNOSIS — R2689 Other abnormalities of gait and mobility: Secondary | ICD-10-CM | POA: Diagnosis present

## 2022-07-12 DIAGNOSIS — M546 Pain in thoracic spine: Secondary | ICD-10-CM | POA: Diagnosis present

## 2022-07-12 DIAGNOSIS — R293 Abnormal posture: Secondary | ICD-10-CM | POA: Insufficient documentation

## 2022-07-12 DIAGNOSIS — M545 Low back pain, unspecified: Secondary | ICD-10-CM | POA: Diagnosis present
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
## 2022-07-13 ENCOUNTER — Encounter: Payer: Self-pay | Admitting: Orthopedic Surgery
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
## 2022-07-14 ENCOUNTER — Encounter (HOSPITAL_BASED_OUTPATIENT_CLINIC_OR_DEPARTMENT_OTHER): Payer: Medicare Other | Admitting: Physical Therapy

## 2022-07-15 ENCOUNTER — Other Ambulatory Visit (HOSPITAL_BASED_OUTPATIENT_CLINIC_OR_DEPARTMENT_OTHER): Payer: Self-pay | Admitting: Nurse Practitioner

## 2022-07-15 ENCOUNTER — Encounter (HOSPITAL_BASED_OUTPATIENT_CLINIC_OR_DEPARTMENT_OTHER): Payer: Self-pay | Admitting: Physical Therapy

## 2022-07-15 ENCOUNTER — Ambulatory Visit (HOSPITAL_BASED_OUTPATIENT_CLINIC_OR_DEPARTMENT_OTHER): Payer: Medicare Other | Admitting: Physical Therapy

## 2022-07-15 DIAGNOSIS — R2689 Other abnormalities of gait and mobility: Secondary | ICD-10-CM

## 2022-07-15 DIAGNOSIS — E559 Vitamin D deficiency, unspecified: Secondary | ICD-10-CM

## 2022-07-15 DIAGNOSIS — M5459 Other low back pain: Secondary | ICD-10-CM

## 2022-07-15 DIAGNOSIS — M546 Pain in thoracic spine: Secondary | ICD-10-CM

## 2022-07-15 DIAGNOSIS — R293 Abnormal posture: Secondary | ICD-10-CM
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
## 2022-07-19 ENCOUNTER — Encounter (HOSPITAL_BASED_OUTPATIENT_CLINIC_OR_DEPARTMENT_OTHER): Payer: Self-pay | Admitting: Physical Therapy

## 2022-07-19 ENCOUNTER — Ambulatory Visit (HOSPITAL_BASED_OUTPATIENT_CLINIC_OR_DEPARTMENT_OTHER): Payer: Medicare Other | Admitting: Physical Therapy

## 2022-07-19 ENCOUNTER — Telehealth: Payer: Self-pay | Admitting: Internal Medicine

## 2022-07-19 DIAGNOSIS — M5459 Other low back pain: Secondary | ICD-10-CM

## 2022-07-19 DIAGNOSIS — R2689 Other abnormalities of gait and mobility: Secondary | ICD-10-CM

## 2022-07-19 DIAGNOSIS — F900 Attention-deficit hyperactivity disorder, predominantly inattentive type: Secondary | ICD-10-CM

## 2022-07-19 DIAGNOSIS — M546 Pain in thoracic spine: Secondary | ICD-10-CM

## 2022-07-19 MED ORDER — AMPHETAMINE-DEXTROAMPHETAMINE 30 MG PO TABS: 1.0000 | ORAL_TABLET | Freq: Two times a day (BID) | ORAL | 0 refills | Status: DC

## 2022-07-19 NOTE — Telephone Encounter (Signed)
Pt called and needs a refill on her Adderall sent to [facility] [address]. This store currently has this in stock
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
## 2022-07-19 NOTE — Telephone Encounter (Signed)
Adderall rx sent to [facility] per patient request.

## 2022-07-20 ENCOUNTER — Encounter (HOSPITAL_BASED_OUTPATIENT_CLINIC_OR_DEPARTMENT_OTHER): Payer: Self-pay | Admitting: Physical Therapy

## 2022-07-21 ENCOUNTER — Ambulatory Visit (HOSPITAL_BASED_OUTPATIENT_CLINIC_OR_DEPARTMENT_OTHER): Payer: Medicare Other | Admitting: Physical Therapy

## 2022-07-21 DIAGNOSIS — R293 Abnormal posture: Secondary | ICD-10-CM

## 2022-07-21 DIAGNOSIS — M545 Low back pain, unspecified: Secondary | ICD-10-CM

## 2022-07-21 DIAGNOSIS — M5459 Other low back pain: Secondary | ICD-10-CM | POA: Diagnosis not present

## 2022-07-21 DIAGNOSIS — M546 Pain in thoracic spine: Secondary | ICD-10-CM

## 2022-07-21 DIAGNOSIS — R2689 Other abnormalities of gait and mobility: Secondary | ICD-10-CM
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
## 2022-07-26 ENCOUNTER — Ambulatory Visit (HOSPITAL_BASED_OUTPATIENT_CLINIC_OR_DEPARTMENT_OTHER): Payer: Medicare Other | Admitting: Physical Therapy

## 2022-07-27 ENCOUNTER — Encounter: Payer: Self-pay | Admitting: *Deleted

## 2022-07-28 ENCOUNTER — Ambulatory Visit (HOSPITAL_BASED_OUTPATIENT_CLINIC_OR_DEPARTMENT_OTHER): Payer: Medicare Other | Admitting: Physical Therapy

## 2022-07-28 ENCOUNTER — Encounter (HOSPITAL_BASED_OUTPATIENT_CLINIC_OR_DEPARTMENT_OTHER): Payer: Self-pay | Admitting: Physical Therapy

## 2022-07-28 DIAGNOSIS — M5459 Other low back pain: Secondary | ICD-10-CM | POA: Diagnosis not present

## 2022-07-28 DIAGNOSIS — R2689 Other abnormalities of gait and mobility: Secondary | ICD-10-CM

## 2022-07-28 DIAGNOSIS — M546 Pain in thoracic spine: Secondary | ICD-10-CM

## 2022-07-28 DIAGNOSIS — R293 Abnormal posture: Secondary | ICD-10-CM
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
## 2022-07-29 ENCOUNTER — Ambulatory Visit (HOSPITAL_BASED_OUTPATIENT_CLINIC_OR_DEPARTMENT_OTHER): Payer: Medicare Other | Admitting: Physical Therapy

## 2022-07-29 ENCOUNTER — Encounter (HOSPITAL_BASED_OUTPATIENT_CLINIC_OR_DEPARTMENT_OTHER): Payer: Self-pay | Admitting: Physical Therapy

## 2022-07-29 DIAGNOSIS — M545 Low back pain, unspecified: Secondary | ICD-10-CM

## 2022-07-29 DIAGNOSIS — R293 Abnormal posture: Secondary | ICD-10-CM

## 2022-07-29 DIAGNOSIS — R2689 Other abnormalities of gait and mobility: Secondary | ICD-10-CM

## 2022-07-29 DIAGNOSIS — M5459 Other low back pain: Secondary | ICD-10-CM

## 2022-07-29 DIAGNOSIS — M546 Pain in thoracic spine: Secondary | ICD-10-CM
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
## 2022-07-30 DIAGNOSIS — H60321 Hemorrhagic otitis externa, right ear: Secondary | ICD-10-CM | POA: Insufficient documentation

## 2022-07-30 DIAGNOSIS — R051 Acute cough: Secondary | ICD-10-CM | POA: Insufficient documentation

## 2022-07-30 NOTE — Assessment & Plan Note (Signed)
Chronic. Control is less than optimal, but she is on a maximum dose. No alarm symptoms or side effects. Will send refill today.
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
## 2022-07-30 NOTE — Assessment & Plan Note (Signed)
Suspect viral infection triggered cough and ear effusion. No fever, sinus pressure, wheezing at this time. Will send tessalon for management. If symptoms persist, will consider antibiotic therapy.
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
## 2022-07-30 NOTE — Assessment & Plan Note (Signed)
Chronic. Seroquel is working well for her without alarm sx. Refills provided today.

## 2022-07-30 NOTE — Assessment & Plan Note (Signed)
Bloody drainage with non-intact TM in the right ear. Will treat with otic antibiotics and supportive care. Recommend keeping the ear dry and covered for at least 7 days. If new or worsening symptoms present, she will follow-up.

## 2022-08-05 ENCOUNTER — Ambulatory Visit (INDEPENDENT_AMBULATORY_CARE_PROVIDER_SITE_OTHER): Payer: Medicare Other | Admitting: Physician Assistant

## 2022-08-05 ENCOUNTER — Other Ambulatory Visit (INDEPENDENT_AMBULATORY_CARE_PROVIDER_SITE_OTHER): Payer: Medicare Other

## 2022-08-05 ENCOUNTER — Encounter: Payer: Self-pay | Admitting: Physician Assistant

## 2022-08-05 VITALS — BP 120/68 | HR 77 | Ht 64.0 in | Wt 110.0 lb

## 2022-08-05 DIAGNOSIS — R1310 Dysphagia, unspecified: Secondary | ICD-10-CM

## 2022-08-05 DIAGNOSIS — K219 Gastro-esophageal reflux disease without esophagitis: Secondary | ICD-10-CM

## 2022-08-05 DIAGNOSIS — D509 Iron deficiency anemia, unspecified: Secondary | ICD-10-CM

## 2022-08-05 DIAGNOSIS — K5900 Constipation, unspecified: Secondary | ICD-10-CM

## 2022-08-05 LAB — CBC WITH DIFFERENTIAL/PLATELET
Basophils Absolute: 0 10*3/uL (ref 0.0–0.1)
Basophils Relative: 0.6 % (ref 0.0–3.0)
Eosinophils Absolute: 0.3 10*3/uL (ref 0.0–0.7)
Eosinophils Relative: 5 % (ref 0.0–5.0)
HCT: 38.6 % (ref 36.0–46.0)
Hemoglobin: 12.8 g/dL (ref 12.0–15.0)
Lymphocytes Relative: 26.7 % (ref 12.0–46.0)
Lymphs Abs: 1.5 10*3/uL (ref 0.7–4.0)
MCHC: 33.1 g/dL (ref 30.0–36.0)
MCV: 87.2 fl (ref 78.0–100.0)
Monocytes Absolute: 0.5 10*3/uL (ref 0.1–1.0)
Monocytes Relative: 8.4 % (ref 3.0–12.0)
Neutro Abs: 3.2 10*3/uL (ref 1.4–7.7)
Neutrophils Relative %: 59.3 % (ref 43.0–77.0)
Platelets: 256 10*3/uL (ref 150.0–400.0)
RBC: 4.43 Mil/uL (ref 3.87–5.11)
RDW: 15.1 % (ref 11.5–15.5)
WBC: 5.5 10*3/uL (ref 4.0–10.5)

## 2022-08-05 LAB — COMPREHENSIVE METABOLIC PANEL
ALT: 16 U/L (ref 0–35)
AST: 18 U/L (ref 0–37)
Albumin: 4.4 g/dL (ref 3.5–5.2)
Alkaline Phosphatase: 54 U/L (ref 39–117)
BUN: 18 mg/dL (ref 6–23)
CO2: 32 mEq/L (ref 19–32)
Calcium: 9.6 mg/dL (ref 8.4–10.5)
Chloride: 102 mEq/L (ref 96–112)
Creatinine, Ser: 0.69 mg/dL (ref 0.40–1.20)
GFR: 90.93 mL/min (ref >60.00)
Glucose, Bld: 102 mg/dL — ABNORMAL HIGH (ref 70–99)
Potassium: 4.7 mEq/L (ref 3.5–5.1)
Sodium: 141 mEq/L (ref 135–145)
Total Bilirubin: 0.3 mg/dL (ref 0.2–1.2)
Total Protein: 6.3 g/dL (ref 6.0–8.3)

## 2022-08-05 LAB — B12 AND FOLATE PANEL
Folate: 22.2 ng/mL (ref >5.9)
Vitamin B-12: 369 pg/mL (ref 211–911)

## 2022-08-05 LAB — IBC + FERRITIN
Ferritin: 11.5 ng/mL (ref 10.0–291.0)
Iron: 62 ug/dL (ref 42–145)
Saturation Ratios: 14.7 % — ABNORMAL LOW (ref 20.0–50.0)
TIBC: 421.4 ug/dL (ref 250.0–450.0)
Transferrin: 301 mg/dL (ref 212.0–360.0)

## 2022-08-05 MED ORDER — FAMOTIDINE 40 MG PO TABS: 40.0000 mg | ORAL_TABLET | Freq: Every day | ORAL | 11 refills | Status: DC

## 2022-08-05 NOTE — Patient Instructions (Addendum)
_______________________________________________________  If you are age 66 or older, your body mass index should be between 23-30. Your Body mass index is 18.88 kg/m. If this is out of the aforementioned range listed, please consider follow up with your Primary Care Provider.  If you are age 8 or younger, your body mass index should be between 19-25. Your Body mass index is 18.88 kg/m. If this is out of the aformentioned range listed, please consider follow up with your Primary Care Provider.   ________________________________________________________  The [facility] providers would like to encourage you to use [facility] to communicate with providers for non-urgent requests or questions.  Due to long hold times on the telephone, sending your provider a message by [facility] may be a faster and more efficient way to get a response.  Please allow 48 business hours for a response.  Please remember that this is for non-urgent requests.  _______________________________________________________   We have sent the following medications to your pharmacy for you to pick up at your convenience: Famotidine 40 mg take twice daily   Increase Miralax to Daily   Your provider has requested that you go to the basement level for lab work before leaving today. Press "B" on the elevator. The lab is located at the first door on the left as you exit the elevator.
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
## 2022-08-07 NOTE — Progress Notes (Signed)
Agree with assessment/plan.  Raj Alianny Toelle, MD [facility] [phone]  

## 2022-08-11 ENCOUNTER — Ambulatory Visit (HOSPITAL_BASED_OUTPATIENT_CLINIC_OR_DEPARTMENT_OTHER): Payer: Medicare Other | Admitting: Physical Therapy

## 2022-08-15 ENCOUNTER — Encounter (HOSPITAL_BASED_OUTPATIENT_CLINIC_OR_DEPARTMENT_OTHER): Payer: Self-pay | Admitting: Physical Therapy

## 2022-08-15 ENCOUNTER — Ambulatory Visit (HOSPITAL_BASED_OUTPATIENT_CLINIC_OR_DEPARTMENT_OTHER): Payer: Medicare Other | Attending: Physician Assistant | Admitting: Physical Therapy

## 2022-08-15 ENCOUNTER — Telehealth: Payer: Self-pay | Admitting: Nurse Practitioner

## 2022-08-15 DIAGNOSIS — M5459 Other low back pain: Secondary | ICD-10-CM | POA: Insufficient documentation

## 2022-08-15 DIAGNOSIS — R2689 Other abnormalities of gait and mobility: Secondary | ICD-10-CM | POA: Diagnosis present

## 2022-08-15 DIAGNOSIS — F900 Attention-deficit hyperactivity disorder, predominantly inattentive type: Secondary | ICD-10-CM

## 2022-08-15 DIAGNOSIS — M546 Pain in thoracic spine: Secondary | ICD-10-CM | POA: Insufficient documentation

## 2022-08-15 DIAGNOSIS — R293 Abnormal posture: Secondary | ICD-10-CM | POA: Insufficient documentation

## 2022-08-15 NOTE — Telephone Encounter (Signed)
Pt called and is requesting a refill on her adderall [facility] to the  [facility] [address], [place],  [address] [facility] [address] [facility] of [facility]
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
## 2022-08-16 ENCOUNTER — Encounter (HOSPITAL_BASED_OUTPATIENT_CLINIC_OR_DEPARTMENT_OTHER): Payer: Self-pay | Admitting: Physical Therapy

## 2022-08-16 NOTE — Telephone Encounter (Signed)
Pt called and is requesting a refill on her adderall [facility] to [facility] [address] [place], [place] [address]  [facility] [address] [facility] and she says a lot of places are going out of stock and she doesn't want [facility] to be taken from [facility] [address] before she can get to it.

## 2022-08-17 MED ORDER — AMPHETAMINE-DEXTROAMPHETAMINE 30 MG PO TABS: 1.0000 | ORAL_TABLET | Freq: Two times a day (BID) | ORAL | 0 refills | Status: DC

## 2022-08-17 NOTE — Addendum Note (Signed)
Addended by: Adore Kithcart, Clarise Cruz E on: 08/17/2022 02:49 PM   Modules accepted: Orders

## 2022-08-18 ENCOUNTER — Ambulatory Visit: Payer: Medicare Other | Admitting: Nurse Practitioner

## 2022-08-18 ENCOUNTER — Ambulatory Visit (HOSPITAL_BASED_OUTPATIENT_CLINIC_OR_DEPARTMENT_OTHER): Payer: Medicare Other | Admitting: Physical Therapy

## 2022-08-23 ENCOUNTER — Ambulatory Visit (HOSPITAL_BASED_OUTPATIENT_CLINIC_OR_DEPARTMENT_OTHER): Payer: Medicare Other | Admitting: Physical Therapy

## 2022-08-25 ENCOUNTER — Ambulatory Visit (HOSPITAL_BASED_OUTPATIENT_CLINIC_OR_DEPARTMENT_OTHER): Payer: Medicare Other | Admitting: Physical Therapy

## 2022-08-25 ENCOUNTER — Encounter: Payer: Medicare Other | Admitting: Nurse Practitioner

## 2022-08-26 ENCOUNTER — Telehealth: Payer: Self-pay

## 2022-08-26 NOTE — Telephone Encounter (Signed)
Patient received letter and called into the office today. We reviewed labs from 08/05/22. Pt verbalized understanding and had no concerns at the end of the call.

## 2022-08-30 ENCOUNTER — Encounter (HOSPITAL_BASED_OUTPATIENT_CLINIC_OR_DEPARTMENT_OTHER): Payer: Self-pay | Admitting: Physical Therapy

## 2022-08-30 ENCOUNTER — Ambulatory Visit (HOSPITAL_BASED_OUTPATIENT_CLINIC_OR_DEPARTMENT_OTHER): Payer: Medicare Other | Admitting: Physical Therapy

## 2022-08-30 DIAGNOSIS — R293 Abnormal posture: Secondary | ICD-10-CM

## 2022-08-30 DIAGNOSIS — R2689 Other abnormalities of gait and mobility: Secondary | ICD-10-CM

## 2022-08-30 DIAGNOSIS — M546 Pain in thoracic spine: Secondary | ICD-10-CM

## 2022-08-30 DIAGNOSIS — M5459 Other low back pain: Secondary | ICD-10-CM
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
## 2022-09-01 ENCOUNTER — Ambulatory Visit (HOSPITAL_BASED_OUTPATIENT_CLINIC_OR_DEPARTMENT_OTHER): Payer: Medicare Other | Attending: Physician Assistant | Admitting: Physical Therapy

## 2022-09-01 ENCOUNTER — Encounter (HOSPITAL_BASED_OUTPATIENT_CLINIC_OR_DEPARTMENT_OTHER): Payer: Self-pay | Admitting: Physical Therapy

## 2022-09-01 DIAGNOSIS — G8929 Other chronic pain: Secondary | ICD-10-CM | POA: Diagnosis present

## 2022-09-01 DIAGNOSIS — R2689 Other abnormalities of gait and mobility: Secondary | ICD-10-CM | POA: Insufficient documentation

## 2022-09-01 DIAGNOSIS — M5459 Other low back pain: Secondary | ICD-10-CM | POA: Diagnosis present

## 2022-09-01 DIAGNOSIS — R293 Abnormal posture: Secondary | ICD-10-CM

## 2022-09-01 DIAGNOSIS — M545 Low back pain, unspecified: Secondary | ICD-10-CM | POA: Insufficient documentation

## 2022-09-01 DIAGNOSIS — M546 Pain in thoracic spine: Secondary | ICD-10-CM | POA: Diagnosis present
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
## 2022-09-06 ENCOUNTER — Encounter (HOSPITAL_BASED_OUTPATIENT_CLINIC_OR_DEPARTMENT_OTHER): Payer: Medicare Other | Admitting: Physical Therapy

## 2022-09-08 ENCOUNTER — Telehealth (HOSPITAL_BASED_OUTPATIENT_CLINIC_OR_DEPARTMENT_OTHER): Payer: Self-pay | Admitting: Physical Therapy

## 2022-09-08 ENCOUNTER — Ambulatory Visit (HOSPITAL_BASED_OUTPATIENT_CLINIC_OR_DEPARTMENT_OTHER): Payer: Medicare Other | Admitting: Physical Therapy
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
## 2022-09-12 ENCOUNTER — Ambulatory Visit (INDEPENDENT_AMBULATORY_CARE_PROVIDER_SITE_OTHER): Payer: Medicare Other | Admitting: Nurse Practitioner

## 2022-09-12 ENCOUNTER — Encounter: Payer: Self-pay | Admitting: Nurse Practitioner

## 2022-09-12 VITALS — BP 120/72 | HR 78 | Wt 110.6 lb

## 2022-09-12 DIAGNOSIS — L719 Rosacea, unspecified: Secondary | ICD-10-CM

## 2022-09-12 DIAGNOSIS — F332 Major depressive disorder, recurrent severe without psychotic features: Secondary | ICD-10-CM

## 2022-09-12 DIAGNOSIS — N3090 Cystitis, unspecified without hematuria: Secondary | ICD-10-CM

## 2022-09-12 DIAGNOSIS — R309 Painful micturition, unspecified: Secondary | ICD-10-CM | POA: Insufficient documentation

## 2022-09-12 DIAGNOSIS — F5104 Psychophysiologic insomnia: Secondary | ICD-10-CM | POA: Diagnosis not present

## 2022-09-12 DIAGNOSIS — F900 Attention-deficit hyperactivity disorder, predominantly inattentive type: Secondary | ICD-10-CM | POA: Diagnosis not present

## 2022-09-12 DIAGNOSIS — G894 Chronic pain syndrome: Secondary | ICD-10-CM

## 2022-09-12 LAB — POCT URINALYSIS DIP (CLINITEK)
Bilirubin, UA: NEGATIVE
Glucose, UA: NEGATIVE mg/dL
Ketones, POC UA: NEGATIVE mg/dL
Nitrite, UA: NEGATIVE
POC PROTEIN,UA: 30 — AB
Spec Grav, UA: 1.025 (ref 1.010–1.025)
Urobilinogen, UA: 0.2 E.U./dL
pH, UA: 6.5 (ref 5.0–8.0)

## 2022-09-12 MED ORDER — QUETIAPINE FUMARATE 25 MG PO TABS: 25.0000 mg | ORAL_TABLET | Freq: Every day | ORAL | 3 refills | Status: DC

## 2022-09-12 MED ORDER — TRETINOIN 0.01 % EX GEL: Freq: Every day | CUTANEOUS | 0 refills | Status: DC

## 2022-09-12 MED ORDER — SULFAMETHOXAZOLE-TRIMETHOPRIM 800-160 MG PO TABS: 1.0000 | ORAL_TABLET | Freq: Two times a day (BID) | ORAL | 0 refills | Status: DC

## 2022-09-12 MED ORDER — AMPHETAMINE-DEXTROAMPHETAMINE 30 MG PO TABS: 30.0000 mg | ORAL_TABLET | Freq: Two times a day (BID) | ORAL | 0 refills | Status: DC

## 2022-09-12 MED ORDER — IBUPROFEN 600 MG PO TABS: 600.0000 mg | ORAL_TABLET | Freq: Three times a day (TID) | ORAL | 2 refills | Status: DC | PRN

## 2022-09-12 MED ORDER — TRETINOIN 0.1 % EX CREA: TOPICAL_CREAM | Freq: Every day | CUTANEOUS | 3 refills | Status: DC

## 2022-09-12 MED ORDER — AMPHETAMINE-DEXTROAMPHETAMINE 30 MG PO TABS: 1.0000 | ORAL_TABLET | Freq: Two times a day (BID) | ORAL | 0 refills | Status: DC

## 2022-09-12 NOTE — Assessment & Plan Note (Signed)
Chronic. Stable at this time. Currently managed with adderall 57m BID. Will plan to continue current dose and monitor closely. Refills provided today.

## 2022-09-12 NOTE — Assessment & Plan Note (Signed)
UA positive for blood, leukocytes, and protein. Will send treatment with bactrim.

## 2022-09-12 NOTE — Progress Notes (Signed)
  Orma Render, DNP, AGNP-c [place], [place] [address] [phone]  Subjective:   Deborah [name] is a 67 y.o. female presents to day for evaluation of: Dysuria Delany reports dysuria that has been present for the last few days with suprapubic pressure. Today she has noticed the pain is getting worse. She is not having any fevers. She has chronic low back pain, but it is difficult for her to tell if this is worse.  PMH, Medications, and Allergies reviewed and updated in chart as appropriate.   ROS negative except for what is listed in HPI. Objective:  BP 120/72   Pulse 78   Wt 110 lb 9.6 oz (50.2 kg)   BMI 18.98 kg/m  Physical Exam Vitals and nursing note reviewed.  Constitutional:      Appearance: Normal appearance.  HENT:     Head: Normocephalic.  Cardiovascular:     Rate and Rhythm: Normal rate and regular rhythm.     Pulses: Normal pulses.     Heart sounds: Normal heart sounds.  Pulmonary:     Effort: Pulmonary effort is normal.     Breath sounds: Normal breath sounds.  Abdominal:     General: Bowel sounds are normal. There is no distension.     Palpations: Abdomen is soft.     Tenderness: There is abdominal tenderness. There is no right CVA tenderness, left CVA tenderness or guarding.  Skin:    General: Skin is warm and dry.  Neurological:     General: No focal deficit present.     Mental Status: She is alert.           Assessment & Plan:   Problem List Items Addressed This Visit     Attention deficit hyperactivity disorder (ADHD), predominantly inattentive type    Chronic. Stable at this time. Currently managed with adderall 30mg  BID. Will plan to continue current dose and monitor closely. Refills provided today.       Relevant Medications   amphetamine-dextroamphetamine (ADDERALL) 30 MG tablet   amphetamine-dextroamphetamine (ADDERALL) 30 MG tablet (Start on 10/10/2022)   Pain with urination -  Primary    UA positive for blood, leukocytes, and protein. Will send treatment with bactrim.       Relevant Medications   sulfamethoxazole-trimethoprim (BACTRIM DS) 800-160 MG tablet   INSOMNIA   Relevant Medications   QUEtiapine (SEROQUEL) 25 MG tablet   MDD (major depressive disorder), recurrent severe, without psychosis (HCC)   Relevant Medications   QUEtiapine (SEROQUEL) 25 MG tablet   Chronic pain syndrome   Relevant Medications   ibuprofen (ADVIL) 600 MG tablet   Rosacea   Relevant Medications   tretinoin (RETIN-A) 0.1 % cream   tretinoin (RETIN-A) 0.01 % gel   Refills provided.    Orma Render, DNP, AGNP-c 09/12/2022  6:08 PM    History, Medications, Surgery, SDOH, and Family History reviewed and updated as appropriate.

## 2022-09-13 ENCOUNTER — Encounter (HOSPITAL_BASED_OUTPATIENT_CLINIC_OR_DEPARTMENT_OTHER): Payer: Self-pay | Admitting: Physical Therapy

## 2022-09-13 ENCOUNTER — Ambulatory Visit (HOSPITAL_BASED_OUTPATIENT_CLINIC_OR_DEPARTMENT_OTHER): Payer: Medicare Other | Admitting: Physical Therapy

## 2022-09-13 DIAGNOSIS — R293 Abnormal posture: Secondary | ICD-10-CM

## 2022-09-13 DIAGNOSIS — M5459 Other low back pain: Secondary | ICD-10-CM

## 2022-09-13 DIAGNOSIS — R2689 Other abnormalities of gait and mobility: Secondary | ICD-10-CM

## 2022-09-13 DIAGNOSIS — M546 Pain in thoracic spine: Secondary | ICD-10-CM

## 2022-09-13 DIAGNOSIS — G8929 Other chronic pain: Secondary | ICD-10-CM
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
## 2022-09-15 ENCOUNTER — Ambulatory Visit (HOSPITAL_BASED_OUTPATIENT_CLINIC_OR_DEPARTMENT_OTHER): Payer: Medicare Other | Admitting: Physical Therapy

## 2022-09-15 ENCOUNTER — Encounter: Payer: Medicare Other | Admitting: Nurse Practitioner

## 2022-09-15 ENCOUNTER — Encounter (HOSPITAL_BASED_OUTPATIENT_CLINIC_OR_DEPARTMENT_OTHER): Payer: Self-pay | Admitting: Physical Therapy

## 2022-09-15 DIAGNOSIS — M546 Pain in thoracic spine: Secondary | ICD-10-CM

## 2022-09-15 DIAGNOSIS — M5459 Other low back pain: Secondary | ICD-10-CM | POA: Diagnosis not present

## 2022-09-15 DIAGNOSIS — R293 Abnormal posture: Secondary | ICD-10-CM

## 2022-09-15 DIAGNOSIS — R2689 Other abnormalities of gait and mobility: Secondary | ICD-10-CM
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
## 2022-09-16 ENCOUNTER — Encounter (HOSPITAL_BASED_OUTPATIENT_CLINIC_OR_DEPARTMENT_OTHER): Payer: Self-pay | Admitting: Physical Therapy

## 2022-09-20 ENCOUNTER — Ambulatory Visit (HOSPITAL_BASED_OUTPATIENT_CLINIC_OR_DEPARTMENT_OTHER): Payer: Medicare Other | Admitting: Physical Therapy

## 2022-09-20 DIAGNOSIS — M5459 Other low back pain: Secondary | ICD-10-CM | POA: Diagnosis not present

## 2022-09-20 DIAGNOSIS — M546 Pain in thoracic spine: Secondary | ICD-10-CM

## 2022-09-20 DIAGNOSIS — R293 Abnormal posture: Secondary | ICD-10-CM

## 2022-09-20 DIAGNOSIS — R2689 Other abnormalities of gait and mobility: Secondary | ICD-10-CM

## 2022-09-21 ENCOUNTER — Encounter (HOSPITAL_BASED_OUTPATIENT_CLINIC_OR_DEPARTMENT_OTHER): Payer: Self-pay | Admitting: Physical Therapy
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
## 2022-09-22 ENCOUNTER — Encounter (HOSPITAL_BASED_OUTPATIENT_CLINIC_OR_DEPARTMENT_OTHER): Payer: Medicare Other | Admitting: Physical Therapy

## 2022-09-27 ENCOUNTER — Ambulatory Visit (HOSPITAL_BASED_OUTPATIENT_CLINIC_OR_DEPARTMENT_OTHER): Payer: Medicare Other | Admitting: Physical Therapy

## 2022-09-28 ENCOUNTER — Telehealth: Payer: Self-pay | Admitting: Nurse Practitioner

## 2022-09-28 NOTE — Telephone Encounter (Signed)
Pt called and states that she lost her adderall she is going to short a week she wants to know if you will send her  some I informed her that she will pay for it out of pocket she was ok with that  She lost more medicine but it wasn't from out office and her keys to her car and is still looking for everything         [id number]- [place], [place] - [address]

## 2022-09-29 ENCOUNTER — Ambulatory Visit (HOSPITAL_BASED_OUTPATIENT_CLINIC_OR_DEPARTMENT_OTHER): Payer: Medicare Other | Admitting: Physical Therapy

## 2022-10-04 ENCOUNTER — Telehealth: Payer: Self-pay | Admitting: Nurse Practitioner

## 2022-10-04 ENCOUNTER — Ambulatory Visit (HOSPITAL_BASED_OUTPATIENT_CLINIC_OR_DEPARTMENT_OTHER): Payer: Medicare Other | Attending: Physician Assistant | Admitting: Physical Therapy

## 2022-10-04 DIAGNOSIS — G8929 Other chronic pain: Secondary | ICD-10-CM | POA: Insufficient documentation

## 2022-10-04 DIAGNOSIS — M545 Low back pain, unspecified: Secondary | ICD-10-CM | POA: Insufficient documentation

## 2022-10-04 DIAGNOSIS — R293 Abnormal posture: Secondary | ICD-10-CM | POA: Insufficient documentation

## 2022-10-04 DIAGNOSIS — M5459 Other low back pain: Secondary | ICD-10-CM | POA: Insufficient documentation

## 2022-10-04 DIAGNOSIS — R2689 Other abnormalities of gait and mobility: Secondary | ICD-10-CM | POA: Insufficient documentation

## 2022-10-04 DIAGNOSIS — M546 Pain in thoracic spine: Secondary | ICD-10-CM | POA: Insufficient documentation

## 2022-10-04 NOTE — Telephone Encounter (Signed)
Please call the patient to let her know that I am unable to fill her Adderall Deborah [name].  Her next dose should be available no earlier than 10/10/2022.   This medication is strictly regulated and I am unable to provide Deborah [name] fills.   According to the Prescription Drug Database, her most recent prescription was filled 09/16/2022 with 60 tablets with Rx number [id number].  Given this information, the prescription should last through 10/15/2022 based on the number of days in the month.

## 2022-10-04 NOTE — Telephone Encounter (Signed)
Pt called and I informed her you sent her adderall to start 10/10/22 but she is saying she will be out on 10/08/2022.

## 2022-10-06 ENCOUNTER — Ambulatory Visit (HOSPITAL_BASED_OUTPATIENT_CLINIC_OR_DEPARTMENT_OTHER): Payer: Medicare Other | Admitting: Physical Therapy

## 2022-10-06 ENCOUNTER — Telehealth: Payer: Self-pay

## 2022-10-06 DIAGNOSIS — G8929 Other chronic pain: Secondary | ICD-10-CM

## 2022-10-06 DIAGNOSIS — R2689 Other abnormalities of gait and mobility: Secondary | ICD-10-CM | POA: Diagnosis present

## 2022-10-06 DIAGNOSIS — M545 Low back pain, unspecified: Secondary | ICD-10-CM | POA: Diagnosis present

## 2022-10-06 DIAGNOSIS — M546 Pain in thoracic spine: Secondary | ICD-10-CM

## 2022-10-06 DIAGNOSIS — M5459 Other low back pain: Secondary | ICD-10-CM | POA: Diagnosis present

## 2022-10-06 DIAGNOSIS — R293 Abnormal posture: Secondary | ICD-10-CM | POA: Diagnosis present

## 2022-10-06 NOTE — Telephone Encounter (Signed)
Pt. Called back again stating she has searched everywhere and still can not find them anywhere. She stats she has only three pills left so enough for today and tomorrow. I told her I would have to ask you but you might not be able to because of the regulations. She said the pharmacy told her you would have to call it into the pharmacy and ok the early refill.
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
## 2022-10-07 ENCOUNTER — Encounter (HOSPITAL_BASED_OUTPATIENT_CLINIC_OR_DEPARTMENT_OTHER): Payer: Self-pay | Admitting: Physical Therapy

## 2022-10-10 NOTE — Telephone Encounter (Signed)
If patient calls again, please direct her call to [facility]. We are unable to provide replacement prescriptions for controlled substances and she has been made aware of this several times.

## 2022-10-11 ENCOUNTER — Ambulatory Visit (HOSPITAL_BASED_OUTPATIENT_CLINIC_OR_DEPARTMENT_OTHER): Payer: Medicare Other | Admitting: Physical Therapy

## 2022-10-11 DIAGNOSIS — M546 Pain in thoracic spine: Secondary | ICD-10-CM

## 2022-10-11 DIAGNOSIS — R293 Abnormal posture: Secondary | ICD-10-CM

## 2022-10-11 DIAGNOSIS — R2689 Other abnormalities of gait and mobility: Secondary | ICD-10-CM

## 2022-10-11 DIAGNOSIS — M5459 Other low back pain: Secondary | ICD-10-CM | POA: Diagnosis not present

## 2022-10-12 ENCOUNTER — Encounter (HOSPITAL_BASED_OUTPATIENT_CLINIC_OR_DEPARTMENT_OTHER): Payer: Self-pay | Admitting: Physical Therapy
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
## 2022-10-13 ENCOUNTER — Ambulatory Visit (HOSPITAL_BASED_OUTPATIENT_CLINIC_OR_DEPARTMENT_OTHER): Payer: Medicare Other | Admitting: Physical Therapy

## 2022-10-13 ENCOUNTER — Encounter (HOSPITAL_BASED_OUTPATIENT_CLINIC_OR_DEPARTMENT_OTHER): Payer: Self-pay | Admitting: Physical Therapy

## 2022-10-13 DIAGNOSIS — M5459 Other low back pain: Secondary | ICD-10-CM

## 2022-10-13 DIAGNOSIS — R2689 Other abnormalities of gait and mobility: Secondary | ICD-10-CM

## 2022-10-13 DIAGNOSIS — R293 Abnormal posture: Secondary | ICD-10-CM

## 2022-10-13 DIAGNOSIS — M546 Pain in thoracic spine: Secondary | ICD-10-CM
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
## 2022-10-18 ENCOUNTER — Other Ambulatory Visit (HOSPITAL_BASED_OUTPATIENT_CLINIC_OR_DEPARTMENT_OTHER): Payer: Self-pay | Admitting: Nurse Practitioner

## 2022-10-18 DIAGNOSIS — E559 Vitamin D deficiency, unspecified: Secondary | ICD-10-CM

## 2022-10-19 ENCOUNTER — Encounter (HOSPITAL_BASED_OUTPATIENT_CLINIC_OR_DEPARTMENT_OTHER): Payer: Medicare Other | Admitting: Physical Therapy

## 2022-10-21 ENCOUNTER — Ambulatory Visit (HOSPITAL_BASED_OUTPATIENT_CLINIC_OR_DEPARTMENT_OTHER): Payer: Medicare Other | Admitting: Physical Therapy

## 2022-10-25 ENCOUNTER — Ambulatory Visit (HOSPITAL_BASED_OUTPATIENT_CLINIC_OR_DEPARTMENT_OTHER): Payer: Medicare Other | Admitting: Physical Therapy

## 2022-10-25 ENCOUNTER — Encounter (HOSPITAL_BASED_OUTPATIENT_CLINIC_OR_DEPARTMENT_OTHER): Payer: Self-pay | Admitting: Physical Therapy

## 2022-10-25 DIAGNOSIS — M5459 Other low back pain: Secondary | ICD-10-CM

## 2022-10-25 DIAGNOSIS — M546 Pain in thoracic spine: Secondary | ICD-10-CM

## 2022-10-25 DIAGNOSIS — R2689 Other abnormalities of gait and mobility: Secondary | ICD-10-CM

## 2022-10-25 DIAGNOSIS — R293 Abnormal posture: Secondary | ICD-10-CM
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
## 2022-10-26 ENCOUNTER — Encounter (HOSPITAL_BASED_OUTPATIENT_CLINIC_OR_DEPARTMENT_OTHER): Payer: Self-pay | Admitting: Physical Therapy

## 2022-10-27 ENCOUNTER — Encounter (HOSPITAL_BASED_OUTPATIENT_CLINIC_OR_DEPARTMENT_OTHER): Payer: Self-pay | Admitting: Physical Therapy

## 2022-10-27 ENCOUNTER — Ambulatory Visit (HOSPITAL_BASED_OUTPATIENT_CLINIC_OR_DEPARTMENT_OTHER): Payer: Medicare Other | Admitting: Physical Therapy

## 2022-10-27 DIAGNOSIS — R2689 Other abnormalities of gait and mobility: Secondary | ICD-10-CM

## 2022-10-27 DIAGNOSIS — M546 Pain in thoracic spine: Secondary | ICD-10-CM

## 2022-10-27 DIAGNOSIS — M5459 Other low back pain: Secondary | ICD-10-CM

## 2022-10-27 DIAGNOSIS — M545 Low back pain, unspecified: Secondary | ICD-10-CM

## 2022-10-27 DIAGNOSIS — R293 Abnormal posture: Secondary | ICD-10-CM
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
## 2022-11-01 ENCOUNTER — Ambulatory Visit (HOSPITAL_BASED_OUTPATIENT_CLINIC_OR_DEPARTMENT_OTHER): Payer: Medicare Other | Attending: Physician Assistant | Admitting: Physical Therapy

## 2022-11-01 ENCOUNTER — Encounter (HOSPITAL_BASED_OUTPATIENT_CLINIC_OR_DEPARTMENT_OTHER): Payer: Self-pay | Admitting: Physical Therapy

## 2022-11-01 DIAGNOSIS — R2689 Other abnormalities of gait and mobility: Secondary | ICD-10-CM | POA: Diagnosis present

## 2022-11-01 DIAGNOSIS — R293 Abnormal posture: Secondary | ICD-10-CM

## 2022-11-01 DIAGNOSIS — G8929 Other chronic pain: Secondary | ICD-10-CM | POA: Insufficient documentation

## 2022-11-01 DIAGNOSIS — M5459 Other low back pain: Secondary | ICD-10-CM

## 2022-11-01 DIAGNOSIS — M546 Pain in thoracic spine: Secondary | ICD-10-CM

## 2022-11-01 DIAGNOSIS — M545 Low back pain, unspecified: Secondary | ICD-10-CM | POA: Diagnosis present
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
## 2022-11-02 ENCOUNTER — Telehealth: Payer: Self-pay | Admitting: Nurse Practitioner

## 2022-11-02 NOTE — Telephone Encounter (Signed)
Called patient to schedule Medicare Annual Wellness Visit (AWV). Left message for patient to call back and schedule Medicare Annual Wellness Visit (AWV).  Last date of AWV: awvs 10/30/20 per [facility]   Please schedule an appointment at any time with [facility] [place].  If any questions, please contact me at [phone].  Thank you ,  Barkley Boards AWV direct phone # [phone]

## 2022-11-03 ENCOUNTER — Ambulatory Visit (HOSPITAL_BASED_OUTPATIENT_CLINIC_OR_DEPARTMENT_OTHER): Payer: Medicare Other | Admitting: Physical Therapy

## 2022-11-03 DIAGNOSIS — M5459 Other low back pain: Secondary | ICD-10-CM

## 2022-11-03 DIAGNOSIS — M546 Pain in thoracic spine: Secondary | ICD-10-CM

## 2022-11-03 DIAGNOSIS — R293 Abnormal posture: Secondary | ICD-10-CM

## 2022-11-03 DIAGNOSIS — R2689 Other abnormalities of gait and mobility: Secondary | ICD-10-CM

## 2022-11-04 ENCOUNTER — Ambulatory Visit (INDEPENDENT_AMBULATORY_CARE_PROVIDER_SITE_OTHER): Payer: Medicare Other | Admitting: Nurse Practitioner

## 2022-11-04 ENCOUNTER — Encounter (HOSPITAL_BASED_OUTPATIENT_CLINIC_OR_DEPARTMENT_OTHER): Payer: Self-pay | Admitting: Physical Therapy

## 2022-11-04 ENCOUNTER — Encounter: Payer: Self-pay | Admitting: Nurse Practitioner

## 2022-11-04 ENCOUNTER — Telehealth: Payer: Self-pay | Admitting: Nurse Practitioner

## 2022-11-04 VITALS — BP 110/74 | HR 88 | Wt 108.8 lb

## 2022-11-04 DIAGNOSIS — K219 Gastro-esophageal reflux disease without esophagitis: Secondary | ICD-10-CM | POA: Diagnosis not present

## 2022-11-04 DIAGNOSIS — L719 Rosacea, unspecified: Secondary | ICD-10-CM | POA: Diagnosis not present

## 2022-11-04 DIAGNOSIS — K5903 Drug induced constipation: Secondary | ICD-10-CM | POA: Diagnosis not present

## 2022-11-04 DIAGNOSIS — F900 Attention-deficit hyperactivity disorder, predominantly inattentive type: Secondary | ICD-10-CM

## 2022-11-04 DIAGNOSIS — T402X5A Adverse effect of other opioids, initial encounter: Secondary | ICD-10-CM

## 2022-11-04 MED ORDER — AMPHETAMINE-DEXTROAMPHETAMINE 30 MG PO TABS: 1.0000 | ORAL_TABLET | Freq: Two times a day (BID) | ORAL | 0 refills | Status: DC

## 2022-11-04 MED ORDER — LUBIPROSTONE 24 MCG PO CAPS: 24.0000 ug | ORAL_CAPSULE | Freq: Two times a day (BID) | ORAL | 3 refills | Status: DC

## 2022-11-04 MED ORDER — AMPHETAMINE-DEXTROAMPHETAMINE 30 MG PO TABS: 30.0000 mg | ORAL_TABLET | Freq: Two times a day (BID) | ORAL | 0 refills | Status: DC

## 2022-11-04 MED ORDER — TRETINOIN 0.025 % EX GEL: Freq: Every day | CUTANEOUS | 6 refills | Status: DC

## 2022-11-04 MED ORDER — FAMOTIDINE 40 MG PO TABS: 40.0000 mg | ORAL_TABLET | Freq: Two times a day (BID) | ORAL | 11 refills | Status: DC | PRN

## 2022-11-04 NOTE — Patient Instructions (Addendum)
I have sent in a medication Amtiza to help with the constipation.   Buy magnesium citrate from the pharmacy, over the counter, and drink 1/2 a bottle today to see if this gets things moving.   I have sent in refills of your famotidine. You can take this twice a day to help with the reflux.   I have also sent in the retin-a gel 0.025.
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
## 2022-11-04 NOTE — Telephone Encounter (Signed)
Pt called and says [facility] on [address] does currently have her adderall needed if you can send it in for her.

## 2022-11-07 NOTE — Telephone Encounter (Signed)
Medication was sent during her visit. Thank you.

## 2022-11-08 ENCOUNTER — Ambulatory Visit (HOSPITAL_BASED_OUTPATIENT_CLINIC_OR_DEPARTMENT_OTHER): Payer: Medicare Other | Admitting: Physical Therapy

## 2022-11-08 ENCOUNTER — Telehealth: Payer: Self-pay

## 2022-11-08 DIAGNOSIS — M546 Pain in thoracic spine: Secondary | ICD-10-CM | POA: Diagnosis not present

## 2022-11-08 DIAGNOSIS — R2689 Other abnormalities of gait and mobility: Secondary | ICD-10-CM

## 2022-11-08 DIAGNOSIS — R293 Abnormal posture: Secondary | ICD-10-CM
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
## 2022-11-08 NOTE — Telephone Encounter (Signed)
Pt called and advised [facility] does not have adderall in stock. She stated it is in stock at [facility] located at [address] and needs this month's prescription to be sent there instead.

## 2022-11-08 NOTE — Telephone Encounter (Signed)
Do you mind to call [facility] on [address] to see if they will be able to fill the prescription for the patient on Friday 11/11/2022  or Saturday 11/12/2022? This is the earliest she will be able to pick up the medication. They may not know if they will have enough in stock, but I would like to check before sending it to another pharmacy when the new pharmacy may be out of stock by the end of the week, as well.

## 2022-11-09 ENCOUNTER — Encounter (HOSPITAL_BASED_OUTPATIENT_CLINIC_OR_DEPARTMENT_OTHER): Payer: Self-pay | Admitting: Physical Therapy

## 2022-11-10 ENCOUNTER — Ambulatory Visit (HOSPITAL_BASED_OUTPATIENT_CLINIC_OR_DEPARTMENT_OTHER): Payer: Medicare Other | Admitting: Physical Therapy

## 2022-11-11 ENCOUNTER — Telehealth: Payer: Self-pay | Admitting: Nurse Practitioner

## 2022-11-11 NOTE — Assessment & Plan Note (Signed)
Refills needed. PDMP reviewed. Patient eligible for pick up earliest 11/04/2022. Three months sent in at 29 days apart. No Abie Cheek refills permitted.

## 2022-11-11 NOTE — Assessment & Plan Note (Signed)
Suspect that both chronic buprenorphine therapy and severe scoliosis both are causative factors for her chronic constipation. In addition, she has limited activity due to her pain and gait, which likely also contributes. She has been medicating with laxatives without significant relief. We discussed alternatives that may be helpful including restart of amitiza and the use of magnesium citrate solution. For ongoing management, I suggest increased hydration and fiber to help with bulk.

## 2022-11-11 NOTE — Telephone Encounter (Signed)
Pt called and states that she needs a letter to send to Medicare stating that she is disabled and unable to work. Pt states that she will make an appt if additional information is needed. Pt can be reached at [phone].

## 2022-11-11 NOTE — Assessment & Plan Note (Signed)
Refill on retin-a
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
## 2022-11-15 ENCOUNTER — Telehealth: Payer: Self-pay | Admitting: Nurse Practitioner

## 2022-11-15 ENCOUNTER — Ambulatory Visit (HOSPITAL_BASED_OUTPATIENT_CLINIC_OR_DEPARTMENT_OTHER): Payer: Medicare Other | Admitting: Physical Therapy

## 2022-11-15 NOTE — Telephone Encounter (Signed)
Patient called and wanted an appointment with you sometime this week but you don't have any 30 minute slots and I know she has been in recently. She is saying that she doesn't think her adderrall is mixing well with another medication but she isn't sure which one she is assuming it is her pain medication. She says she can't sleep, having extreme heart burn, very anxious, can barely stand, and is in pain. I am not sure if you would like to fit her in this week or find a spot for her next week?

## 2022-11-16 NOTE — Telephone Encounter (Signed)
Please let Deborah [name] know that I have written the letter for disability and attached this to her MyChart. If she would like a printed copy, we can print this for her to pick up at her convenience.

## 2022-11-17 ENCOUNTER — Ambulatory Visit (HOSPITAL_BASED_OUTPATIENT_CLINIC_OR_DEPARTMENT_OTHER): Payer: Medicare Other | Admitting: Physical Therapy

## 2022-11-17 DIAGNOSIS — R293 Abnormal posture: Secondary | ICD-10-CM

## 2022-11-17 DIAGNOSIS — R2689 Other abnormalities of gait and mobility: Secondary | ICD-10-CM

## 2022-11-17 DIAGNOSIS — M5459 Other low back pain: Secondary | ICD-10-CM

## 2022-11-17 DIAGNOSIS — M546 Pain in thoracic spine: Secondary | ICD-10-CM
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
## 2022-11-18 ENCOUNTER — Encounter (HOSPITAL_BASED_OUTPATIENT_CLINIC_OR_DEPARTMENT_OTHER): Payer: Self-pay | Admitting: Physical Therapy

## 2022-11-22 ENCOUNTER — Ambulatory Visit (HOSPITAL_BASED_OUTPATIENT_CLINIC_OR_DEPARTMENT_OTHER): Payer: Medicare Other | Admitting: Physical Therapy

## 2022-11-22 DIAGNOSIS — R2689 Other abnormalities of gait and mobility: Secondary | ICD-10-CM

## 2022-11-22 DIAGNOSIS — R293 Abnormal posture: Secondary | ICD-10-CM

## 2022-11-22 DIAGNOSIS — M546 Pain in thoracic spine: Secondary | ICD-10-CM

## 2022-11-22 DIAGNOSIS — M5459 Other low back pain: Secondary | ICD-10-CM
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
## 2022-11-24 ENCOUNTER — Encounter (HOSPITAL_BASED_OUTPATIENT_CLINIC_OR_DEPARTMENT_OTHER): Payer: Self-pay | Admitting: Physical Therapy

## 2022-11-24 ENCOUNTER — Ambulatory Visit (HOSPITAL_BASED_OUTPATIENT_CLINIC_OR_DEPARTMENT_OTHER): Payer: Medicare Other | Admitting: Physical Therapy

## 2022-11-24 DIAGNOSIS — M545 Low back pain, unspecified: Secondary | ICD-10-CM

## 2022-11-24 DIAGNOSIS — M546 Pain in thoracic spine: Secondary | ICD-10-CM | POA: Diagnosis not present

## 2022-11-24 DIAGNOSIS — R2689 Other abnormalities of gait and mobility: Secondary | ICD-10-CM

## 2022-11-24 DIAGNOSIS — M5459 Other low back pain: Secondary | ICD-10-CM

## 2022-11-24 DIAGNOSIS — R293 Abnormal posture: Secondary | ICD-10-CM
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
## 2022-11-29 ENCOUNTER — Ambulatory Visit (HOSPITAL_BASED_OUTPATIENT_CLINIC_OR_DEPARTMENT_OTHER): Payer: Medicare Other | Admitting: Physical Therapy

## 2022-11-29 DIAGNOSIS — M545 Low back pain, unspecified: Secondary | ICD-10-CM

## 2022-11-29 DIAGNOSIS — M546 Pain in thoracic spine: Secondary | ICD-10-CM

## 2022-11-29 DIAGNOSIS — R2689 Other abnormalities of gait and mobility: Secondary | ICD-10-CM

## 2022-11-29 DIAGNOSIS — M5459 Other low back pain: Secondary | ICD-10-CM

## 2022-11-29 DIAGNOSIS — R293 Abnormal posture: Secondary | ICD-10-CM
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
## 2022-12-01 ENCOUNTER — Ambulatory Visit (HOSPITAL_BASED_OUTPATIENT_CLINIC_OR_DEPARTMENT_OTHER): Payer: Medicare Other | Admitting: Physical Therapy

## 2022-12-06 ENCOUNTER — Ambulatory Visit (HOSPITAL_BASED_OUTPATIENT_CLINIC_OR_DEPARTMENT_OTHER): Payer: Medicare Other | Admitting: Physical Therapy

## 2022-12-07 ENCOUNTER — Telehealth: Payer: Self-pay | Admitting: Nurse Practitioner

## 2022-12-07 NOTE — Telephone Encounter (Signed)
Pt requesting a refill on her QUEtiapine (SEROQUEL) 25 MG tablet sent to [id number] - [place],  - [address] AT [facility] OF [address] & [address]

## 2022-12-08 ENCOUNTER — Encounter (HOSPITAL_BASED_OUTPATIENT_CLINIC_OR_DEPARTMENT_OTHER): Payer: Self-pay | Admitting: Physical Therapy

## 2022-12-08 ENCOUNTER — Ambulatory Visit (HOSPITAL_BASED_OUTPATIENT_CLINIC_OR_DEPARTMENT_OTHER): Payer: Medicare Other | Attending: Physician Assistant | Admitting: Physical Therapy

## 2022-12-08 ENCOUNTER — Other Ambulatory Visit: Payer: Self-pay

## 2022-12-08 DIAGNOSIS — M546 Pain in thoracic spine: Secondary | ICD-10-CM | POA: Diagnosis not present

## 2022-12-08 DIAGNOSIS — R293 Abnormal posture: Secondary | ICD-10-CM | POA: Diagnosis not present

## 2022-12-08 DIAGNOSIS — R2689 Other abnormalities of gait and mobility: Secondary | ICD-10-CM | POA: Diagnosis not present

## 2022-12-08 DIAGNOSIS — M545 Low back pain, unspecified: Secondary | ICD-10-CM

## 2022-12-08 DIAGNOSIS — F5104 Psychophysiologic insomnia: Secondary | ICD-10-CM

## 2022-12-08 DIAGNOSIS — M5459 Other low back pain: Secondary | ICD-10-CM | POA: Diagnosis not present

## 2022-12-08 DIAGNOSIS — G8929 Other chronic pain: Secondary | ICD-10-CM | POA: Insufficient documentation

## 2022-12-08 DIAGNOSIS — F332 Major depressive disorder, recurrent severe without psychotic features: Secondary | ICD-10-CM

## 2022-12-08 MED ORDER — QUETIAPINE FUMARATE 25 MG PO TABS: 25.0000 mg | ORAL_TABLET | Freq: Every day | ORAL | 3 refills | Status: DC
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
## 2022-12-09 ENCOUNTER — Encounter (HOSPITAL_BASED_OUTPATIENT_CLINIC_OR_DEPARTMENT_OTHER): Payer: Self-pay | Admitting: Physical Therapy

## 2022-12-13 ENCOUNTER — Ambulatory Visit (HOSPITAL_BASED_OUTPATIENT_CLINIC_OR_DEPARTMENT_OTHER): Payer: Medicare Other | Admitting: Physical Therapy

## 2022-12-15 ENCOUNTER — Ambulatory Visit (HOSPITAL_BASED_OUTPATIENT_CLINIC_OR_DEPARTMENT_OTHER): Payer: Medicare Other | Admitting: Physical Therapy

## 2022-12-15 DIAGNOSIS — R293 Abnormal posture: Secondary | ICD-10-CM

## 2022-12-15 DIAGNOSIS — R2689 Other abnormalities of gait and mobility: Secondary | ICD-10-CM | POA: Diagnosis not present

## 2022-12-15 DIAGNOSIS — G8929 Other chronic pain: Secondary | ICD-10-CM | POA: Diagnosis not present

## 2022-12-15 DIAGNOSIS — M5459 Other low back pain: Secondary | ICD-10-CM | POA: Diagnosis not present

## 2022-12-15 DIAGNOSIS — M545 Low back pain, unspecified: Secondary | ICD-10-CM | POA: Diagnosis not present

## 2022-12-15 DIAGNOSIS — M546 Pain in thoracic spine: Secondary | ICD-10-CM | POA: Diagnosis not present
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
## 2022-12-20 ENCOUNTER — Encounter (HOSPITAL_BASED_OUTPATIENT_CLINIC_OR_DEPARTMENT_OTHER): Payer: Medicare Other | Admitting: Physical Therapy

## 2022-12-20 DIAGNOSIS — M47812 Spondylosis without myelopathy or radiculopathy, cervical region: Secondary | ICD-10-CM | POA: Diagnosis not present

## 2022-12-20 DIAGNOSIS — M47816 Spondylosis without myelopathy or radiculopathy, lumbar region: Secondary | ICD-10-CM | POA: Diagnosis not present

## 2022-12-20 DIAGNOSIS — M5412 Radiculopathy, cervical region: Secondary | ICD-10-CM | POA: Diagnosis not present

## 2022-12-20 DIAGNOSIS — M419 Scoliosis, unspecified: Secondary | ICD-10-CM | POA: Diagnosis not present

## 2022-12-20 DIAGNOSIS — M4807 Spinal stenosis, lumbosacral region: Secondary | ICD-10-CM | POA: Diagnosis not present

## 2022-12-20 DIAGNOSIS — M48061 Spinal stenosis, lumbar region without neurogenic claudication: Secondary | ICD-10-CM | POA: Diagnosis not present

## 2022-12-20 DIAGNOSIS — M5137 Other intervertebral disc degeneration, lumbosacral region: Secondary | ICD-10-CM | POA: Diagnosis not present

## 2022-12-21 DIAGNOSIS — Z5181 Encounter for therapeutic drug level monitoring: Secondary | ICD-10-CM | POA: Diagnosis not present

## 2022-12-21 DIAGNOSIS — G8929 Other chronic pain: Secondary | ICD-10-CM | POA: Diagnosis not present

## 2022-12-21 DIAGNOSIS — M545 Low back pain, unspecified: Secondary | ICD-10-CM | POA: Diagnosis not present

## 2022-12-21 DIAGNOSIS — G894 Chronic pain syndrome: Secondary | ICD-10-CM | POA: Diagnosis not present

## 2022-12-21 DIAGNOSIS — Z79891 Long term (current) use of opiate analgesic: Secondary | ICD-10-CM | POA: Diagnosis not present

## 2022-12-22 ENCOUNTER — Encounter (HOSPITAL_BASED_OUTPATIENT_CLINIC_OR_DEPARTMENT_OTHER): Payer: Medicare Other | Admitting: Physical Therapy

## 2022-12-27 ENCOUNTER — Encounter (HOSPITAL_BASED_OUTPATIENT_CLINIC_OR_DEPARTMENT_OTHER): Payer: Medicare Other | Admitting: Physical Therapy

## 2022-12-27 DIAGNOSIS — M4155 Other secondary scoliosis, thoracolumbar region: Secondary | ICD-10-CM | POA: Diagnosis not present

## 2022-12-27 DIAGNOSIS — M47816 Spondylosis without myelopathy or radiculopathy, lumbar region: Secondary | ICD-10-CM | POA: Diagnosis not present

## 2022-12-27 DIAGNOSIS — R7989 Other specified abnormal findings of blood chemistry: Secondary | ICD-10-CM | POA: Diagnosis not present

## 2022-12-27 DIAGNOSIS — M5135 Other intervertebral disc degeneration, thoracolumbar region: Secondary | ICD-10-CM | POA: Diagnosis not present

## 2022-12-27 DIAGNOSIS — M47814 Spondylosis without myelopathy or radiculopathy, thoracic region: Secondary | ICD-10-CM | POA: Diagnosis not present

## 2022-12-27 DIAGNOSIS — M47812 Spondylosis without myelopathy or radiculopathy, cervical region: Secondary | ICD-10-CM | POA: Diagnosis not present

## 2022-12-27 DIAGNOSIS — M4316 Spondylolisthesis, lumbar region: Secondary | ICD-10-CM | POA: Diagnosis not present

## 2022-12-28 ENCOUNTER — Ambulatory Visit (HOSPITAL_BASED_OUTPATIENT_CLINIC_OR_DEPARTMENT_OTHER): Payer: Medicare Other | Admitting: Physical Therapy

## 2022-12-29 ENCOUNTER — Encounter (HOSPITAL_BASED_OUTPATIENT_CLINIC_OR_DEPARTMENT_OTHER): Payer: Medicare Other | Admitting: Physical Therapy

## 2022-12-30 ENCOUNTER — Encounter (HOSPITAL_BASED_OUTPATIENT_CLINIC_OR_DEPARTMENT_OTHER): Payer: Medicare Other | Admitting: Physical Therapy

## 2023-01-02 ENCOUNTER — Telehealth: Payer: Self-pay | Admitting: Nurse Practitioner

## 2023-01-02 NOTE — Telephone Encounter (Signed)
[place] the social worker dropped off a form to be filled out, said they tried to fax it over but we must have never got it it keep saying busy, they would like it today if possible, she needs your signature  She can be reached at [phone] when ready

## 2023-01-03 ENCOUNTER — Encounter (HOSPITAL_BASED_OUTPATIENT_CLINIC_OR_DEPARTMENT_OTHER): Payer: Self-pay | Admitting: Physical Therapy

## 2023-01-03 ENCOUNTER — Ambulatory Visit (HOSPITAL_BASED_OUTPATIENT_CLINIC_OR_DEPARTMENT_OTHER): Payer: Medicare Other | Attending: Family | Admitting: Physical Therapy

## 2023-01-03 DIAGNOSIS — M546 Pain in thoracic spine: Secondary | ICD-10-CM | POA: Insufficient documentation

## 2023-01-03 DIAGNOSIS — R293 Abnormal posture: Secondary | ICD-10-CM | POA: Diagnosis not present

## 2023-01-03 DIAGNOSIS — M5459 Other low back pain: Secondary | ICD-10-CM | POA: Diagnosis not present

## 2023-01-03 DIAGNOSIS — R2689 Other abnormalities of gait and mobility: Secondary | ICD-10-CM | POA: Insufficient documentation

## 2023-01-03 NOTE — Telephone Encounter (Signed)
Pt. Called back just to let us know that her surgery has been scheduled on 03/20/23 and she will probably schedule a preop apt with [place] before that.

## 2023-01-04 ENCOUNTER — Encounter (HOSPITAL_BASED_OUTPATIENT_CLINIC_OR_DEPARTMENT_OTHER): Payer: Self-pay | Admitting: Physical Therapy
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
## 2023-01-05 ENCOUNTER — Ambulatory Visit (HOSPITAL_BASED_OUTPATIENT_CLINIC_OR_DEPARTMENT_OTHER): Payer: Medicare Other | Admitting: Physical Therapy

## 2023-01-05 DIAGNOSIS — M5459 Other low back pain: Secondary | ICD-10-CM | POA: Diagnosis not present

## 2023-01-05 DIAGNOSIS — R293 Abnormal posture: Secondary | ICD-10-CM

## 2023-01-05 DIAGNOSIS — R2689 Other abnormalities of gait and mobility: Secondary | ICD-10-CM

## 2023-01-05 DIAGNOSIS — M546 Pain in thoracic spine: Secondary | ICD-10-CM
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
## 2023-01-10 ENCOUNTER — Ambulatory Visit (HOSPITAL_BASED_OUTPATIENT_CLINIC_OR_DEPARTMENT_OTHER): Payer: Medicare Other | Admitting: Physical Therapy

## 2023-01-12 ENCOUNTER — Ambulatory Visit (HOSPITAL_BASED_OUTPATIENT_CLINIC_OR_DEPARTMENT_OTHER): Payer: Medicare Other | Admitting: Physical Therapy

## 2023-01-18 ENCOUNTER — Ambulatory Visit (HOSPITAL_BASED_OUTPATIENT_CLINIC_OR_DEPARTMENT_OTHER): Payer: Medicare Other | Admitting: Physical Therapy

## 2023-01-20 ENCOUNTER — Encounter (HOSPITAL_BASED_OUTPATIENT_CLINIC_OR_DEPARTMENT_OTHER): Payer: Medicare Other | Admitting: Physical Therapy

## 2023-01-24 ENCOUNTER — Ambulatory Visit (HOSPITAL_BASED_OUTPATIENT_CLINIC_OR_DEPARTMENT_OTHER): Payer: Medicare Other | Admitting: Physical Therapy

## 2023-01-24 DIAGNOSIS — M5459 Other low back pain: Secondary | ICD-10-CM

## 2023-01-24 DIAGNOSIS — R2689 Other abnormalities of gait and mobility: Secondary | ICD-10-CM | POA: Diagnosis not present

## 2023-01-24 DIAGNOSIS — M546 Pain in thoracic spine: Secondary | ICD-10-CM | POA: Diagnosis not present

## 2023-01-24 DIAGNOSIS — R293 Abnormal posture: Secondary | ICD-10-CM | POA: Diagnosis not present
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
## 2023-01-25 ENCOUNTER — Encounter (HOSPITAL_BASED_OUTPATIENT_CLINIC_OR_DEPARTMENT_OTHER): Payer: Self-pay | Admitting: Physical Therapy

## 2023-01-26 ENCOUNTER — Telehealth: Payer: Self-pay

## 2023-01-26 ENCOUNTER — Ambulatory Visit (HOSPITAL_BASED_OUTPATIENT_CLINIC_OR_DEPARTMENT_OTHER): Payer: Medicare Other | Admitting: Physical Therapy

## 2023-01-26 DIAGNOSIS — F900 Attention-deficit hyperactivity disorder, predominantly inattentive type: Secondary | ICD-10-CM

## 2023-01-26 NOTE — Telephone Encounter (Signed)
Pt. Called stating she just wanted to let you know her Adderall is due for refill next week on 01/31/23-02/01/23 and the holiday is coming up just wanted to make sure it got done.

## 2023-01-27 MED ORDER — AMPHETAMINE-DEXTROAMPHETAMINE 30 MG PO TABS
30.0000 mg | ORAL_TABLET | Freq: Two times a day (BID) | ORAL | 0 refills | Status: DC
Start: 2023-02-24 — End: 2023-02-24

## 2023-01-27 MED ORDER — AMPHETAMINE-DEXTROAMPHETAMINE 30 MG PO TABS
1.0000 | ORAL_TABLET | Freq: Two times a day (BID) | ORAL | 0 refills | Status: DC
Start: 2023-03-24 — End: 2023-04-25

## 2023-01-27 MED ORDER — AMPHETAMINE-DEXTROAMPHETAMINE 30 MG PO TABS: 30.0000 mg | ORAL_TABLET | Freq: Two times a day (BID) | ORAL | 0 refills | Status: DC

## 2023-02-07 ENCOUNTER — Ambulatory Visit (HOSPITAL_BASED_OUTPATIENT_CLINIC_OR_DEPARTMENT_OTHER): Payer: Medicare Other | Admitting: Physical Therapy

## 2023-02-09 ENCOUNTER — Telehealth (HOSPITAL_BASED_OUTPATIENT_CLINIC_OR_DEPARTMENT_OTHER): Payer: Self-pay | Admitting: Physical Therapy

## 2023-02-09 ENCOUNTER — Ambulatory Visit (HOSPITAL_BASED_OUTPATIENT_CLINIC_OR_DEPARTMENT_OTHER): Payer: Medicare Other | Attending: Family | Admitting: Physical Therapy

## 2023-02-09 DIAGNOSIS — M546 Pain in thoracic spine: Secondary | ICD-10-CM | POA: Insufficient documentation

## 2023-02-09 DIAGNOSIS — R2689 Other abnormalities of gait and mobility: Secondary | ICD-10-CM | POA: Insufficient documentation

## 2023-02-09 DIAGNOSIS — M5459 Other low back pain: Secondary | ICD-10-CM | POA: Insufficient documentation

## 2023-02-09 DIAGNOSIS — R293 Abnormal posture: Secondary | ICD-10-CM | POA: Insufficient documentation

## 2023-02-09 NOTE — Telephone Encounter (Signed)
Spoke with patient regarding no-show appointment. She claims she called but no message was in our system. She states at this point she can not ambulate. She reports significant hip pain. She reports it is from a fall. She states her Dr is off Friday. She was advised she can go to an urgent care or an ED. She was advised to see if one of her sisters could bring her. She was advised that we have an ortho urgent care and an ED at [facility]. She was advised to please call us if she can't attend.

## 2023-02-10 ENCOUNTER — Other Ambulatory Visit: Payer: Self-pay

## 2023-02-10 ENCOUNTER — Ambulatory Visit (INDEPENDENT_AMBULATORY_CARE_PROVIDER_SITE_OTHER): Payer: Medicare Other | Admitting: Family

## 2023-02-10 DIAGNOSIS — M25552 Pain in left hip: Secondary | ICD-10-CM

## 2023-02-10 DIAGNOSIS — M79671 Pain in right foot: Secondary | ICD-10-CM | POA: Diagnosis not present

## 2023-02-10 MED ORDER — HYDROCODONE-ACETAMINOPHEN 5-325 MG PO TABS: 1.0000 | ORAL_TABLET | Freq: Four times a day (QID) | ORAL | 0 refills | Status: DC | PRN
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
## 2023-02-13 ENCOUNTER — Encounter (HOSPITAL_BASED_OUTPATIENT_CLINIC_OR_DEPARTMENT_OTHER): Payer: Self-pay | Admitting: Physical Therapy

## 2023-02-13 ENCOUNTER — Ambulatory Visit (HOSPITAL_BASED_OUTPATIENT_CLINIC_OR_DEPARTMENT_OTHER): Payer: Medicare Other | Admitting: Physical Therapy

## 2023-02-13 ENCOUNTER — Ambulatory Visit: Payer: Medicare Other | Admitting: Family Medicine

## 2023-02-13 ENCOUNTER — Telehealth: Payer: Self-pay | Admitting: Family

## 2023-02-13 DIAGNOSIS — M5459 Other low back pain: Secondary | ICD-10-CM

## 2023-02-13 DIAGNOSIS — R2689 Other abnormalities of gait and mobility: Secondary | ICD-10-CM

## 2023-02-13 DIAGNOSIS — M546 Pain in thoracic spine: Secondary | ICD-10-CM | POA: Diagnosis not present

## 2023-02-13 DIAGNOSIS — R293 Abnormal posture: Secondary | ICD-10-CM
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
## 2023-02-13 NOTE — Telephone Encounter (Signed)
Please advise, you saw this patient on 7/12

## 2023-02-13 NOTE — Telephone Encounter (Signed)
Patient called advised she have to take 2 of the Hydrocodone because 1 does not work. Patient said she use to get 10 mg instead of 5 mg.  Patient asked what is she suppose to do for her leg? Patient said she is having spinal fusion surgery next month. - Pt. Number [phone]

## 2023-02-14 ENCOUNTER — Telehealth: Payer: Self-pay | Admitting: Family

## 2023-02-14 ENCOUNTER — Encounter (HOSPITAL_BASED_OUTPATIENT_CLINIC_OR_DEPARTMENT_OTHER): Payer: Self-pay | Admitting: Physical Therapy

## 2023-02-14 NOTE — Telephone Encounter (Signed)
Patient called would like a refill on hydrocodone called in for her pain. [phone]

## 2023-02-15 MED ORDER — HYDROCODONE-ACETAMINOPHEN 5-325 MG PO TABS: 1.0000 | ORAL_TABLET | Freq: Four times a day (QID) | ORAL | 0 refills | Status: DC | PRN

## 2023-02-15 NOTE — Telephone Encounter (Signed)
Pt informed

## 2023-02-15 NOTE — Telephone Encounter (Signed)
Have refilled it. For the hip pain, just have to give it time, can use hot compress. Use antiinflammatories oral or topical  Will be the last rf of norco tho

## 2023-02-16 ENCOUNTER — Encounter (HOSPITAL_BASED_OUTPATIENT_CLINIC_OR_DEPARTMENT_OTHER): Payer: Medicare Other | Admitting: Physical Therapy

## 2023-02-17 ENCOUNTER — Encounter: Payer: Self-pay | Admitting: Nurse Practitioner

## 2023-02-17 DIAGNOSIS — G8929 Other chronic pain: Secondary | ICD-10-CM | POA: Diagnosis not present

## 2023-02-17 DIAGNOSIS — Z79891 Long term (current) use of opiate analgesic: Secondary | ICD-10-CM | POA: Diagnosis not present

## 2023-02-17 DIAGNOSIS — T1490XS Injury, unspecified, sequela: Secondary | ICD-10-CM | POA: Diagnosis not present

## 2023-02-17 DIAGNOSIS — M545 Low back pain, unspecified: Secondary | ICD-10-CM | POA: Diagnosis not present

## 2023-02-17 DIAGNOSIS — G894 Chronic pain syndrome: Secondary | ICD-10-CM | POA: Diagnosis not present

## 2023-02-17 DIAGNOSIS — Z87891 Personal history of nicotine dependence: Secondary | ICD-10-CM | POA: Diagnosis not present

## 2023-02-17 DIAGNOSIS — M4150 Other secondary scoliosis, site unspecified: Secondary | ICD-10-CM | POA: Diagnosis not present

## 2023-02-20 ENCOUNTER — Encounter: Payer: Self-pay | Admitting: Family

## 2023-02-21 ENCOUNTER — Encounter (HOSPITAL_BASED_OUTPATIENT_CLINIC_OR_DEPARTMENT_OTHER): Payer: Medicare Other | Admitting: Physical Therapy

## 2023-02-23 ENCOUNTER — Encounter (HOSPITAL_BASED_OUTPATIENT_CLINIC_OR_DEPARTMENT_OTHER): Payer: Medicare Other | Admitting: Physical Therapy

## 2023-02-24 ENCOUNTER — Encounter: Payer: Self-pay | Admitting: Nurse Practitioner

## 2023-02-24 ENCOUNTER — Ambulatory Visit (INDEPENDENT_AMBULATORY_CARE_PROVIDER_SITE_OTHER): Payer: Medicare Other | Admitting: Nurse Practitioner

## 2023-02-24 VITALS — BP 128/80 | HR 64 | Wt 99.8 lb

## 2023-02-24 DIAGNOSIS — L739 Follicular disorder, unspecified: Secondary | ICD-10-CM | POA: Diagnosis not present

## 2023-02-24 DIAGNOSIS — F900 Attention-deficit hyperactivity disorder, predominantly inattentive type: Secondary | ICD-10-CM

## 2023-02-24 MED ORDER — DOXYCYCLINE HYCLATE 100 MG PO TABS: 100.0000 mg | ORAL_TABLET | Freq: Two times a day (BID) | ORAL | 0 refills | Status: DC

## 2023-02-24 MED ORDER — AMPHETAMINE-DEXTROAMPHETAMINE 30 MG PO TABS
30.0000 mg | ORAL_TABLET | Freq: Two times a day (BID) | ORAL | 0 refills | Status: DC
Start: 2023-04-23 — End: 2023-04-25

## 2023-02-24 MED ORDER — AMPHETAMINE-DEXTROAMPHETAMINE 30 MG PO TABS
30.0000 mg | ORAL_TABLET | Freq: Two times a day (BID) | ORAL | 0 refills | Status: DC
Start: 2023-05-21 — End: 2023-04-25

## 2023-02-24 MED ORDER — MUPIROCIN 2 % EX OINT
1.0000 | TOPICAL_OINTMENT | Freq: Two times a day (BID) | CUTANEOUS | 0 refills | Status: DC
Start: 2023-02-24 — End: 2023-10-19

## 2023-02-24 NOTE — Patient Instructions (Addendum)
You will do great with your surgery! I am glad you are finally able to get this done.    I have sent in the medication for your nose. I sent in both an ointment and pills to help.   I sent your ADHD medication in through October. We will keep this up for you while in rehab.
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
## 2023-02-28 ENCOUNTER — Ambulatory Visit (HOSPITAL_BASED_OUTPATIENT_CLINIC_OR_DEPARTMENT_OTHER): Payer: Medicare Other | Admitting: Physical Therapy

## 2023-02-28 ENCOUNTER — Encounter (HOSPITAL_BASED_OUTPATIENT_CLINIC_OR_DEPARTMENT_OTHER): Payer: Self-pay | Admitting: Physical Therapy

## 2023-02-28 DIAGNOSIS — M546 Pain in thoracic spine: Secondary | ICD-10-CM | POA: Diagnosis not present

## 2023-02-28 DIAGNOSIS — R2689 Other abnormalities of gait and mobility: Secondary | ICD-10-CM

## 2023-02-28 DIAGNOSIS — M5459 Other low back pain: Secondary | ICD-10-CM

## 2023-02-28 DIAGNOSIS — R293 Abnormal posture: Secondary | ICD-10-CM
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
## 2023-03-01 ENCOUNTER — Encounter (HOSPITAL_BASED_OUTPATIENT_CLINIC_OR_DEPARTMENT_OTHER): Payer: Self-pay | Admitting: Physical Therapy

## 2023-03-02 ENCOUNTER — Telehealth: Payer: Self-pay | Admitting: Nurse Practitioner

## 2023-03-02 ENCOUNTER — Encounter (HOSPITAL_BASED_OUTPATIENT_CLINIC_OR_DEPARTMENT_OTHER): Payer: Medicare Other | Admitting: Physical Therapy

## 2023-03-02 DIAGNOSIS — Z8639 Personal history of other endocrine, nutritional and metabolic disease: Secondary | ICD-10-CM | POA: Diagnosis not present

## 2023-03-02 DIAGNOSIS — R0602 Shortness of breath: Secondary | ICD-10-CM | POA: Diagnosis not present

## 2023-03-02 DIAGNOSIS — G8929 Other chronic pain: Secondary | ICD-10-CM | POA: Diagnosis not present

## 2023-03-02 DIAGNOSIS — M4325 Fusion of spine, thoracolumbar region: Secondary | ICD-10-CM | POA: Diagnosis not present

## 2023-03-02 DIAGNOSIS — K219 Gastro-esophageal reflux disease without esophagitis: Secondary | ICD-10-CM | POA: Diagnosis not present

## 2023-03-02 DIAGNOSIS — M438X9 Other specified deforming dorsopathies, site unspecified: Secondary | ICD-10-CM | POA: Diagnosis not present

## 2023-03-02 DIAGNOSIS — Z01818 Encounter for other preprocedural examination: Secondary | ICD-10-CM | POA: Diagnosis not present

## 2023-03-02 DIAGNOSIS — M4155 Other secondary scoliosis, thoracolumbar region: Secondary | ICD-10-CM | POA: Diagnosis not present

## 2023-03-02 DIAGNOSIS — R7989 Other specified abnormal findings of blood chemistry: Secondary | ICD-10-CM | POA: Diagnosis not present

## 2023-03-02 DIAGNOSIS — Z87891 Personal history of nicotine dependence: Secondary | ICD-10-CM | POA: Diagnosis not present

## 2023-03-02 DIAGNOSIS — M419 Scoliosis, unspecified: Secondary | ICD-10-CM | POA: Diagnosis not present

## 2023-03-02 DIAGNOSIS — M545 Low back pain, unspecified: Secondary | ICD-10-CM | POA: Diagnosis not present

## 2023-03-02 NOTE — Telephone Encounter (Signed)
Pt called states that nose infection is not any better, just started using cream 7/31  Please call   Needs refill on Vitamin D    [facility] [address]/

## 2023-03-07 ENCOUNTER — Encounter (HOSPITAL_BASED_OUTPATIENT_CLINIC_OR_DEPARTMENT_OTHER): Payer: Medicare Other | Admitting: Physical Therapy

## 2023-03-07 DIAGNOSIS — R7989 Other specified abnormal findings of blood chemistry: Secondary | ICD-10-CM | POA: Diagnosis not present

## 2023-03-07 DIAGNOSIS — I081 Rheumatic disorders of both mitral and tricuspid valves: Secondary | ICD-10-CM | POA: Diagnosis not present

## 2023-03-07 DIAGNOSIS — M4155 Other secondary scoliosis, thoracolumbar region: Secondary | ICD-10-CM | POA: Diagnosis not present

## 2023-03-07 DIAGNOSIS — I340 Nonrheumatic mitral (valve) insufficiency: Secondary | ICD-10-CM | POA: Diagnosis not present

## 2023-03-07 DIAGNOSIS — M419 Scoliosis, unspecified: Secondary | ICD-10-CM | POA: Diagnosis not present

## 2023-03-07 DIAGNOSIS — I071 Rheumatic tricuspid insufficiency: Secondary | ICD-10-CM | POA: Diagnosis not present

## 2023-03-07 DIAGNOSIS — I358 Other nonrheumatic aortic valve disorders: Secondary | ICD-10-CM | POA: Diagnosis not present

## 2023-03-07 NOTE — Telephone Encounter (Signed)
Please contact patient and see if she has been able to use the medication consistently in her nose and ask if there has been any changes. It can take up to 7 full days to notice improvement and 4 weeks for full resolution in some cases.   If she has no changes, I recommend we consider ENT to ensure we aren't missing something.

## 2023-03-09 ENCOUNTER — Ambulatory Visit (HOSPITAL_BASED_OUTPATIENT_CLINIC_OR_DEPARTMENT_OTHER): Payer: Medicare Other | Admitting: Physical Therapy

## 2023-03-13 NOTE — Telephone Encounter (Signed)
Patient requested pain medication until her surgery date. Unfortunately, I am unable to prescribe any additional pain medication as she is on Buprenorphine-Naloxone for chronic pain management. She will need to discuss this with her surgeon to see if they have the option to write in addition to the other medications.

## 2023-03-14 ENCOUNTER — Encounter (HOSPITAL_BASED_OUTPATIENT_CLINIC_OR_DEPARTMENT_OTHER): Payer: Medicare Other | Admitting: Physical Therapy

## 2023-03-20 ENCOUNTER — Telehealth: Payer: Self-pay

## 2023-03-20 DIAGNOSIS — R5381 Other malaise: Secondary | ICD-10-CM | POA: Diagnosis not present

## 2023-03-20 DIAGNOSIS — M419 Scoliosis, unspecified: Secondary | ICD-10-CM | POA: Diagnosis not present

## 2023-03-20 DIAGNOSIS — J9811 Atelectasis: Secondary | ICD-10-CM | POA: Diagnosis not present

## 2023-03-20 DIAGNOSIS — K5903 Drug induced constipation: Secondary | ICD-10-CM | POA: Diagnosis not present

## 2023-03-20 DIAGNOSIS — E222 Syndrome of inappropriate secretion of antidiuretic hormone: Secondary | ICD-10-CM | POA: Diagnosis not present

## 2023-03-20 DIAGNOSIS — E871 Hypo-osmolality and hyponatremia: Secondary | ICD-10-CM | POA: Diagnosis not present

## 2023-03-20 DIAGNOSIS — E440 Moderate protein-calorie malnutrition: Secondary | ICD-10-CM | POA: Diagnosis not present

## 2023-03-20 DIAGNOSIS — M4807 Spinal stenosis, lumbosacral region: Secondary | ICD-10-CM | POA: Diagnosis not present

## 2023-03-20 DIAGNOSIS — M4316 Spondylolisthesis, lumbar region: Secondary | ICD-10-CM | POA: Diagnosis not present

## 2023-03-20 DIAGNOSIS — R140 Abdominal distension (gaseous): Secondary | ICD-10-CM | POA: Diagnosis not present

## 2023-03-20 DIAGNOSIS — I10 Essential (primary) hypertension: Secondary | ICD-10-CM | POA: Diagnosis not present

## 2023-03-20 DIAGNOSIS — R9431 Abnormal electrocardiogram [ECG] [EKG]: Secondary | ICD-10-CM | POA: Diagnosis not present

## 2023-03-20 DIAGNOSIS — R278 Other lack of coordination: Secondary | ICD-10-CM | POA: Diagnosis not present

## 2023-03-20 DIAGNOSIS — K5939 Other megacolon: Secondary | ICD-10-CM | POA: Diagnosis not present

## 2023-03-20 DIAGNOSIS — R339 Retention of urine, unspecified: Secondary | ICD-10-CM | POA: Diagnosis not present

## 2023-03-20 DIAGNOSIS — G894 Chronic pain syndrome: Secondary | ICD-10-CM | POA: Diagnosis not present

## 2023-03-20 DIAGNOSIS — E039 Hypothyroidism, unspecified: Secondary | ICD-10-CM | POA: Diagnosis not present

## 2023-03-20 DIAGNOSIS — Z87891 Personal history of nicotine dependence: Secondary | ICD-10-CM | POA: Diagnosis not present

## 2023-03-20 DIAGNOSIS — T402X5A Adverse effect of other opioids, initial encounter: Secondary | ICD-10-CM | POA: Diagnosis not present

## 2023-03-20 DIAGNOSIS — E861 Hypovolemia: Secondary | ICD-10-CM | POA: Diagnosis not present

## 2023-03-20 DIAGNOSIS — G8918 Other acute postprocedural pain: Secondary | ICD-10-CM | POA: Diagnosis not present

## 2023-03-20 DIAGNOSIS — K567 Ileus, unspecified: Secondary | ICD-10-CM | POA: Diagnosis not present

## 2023-03-20 DIAGNOSIS — R079 Chest pain, unspecified: Secondary | ICD-10-CM | POA: Diagnosis not present

## 2023-03-20 DIAGNOSIS — Z981 Arthrodesis status: Secondary | ICD-10-CM | POA: Diagnosis not present

## 2023-03-20 DIAGNOSIS — Z452 Encounter for adjustment and management of vascular access device: Secondary | ICD-10-CM | POA: Diagnosis not present

## 2023-03-20 DIAGNOSIS — M4185 Other forms of scoliosis, thoracolumbar region: Secondary | ICD-10-CM | POA: Diagnosis not present

## 2023-03-20 DIAGNOSIS — K6389 Other specified diseases of intestine: Secondary | ICD-10-CM | POA: Diagnosis not present

## 2023-03-20 DIAGNOSIS — N390 Urinary tract infection, site not specified: Secondary | ICD-10-CM | POA: Diagnosis not present

## 2023-03-20 DIAGNOSIS — M1990 Unspecified osteoarthritis, unspecified site: Secondary | ICD-10-CM | POA: Diagnosis not present

## 2023-03-20 DIAGNOSIS — K219 Gastro-esophageal reflux disease without esophagitis: Secondary | ICD-10-CM | POA: Diagnosis not present

## 2023-03-20 DIAGNOSIS — J986 Disorders of diaphragm: Secondary | ICD-10-CM | POA: Diagnosis not present

## 2023-03-20 DIAGNOSIS — R262 Difficulty in walking, not elsewhere classified: Secondary | ICD-10-CM | POA: Diagnosis not present

## 2023-03-20 DIAGNOSIS — D62 Acute posthemorrhagic anemia: Secondary | ICD-10-CM | POA: Diagnosis not present

## 2023-03-20 DIAGNOSIS — Z87820 Personal history of traumatic brain injury: Secondary | ICD-10-CM | POA: Diagnosis not present

## 2023-03-20 DIAGNOSIS — G8929 Other chronic pain: Secondary | ICD-10-CM | POA: Diagnosis not present

## 2023-03-20 DIAGNOSIS — M438X5 Other specified deforming dorsopathies, thoracolumbar region: Secondary | ICD-10-CM | POA: Diagnosis not present

## 2023-03-20 DIAGNOSIS — Z79899 Other long term (current) drug therapy: Secondary | ICD-10-CM | POA: Diagnosis not present

## 2023-03-20 DIAGNOSIS — M4306 Spondylolysis, lumbar region: Secondary | ICD-10-CM | POA: Diagnosis not present

## 2023-03-20 DIAGNOSIS — R279 Unspecified lack of coordination: Secondary | ICD-10-CM | POA: Diagnosis not present

## 2023-03-20 DIAGNOSIS — D689 Coagulation defect, unspecified: Secondary | ICD-10-CM | POA: Diagnosis not present

## 2023-03-20 DIAGNOSIS — R52 Pain, unspecified: Secondary | ICD-10-CM | POA: Diagnosis not present

## 2023-03-20 DIAGNOSIS — M6281 Muscle weakness (generalized): Secondary | ICD-10-CM | POA: Diagnosis not present

## 2023-03-20 DIAGNOSIS — I471 Supraventricular tachycardia, unspecified: Secondary | ICD-10-CM | POA: Diagnosis not present

## 2023-03-20 DIAGNOSIS — M438X9 Other specified deforming dorsopathies, site unspecified: Secondary | ICD-10-CM | POA: Diagnosis not present

## 2023-03-20 DIAGNOSIS — K9189 Other postprocedural complications and disorders of digestive system: Secondary | ICD-10-CM | POA: Diagnosis not present

## 2023-03-20 DIAGNOSIS — E43 Unspecified severe protein-calorie malnutrition: Secondary | ICD-10-CM | POA: Diagnosis not present

## 2023-03-20 DIAGNOSIS — M4155 Other secondary scoliosis, thoracolumbar region: Secondary | ICD-10-CM | POA: Diagnosis not present

## 2023-03-20 NOTE — Telephone Encounter (Signed)
Rx refill for Vitamin D2 50,000 Cap

## 2023-03-21 ENCOUNTER — Other Ambulatory Visit: Payer: Self-pay

## 2023-03-21 DIAGNOSIS — M4185 Other forms of scoliosis, thoracolumbar region: Secondary | ICD-10-CM | POA: Diagnosis not present

## 2023-03-21 DIAGNOSIS — G8918 Other acute postprocedural pain: Secondary | ICD-10-CM | POA: Diagnosis not present

## 2023-03-21 DIAGNOSIS — E559 Vitamin D deficiency, unspecified: Secondary | ICD-10-CM

## 2023-03-21 DIAGNOSIS — E039 Hypothyroidism, unspecified: Secondary | ICD-10-CM | POA: Diagnosis not present

## 2023-03-21 DIAGNOSIS — I10 Essential (primary) hypertension: Secondary | ICD-10-CM | POA: Diagnosis not present

## 2023-03-21 DIAGNOSIS — Z87891 Personal history of nicotine dependence: Secondary | ICD-10-CM | POA: Diagnosis not present

## 2023-03-21 DIAGNOSIS — K219 Gastro-esophageal reflux disease without esophagitis: Secondary | ICD-10-CM | POA: Diagnosis not present

## 2023-03-21 MED ORDER — VITAMIN D (ERGOCALCIFEROL) 1.25 MG (50000 UNIT) PO CAPS: 50000.0000 [IU] | ORAL_CAPSULE | ORAL | 0 refills | Status: DC

## 2023-03-22 DIAGNOSIS — I10 Essential (primary) hypertension: Secondary | ICD-10-CM | POA: Diagnosis not present

## 2023-03-22 DIAGNOSIS — G8918 Other acute postprocedural pain: Secondary | ICD-10-CM | POA: Diagnosis not present

## 2023-03-22 DIAGNOSIS — Z87891 Personal history of nicotine dependence: Secondary | ICD-10-CM | POA: Diagnosis not present

## 2023-03-22 DIAGNOSIS — K219 Gastro-esophageal reflux disease without esophagitis: Secondary | ICD-10-CM | POA: Diagnosis not present

## 2023-03-22 DIAGNOSIS — M4185 Other forms of scoliosis, thoracolumbar region: Secondary | ICD-10-CM | POA: Diagnosis not present

## 2023-03-22 DIAGNOSIS — E039 Hypothyroidism, unspecified: Secondary | ICD-10-CM | POA: Diagnosis not present

## 2023-03-23 DIAGNOSIS — K567 Ileus, unspecified: Secondary | ICD-10-CM | POA: Diagnosis not present

## 2023-03-23 DIAGNOSIS — I10 Essential (primary) hypertension: Secondary | ICD-10-CM | POA: Diagnosis not present

## 2023-03-23 DIAGNOSIS — M4185 Other forms of scoliosis, thoracolumbar region: Secondary | ICD-10-CM | POA: Diagnosis not present

## 2023-03-23 DIAGNOSIS — G8918 Other acute postprocedural pain: Secondary | ICD-10-CM | POA: Diagnosis not present

## 2023-03-23 DIAGNOSIS — I471 Supraventricular tachycardia, unspecified: Secondary | ICD-10-CM | POA: Diagnosis not present

## 2023-03-23 DIAGNOSIS — Z87891 Personal history of nicotine dependence: Secondary | ICD-10-CM | POA: Diagnosis not present

## 2023-03-23 DIAGNOSIS — K219 Gastro-esophageal reflux disease without esophagitis: Secondary | ICD-10-CM | POA: Diagnosis not present

## 2023-03-23 DIAGNOSIS — E039 Hypothyroidism, unspecified: Secondary | ICD-10-CM | POA: Diagnosis not present

## 2023-03-23 DIAGNOSIS — K5939 Other megacolon: Secondary | ICD-10-CM | POA: Diagnosis not present

## 2023-03-24 DIAGNOSIS — R140 Abdominal distension (gaseous): Secondary | ICD-10-CM | POA: Diagnosis not present

## 2023-03-24 DIAGNOSIS — I10 Essential (primary) hypertension: Secondary | ICD-10-CM | POA: Diagnosis not present

## 2023-03-24 DIAGNOSIS — K567 Ileus, unspecified: Secondary | ICD-10-CM | POA: Diagnosis not present

## 2023-03-24 DIAGNOSIS — M4185 Other forms of scoliosis, thoracolumbar region: Secondary | ICD-10-CM | POA: Diagnosis not present

## 2023-03-24 DIAGNOSIS — K9189 Other postprocedural complications and disorders of digestive system: Secondary | ICD-10-CM | POA: Diagnosis not present

## 2023-03-24 DIAGNOSIS — M4155 Other secondary scoliosis, thoracolumbar region: Secondary | ICD-10-CM | POA: Diagnosis not present

## 2023-03-24 DIAGNOSIS — G8918 Other acute postprocedural pain: Secondary | ICD-10-CM | POA: Diagnosis not present

## 2023-03-24 DIAGNOSIS — I471 Supraventricular tachycardia, unspecified: Secondary | ICD-10-CM | POA: Diagnosis not present

## 2023-03-24 DIAGNOSIS — K6389 Other specified diseases of intestine: Secondary | ICD-10-CM | POA: Diagnosis not present

## 2023-03-24 DIAGNOSIS — K5939 Other megacolon: Secondary | ICD-10-CM | POA: Diagnosis not present

## 2023-03-24 DIAGNOSIS — K219 Gastro-esophageal reflux disease without esophagitis: Secondary | ICD-10-CM | POA: Diagnosis not present

## 2023-03-25 DIAGNOSIS — R140 Abdominal distension (gaseous): Secondary | ICD-10-CM | POA: Diagnosis not present

## 2023-03-25 DIAGNOSIS — M4155 Other secondary scoliosis, thoracolumbar region: Secondary | ICD-10-CM | POA: Diagnosis not present

## 2023-03-25 DIAGNOSIS — K9189 Other postprocedural complications and disorders of digestive system: Secondary | ICD-10-CM | POA: Diagnosis not present

## 2023-03-25 DIAGNOSIS — K567 Ileus, unspecified: Secondary | ICD-10-CM | POA: Diagnosis not present

## 2023-03-25 DIAGNOSIS — G8918 Other acute postprocedural pain: Secondary | ICD-10-CM | POA: Diagnosis not present

## 2023-03-25 DIAGNOSIS — Z981 Arthrodesis status: Secondary | ICD-10-CM | POA: Diagnosis not present

## 2023-03-25 DIAGNOSIS — K6389 Other specified diseases of intestine: Secondary | ICD-10-CM | POA: Diagnosis not present

## 2023-03-26 DIAGNOSIS — K567 Ileus, unspecified: Secondary | ICD-10-CM | POA: Diagnosis not present

## 2023-03-26 DIAGNOSIS — K219 Gastro-esophageal reflux disease without esophagitis: Secondary | ICD-10-CM | POA: Diagnosis not present

## 2023-03-26 DIAGNOSIS — Z87891 Personal history of nicotine dependence: Secondary | ICD-10-CM | POA: Diagnosis not present

## 2023-03-26 DIAGNOSIS — M4185 Other forms of scoliosis, thoracolumbar region: Secondary | ICD-10-CM | POA: Diagnosis not present

## 2023-03-26 DIAGNOSIS — G8918 Other acute postprocedural pain: Secondary | ICD-10-CM | POA: Diagnosis not present

## 2023-03-26 DIAGNOSIS — E039 Hypothyroidism, unspecified: Secondary | ICD-10-CM | POA: Diagnosis not present

## 2023-03-26 DIAGNOSIS — K6389 Other specified diseases of intestine: Secondary | ICD-10-CM | POA: Diagnosis not present

## 2023-03-26 DIAGNOSIS — I471 Supraventricular tachycardia, unspecified: Secondary | ICD-10-CM | POA: Diagnosis not present

## 2023-03-26 DIAGNOSIS — E43 Unspecified severe protein-calorie malnutrition: Secondary | ICD-10-CM | POA: Diagnosis not present

## 2023-03-26 DIAGNOSIS — I10 Essential (primary) hypertension: Secondary | ICD-10-CM | POA: Diagnosis not present

## 2023-03-27 DIAGNOSIS — G8929 Other chronic pain: Secondary | ICD-10-CM | POA: Diagnosis not present

## 2023-03-27 DIAGNOSIS — K567 Ileus, unspecified: Secondary | ICD-10-CM | POA: Diagnosis not present

## 2023-03-27 DIAGNOSIS — G8918 Other acute postprocedural pain: Secondary | ICD-10-CM | POA: Diagnosis not present

## 2023-03-28 DIAGNOSIS — G8918 Other acute postprocedural pain: Secondary | ICD-10-CM | POA: Diagnosis not present

## 2023-03-29 DIAGNOSIS — G8918 Other acute postprocedural pain: Secondary | ICD-10-CM | POA: Diagnosis not present

## 2023-03-29 DIAGNOSIS — R9431 Abnormal electrocardiogram [ECG] [EKG]: Secondary | ICD-10-CM | POA: Diagnosis not present

## 2023-03-29 DIAGNOSIS — R079 Chest pain, unspecified: Secondary | ICD-10-CM | POA: Diagnosis not present

## 2023-03-29 DIAGNOSIS — K5939 Other megacolon: Secondary | ICD-10-CM | POA: Diagnosis not present

## 2023-03-29 DIAGNOSIS — J9811 Atelectasis: Secondary | ICD-10-CM | POA: Diagnosis not present

## 2023-03-31 DIAGNOSIS — G894 Chronic pain syndrome: Secondary | ICD-10-CM | POA: Diagnosis not present

## 2023-03-31 DIAGNOSIS — G8918 Other acute postprocedural pain: Secondary | ICD-10-CM | POA: Diagnosis not present

## 2023-04-01 DIAGNOSIS — G8918 Other acute postprocedural pain: Secondary | ICD-10-CM | POA: Diagnosis not present

## 2023-04-01 DIAGNOSIS — G894 Chronic pain syndrome: Secondary | ICD-10-CM | POA: Diagnosis not present

## 2023-04-02 DIAGNOSIS — G8918 Other acute postprocedural pain: Secondary | ICD-10-CM | POA: Diagnosis not present

## 2023-04-03 DIAGNOSIS — G894 Chronic pain syndrome: Secondary | ICD-10-CM | POA: Diagnosis not present

## 2023-04-04 DIAGNOSIS — E039 Hypothyroidism, unspecified: Secondary | ICD-10-CM | POA: Diagnosis not present

## 2023-04-04 DIAGNOSIS — Z9889 Other specified postprocedural states: Secondary | ICD-10-CM | POA: Diagnosis not present

## 2023-04-04 DIAGNOSIS — R279 Unspecified lack of coordination: Secondary | ICD-10-CM | POA: Diagnosis not present

## 2023-04-04 DIAGNOSIS — L98429 Non-pressure chronic ulcer of back with unspecified severity: Secondary | ICD-10-CM | POA: Diagnosis not present

## 2023-04-04 DIAGNOSIS — R110 Nausea: Secondary | ICD-10-CM | POA: Diagnosis not present

## 2023-04-04 DIAGNOSIS — E46 Unspecified protein-calorie malnutrition: Secondary | ICD-10-CM | POA: Diagnosis not present

## 2023-04-04 DIAGNOSIS — M4155 Other secondary scoliosis, thoracolumbar region: Secondary | ICD-10-CM | POA: Diagnosis not present

## 2023-04-04 DIAGNOSIS — Z48811 Encounter for surgical aftercare following surgery on the nervous system: Secondary | ICD-10-CM | POA: Diagnosis not present

## 2023-04-04 DIAGNOSIS — R531 Weakness: Secondary | ICD-10-CM | POA: Diagnosis not present

## 2023-04-04 DIAGNOSIS — R262 Difficulty in walking, not elsewhere classified: Secondary | ICD-10-CM | POA: Diagnosis not present

## 2023-04-04 DIAGNOSIS — R278 Other lack of coordination: Secondary | ICD-10-CM | POA: Diagnosis not present

## 2023-04-04 DIAGNOSIS — M419 Scoliosis, unspecified: Secondary | ICD-10-CM | POA: Diagnosis not present

## 2023-04-04 DIAGNOSIS — R52 Pain, unspecified: Secondary | ICD-10-CM | POA: Diagnosis not present

## 2023-04-04 DIAGNOSIS — K5900 Constipation, unspecified: Secondary | ICD-10-CM | POA: Diagnosis not present

## 2023-04-04 DIAGNOSIS — M4306 Spondylolysis, lumbar region: Secondary | ICD-10-CM | POA: Diagnosis not present

## 2023-04-04 DIAGNOSIS — G8918 Other acute postprocedural pain: Secondary | ICD-10-CM | POA: Diagnosis not present

## 2023-04-04 DIAGNOSIS — M6281 Muscle weakness (generalized): Secondary | ICD-10-CM | POA: Diagnosis not present

## 2023-04-04 DIAGNOSIS — Z743 Need for continuous supervision: Secondary | ICD-10-CM | POA: Diagnosis not present

## 2023-04-04 DIAGNOSIS — R252 Cramp and spasm: Secondary | ICD-10-CM | POA: Diagnosis not present

## 2023-04-04 DIAGNOSIS — R339 Retention of urine, unspecified: Secondary | ICD-10-CM | POA: Diagnosis not present

## 2023-04-04 DIAGNOSIS — G894 Chronic pain syndrome: Secondary | ICD-10-CM | POA: Diagnosis not present

## 2023-04-04 DIAGNOSIS — Z981 Arthrodesis status: Secondary | ICD-10-CM | POA: Diagnosis not present

## 2023-04-04 DIAGNOSIS — Z4789 Encounter for other orthopedic aftercare: Secondary | ICD-10-CM | POA: Diagnosis not present

## 2023-04-04 DIAGNOSIS — M4325 Fusion of spine, thoracolumbar region: Secondary | ICD-10-CM | POA: Diagnosis not present

## 2023-04-04 DIAGNOSIS — K219 Gastro-esophageal reflux disease without esophagitis: Secondary | ICD-10-CM | POA: Diagnosis not present

## 2023-04-04 DIAGNOSIS — D649 Anemia, unspecified: Secondary | ICD-10-CM | POA: Diagnosis not present

## 2023-04-04 DIAGNOSIS — R5381 Other malaise: Secondary | ICD-10-CM | POA: Diagnosis not present

## 2023-04-05 DIAGNOSIS — R110 Nausea: Secondary | ICD-10-CM | POA: Diagnosis not present

## 2023-04-05 DIAGNOSIS — R339 Retention of urine, unspecified: Secondary | ICD-10-CM | POA: Diagnosis not present

## 2023-04-05 DIAGNOSIS — K219 Gastro-esophageal reflux disease without esophagitis: Secondary | ICD-10-CM | POA: Diagnosis not present

## 2023-04-05 DIAGNOSIS — K5900 Constipation, unspecified: Secondary | ICD-10-CM | POA: Diagnosis not present

## 2023-04-05 DIAGNOSIS — G894 Chronic pain syndrome: Secondary | ICD-10-CM | POA: Diagnosis not present

## 2023-04-05 DIAGNOSIS — M419 Scoliosis, unspecified: Secondary | ICD-10-CM | POA: Diagnosis not present

## 2023-04-05 DIAGNOSIS — E039 Hypothyroidism, unspecified: Secondary | ICD-10-CM | POA: Diagnosis not present

## 2023-04-05 DIAGNOSIS — R252 Cramp and spasm: Secondary | ICD-10-CM | POA: Diagnosis not present

## 2023-04-07 DIAGNOSIS — R531 Weakness: Secondary | ICD-10-CM | POA: Diagnosis not present

## 2023-04-07 DIAGNOSIS — G8918 Other acute postprocedural pain: Secondary | ICD-10-CM | POA: Diagnosis not present

## 2023-04-07 DIAGNOSIS — Z743 Need for continuous supervision: Secondary | ICD-10-CM | POA: Diagnosis not present

## 2023-04-07 DIAGNOSIS — Z48811 Encounter for surgical aftercare following surgery on the nervous system: Secondary | ICD-10-CM | POA: Diagnosis not present

## 2023-04-07 DIAGNOSIS — Z981 Arthrodesis status: Secondary | ICD-10-CM | POA: Diagnosis not present

## 2023-04-11 DIAGNOSIS — G8918 Other acute postprocedural pain: Secondary | ICD-10-CM | POA: Diagnosis not present

## 2023-04-13 DIAGNOSIS — L98429 Non-pressure chronic ulcer of back with unspecified severity: Secondary | ICD-10-CM | POA: Diagnosis not present

## 2023-04-13 DIAGNOSIS — D649 Anemia, unspecified: Secondary | ICD-10-CM | POA: Diagnosis not present

## 2023-04-14 DIAGNOSIS — E039 Hypothyroidism, unspecified: Secondary | ICD-10-CM | POA: Diagnosis not present

## 2023-04-14 DIAGNOSIS — M4325 Fusion of spine, thoracolumbar region: Secondary | ICD-10-CM | POA: Diagnosis not present

## 2023-04-14 DIAGNOSIS — Z9889 Other specified postprocedural states: Secondary | ICD-10-CM | POA: Diagnosis not present

## 2023-04-14 DIAGNOSIS — E46 Unspecified protein-calorie malnutrition: Secondary | ICD-10-CM | POA: Diagnosis not present

## 2023-04-14 DIAGNOSIS — M419 Scoliosis, unspecified: Secondary | ICD-10-CM | POA: Diagnosis not present

## 2023-04-14 DIAGNOSIS — R339 Retention of urine, unspecified: Secondary | ICD-10-CM | POA: Diagnosis not present

## 2023-04-19 DIAGNOSIS — Z4789 Encounter for other orthopedic aftercare: Secondary | ICD-10-CM | POA: Diagnosis not present

## 2023-04-19 DIAGNOSIS — Z981 Arthrodesis status: Secondary | ICD-10-CM | POA: Diagnosis not present

## 2023-04-20 DIAGNOSIS — R252 Cramp and spasm: Secondary | ICD-10-CM | POA: Diagnosis not present

## 2023-04-20 DIAGNOSIS — D649 Anemia, unspecified: Secondary | ICD-10-CM | POA: Diagnosis not present

## 2023-04-20 DIAGNOSIS — L98429 Non-pressure chronic ulcer of back with unspecified severity: Secondary | ICD-10-CM | POA: Diagnosis not present

## 2023-04-20 DIAGNOSIS — G894 Chronic pain syndrome: Secondary | ICD-10-CM | POA: Diagnosis not present

## 2023-04-25 ENCOUNTER — Telehealth: Payer: Self-pay | Admitting: Nurse Practitioner

## 2023-04-25 DIAGNOSIS — G8918 Other acute postprocedural pain: Secondary | ICD-10-CM | POA: Diagnosis not present

## 2023-04-25 DIAGNOSIS — F900 Attention-deficit hyperactivity disorder, predominantly inattentive type: Secondary | ICD-10-CM

## 2023-04-25 MED ORDER — AMPHETAMINE-DEXTROAMPHETAMINE 30 MG PO TABS
1.0000 | ORAL_TABLET | Freq: Two times a day (BID) | ORAL | 0 refills | Status: DC
Start: 2023-04-25 — End: 2023-06-16

## 2023-04-25 MED ORDER — AMPHETAMINE-DEXTROAMPHETAMINE 30 MG PO TABS: 30.0000 mg | ORAL_TABLET | Freq: Two times a day (BID) | ORAL | 0 refills | Status: DC

## 2023-04-25 MED ORDER — AMPHETAMINE-DEXTROAMPHETAMINE 30 MG PO TABS
30.0000 mg | ORAL_TABLET | Freq: Two times a day (BID) | ORAL | 0 refills | Status: DC
Start: 2023-06-20 — End: 2023-09-14

## 2023-04-25 NOTE — Telephone Encounter (Signed)
Pt called requesting refill on adderall to [id number] - [place], [place] - [address] AT [facility] OF [address]   She also left a vm on Adam's phone and would like a call back to confirm Madelaine Bhat got the vm please.

## 2023-05-04 DIAGNOSIS — Z87891 Personal history of nicotine dependence: Secondary | ICD-10-CM | POA: Diagnosis not present

## 2023-05-04 DIAGNOSIS — M170 Bilateral primary osteoarthritis of knee: Secondary | ICD-10-CM | POA: Diagnosis not present

## 2023-05-04 DIAGNOSIS — M47812 Spondylosis without myelopathy or radiculopathy, cervical region: Secondary | ICD-10-CM | POA: Diagnosis not present

## 2023-05-04 DIAGNOSIS — M47814 Spondylosis without myelopathy or radiculopathy, thoracic region: Secondary | ICD-10-CM | POA: Diagnosis not present

## 2023-05-04 DIAGNOSIS — M4312 Spondylolisthesis, cervical region: Secondary | ICD-10-CM | POA: Diagnosis not present

## 2023-05-04 DIAGNOSIS — M4325 Fusion of spine, thoracolumbar region: Secondary | ICD-10-CM | POA: Diagnosis not present

## 2023-05-04 DIAGNOSIS — M419 Scoliosis, unspecified: Secondary | ICD-10-CM | POA: Diagnosis not present

## 2023-05-04 DIAGNOSIS — Z981 Arthrodesis status: Secondary | ICD-10-CM | POA: Diagnosis not present

## 2023-05-04 DIAGNOSIS — M47816 Spondylosis without myelopathy or radiculopathy, lumbar region: Secondary | ICD-10-CM | POA: Diagnosis not present

## 2023-05-05 ENCOUNTER — Other Ambulatory Visit: Payer: Self-pay | Admitting: Nurse Practitioner

## 2023-05-05 DIAGNOSIS — Z1211 Encounter for screening for malignant neoplasm of colon: Secondary | ICD-10-CM

## 2023-05-05 DIAGNOSIS — Z1212 Encounter for screening for malignant neoplasm of rectum: Secondary | ICD-10-CM

## 2023-05-09 DIAGNOSIS — G8929 Other chronic pain: Secondary | ICD-10-CM | POA: Diagnosis not present

## 2023-05-09 DIAGNOSIS — G8918 Other acute postprocedural pain: Secondary | ICD-10-CM | POA: Diagnosis not present

## 2023-05-09 DIAGNOSIS — Z79899 Other long term (current) drug therapy: Secondary | ICD-10-CM | POA: Diagnosis not present

## 2023-05-09 DIAGNOSIS — G894 Chronic pain syndrome: Secondary | ICD-10-CM | POA: Diagnosis not present

## 2023-05-09 DIAGNOSIS — M545 Low back pain, unspecified: Secondary | ICD-10-CM | POA: Diagnosis not present

## 2023-05-15 ENCOUNTER — Other Ambulatory Visit: Payer: Self-pay

## 2023-05-15 ENCOUNTER — Telehealth: Payer: Self-pay

## 2023-05-15 MED ORDER — BUPROPION HCL ER (XL) 300 MG PO TB24: 300.0000 mg | ORAL_TABLET | Freq: Every day | ORAL | 0 refills | Status: DC

## 2023-05-15 NOTE — Telephone Encounter (Signed)
Faxed request for buproprion

## 2023-05-23 DIAGNOSIS — G8918 Other acute postprocedural pain: Secondary | ICD-10-CM | POA: Diagnosis not present

## 2023-05-23 DIAGNOSIS — G894 Chronic pain syndrome: Secondary | ICD-10-CM | POA: Diagnosis not present

## 2023-06-05 DIAGNOSIS — G8918 Other acute postprocedural pain: Secondary | ICD-10-CM | POA: Diagnosis not present

## 2023-06-05 DIAGNOSIS — G894 Chronic pain syndrome: Secondary | ICD-10-CM | POA: Diagnosis not present

## 2023-06-06 ENCOUNTER — Ambulatory Visit: Payer: Medicare Other | Admitting: Nurse Practitioner

## 2023-06-12 ENCOUNTER — Ambulatory Visit (HOSPITAL_BASED_OUTPATIENT_CLINIC_OR_DEPARTMENT_OTHER): Payer: Medicare Other | Attending: Physician Assistant | Admitting: Physical Therapy

## 2023-06-12 ENCOUNTER — Encounter (HOSPITAL_BASED_OUTPATIENT_CLINIC_OR_DEPARTMENT_OTHER): Payer: Self-pay | Admitting: Physical Therapy

## 2023-06-12 DIAGNOSIS — M5459 Other low back pain: Secondary | ICD-10-CM | POA: Diagnosis not present

## 2023-06-12 DIAGNOSIS — Z981 Arthrodesis status: Secondary | ICD-10-CM | POA: Insufficient documentation

## 2023-06-12 DIAGNOSIS — Z4789 Encounter for other orthopedic aftercare: Secondary | ICD-10-CM | POA: Insufficient documentation

## 2023-06-12 DIAGNOSIS — R2689 Other abnormalities of gait and mobility: Secondary | ICD-10-CM | POA: Diagnosis not present

## 2023-06-12 DIAGNOSIS — R293 Abnormal posture: Secondary | ICD-10-CM | POA: Insufficient documentation

## 2023-06-12 DIAGNOSIS — M546 Pain in thoracic spine: Secondary | ICD-10-CM

## 2023-06-12 DIAGNOSIS — M4325 Fusion of spine, thoracolumbar region: Secondary | ICD-10-CM | POA: Diagnosis present
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
## 2023-06-13 ENCOUNTER — Encounter (HOSPITAL_BASED_OUTPATIENT_CLINIC_OR_DEPARTMENT_OTHER): Payer: Self-pay | Admitting: Physical Therapy

## 2023-06-16 ENCOUNTER — Encounter: Payer: Self-pay | Admitting: Nurse Practitioner

## 2023-06-16 ENCOUNTER — Ambulatory Visit (INDEPENDENT_AMBULATORY_CARE_PROVIDER_SITE_OTHER): Payer: Medicare Other | Admitting: Nurse Practitioner

## 2023-06-16 VITALS — BP 130/82 | HR 64 | Wt 108.6 lb

## 2023-06-16 DIAGNOSIS — E559 Vitamin D deficiency, unspecified: Secondary | ICD-10-CM

## 2023-06-16 DIAGNOSIS — F332 Major depressive disorder, recurrent severe without psychotic features: Secondary | ICD-10-CM

## 2023-06-16 DIAGNOSIS — L719 Rosacea, unspecified: Secondary | ICD-10-CM

## 2023-06-16 DIAGNOSIS — K5903 Drug induced constipation: Secondary | ICD-10-CM

## 2023-06-16 DIAGNOSIS — Z8781 Personal history of (healed) traumatic fracture: Secondary | ICD-10-CM | POA: Diagnosis not present

## 2023-06-16 DIAGNOSIS — G894 Chronic pain syndrome: Secondary | ICD-10-CM

## 2023-06-16 DIAGNOSIS — T402X5A Adverse effect of other opioids, initial encounter: Secondary | ICD-10-CM

## 2023-06-16 DIAGNOSIS — F900 Attention-deficit hyperactivity disorder, predominantly inattentive type: Secondary | ICD-10-CM | POA: Diagnosis not present

## 2023-06-16 DIAGNOSIS — F5104 Psychophysiologic insomnia: Secondary | ICD-10-CM

## 2023-06-16 DIAGNOSIS — Z9889 Other specified postprocedural states: Secondary | ICD-10-CM | POA: Diagnosis not present

## 2023-06-16 DIAGNOSIS — K219 Gastro-esophageal reflux disease without esophagitis: Secondary | ICD-10-CM | POA: Diagnosis not present

## 2023-06-16 MED ORDER — BIMATOPROST 0.03 % EX SOLN: CUTANEOUS | 12 refills | Status: AC

## 2023-06-16 MED ORDER — VITAMIN D (ERGOCALCIFEROL) 1.25 MG (50000 UNIT) PO CAPS: 50000.0000 [IU] | ORAL_CAPSULE | ORAL | 3 refills | Status: DC

## 2023-06-16 MED ORDER — AMPHETAMINE-DEXTROAMPHETAMINE 30 MG PO TABS: 30.0000 mg | ORAL_TABLET | Freq: Two times a day (BID) | ORAL | 0 refills | Status: DC

## 2023-06-16 MED ORDER — TRETINOIN 0.025 % EX GEL: Freq: Every day | CUTANEOUS | 6 refills | Status: DC

## 2023-06-16 MED ORDER — QUETIAPINE FUMARATE 25 MG PO TABS: 25.0000 mg | ORAL_TABLET | Freq: Every day | ORAL | 3 refills | Status: AC

## 2023-06-16 MED ORDER — AMPHETAMINE-DEXTROAMPHETAMINE 30 MG PO TABS: 1.0000 | ORAL_TABLET | Freq: Two times a day (BID) | ORAL | 0 refills | Status: DC

## 2023-06-16 MED ORDER — LUBIPROSTONE 24 MCG PO CAPS: 24.0000 ug | ORAL_CAPSULE | Freq: Two times a day (BID) | ORAL | 3 refills | Status: DC

## 2023-06-16 MED ORDER — TRETINOIN MICROSPHERE 0.1 % EX GEL: Freq: Every day | CUTANEOUS | 0 refills | Status: AC

## 2023-06-16 MED ORDER — VITAMIN D (ERGOCALCIFEROL) 1.25 MG (50000 UNIT) PO CAPS: 50000.0000 [IU] | ORAL_CAPSULE | ORAL | 0 refills | Status: DC

## 2023-06-16 MED ORDER — FAMOTIDINE 20 MG PO TABS: 20.0000 mg | ORAL_TABLET | Freq: Every day | ORAL | 3 refills | Status: DC

## 2023-06-16 NOTE — Patient Instructions (Addendum)
   After scoliosis back surgery, it can take several months to a year for your hips to appear straighter, with most noticeable improvement within the first few months, as the main focus of the surgery is to straighten the spine, which can indirectly improve hip alignment over time; however, the exact timeframe depends on the severity of your curve and your individual healing process.  Key points to remember: Immediate change: While the spine will be straightened immediately after surgery, noticeable hip alignment changes might take time due to muscle imbalances and soft tissue adaptations.  Physical therapy: Physical therapy plays a crucial role in optimizing hip alignment by strengthening core muscles and improving flexibility.    Continue to work on stretching and building strength in the abdomen/core and legs.
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
## 2023-06-20 DIAGNOSIS — G894 Chronic pain syndrome: Secondary | ICD-10-CM | POA: Diagnosis not present

## 2023-06-20 DIAGNOSIS — G8918 Other acute postprocedural pain: Secondary | ICD-10-CM | POA: Diagnosis not present

## 2023-06-22 ENCOUNTER — Ambulatory Visit (HOSPITAL_BASED_OUTPATIENT_CLINIC_OR_DEPARTMENT_OTHER): Payer: Medicare Other | Admitting: Physical Therapy

## 2023-06-23 ENCOUNTER — Ambulatory Visit (HOSPITAL_BASED_OUTPATIENT_CLINIC_OR_DEPARTMENT_OTHER): Payer: Medicare Other | Admitting: Physical Therapy

## 2023-06-26 ENCOUNTER — Ambulatory Visit (HOSPITAL_BASED_OUTPATIENT_CLINIC_OR_DEPARTMENT_OTHER): Payer: Medicare Other | Admitting: Physical Therapy

## 2023-06-26 NOTE — Assessment & Plan Note (Addendum)
Recommend daily use of colace and/or miralax to help reduce the slow transit constipation from opiate therapy. Continue Amitiza.
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
## 2023-06-26 NOTE — Assessment & Plan Note (Signed)
Chronically controlled with adderall 30mg  twice a day dosing. At this time she does appear to continue to have difficulty with recall and focus, however, given that she is at the maximum dosing of the medication we are unable to adjust at this time. She may want to consider psychiatry evaluation for additional recommendations for treatment if her attention and focus do not improve once her pain is better controlled. Refills provided today.

## 2023-06-26 NOTE — Assessment & Plan Note (Signed)
Recovering from recent spinal fusion surgery with significant improvement but persistent pain, numbness, and abdominal distention. Concerned about ribcage and hip alignment, which may take several months to a year to fully straighten. Resumed physical therapy without specific guidance from surgeon. Emphasized importance of avoiding heavy lifting, bending, and twisting to prevent damage.  - Continue physical therapy with focus on gait training and core strengthening - Avoid heavy lifting, bending, and twisting - Consider water physical therapy if feasible - Discuss specific postoperative exercises with physical therapist - Print and review postoperative care guidelines
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
## 2023-06-26 NOTE — Assessment & Plan Note (Signed)
Total improvement of chronic reflux with surgery to correct scoliosis. I do recommend that she consider ongoing famotidine PRN due to the bloating she is experiencing as this could be a on ongoing sign of gastric irritation. Particularly given her chronic use of pain medications, she is at increased risk of gastric damage and changes. GI evaluation may be beneficial. OK to send referral if patient desires.

## 2023-06-26 NOTE — Assessment & Plan Note (Signed)
Experiencing ongoing pain, currently on oxycodone, Suboxone, and other medications. Continues with oxycodone, reportedly more than prescribed, due to increased pain. Scheduled to see pain clinic on the 19th. Discussed limitations on prescribing more than 40 morphine milliequivalents per day due to current medication regimen and the importance of receiving medication management with pain management and her surgeon. At this time I am unable to safely assess the levels needed to control her pain level and given her high MME, consider additional treatment unsafe from a primary care standpoint.  - Advise contacting pain clinic and/or surgery for possible oxycodone refill and adjustment of recommended doses if needed more than prescribed. - Discuss limitations on prescribing more than 40 morphine milliequivalents per day - Encourage follow-up with pain clinic as scheduled

## 2023-06-27 ENCOUNTER — Encounter (HOSPITAL_BASED_OUTPATIENT_CLINIC_OR_DEPARTMENT_OTHER): Payer: Medicare Other | Admitting: Physical Therapy

## 2023-07-04 ENCOUNTER — Ambulatory Visit (HOSPITAL_BASED_OUTPATIENT_CLINIC_OR_DEPARTMENT_OTHER): Payer: Medicare Other | Admitting: Physical Therapy

## 2023-07-04 ENCOUNTER — Encounter (HOSPITAL_BASED_OUTPATIENT_CLINIC_OR_DEPARTMENT_OTHER): Payer: Self-pay

## 2023-07-04 ENCOUNTER — Telehealth: Payer: Self-pay | Admitting: Nurse Practitioner

## 2023-07-04 NOTE — Telephone Encounter (Signed)
Pt left message that needs refill on her Doxycycline to [facility] not [facility]

## 2023-07-04 NOTE — Telephone Encounter (Signed)
This is for her face breaking out, & she would like it cleared up by the [facility]

## 2023-07-05 MED ORDER — DOXYCYCLINE HYCLATE 100 MG PO TABS: 100.0000 mg | ORAL_TABLET | Freq: Two times a day (BID) | ORAL | 0 refills | Status: DC

## 2023-07-06 ENCOUNTER — Ambulatory Visit (HOSPITAL_BASED_OUTPATIENT_CLINIC_OR_DEPARTMENT_OTHER): Payer: Medicare Other | Attending: Physician Assistant | Admitting: Physical Therapy

## 2023-07-06 ENCOUNTER — Encounter (HOSPITAL_BASED_OUTPATIENT_CLINIC_OR_DEPARTMENT_OTHER): Payer: Self-pay | Admitting: Physical Therapy

## 2023-07-06 DIAGNOSIS — M5459 Other low back pain: Secondary | ICD-10-CM | POA: Insufficient documentation

## 2023-07-06 DIAGNOSIS — M546 Pain in thoracic spine: Secondary | ICD-10-CM | POA: Insufficient documentation

## 2023-07-06 DIAGNOSIS — M545 Low back pain, unspecified: Secondary | ICD-10-CM | POA: Diagnosis not present

## 2023-07-06 DIAGNOSIS — R293 Abnormal posture: Secondary | ICD-10-CM | POA: Diagnosis not present

## 2023-07-06 DIAGNOSIS — G8929 Other chronic pain: Secondary | ICD-10-CM | POA: Insufficient documentation

## 2023-07-06 DIAGNOSIS — R2689 Other abnormalities of gait and mobility: Secondary | ICD-10-CM | POA: Insufficient documentation
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
## 2023-07-11 DIAGNOSIS — Z981 Arthrodesis status: Secondary | ICD-10-CM | POA: Diagnosis not present

## 2023-07-11 DIAGNOSIS — M47812 Spondylosis without myelopathy or radiculopathy, cervical region: Secondary | ICD-10-CM | POA: Diagnosis not present

## 2023-07-11 DIAGNOSIS — M160 Bilateral primary osteoarthritis of hip: Secondary | ICD-10-CM | POA: Diagnosis not present

## 2023-07-11 DIAGNOSIS — M47815 Spondylosis without myelopathy or radiculopathy, thoracolumbar region: Secondary | ICD-10-CM | POA: Diagnosis not present

## 2023-07-11 DIAGNOSIS — M170 Bilateral primary osteoarthritis of knee: Secondary | ICD-10-CM | POA: Diagnosis not present

## 2023-07-12 ENCOUNTER — Ambulatory Visit (HOSPITAL_BASED_OUTPATIENT_CLINIC_OR_DEPARTMENT_OTHER): Payer: Medicare Other | Admitting: Physical Therapy

## 2023-07-14 ENCOUNTER — Ambulatory Visit (HOSPITAL_BASED_OUTPATIENT_CLINIC_OR_DEPARTMENT_OTHER): Payer: Medicare Other | Admitting: Physical Therapy

## 2023-07-14 DIAGNOSIS — M546 Pain in thoracic spine: Secondary | ICD-10-CM | POA: Diagnosis not present

## 2023-07-14 DIAGNOSIS — R2689 Other abnormalities of gait and mobility: Secondary | ICD-10-CM | POA: Diagnosis not present

## 2023-07-14 DIAGNOSIS — M545 Low back pain, unspecified: Secondary | ICD-10-CM | POA: Diagnosis not present

## 2023-07-14 DIAGNOSIS — M5459 Other low back pain: Secondary | ICD-10-CM | POA: Diagnosis not present

## 2023-07-14 DIAGNOSIS — R293 Abnormal posture: Secondary | ICD-10-CM

## 2023-07-14 DIAGNOSIS — G8929 Other chronic pain: Secondary | ICD-10-CM | POA: Diagnosis not present
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
## 2023-07-18 ENCOUNTER — Ambulatory Visit (HOSPITAL_BASED_OUTPATIENT_CLINIC_OR_DEPARTMENT_OTHER): Payer: Medicare Other | Admitting: Physical Therapy

## 2023-07-18 DIAGNOSIS — R293 Abnormal posture: Secondary | ICD-10-CM

## 2023-07-18 DIAGNOSIS — M546 Pain in thoracic spine: Secondary | ICD-10-CM

## 2023-07-18 DIAGNOSIS — M545 Low back pain, unspecified: Secondary | ICD-10-CM

## 2023-07-18 DIAGNOSIS — R2689 Other abnormalities of gait and mobility: Secondary | ICD-10-CM

## 2023-07-18 DIAGNOSIS — M5459 Other low back pain: Secondary | ICD-10-CM

## 2023-07-18 DIAGNOSIS — G8929 Other chronic pain: Secondary | ICD-10-CM | POA: Diagnosis not present

## 2023-07-19 ENCOUNTER — Encounter (HOSPITAL_BASED_OUTPATIENT_CLINIC_OR_DEPARTMENT_OTHER): Payer: Self-pay | Admitting: Physical Therapy

## 2023-07-19 ENCOUNTER — Telehealth: Payer: Self-pay | Admitting: Nurse Practitioner
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
## 2023-07-19 NOTE — Telephone Encounter (Signed)
Pt called in re Doxycycline rx, she did not pick up until recent She is having abdominal pain and not sure if related to doxy or not. She is not taking it with food, advised her to try taking with food and let us know if she is still having problems

## 2023-07-20 ENCOUNTER — Ambulatory Visit (HOSPITAL_BASED_OUTPATIENT_CLINIC_OR_DEPARTMENT_OTHER): Payer: Medicare Other | Admitting: Physical Therapy

## 2023-07-20 DIAGNOSIS — G8918 Other acute postprocedural pain: Secondary | ICD-10-CM | POA: Diagnosis not present

## 2023-07-20 DIAGNOSIS — G8929 Other chronic pain: Secondary | ICD-10-CM | POA: Diagnosis not present

## 2023-07-20 DIAGNOSIS — M545 Low back pain, unspecified: Secondary | ICD-10-CM | POA: Diagnosis not present

## 2023-07-20 DIAGNOSIS — G894 Chronic pain syndrome: Secondary | ICD-10-CM | POA: Diagnosis not present

## 2023-07-24 ENCOUNTER — Ambulatory Visit (HOSPITAL_BASED_OUTPATIENT_CLINIC_OR_DEPARTMENT_OTHER): Payer: Medicare Other | Admitting: Physical Therapy

## 2023-07-27 ENCOUNTER — Ambulatory Visit (HOSPITAL_BASED_OUTPATIENT_CLINIC_OR_DEPARTMENT_OTHER): Payer: Medicare Other | Admitting: Physical Therapy

## 2023-07-27 ENCOUNTER — Telehealth (HOSPITAL_BASED_OUTPATIENT_CLINIC_OR_DEPARTMENT_OTHER): Payer: Self-pay | Admitting: Physical Therapy

## 2023-07-27 NOTE — Telephone Encounter (Signed)
Called patient regarding no show appointment. Patient advised to call back to discuss plan when able.

## 2023-08-01 ENCOUNTER — Ambulatory Visit (HOSPITAL_BASED_OUTPATIENT_CLINIC_OR_DEPARTMENT_OTHER): Payer: Medicare Other | Admitting: Physical Therapy

## 2023-08-03 ENCOUNTER — Other Ambulatory Visit: Payer: Self-pay

## 2023-08-03 ENCOUNTER — Emergency Department (HOSPITAL_COMMUNITY): Payer: Medicare Other

## 2023-08-03 ENCOUNTER — Encounter (HOSPITAL_BASED_OUTPATIENT_CLINIC_OR_DEPARTMENT_OTHER): Payer: Medicare Other | Admitting: Physical Therapy

## 2023-08-03 ENCOUNTER — Emergency Department (HOSPITAL_COMMUNITY)
Admission: EM | Admit: 2023-08-03 | Discharge: 2023-08-03 | Disposition: A | Discharge status: Home / Self Care | Payer: Medicare Other | Attending: Emergency Medicine | Admitting: Emergency Medicine

## 2023-08-03 ENCOUNTER — Emergency Department (HOSPITAL_COMMUNITY)
Admission: EM | Admit: 2023-08-03 | Discharge: 2023-08-03 | Discharge status: Against Medical Advice | Payer: Medicare Other | Attending: Emergency Medicine | Admitting: Emergency Medicine

## 2023-08-03 DIAGNOSIS — W208XXA Other cause of strike by thrown, projected or falling object, initial encounter: Secondary | ICD-10-CM | POA: Insufficient documentation

## 2023-08-03 DIAGNOSIS — S0001XA Abrasion of scalp, initial encounter: Secondary | ICD-10-CM

## 2023-08-03 DIAGNOSIS — S02119A Unspecified fracture of occiput, initial encounter for closed fracture: Secondary | ICD-10-CM | POA: Diagnosis not present

## 2023-08-03 DIAGNOSIS — S0003XA Contusion of scalp, initial encounter: Secondary | ICD-10-CM | POA: Diagnosis not present

## 2023-08-03 DIAGNOSIS — Z23 Encounter for immunization: Secondary | ICD-10-CM | POA: Insufficient documentation

## 2023-08-03 DIAGNOSIS — Y92000 Kitchen of unspecified non-institutional (private) residence as  the place of occurrence of the external cause: Secondary | ICD-10-CM | POA: Insufficient documentation

## 2023-08-03 DIAGNOSIS — W228XXA Striking against or struck by other objects, initial encounter: Secondary | ICD-10-CM | POA: Insufficient documentation

## 2023-08-03 DIAGNOSIS — S0990XA Unspecified injury of head, initial encounter: Secondary | ICD-10-CM

## 2023-08-03 DIAGNOSIS — Z5321 Procedure and treatment not carried out due to patient leaving prior to being seen by health care provider: Secondary | ICD-10-CM | POA: Insufficient documentation

## 2023-08-03 MED ORDER — TETANUS-DIPHTH-ACELL PERTUSSIS 5-2.5-18.5 LF-MCG/0.5 IM SUSY
0.5000 mL | PREFILLED_SYRINGE | Freq: Once | INTRAMUSCULAR | Status: AC
  Administered 2023-08-03: 0.5 mL via INTRAMUSCULAR
  Filled 2023-08-03: qty 0.5

## 2023-08-03 NOTE — Discharge Instructions (Signed)
 Based on the events which brought you to the ER today, it is possible that you may have a concussion. A concussion occurs when there is a blow to the head or body, with enough force to shake the brain and disrupt how the brain functions. You may experience symptoms such as headaches, sensitivity to light/noise, dizziness, cognitive slowing, difficulty concentrating / remembering, trouble sleeping and drowsiness. These symptoms may last anywhere from hours/days to potentially weeks/months. While these symptoms are very frustrating and perhaps debilitating, it is important that you remember that they will improve over time. Everyone has a different rate of recovery; it is difficult to predict when your symptoms will resolve. In order to allow for your brain to heal after the injury, we recommend that you see your primary physician or a physician knowledgeable in concussion management. We also advise you to let your body and brain rest: avoid physical activities (sports, gym, and exercise) and reduce cognitive demands (reading, texting, TV watching, computer use, video games, etc). School attendance, after-school activities and work may need to be modified to avoid increasing symptoms. We recommend against driving until until all symptoms have resolved. Come back to the ER right away if you are having repeated episodes of vomiting, severe/worsening headache/dizziness or any other symptom that alarms you. We recommended that someone stay with you for the next 24 hours to monitor for these worrisome symptoms.   It was a pleasure caring for you today in the emergency department.  Please return to the emergency department for any worsening or worrisome symptoms.

## 2023-08-03 NOTE — ED Triage Notes (Signed)
 Patient reports weighing scale fell on her head at the kitchen 2 days ago , no LOC/ambulatory , reports dried blood at top scalp , denies headache , mild nausea .

## 2023-08-03 NOTE — ED Triage Notes (Signed)
 [facility] , triaged , then came here. Pt reports dx with flu x 2 days ago , pt reports weighing scale fell on her head at the kitchen 2 days ago , no LOC/ambulatory , reports dried blood at top scalp , denies headache , mild nausea . Pt reports spinal fusion surgery 4 months ago.
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
## 2023-08-08 ENCOUNTER — Encounter (HOSPITAL_BASED_OUTPATIENT_CLINIC_OR_DEPARTMENT_OTHER): Payer: Medicare Other | Admitting: Physical Therapy

## 2023-08-09 ENCOUNTER — Telehealth: Payer: Self-pay | Admitting: Nurse Practitioner

## 2023-08-09 DIAGNOSIS — L739 Follicular disorder, unspecified: Secondary | ICD-10-CM

## 2023-08-09 NOTE — Telephone Encounter (Signed)
 Pt needs refill on adderall 30mg   and also wants refill on Doxycycline ( for same issues with face)

## 2023-08-10 ENCOUNTER — Encounter: Payer: Self-pay | Admitting: Nurse Practitioner

## 2023-08-10 ENCOUNTER — Encounter (HOSPITAL_BASED_OUTPATIENT_CLINIC_OR_DEPARTMENT_OTHER): Payer: Medicare Other | Admitting: Physical Therapy

## 2023-08-11 ENCOUNTER — Telehealth: Payer: Self-pay | Admitting: Nurse Practitioner

## 2023-08-11 MED ORDER — DOXYCYCLINE HYCLATE 100 MG PO TABS: 100.0000 mg | ORAL_TABLET | Freq: Two times a day (BID) | ORAL | 0 refills | Status: DC

## 2023-08-11 NOTE — Telephone Encounter (Signed)
 Already a future order for 08/15/2023 placed with the pharmacy. Unable to authorize Deborah [name] fill.

## 2023-08-11 NOTE — Telephone Encounter (Signed)
 Adderall refill not available until 01/14 at earliest (this is 28 days from last prescription and allows for a buffer to avoid missed doses). Based on PDMP last script picked up 12/17. No earlier refills permitted.   Doxycycline sent.

## 2023-08-11 NOTE — Telephone Encounter (Signed)
 Pt would like to get her Adderall filled today because of the bad weather, she wants to know if you will authorize it to be filled early

## 2023-08-14 ENCOUNTER — Telehealth: Payer: Self-pay | Admitting: Nurse Practitioner

## 2023-08-14 ENCOUNTER — Other Ambulatory Visit: Payer: Self-pay

## 2023-08-14 DIAGNOSIS — K219 Gastro-esophageal reflux disease without esophagitis: Secondary | ICD-10-CM

## 2023-08-14 MED ORDER — OMEPRAZOLE 40 MG PO CPDR: 40.0000 mg | DELAYED_RELEASE_CAPSULE | Freq: Two times a day (BID) | ORAL | 1 refills | Status: AC

## 2023-08-14 NOTE — Telephone Encounter (Signed)
 Pt called regarding her Seroquel  & I called pharmacy & she last filled 06/17/23 & even at 2 a day should not be due for a month, earliest fill is 08/19/23, Adderall is due tomorrow.   She would like refill on Omeprazole , said famotidine  not working as well, checked with pharmacy & last filled by Camie Hun Omeprazole  [facility] has no refills.

## 2023-08-15 ENCOUNTER — Encounter (HOSPITAL_BASED_OUTPATIENT_CLINIC_OR_DEPARTMENT_OTHER): Payer: Medicare Other | Admitting: Physical Therapy

## 2023-08-16 DIAGNOSIS — M545 Low back pain, unspecified: Secondary | ICD-10-CM | POA: Diagnosis not present

## 2023-08-16 DIAGNOSIS — G8929 Other chronic pain: Secondary | ICD-10-CM | POA: Diagnosis not present

## 2023-08-16 DIAGNOSIS — G894 Chronic pain syndrome: Secondary | ICD-10-CM | POA: Diagnosis not present

## 2023-08-16 DIAGNOSIS — G8918 Other acute postprocedural pain: Secondary | ICD-10-CM | POA: Diagnosis not present

## 2023-08-16 DIAGNOSIS — M25511 Pain in right shoulder: Secondary | ICD-10-CM | POA: Diagnosis not present

## 2023-08-17 ENCOUNTER — Encounter (HOSPITAL_BASED_OUTPATIENT_CLINIC_OR_DEPARTMENT_OTHER): Payer: Medicare Other | Admitting: Physical Therapy

## 2023-08-19 NOTE — Telephone Encounter (Signed)
done

## 2023-08-29 ENCOUNTER — Encounter (HOSPITAL_BASED_OUTPATIENT_CLINIC_OR_DEPARTMENT_OTHER): Payer: Medicare Other | Admitting: Physical Therapy

## 2023-08-29 DIAGNOSIS — Z981 Arthrodesis status: Secondary | ICD-10-CM | POA: Diagnosis not present

## 2023-08-29 DIAGNOSIS — R293 Abnormal posture: Secondary | ICD-10-CM | POA: Diagnosis not present

## 2023-08-29 DIAGNOSIS — Z7409 Other reduced mobility: Secondary | ICD-10-CM | POA: Diagnosis not present

## 2023-08-29 DIAGNOSIS — M6281 Muscle weakness (generalized): Secondary | ICD-10-CM | POA: Diagnosis not present

## 2023-08-29 DIAGNOSIS — Z789 Other specified health status: Secondary | ICD-10-CM | POA: Diagnosis not present

## 2023-08-30 ENCOUNTER — Other Ambulatory Visit: Payer: Self-pay | Admitting: Orthopedic Surgery

## 2023-08-30 DIAGNOSIS — R293 Abnormal posture: Secondary | ICD-10-CM | POA: Insufficient documentation

## 2023-08-30 DIAGNOSIS — M4325 Fusion of spine, thoracolumbar region: Secondary | ICD-10-CM

## 2023-08-30 DIAGNOSIS — Z7409 Other reduced mobility: Secondary | ICD-10-CM | POA: Insufficient documentation

## 2023-08-30 DIAGNOSIS — Z981 Arthrodesis status: Secondary | ICD-10-CM

## 2023-08-30 DIAGNOSIS — M6281 Muscle weakness (generalized): Secondary | ICD-10-CM | POA: Insufficient documentation

## 2023-08-31 ENCOUNTER — Ambulatory Visit
Admission: RE | Admit: 2023-08-31 | Discharge: 2023-08-31 | Disposition: A | Discharge status: Home / Self Care | Payer: Medicare Other | Source: Ambulatory Visit | Attending: Orthopedic Surgery | Admitting: Orthopedic Surgery

## 2023-08-31 ENCOUNTER — Ambulatory Visit: Payer: Medicare Other | Admitting: Nurse Practitioner

## 2023-08-31 ENCOUNTER — Encounter (HOSPITAL_BASED_OUTPATIENT_CLINIC_OR_DEPARTMENT_OTHER): Payer: Medicare Other | Admitting: Physical Therapy

## 2023-08-31 DIAGNOSIS — Z981 Arthrodesis status: Secondary | ICD-10-CM | POA: Diagnosis not present

## 2023-08-31 DIAGNOSIS — M4325 Fusion of spine, thoracolumbar region: Secondary | ICD-10-CM

## 2023-09-01 ENCOUNTER — Other Ambulatory Visit: Payer: Self-pay | Admitting: Orthopedic Surgery

## 2023-09-01 ENCOUNTER — Ambulatory Visit
Admission: RE | Admit: 2023-09-01 | Discharge: 2023-09-01 | Disposition: A | Discharge status: Home / Self Care | Payer: Medicare Other | Source: Ambulatory Visit | Attending: Orthopedic Surgery | Admitting: Orthopedic Surgery

## 2023-09-01 DIAGNOSIS — M4320 Fusion of spine, site unspecified: Secondary | ICD-10-CM

## 2023-09-01 DIAGNOSIS — Z981 Arthrodesis status: Secondary | ICD-10-CM | POA: Diagnosis not present

## 2023-09-06 ENCOUNTER — Other Ambulatory Visit: Payer: Self-pay | Admitting: Nurse Practitioner

## 2023-09-06 ENCOUNTER — Ambulatory Visit: Payer: Medicare Other | Admitting: Family

## 2023-09-06 DIAGNOSIS — R293 Abnormal posture: Secondary | ICD-10-CM | POA: Diagnosis not present

## 2023-09-06 DIAGNOSIS — Z789 Other specified health status: Secondary | ICD-10-CM | POA: Diagnosis not present

## 2023-09-06 DIAGNOSIS — E559 Vitamin D deficiency, unspecified: Secondary | ICD-10-CM

## 2023-09-06 DIAGNOSIS — Z981 Arthrodesis status: Secondary | ICD-10-CM | POA: Diagnosis not present

## 2023-09-06 DIAGNOSIS — M6281 Muscle weakness (generalized): Secondary | ICD-10-CM | POA: Diagnosis not present

## 2023-09-06 DIAGNOSIS — Z7409 Other reduced mobility: Secondary | ICD-10-CM | POA: Diagnosis not present

## 2023-09-12 ENCOUNTER — Telehealth: Payer: Self-pay

## 2023-09-12 ENCOUNTER — Telehealth: Payer: Self-pay | Admitting: Nurse Practitioner

## 2023-09-12 DIAGNOSIS — F900 Attention-deficit hyperactivity disorder, predominantly inattentive type: Secondary | ICD-10-CM

## 2023-09-12 DIAGNOSIS — E559 Vitamin D deficiency, unspecified: Secondary | ICD-10-CM

## 2023-09-12 NOTE — Telephone Encounter (Signed)
Pt needs refill on Vitamin D to [facility]

## 2023-09-12 NOTE — Telephone Encounter (Signed)
Pt called back wants Rx to go to [facility]

## 2023-09-12 NOTE — Telephone Encounter (Signed)
Pt. Called just as a reminder that her Adderall is due for refill coming up later this week and wanted it called in before the weekend to [facility] on [address]. Last apt 06/16/23 next apt. 09/19/23. Last refilled 08/15/23.

## 2023-09-13 ENCOUNTER — Ambulatory Visit: Payer: Medicare Other | Admitting: Family

## 2023-09-14 DIAGNOSIS — M4325 Fusion of spine, thoracolumbar region: Secondary | ICD-10-CM | POA: Diagnosis not present

## 2023-09-14 DIAGNOSIS — R2689 Other abnormalities of gait and mobility: Secondary | ICD-10-CM | POA: Diagnosis not present

## 2023-09-14 MED ORDER — AMPHETAMINE-DEXTROAMPHETAMINE 30 MG PO TABS: 1.0000 | ORAL_TABLET | Freq: Two times a day (BID) | ORAL | 0 refills | Status: DC

## 2023-09-14 MED ORDER — AMPHETAMINE-DEXTROAMPHETAMINE 30 MG PO TABS: 30.0000 mg | ORAL_TABLET | Freq: Two times a day (BID) | ORAL | 0 refills | Status: DC

## 2023-09-14 MED ORDER — VITAMIN D (ERGOCALCIFEROL) 1.25 MG (50000 UNIT) PO CAPS
50000.0000 [IU] | ORAL_CAPSULE | ORAL | 3 refills | Status: AC
Start: 2023-09-14 — End: ?

## 2023-09-14 NOTE — Telephone Encounter (Signed)
Prescription sent

## 2023-09-14 NOTE — Telephone Encounter (Signed)
Script sent with 2 future refills.

## 2023-09-19 ENCOUNTER — Ambulatory Visit: Payer: Medicare Other | Admitting: Nurse Practitioner

## 2023-09-20 DIAGNOSIS — G894 Chronic pain syndrome: Secondary | ICD-10-CM | POA: Diagnosis not present

## 2023-09-20 DIAGNOSIS — Z79891 Long term (current) use of opiate analgesic: Secondary | ICD-10-CM | POA: Diagnosis not present

## 2023-09-20 DIAGNOSIS — G8918 Other acute postprocedural pain: Secondary | ICD-10-CM | POA: Diagnosis not present

## 2023-09-27 DIAGNOSIS — Z7409 Other reduced mobility: Secondary | ICD-10-CM | POA: Diagnosis not present

## 2023-09-27 DIAGNOSIS — Z789 Other specified health status: Secondary | ICD-10-CM | POA: Diagnosis not present

## 2023-09-27 DIAGNOSIS — Z981 Arthrodesis status: Secondary | ICD-10-CM | POA: Diagnosis not present

## 2023-09-27 DIAGNOSIS — R293 Abnormal posture: Secondary | ICD-10-CM | POA: Diagnosis not present

## 2023-09-27 DIAGNOSIS — M6281 Muscle weakness (generalized): Secondary | ICD-10-CM | POA: Diagnosis not present

## 2023-09-28 ENCOUNTER — Telehealth: Payer: Self-pay | Admitting: Nurse Practitioner

## 2023-09-28 NOTE — Telephone Encounter (Signed)
 Reason for CRM: The patient is requesting for an order to be sent in to receive home healthcare to concentrate on her wellness and feeling better, she would like this process expedited.    Callback [phone]

## 2023-09-29 ENCOUNTER — Ambulatory Visit: Payer: Medicare Other | Admitting: Nurse Practitioner

## 2023-09-29 ENCOUNTER — Other Ambulatory Visit: Payer: Self-pay

## 2023-09-29 DIAGNOSIS — M4310 Spondylolisthesis, site unspecified: Secondary | ICD-10-CM

## 2023-09-29 DIAGNOSIS — R269 Unspecified abnormalities of gait and mobility: Secondary | ICD-10-CM

## 2023-09-29 DIAGNOSIS — G894 Chronic pain syndrome: Secondary | ICD-10-CM

## 2023-09-29 DIAGNOSIS — M5030 Other cervical disc degeneration, unspecified cervical region: Secondary | ICD-10-CM

## 2023-09-29 DIAGNOSIS — M4130 Thoracogenic scoliosis, site unspecified: Secondary | ICD-10-CM

## 2023-10-02 ENCOUNTER — Other Ambulatory Visit: Payer: Self-pay | Admitting: Orthopedic Surgery

## 2023-10-02 DIAGNOSIS — R2689 Other abnormalities of gait and mobility: Secondary | ICD-10-CM

## 2023-10-02 DIAGNOSIS — M4325 Fusion of spine, thoracolumbar region: Secondary | ICD-10-CM

## 2023-10-03 ENCOUNTER — Encounter: Payer: Self-pay | Admitting: Family

## 2023-10-03 ENCOUNTER — Ambulatory Visit (INDEPENDENT_AMBULATORY_CARE_PROVIDER_SITE_OTHER): Payer: Medicare Other | Admitting: Family

## 2023-10-03 DIAGNOSIS — M6701 Short Achilles tendon (acquired), right ankle: Secondary | ICD-10-CM

## 2023-10-03 DIAGNOSIS — M7741 Metatarsalgia, right foot: Secondary | ICD-10-CM | POA: Diagnosis not present
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
## 2023-10-07 ENCOUNTER — Ambulatory Visit
Admission: RE | Admit: 2023-10-07 | Discharge: 2023-10-07 | Disposition: A | Discharge status: Home / Self Care | Source: Ambulatory Visit | Attending: Orthopedic Surgery | Admitting: Orthopedic Surgery

## 2023-10-07 DIAGNOSIS — R2689 Other abnormalities of gait and mobility: Secondary | ICD-10-CM

## 2023-10-07 DIAGNOSIS — M4325 Fusion of spine, thoracolumbar region: Secondary | ICD-10-CM | POA: Diagnosis not present

## 2023-10-07 DIAGNOSIS — M4802 Spinal stenosis, cervical region: Secondary | ICD-10-CM | POA: Diagnosis not present

## 2023-10-09 ENCOUNTER — Other Ambulatory Visit: Payer: Self-pay | Admitting: Nurse Practitioner

## 2023-10-09 DIAGNOSIS — F900 Attention-deficit hyperactivity disorder, predominantly inattentive type: Secondary | ICD-10-CM

## 2023-10-09 NOTE — Telephone Encounter (Unsigned)
 Copied from CRM [id number]. Topic: Clinical - Medication Refill >> Oct 09, 2023 12:18 PM Geroge Baseman wrote: Most Recent Primary Care Visit:  Provider: EARLY, SARA E  Department: [facility]  Visit Type: ACUTE  Date: 06/16/2023  Medication: amphetamine-dextroamphetamine (ADDERALL) 30 MG tablet  Has the patient contacted their pharmacy? No (Agent: If no, request that the patient contact the pharmacy for the refill. If patient does not wish to contact the pharmacy document the reason why and proceed with request.) (Agent: If yes, when and what did the pharmacy advise?)  Is this the correct pharmacy for this prescription? Yes If no, delete pharmacy and type the correct one.  This is the patient's preferred pharmacy:    [id number] - [place], [place] - [address] AT [address] [facility] [address] [place] [address] Phone: [phone] Fax: [phone]   Has the prescription been filled recently? No  Is the patient out of the medication? No  Has the patient been seen for an appointment in the last year OR does the patient have an upcoming appointment? No  Can we respond through MyChart? No  Agent: Please be advised that Rx refills may take up to 3 business days. We ask that you follow-up with your pharmacy.

## 2023-10-11 ENCOUNTER — Ambulatory Visit: Payer: Medicare Other | Admitting: Family

## 2023-10-12 ENCOUNTER — Ambulatory Visit
Admission: RE | Admit: 2023-10-12 | Discharge: 2023-10-12 | Disposition: A | Discharge status: Home / Self Care | Source: Ambulatory Visit | Attending: Orthopedic Surgery | Admitting: Orthopedic Surgery

## 2023-10-12 DIAGNOSIS — R2689 Other abnormalities of gait and mobility: Secondary | ICD-10-CM | POA: Diagnosis not present

## 2023-10-12 DIAGNOSIS — M4325 Fusion of spine, thoracolumbar region: Secondary | ICD-10-CM | POA: Diagnosis not present

## 2023-10-18 ENCOUNTER — Encounter: Payer: Self-pay | Admitting: Family

## 2023-10-18 ENCOUNTER — Telehealth: Payer: Self-pay | Admitting: Family

## 2023-10-18 ENCOUNTER — Ambulatory Visit (INDEPENDENT_AMBULATORY_CARE_PROVIDER_SITE_OTHER): Admitting: Family

## 2023-10-18 DIAGNOSIS — M7741 Metatarsalgia, right foot: Secondary | ICD-10-CM
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
## 2023-10-18 NOTE — Telephone Encounter (Signed)
 Patient wants a call with more detailed information about the surgery

## 2023-10-19 ENCOUNTER — Ambulatory Visit: Admitting: Family Medicine

## 2023-10-19 ENCOUNTER — Telehealth: Payer: Self-pay | Admitting: Family Medicine

## 2023-10-19 ENCOUNTER — Ambulatory Visit: Payer: Self-pay

## 2023-10-19 VITALS — BP 110/70 | HR 90 | Wt 106.2 lb

## 2023-10-19 DIAGNOSIS — F900 Attention-deficit hyperactivity disorder, predominantly inattentive type: Secondary | ICD-10-CM

## 2023-10-19 DIAGNOSIS — G894 Chronic pain syndrome: Secondary | ICD-10-CM

## 2023-10-19 DIAGNOSIS — R269 Unspecified abnormalities of gait and mobility: Secondary | ICD-10-CM

## 2023-10-19 DIAGNOSIS — M542 Cervicalgia: Secondary | ICD-10-CM

## 2023-10-19 DIAGNOSIS — S0033XA Contusion of nose, initial encounter: Secondary | ICD-10-CM

## 2023-10-19 DIAGNOSIS — Z9049 Acquired absence of other specified parts of digestive tract: Secondary | ICD-10-CM

## 2023-10-19 DIAGNOSIS — F332 Major depressive disorder, recurrent severe without psychotic features: Secondary | ICD-10-CM

## 2023-10-19 DIAGNOSIS — M4310 Spondylolisthesis, site unspecified: Secondary | ICD-10-CM

## 2023-10-19 DIAGNOSIS — M5030 Other cervical disc degeneration, unspecified cervical region: Secondary | ICD-10-CM

## 2023-10-19 DIAGNOSIS — M4130 Thoracogenic scoliosis, site unspecified: Secondary | ICD-10-CM

## 2023-10-19 DIAGNOSIS — F5104 Psychophysiologic insomnia: Secondary | ICD-10-CM

## 2023-10-19 DIAGNOSIS — Z9889 Other specified postprocedural states: Secondary | ICD-10-CM

## 2023-10-19 MED ORDER — DICLOFENAC SODIUM 75 MG PO TBEC: 75.0000 mg | DELAYED_RELEASE_TABLET | Freq: Two times a day (BID) | ORAL | 0 refills | Status: DC

## 2023-10-19 NOTE — Progress Notes (Signed)
   Subjective:    Patient ID: Deborah [name], female    DOB: 21-Apr-1956, 68 y.o.   MRN: [id number]  HPI She states that 2 days ago she fell forward while sitting on the commode and hit her nose she thinks more on the right side and is now experiencing pain and wondering about fracture.   Review of Systems     Objective:    Physical Exam Alert and complaining of nasal pain.  Exam of the face shows normal nasal alignment.  Nasal mucosa on the right was slightly erythematous but appeared patent.  Left nasal mucosa also appeared normal.       Assessment & Plan:  Contusion of nose, initial encounter - Plan: diclofenac (VOLTAREN) 75 MG EC tablet I explained that even if she had a fracture, everything was in alignment when examining her nose and therefore no x-ray was really needed at this point.  She seemed comfortable with that. I reviewed her medication list and PDMP and decided it was appropriate to use an NSAID rather than tramadol.

## 2023-10-19 NOTE — Telephone Encounter (Signed)
 Patient came back in the office after talking with the call center, she felt they had interrogated her and wanted to send her to the hospital.  Patient verbalized she was having an adverse reaction to her new medication of Voltaren.  I spoke with Dr. Susann Givens he will change her back to Ibuprofen and said to hold ice to her nose.  She said she needed an healthcare advocate, I explained I would have Huntley Dec put in a referral for one. [id number].  Share info with Enid Skeens.  Will note in chart adverse reaction to Voltaren.

## 2023-10-19 NOTE — Addendum Note (Signed)
 Addended by: Malvina Schadler, Huntley Dec E on: 10/19/2023 07:07 PM   Modules accepted: Orders

## 2023-10-19 NOTE — Telephone Encounter (Signed)
 Deborah [name], Thank you for handling this and trying to educate her on the safest practice.   Adam, She came to the office and spoke with Lafonda Mosses. If she calls again, I recommend she avoid the voltaren given the symptoms she experienced after taking. It is possible she took the medication on an empty stomach, which is why she had the episode of nausea and vomiting, but we cannot rule out a reaction of other sort.   For her chronic back pain/surgery pain, she is managed with the pain clinic at [facility]. They are working to taper her post-operative pain medication off (7 months post op), but have been unsuccessful. She is also on suboxone for pain management.   Given her current medications and management with a pain specialist, we will not provide any additional narcotics or sedative medications. We can provide meloxicam, naproxen, or ibuprofen to help with any inflammatory pain she may have.  .  If she is continuing to have pain from her surgery, I recommend that she reach out to her spinal surgeon or pain management provider to discuss the best ways to manage.

## 2023-10-19 NOTE — Telephone Encounter (Addendum)
 Chief Complaint: dizziness Symptoms: episode of dizziness, nausea, diaphoresis, heart palpitations Frequency: today at 1400  Pertinent Negatives: Patient denies CP, SOB, headache, blurry vision, one-sided weakness Disposition: [x] ED /[] Urgent Care (no appt availability in office) / [] Appointment(In office/virtual)/ []  [facility]/ [] Home Care/ [x] Refused Recommended Disposition /[] [facility]/ []  Follow-up with PCP Additional Notes: Pt reports episode of dizziness, nausea, and diaphoresis at [employer] at approx 1400 this afternoon lasting 15 minutes. Pt also endorses heart palpitations at [facility]. Pt states she left [employer] and vomited bile at home. Pt reports abdominal pain and pain from spinal surgery. Pt states she has 6-8/10 pain daily. Pt denies dizziness at this time but states she does not feel well, is nauseous, and is in pain.  Pt was seen in the office for something unrelated today and went to [facility] after. Pt was prescribed PO voltaren at her office visit today and took one prior to today's symptoms and believes her symptoms are due to an allergy to Voltaren.  Given pt's symptoms while at [facility] today, RN advised she should go to the ED to rule out a cardiac cause for her symptoms. Pt got upset with this RN and declined to go to the ED. RN attempted to educate the pt on why the ED is the best place for her symptoms.  RN advised pt she would relay today's symptoms and concern about Voltaren to the appropriate people for follow-up. Pt has an appt on 3/24, scheduled prior to this conversation.  Copied from CRM [id number]. Topic: Clinical - Red Word Triage >> Oct 19, 2023  3:03 PM Ivette P wrote: Red Word that prompted transfer to Nurse Triage: medication not agreeing with her, naseuos and dizzy and was going to throw up. Walked out of [employer]. been on strong medicaiton. cant take tremodal. voltaren was the medication prescribed. Reason for Disposition  Extra heartbeats,  irregular heart beating, or heart is beating very fast  (i.e., "palpitations")  Answer Assessment - Initial Assessment Questions 1. DESCRIPTION: "Describe your dizziness."     "It felt like I was going to fall over" 2. LIGHTHEADED: "Do you feel lightheaded?" (e.g., somewhat faint, woozy, weak upon standing)     Woozy 3. VERTIGO: "Do you feel like either you or the room is spinning or tilting?" (i.e. vertigo)     No 4. SEVERITY: "How bad is it?"  "Do you feel like you are going to faint?" "Can you stand and walk?"   - MILD: Feels slightly dizzy, but walking normally.   - MODERATE: Feels unsteady when walking, but not falling; interferes with normal activities (e.g., school, work).   - SEVERE: Unable to walk without falling, or requires assistance to walk without falling; feels like passing out now.      "Feel steady on my feet, dizziness is not continuous, had two spinal fusions but I can walk"  5. ONSET:  "When did the dizziness begin?"     Dizzy for 15 minutes today at [facility] 6. AGGRAVATING FACTORS: "Does anything make it worse?" (e.g., standing, change in head position)     N/A 7. HEART RATE: "Can you tell me your heart rate?" "How many beats in 15 seconds?"  (Note: not all patients can do this)       N/A 8. CAUSE: "What do you think is causing the dizziness?"     Voltaren 9. RECURRENT SYMPTOM: "Have you had dizziness before?" If Yes, ask: "When was the last time?" "What happened that time?"     "  I am almost 68 years old, so I have been dizzy" 10. OTHER SYMPTOMS: "Do you have any other symptoms?" (e.g., fever, chest pain, vomiting, diarrhea, bleeding)       Endorses nausea and sweating with dizziness at [facility]. "I did have a bit of racing heart when I bent over." Denies CP or SOB. Vomited 1 at home after. Denies one-sided weakness. Denies headache and blurry vision. "On Tramadol I had dizziness and upset stomach", does not take that anymore.  Prescribed voltaren 2x daily PO. I  didn't feel bad "right away", maybe 40 minutes to an hour after.  "I'm in pain, my stomach is upset. Gets stomach pain all the time. Spinal surgery recently, pain from that." 6-8/10 pain daily. "I just had 90 stitches in my back."  Protocols used: Dizziness - Lightheadedness-A-AH

## 2023-10-19 NOTE — Patient Instructions (Signed)
 You can take 2 Tylenol 4 times per day and Voltaren twice per day .  Do not take ibuprofen.  I do see what that we will do phoria okay

## 2023-10-20 ENCOUNTER — Telehealth: Payer: Self-pay | Admitting: *Deleted

## 2023-10-20 NOTE — Progress Notes (Signed)
 Complex Care Management Note  Care Guide Note 10/20/2023 Name: LINDI ABRAM MRN: [id number] DOB: 1956/04/21  GLORI MACHNIK is a 68 y.o. year old female who sees Early, Sung Amabile, NP for primary care. I reached out to Darrick Grinder by phone today to offer complex care management services.  Ms. Sinquefield was given information about Complex Care Management services today including:   The Complex Care Management services include support from the care team which includes your Nurse Care Manager, Clinical Social Worker, or Pharmacist.  The Complex Care Management team is here to help remove barriers to the health concerns and goals most important to you. Complex Care Management services are voluntary, and the patient may decline or stop services at any time by request to their care team member.   Complex Care Management Consent Status: Patient agreed to services and verbal consent obtained.   Follow up plan:  Telephone appointment with complex care management team member scheduled for:  4/7 with RNCM and 3/27 with BSW  Encounter Outcome:  Patient Scheduled  Gwenevere Ghazi  [facility]  [facility], [facility] Guide  Direct Dial: [phone]  Fax [phone]

## 2023-10-23 ENCOUNTER — Ambulatory Visit: Admitting: Nurse Practitioner

## 2023-10-23 ENCOUNTER — Encounter: Payer: Self-pay | Admitting: Nurse Practitioner

## 2023-10-23 VITALS — BP 120/78 | HR 64 | Wt 106.2 lb

## 2023-10-23 DIAGNOSIS — M79671 Pain in right foot: Secondary | ICD-10-CM | POA: Diagnosis not present

## 2023-10-23 DIAGNOSIS — R296 Repeated falls: Secondary | ICD-10-CM

## 2023-10-23 DIAGNOSIS — R269 Unspecified abnormalities of gait and mobility: Secondary | ICD-10-CM | POA: Diagnosis not present

## 2023-10-23 DIAGNOSIS — G894 Chronic pain syndrome: Secondary | ICD-10-CM | POA: Diagnosis not present

## 2023-10-23 DIAGNOSIS — F332 Major depressive disorder, recurrent severe without psychotic features: Secondary | ICD-10-CM

## 2023-10-23 DIAGNOSIS — R52 Pain, unspecified: Secondary | ICD-10-CM

## 2023-10-23 NOTE — Patient Instructions (Addendum)
 Please keep your appointment with the care consultants to see if they have any suggestions for assistance that may benefit you.   Keep working on physical therapy activities at home.  I do feel that gait training would be helpful to get you stronger and back to a baseline since the surgery. If you would like this referral to be placed then you can let us know and we will send this.   Keep working with pain management to help with your pain control. There are limitations to any other provider prescribing this.

## 2023-10-23 NOTE — Assessment & Plan Note (Signed)
 Patient reports great amount of breakthrough pain despite pain management with Saboxone and Oxycodone. Visit today primarily revolved around her frustrations with inability to receive additional pain medications from pain management and other providers. Attempts to explain that management with physical therapy and other measures are vital to help with her pain control, however, she strongly expresses that these are not helpful. I have explained that it is vital that she only seek pain medications from her pain management provider due to the increased risks associated with the medications she is taking including decreased breathing, heart rate, blood pressure, and altered mental status. I do have concerns with her demeanor today. She was drowsy and started to drift off to sleep multiple times in mid sentence in addition to slurred speech and difficulty completing her thoughts. It is unclear if this is related to medications.
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
## 2023-10-24 DIAGNOSIS — Z981 Arthrodesis status: Secondary | ICD-10-CM | POA: Diagnosis not present

## 2023-10-24 DIAGNOSIS — M4325 Fusion of spine, thoracolumbar region: Secondary | ICD-10-CM | POA: Diagnosis not present

## 2023-10-25 ENCOUNTER — Telehealth: Payer: Self-pay | Admitting: Nurse Practitioner

## 2023-10-25 NOTE — Assessment & Plan Note (Signed)
 Foot pain and swelling, likely related to previous surgeries and hardware placement. Reports increased pain, inability to bear weight, and difficulty wearing shoes due to swelling. Scheduled for surgery next week. Concerned about post-operative pain management and support during recovery. - Proceed with scheduled foot surgery next week - Coordinate post-operative care and transportation needs

## 2023-10-25 NOTE — Telephone Encounter (Signed)
 Patient was transferred to office by [employer] rep, who stated patient demanding to speak to office and was not being nice to them  Spoke to patient and she wanted to know what to do if she needed to go to hospital, advised her that the doctors at the hospital would take care of her. Patient states she has called EMS and is going to ER for her shoulder

## 2023-10-25 NOTE — Assessment & Plan Note (Signed)
 Experiences frustration and anxiety related to medical conditions and treatment. Previous psychiatric evaluation for pain medication candidacy. Not currently engaged with a counselor; previous case management was inadequate. - Follow up with patient advocate and counseling services for support - Ensure completion of interviews with patient referral advocate center
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
## 2023-10-25 NOTE — Assessment & Plan Note (Signed)
 Chronic pain associated with previous spinal fusions and scoliosis. Reports inadequate pain control with current Suboxone regimen and difficulty obtaining additional medication despite attempts from multiple providers.  Concerned about condition progression and quality of life. Discussed potential palliative care referral for improved pain management. - Discuss with pain management team about current pain control and potential adjustments - Consider referral to palliative care for pain management evaluation

## 2023-10-26 ENCOUNTER — Ambulatory Visit: Payer: Self-pay

## 2023-10-26 NOTE — Patient Instructions (Signed)
 Visit Information  Thank you for taking time to visit with me today. Please don't hesitate to contact me if I can be of assistance to you.   Following are the goals we discussed today:  Patient will await response from [facility] on [facility] referral.   Our next appointment is by telephone on 11/24/23 at [facility].  Please call the care guide team at [phone] if you need to cancel or reschedule your appointment.   If you are experiencing a Mental Health or Behavioral Health Crisis or need someone to talk to, please call 911  Patient verbalizes understanding of instructions and care plan provided today and agrees to view in MyChart. Active MyChart status and patient understanding of how to access instructions and care plan via MyChart confirmed with patient.     No further follow up required: 11/24/23 11am with Remigio Eisenmenger.  Lysle Morales, BSW [place]  [facility], [facility] Social Worker Direct Dial: [phone]  Fax: [phone] Website: [email or web address]

## 2023-10-26 NOTE — Patient Outreach (Signed)
 Care Coordination   Initial Visit Note   10/26/2023 Name: Deborah [name] MRN: [id number] DOB: May 20, 1956  Deborah [name] is a 68 y.o. year old female who sees Early, Sung Amabile, NP for primary care. I spoke with  Darrick Grinder by phone today.  What matters to the patients health and wellness today?  Patient needs personal care services.     Goals Addressed             This Visit's Progress    Care Coordination Activities       Interventions Today    Flowsheet Row Most Recent Value  General Interventions   General Interventions Discussed/Reviewed General Interventions Discussed, General Interventions Reviewed, Level of Care  [Pt needs assistance with Personal Care in the home.Pt struggles with standing & performing ADL's.Food is delivered & pt sister checks on her sometimes.PCS referral is completed and submited to provider for review.]  Level of Care Personal Care Services  [facility] application completed. SW explained process for services.]              SDOH assessments and interventions completed:  Yes  SDOH Interventions Today    Flowsheet Row Most Recent Value  SDOH Interventions   Food Insecurity Interventions --  [gets foodstamps]  Housing Interventions Intervention Not Indicated  Transportation Interventions Intervention Not Indicated  [Drives]  Utilities Interventions Intervention Not Indicated        Care Coordination Interventions:  Yes, provided   Follow up plan: Follow up call scheduled for 11/24/23 at 11am with Remigio Eisenmenger.    Encounter Outcome:  Patient Visit Completed

## 2023-10-27 ENCOUNTER — Ambulatory Visit (HOSPITAL_COMMUNITY)
Admission: EM | Admit: 2023-10-27 | Discharge: 2023-10-27 | Disposition: A | Discharge status: Home / Self Care | Attending: Emergency Medicine | Admitting: Emergency Medicine

## 2023-10-27 ENCOUNTER — Ambulatory Visit (INDEPENDENT_AMBULATORY_CARE_PROVIDER_SITE_OTHER)

## 2023-10-27 ENCOUNTER — Encounter (HOSPITAL_COMMUNITY): Payer: Self-pay

## 2023-10-27 DIAGNOSIS — W19XXXA Unspecified fall, initial encounter: Secondary | ICD-10-CM

## 2023-10-27 DIAGNOSIS — T148XXA Other injury of unspecified body region, initial encounter: Secondary | ICD-10-CM

## 2023-10-27 DIAGNOSIS — M546 Pain in thoracic spine: Secondary | ICD-10-CM

## 2023-10-27 DIAGNOSIS — Z4789 Encounter for other orthopedic aftercare: Secondary | ICD-10-CM | POA: Diagnosis not present

## 2023-10-27 MED ORDER — KETOROLAC TROMETHAMINE 10 MG PO TABS: 10.0000 mg | ORAL_TABLET | Freq: Three times a day (TID) | ORAL | 0 refills | Status: DC | PRN

## 2023-10-27 NOTE — Discharge Instructions (Addendum)
 I have prescribed ketorolac that you can take every 8 hours as needed for pain. Otherwise you can alternate this with Tylenol every 4-6 hours as needed.   As discussed due to you currently being on Suboxone I cannot prescribe you anything stronger than this and you will need to follow-up with your pain management doctor for further pain control.  I recommend applying ice to the hematoma on your back to assist with swelling.  This should eventually heal on its own but may take some time.  Return here as needed.  If your pain continues to be severe, you develop numbness or weakness, or fall and hit your head please seek immediate medical treatment in the ER.
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
## 2023-10-27 NOTE — ED Triage Notes (Addendum)
 Patient states she fell on "her bottom" and then hit a table or something with my back in the middle." Patient states she has had spinal surgery x 3.  Patient states she is here because she wants to make sure nothing has happened to her back.  Patient states she takes Suboxone and duloxetine.  Patient added that she drove herself to the [facility]

## 2023-10-31 ENCOUNTER — Telehealth: Payer: Self-pay | Admitting: Nurse Practitioner

## 2023-10-31 NOTE — Telephone Encounter (Signed)
 Reason for CRM: [phone]- patient asking if FL2 form was sent to DSS, does not want to be put in a nursing home, please call to discuss

## 2023-10-31 NOTE — Telephone Encounter (Signed)
 Copied from CRM [id number]. Topic: General - Other >> Oct 31, 2023  3:26 PM Shelah Lewandowsky wrote: Reason for CRM: [id number]- patient asking if FL2 form was sent to DSS, does not want to be put in a nursing home, please call to discuss

## 2023-11-01 ENCOUNTER — Other Ambulatory Visit: Payer: Self-pay | Admitting: Nurse Practitioner

## 2023-11-01 DIAGNOSIS — M419 Scoliosis, unspecified: Secondary | ICD-10-CM | POA: Diagnosis not present

## 2023-11-01 DIAGNOSIS — G8918 Other acute postprocedural pain: Secondary | ICD-10-CM | POA: Diagnosis not present

## 2023-11-01 DIAGNOSIS — Z79891 Long term (current) use of opiate analgesic: Secondary | ICD-10-CM | POA: Diagnosis not present

## 2023-11-01 DIAGNOSIS — M4325 Fusion of spine, thoracolumbar region: Secondary | ICD-10-CM

## 2023-11-01 DIAGNOSIS — M545 Low back pain, unspecified: Secondary | ICD-10-CM | POA: Diagnosis not present

## 2023-11-01 DIAGNOSIS — G8929 Other chronic pain: Secondary | ICD-10-CM | POA: Diagnosis not present

## 2023-11-01 DIAGNOSIS — G894 Chronic pain syndrome: Secondary | ICD-10-CM | POA: Diagnosis not present

## 2023-11-01 NOTE — Progress Notes (Signed)
 Completion of DHB-3051 for Personal Care Services through [employer] [facility] sent by [facility], BSW.

## 2023-11-01 NOTE — Telephone Encounter (Signed)
 I have not received an FL2 form nor have I sent anything to DSS for her. I am unaware of any recommendations for a nursing facility.   I did receive a DHB-3051 form, which is a request for Personal Care Services (PCS) through Medicare. This came from the social worker who spoke with her. I am working on this now. If she no longer wishes for PCS, I will disregard.

## 2023-11-03 ENCOUNTER — Telehealth: Payer: Self-pay | Admitting: Nurse Practitioner

## 2023-11-03 NOTE — Telephone Encounter (Signed)
 Reason for CRM: pt called in, I let her know that PCs form was received and being worked on. She stated has to be turned in by/ 04/07

## 2023-11-03 NOTE — Telephone Encounter (Signed)
 Forms were faxed 11/01/23

## 2023-11-03 NOTE — Telephone Encounter (Signed)
 The patient called back once again stating the forms need to be turned in by Monday. Please let her know it has been taken care of

## 2023-11-06 ENCOUNTER — Ambulatory Visit: Payer: Self-pay
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
## 2023-11-06 NOTE — Patient Instructions (Signed)
 Visit Information  Thank you for taking time to visit with me today. Please don't hesitate to contact me if I can be of assistance to you before our next scheduled appointment.  Our next appointment is by telephone on 12/05/23 at 1:00 PM Please call the care guide team at [phone] if you need to cancel or reschedule your appointment.   Following is a copy of your care plan:   Goals Addressed             This Visit's Progress    VBCI RN Care Plan       Problems:  Care Coordination needs related to Physical Activity Chronic Disease Management support and education needs related to Impaired functional mobility, balance, gait, and endurance secondary to chronic back pain   Goal: Over the next 30 days the Patient will continue to work with Medical illustrator and/or Social Worker to address care management and care coordination needs related to Impaired functional mobility, balance, gait, and endurance as evidenced by adherence to CM Team Scheduled appointments      Interventions:   Pain Interventions:  (Status:  New goal.) Short Term Goal Pain assessment performed Medications reviewed Reviewed provider established plan for pain management Discussed importance of adherence to all scheduled medical appointments Counseled on the importance of reporting any/all new or changed pain symptoms or management strategies to pain management provider Advised patient to report to care team affect of pain on daily activities Discussed use of relaxation techniques and/or diversional activities to assist with pain reduction (distraction, imagery, relaxation, massage, acupressure, TENS, heat, and cold application Reviewed with patient prescribed pharmacological and nonpharmacological pain relief strategies Assessed social determinant of health barriers  Patient Self-Care Activities:  Attend all scheduled provider appointments Call pharmacy for medication refills 3-7 days in advance of running out of  medications Call provider office for new concerns or questions  Take medications as prescribed   Work with the social worker to address care coordination needs and will continue to work with the clinical team to address health care and disease management related needs keep all doctor appointments report new symptoms to your doctor  Plan:  Telephone follow up appointment with care management team member scheduled for:  11/24/23 @11 :00 AM with Remigio Eisenmenger BSW The care management team will reach out to the patient again over the next 30 days.             Please call 1-800-273-TALK (toll free, 24 hour hotline) if you are experiencing a Mental Health or Behavioral Health Crisis or need someone to talk to.  Patient verbalizes understanding of instructions and care plan provided today and agrees to view in MyChart. Active MyChart status and patient understanding of how to access instructions and care plan via MyChart confirmed with patient.     Delsa Sale RN BSN CCM   [facility], [facility] Nurse Care Coordinator  Direct Dial: [phone] Website: [email or web address]

## 2023-11-09 DIAGNOSIS — M6281 Muscle weakness (generalized): Secondary | ICD-10-CM | POA: Diagnosis not present

## 2023-11-09 DIAGNOSIS — M79671 Pain in right foot: Secondary | ICD-10-CM | POA: Diagnosis not present

## 2023-11-09 DIAGNOSIS — M6701 Short Achilles tendon (acquired), right ankle: Secondary | ICD-10-CM | POA: Diagnosis not present

## 2023-11-09 DIAGNOSIS — M7741 Metatarsalgia, right foot: Secondary | ICD-10-CM | POA: Diagnosis not present

## 2023-11-09 DIAGNOSIS — Z7409 Other reduced mobility: Secondary | ICD-10-CM | POA: Diagnosis not present

## 2023-11-09 DIAGNOSIS — R293 Abnormal posture: Secondary | ICD-10-CM | POA: Diagnosis not present

## 2023-11-09 DIAGNOSIS — Z789 Other specified health status: Secondary | ICD-10-CM | POA: Diagnosis not present

## 2023-11-09 DIAGNOSIS — Z981 Arthrodesis status: Secondary | ICD-10-CM | POA: Diagnosis not present

## 2023-11-11 ENCOUNTER — Other Ambulatory Visit: Payer: Self-pay | Admitting: Family Medicine

## 2023-11-11 DIAGNOSIS — S0033XA Contusion of nose, initial encounter: Secondary | ICD-10-CM

## 2023-11-13 DIAGNOSIS — Z7409 Other reduced mobility: Secondary | ICD-10-CM | POA: Diagnosis not present

## 2023-11-13 DIAGNOSIS — Z981 Arthrodesis status: Secondary | ICD-10-CM | POA: Diagnosis not present

## 2023-11-13 DIAGNOSIS — M6701 Short Achilles tendon (acquired), right ankle: Secondary | ICD-10-CM | POA: Diagnosis not present

## 2023-11-13 DIAGNOSIS — M7741 Metatarsalgia, right foot: Secondary | ICD-10-CM | POA: Diagnosis not present

## 2023-11-13 DIAGNOSIS — M6281 Muscle weakness (generalized): Secondary | ICD-10-CM | POA: Diagnosis not present

## 2023-11-13 DIAGNOSIS — M79671 Pain in right foot: Secondary | ICD-10-CM | POA: Diagnosis not present

## 2023-11-13 DIAGNOSIS — Z789 Other specified health status: Secondary | ICD-10-CM | POA: Diagnosis not present

## 2023-11-13 DIAGNOSIS — R293 Abnormal posture: Secondary | ICD-10-CM | POA: Diagnosis not present

## 2023-11-14 ENCOUNTER — Encounter: Payer: Self-pay | Admitting: Family Medicine

## 2023-11-14 ENCOUNTER — Ambulatory Visit: Admitting: Family Medicine

## 2023-11-14 VITALS — BP 120/82 | HR 87 | Wt 109.4 lb

## 2023-11-14 DIAGNOSIS — M4325 Fusion of spine, thoracolumbar region: Secondary | ICD-10-CM | POA: Diagnosis not present

## 2023-11-14 DIAGNOSIS — G894 Chronic pain syndrome: Secondary | ICD-10-CM

## 2023-11-14 NOTE — Progress Notes (Signed)
   Subjective:    Patient ID: Deborah [name], female    DOB: Jul 18, 1956, 68 y.o.   MRN: [id number]  HPI She is here because of continued difficulty with her back.  She complains of a knot in the right upper back area.  She then complained of pain in various other spots as well as the fact that she has had multiple surgeries on her back.  Review of record indicates she has been to multiple places for her pain and apparently not been given any medications.  Review of the record indicates she has been to physical therapy as well as neurosurgery and is being cared for at the [facility] pain clinic.  Presently she is taking Suboxone.  She was seen at the [facility] pain clinic in early April and is scheduled to be seen there again in May 5 and again on the 12th as well as an appointment with neurosurgery on the 20th.   Review of Systems     Objective:    Physical Exam Alert and complaining of pain entire back area.  She does have a protuberant area to the right upper thoracic area.  Surgical scar is noted.  Is quite extensive.       Assessment & Plan:  Fusion of spine, thoracolumbar region  Chronic pain syndrome I explained that because she is being cared for at the [facility] pain clinic, it would not be appropriate for us  to give her any pain medications.  I strongly encouraged her to call their and if she has trouble getting to that appointment then she is needs to work an arrangement with [facility] for referral to someone closer to where she lives.

## 2023-11-24 ENCOUNTER — Ambulatory Visit: Payer: Self-pay | Admitting: Licensed Clinical Social Worker

## 2023-11-24 DIAGNOSIS — M7741 Metatarsalgia, right foot: Secondary | ICD-10-CM | POA: Diagnosis not present

## 2023-11-24 DIAGNOSIS — M6701 Short Achilles tendon (acquired), right ankle: Secondary | ICD-10-CM | POA: Diagnosis not present

## 2023-11-24 DIAGNOSIS — M79671 Pain in right foot: Secondary | ICD-10-CM | POA: Diagnosis not present

## 2023-11-24 DIAGNOSIS — Z7409 Other reduced mobility: Secondary | ICD-10-CM | POA: Diagnosis not present

## 2023-11-24 DIAGNOSIS — Z981 Arthrodesis status: Secondary | ICD-10-CM | POA: Diagnosis not present

## 2023-11-24 DIAGNOSIS — Z789 Other specified health status: Secondary | ICD-10-CM | POA: Diagnosis not present

## 2023-11-24 DIAGNOSIS — R293 Abnormal posture: Secondary | ICD-10-CM | POA: Diagnosis not present

## 2023-11-24 DIAGNOSIS — M6281 Muscle weakness (generalized): Secondary | ICD-10-CM | POA: Diagnosis not present
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
## 2023-11-24 NOTE — Patient Instructions (Signed)
 Visit Information  Thank you for taking time to visit with me today. Please don't hesitate to contact me if I can be of assistance to you before our next scheduled appointment.  Your next care management appointment is by telephone on 12/08/2023 at 11:00 am   Please call the care guide team at [phone] if you need to cancel, schedule, or reschedule an appointment.   Please call the Suicide and Crisis Lifeline: 988 go to [facility] Urgent [facility] [address] [place] [phone]) call 911 if you are experiencing a Mental Health or Behavioral Health Crisis or need someone to talk to.  Jonda Neighbours, PhD [facility], [facility] Social Worker Direct Dial: [phone]  Fax: [phone]

## 2023-11-29 ENCOUNTER — Telehealth: Payer: Self-pay | Admitting: Internal Medicine

## 2023-11-29 NOTE — Telephone Encounter (Signed)
 Pt stopped by the office with FL2 forms, she states these are her annual FL2 form from Social Services needed for her to continue to get the special assistance in home care she needs and being able to stay in her home.  Her Case Worker is Felicitas Horse t# [phone], forms are in your folder & she needs PCP to complete

## 2023-11-29 NOTE — Telephone Encounter (Signed)
 Copied from CRM [id number]. Topic: General - Other >> Nov 29, 2023  1:53 PM Rachelle R wrote: Reason for CRM: Patient calling looking for status of the fl2 Department [employer] Social [employer] form she requested to have filled out. States she can come pick it up from the office when it is ready.  Patient can be contacted at [phone] for more information.

## 2023-11-30 ENCOUNTER — Encounter: Payer: Self-pay | Admitting: Family Medicine

## 2023-11-30 ENCOUNTER — Telehealth: Payer: Self-pay | Admitting: Nurse Practitioner

## 2023-11-30 NOTE — Telephone Encounter (Signed)
 We checked the forms that were faxed & this form is different, it is a 1 page FL2 form

## 2023-11-30 NOTE — Telephone Encounter (Signed)
 FL-2 form completed. Please make a copy and print medication list to attach. Please call Ms. Thunberg to let her know the paperwork is ready.

## 2023-11-30 NOTE — Telephone Encounter (Signed)
 Per Dr. Robina Chol and Archibald Beard we will not be able to continue caring for this patient.  She will be dismissed.

## 2023-11-30 NOTE — Telephone Encounter (Signed)
 We checked the other forms that were faxed & this one is a different form, this is a 1 page FL2 form

## 2023-12-04 ENCOUNTER — Telehealth: Payer: Self-pay | Admitting: Nurse Practitioner

## 2023-12-04 DIAGNOSIS — M4150 Other secondary scoliosis, site unspecified: Secondary | ICD-10-CM | POA: Diagnosis not present

## 2023-12-04 DIAGNOSIS — F900 Attention-deficit hyperactivity disorder, predominantly inattentive type: Secondary | ICD-10-CM

## 2023-12-04 DIAGNOSIS — G894 Chronic pain syndrome: Secondary | ICD-10-CM | POA: Diagnosis not present

## 2023-12-04 DIAGNOSIS — Z981 Arthrodesis status: Secondary | ICD-10-CM | POA: Diagnosis not present

## 2023-12-04 DIAGNOSIS — T1490XS Injury, unspecified, sequela: Secondary | ICD-10-CM | POA: Diagnosis not present

## 2023-12-04 DIAGNOSIS — G8918 Other acute postprocedural pain: Secondary | ICD-10-CM | POA: Diagnosis not present

## 2023-12-04 DIAGNOSIS — M419 Scoliosis, unspecified: Secondary | ICD-10-CM | POA: Diagnosis not present

## 2023-12-04 NOTE — Telephone Encounter (Signed)
Pt would like a refill on her Adderall

## 2023-12-05 ENCOUNTER — Telehealth: Payer: Self-pay

## 2023-12-05 MED ORDER — AMPHETAMINE-DEXTROAMPHETAMINE 30 MG PO TABS: 30.0000 mg | ORAL_TABLET | Freq: Two times a day (BID) | ORAL | 0 refills | Status: AC

## 2023-12-05 MED ORDER — AMPHETAMINE-DEXTROAMPHETAMINE 30 MG PO TABS: 1.0000 | ORAL_TABLET | Freq: Two times a day (BID) | ORAL | 0 refills | Status: AC

## 2023-12-05 NOTE — Telephone Encounter (Signed)
 Refill for adderall sent for 3 months

## 2023-12-06 ENCOUNTER — Encounter: Payer: Self-pay | Admitting: Family

## 2023-12-06 ENCOUNTER — Ambulatory Visit (INDEPENDENT_AMBULATORY_CARE_PROVIDER_SITE_OTHER): Admitting: Family

## 2023-12-06 DIAGNOSIS — M79671 Pain in right foot: Secondary | ICD-10-CM

## 2023-12-06 DIAGNOSIS — M2021 Hallux rigidus, right foot: Secondary | ICD-10-CM

## 2023-12-06 NOTE — Progress Notes (Signed)
 Patient is seen today for surgical scheduling. Has not been able to connect with us  by phone. Will plan for right foot intervention 12/13/23. Bartholomew Light will call her with his exact time of surgery.

## 2023-12-07 ENCOUNTER — Telehealth: Payer: Self-pay

## 2023-12-07 ENCOUNTER — Telehealth: Payer: Self-pay | Admitting: Nurse Practitioner

## 2023-12-07 ENCOUNTER — Telehealth: Payer: Self-pay | Admitting: Family Medicine

## 2023-12-07 NOTE — Telephone Encounter (Signed)
 Refill for May, June, and July sent on 12/05/2023. May should be available at the pharmacy.   I am fine seeing her for her to receive the services needed until she can be seen by her new PCP.

## 2023-12-07 NOTE — Patient Outreach (Signed)
 Received a voice message from patient requesting a return call regarding her next upcoming nurse phone appointment. Placed successful outbound call to patient to inform her of the next upcoming scheduled telephone visit with this RN set for Wednesday, June 4 at 12 noon and patient verbalizes understanding.

## 2023-12-07 NOTE — Telephone Encounter (Signed)
 Please let her know she will need to request the last Adderall prescription closer to when this is due. It is not possible to send the August prescription this Gaylyn Berish. May, June, and July were just sent and no more than 3 months can be sent.

## 2023-12-07 NOTE — Telephone Encounter (Signed)
 Patient called and wants to know since we can see her if she can go ahead and still apply for her services.  I asked her was it about the [facility] form and she said not the first one but the second one that she had a meeting scheduled for those services.  I said yes continue on with the service meeting.  Pt wants refill of Adder[facility] [employer]

## 2023-12-07 NOTE — Telephone Encounter (Signed)
 Pt has found new doctor but can not see until 03/11/24  she will need Adderall rx, she has June and July already [facility]  can you send in her August 2025 rx

## 2023-12-07 NOTE — Telephone Encounter (Signed)
 FYI I do not think we need to do anything with his.  Copied from CRM [id number]. Topic: Appointments - Transfer of Care >> Dec 07, 2023 11:43 AM Donald Frost wrote: Pt is requesting to transfer FROM: [facility] Pt is requesting to transfer TO: [place] [facility] [place] Reason for requested transfer: She states she just needs a new provider It is the responsibility of the team the patient would like to transfer to (Dr. Waldo Guitar) to reach out to the patient if for any reason this transfer is not acceptable.   Patient states I need a new doctor and she says I need help with scoliosis. I asked her if she has seen a specialist for that and she said yes but was hard to talk with as she was a bit demanding and just wanted me to schedule a new pt appt with a new provider. Please assist patient further

## 2023-12-07 NOTE — Telephone Encounter (Signed)
 It appears this is for the location she is transferring to and possibly an FYI for us .

## 2023-12-08 ENCOUNTER — Other Ambulatory Visit: Payer: Self-pay | Admitting: Family Medicine

## 2023-12-08 ENCOUNTER — Other Ambulatory Visit: Payer: Self-pay | Admitting: Licensed Clinical Social Worker

## 2023-12-08 DIAGNOSIS — S0033XA Contusion of nose, initial encounter: Secondary | ICD-10-CM
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
## 2023-12-08 NOTE — Telephone Encounter (Signed)
 Called pt and advised her June and July adderal rx's already at pharmacy, we can not send in August refill now but call us  back closer to when refill is due

## 2023-12-08 NOTE — Patient Instructions (Signed)
 Visit Information  Thank you for taking time to visit with me today. Please don't hesitate to contact me if I can be of assistance to you before our next scheduled appointment.  Your next care management appointment is by telephone on 01/05/2024 at 11:00 am  Telephone follow up appointment date/time:  01/05/2024 at 11:00 am  Please call the care guide team at [phone] if you need to cancel, schedule, or reschedule an appointment.   Please call the Suicide and Crisis Lifeline: 988 go to [facility] Urgent [facility] [address], [place] [phone]) call 911 if you are experiencing a Mental Health or Behavioral Health Crisis or need someone to talk to.  Jonda Neighbours, PhD [facility], [facility] Social Worker Direct Dial: [phone]  Fax: [phone]

## 2023-12-12 ENCOUNTER — Encounter (HOSPITAL_COMMUNITY): Payer: Self-pay | Admitting: Orthopedic Surgery

## 2023-12-12 ENCOUNTER — Other Ambulatory Visit: Payer: Self-pay

## 2023-12-12 NOTE — Progress Notes (Signed)
 Patient was called to be informed that the surgery time for tomorrow was changed to 10:00 o'clock. Patient was instructed to be at the hospital at 07:30 o'clock and to stop clear liquids at 07:00 o'clock. Patient verbalized understanding.

## 2023-12-12 NOTE — Progress Notes (Signed)
 SDW call  Patient was given pre-op instructions over the phone. Patient verbalized understanding of instructions provided.     PCP - Archibald Beard, NP Cardiologist -  Pulmonary:    PPM/ICD - denies Device Orders - na Rep Notified - na   Chest x-ray - na EKG -  na Stress Test - ECHO -  Cardiac Cath -   Sleep Study/sleep apnea/CPAP: denies  Non-diabetic   Blood Thinner Instructions: denies Aspirin  Instructions:denies   ERAS Protcol - Clears until 0810   Anesthesia review: No   Patient denies shortness of breath, fever, cough and chest pain over the phone call  Your procedure is scheduled on Wednesday Dec 13, 2023  Report to [facility] Main Entrance "A" at 0840  A.M., then check in with the Admitting office.  Call this number if you have problems the morning of surgery:  [phone]   If you have any questions prior to your surgery date call [phone]: Open Monday-Friday 8am-4pm If you experience any cold or flu symptoms such as cough, fever, chills, shortness of breath, etc. between now and your scheduled surgery, please notify us  at the above number    Remember:  Do not eat after midnight the night before your surgery  You may drink clear liquids until 0810  the morning of your surgery.   Clear liquids allowed are: Water, Non-Citrus Juices (without pulp), Carbonated Beverages, Clear Tea, Black Coffee ONLY (NO MILK, CREAM OR POWDERED CREAMER of any kind), and Gatorade   Take these medicines the morning of surgery with A SIP OF WATER:  Suboxone  - check with prescriber for guidance.  Wellbutrin   As needed: Prilosec  As of today, STOP taking any Aspirin  (unless otherwise instructed by your surgeon) Aleve , Naproxen , Ibuprofen , Motrin , Advil , Goody's, BC's, all herbal medications, fish oil, and all vitamins.

## 2023-12-13 ENCOUNTER — Ambulatory Visit (HOSPITAL_BASED_OUTPATIENT_CLINIC_OR_DEPARTMENT_OTHER): Admitting: Anesthesiology

## 2023-12-13 ENCOUNTER — Ambulatory Visit (HOSPITAL_COMMUNITY): Admitting: Anesthesiology

## 2023-12-13 ENCOUNTER — Encounter (HOSPITAL_COMMUNITY)
Admission: RE | Disposition: A | Discharge status: Home / Self Care | Payer: Self-pay | Source: Home / Self Care | Attending: Orthopedic Surgery

## 2023-12-13 ENCOUNTER — Ambulatory Visit (HOSPITAL_COMMUNITY)
Admission: RE | Admit: 2023-12-13 | Discharge: 2023-12-13 | Disposition: A | Discharge status: Home / Self Care | Attending: Orthopedic Surgery | Admitting: Orthopedic Surgery

## 2023-12-13 ENCOUNTER — Encounter (HOSPITAL_COMMUNITY): Payer: Self-pay | Admitting: Orthopedic Surgery

## 2023-12-13 DIAGNOSIS — G8929 Other chronic pain: Secondary | ICD-10-CM | POA: Diagnosis not present

## 2023-12-13 DIAGNOSIS — K219 Gastro-esophageal reflux disease without esophagitis: Secondary | ICD-10-CM | POA: Diagnosis not present

## 2023-12-13 DIAGNOSIS — Z87891 Personal history of nicotine dependence: Secondary | ICD-10-CM | POA: Diagnosis not present

## 2023-12-13 DIAGNOSIS — M7741 Metatarsalgia, right foot: Secondary | ICD-10-CM | POA: Diagnosis not present

## 2023-12-13 DIAGNOSIS — M205X1 Other deformities of toe(s) (acquired), right foot: Secondary | ICD-10-CM

## 2023-12-13 DIAGNOSIS — G2581 Restless legs syndrome: Secondary | ICD-10-CM | POA: Insufficient documentation

## 2023-12-13 HISTORY — PX: WEIL OSTEOTOMY: SHX5044

## 2023-12-13 LAB — CBC
HCT: 38.9 % (ref 36.0–46.0)
Hemoglobin: 13 g/dL (ref 12.0–15.0)
MCH: 31 pg (ref 26.0–34.0)
MCHC: 33.4 g/dL (ref 30.0–36.0)
MCV: 92.6 fL (ref 80.0–100.0)
Platelets: 216 10*3/uL (ref 150–400)
RBC: 4.2 MIL/uL (ref 3.87–5.11)
RDW: 12.6 % (ref 11.5–15.5)
WBC: 6.9 10*3/uL (ref 4.0–10.5)
nRBC: 0 % (ref 0.0–0.2)

## 2023-12-13 SURGERY — OSTEOTOMY, WEIL
Anesthesia: General | Site: Foot | Laterality: Right

## 2023-12-13 MED ORDER — FENTANYL CITRATE (PF) 100 MCG/2ML IJ SOLN
INTRAMUSCULAR | Status: AC
  Filled 2023-12-13: qty 2

## 2023-12-13 MED ORDER — LIDOCAINE HCL 1 % IJ SOLN
INTRAMUSCULAR | Status: AC
  Filled 2023-12-13: qty 20

## 2023-12-13 MED ORDER — LACTATED RINGERS IV SOLN: INTRAVENOUS | Status: DC

## 2023-12-13 MED ORDER — LIDOCAINE 2% (20 MG/ML) 5 ML SYRINGE
INTRAMUSCULAR | Status: DC | PRN
  Administered 2023-12-13: 40 mg via INTRAVENOUS

## 2023-12-13 MED ORDER — HYDROMORPHONE HCL 1 MG/ML IJ SOLN
INTRAMUSCULAR | Status: AC
  Filled 2023-12-13: qty 1

## 2023-12-13 MED ORDER — VASHE WOUND IRRIGATION OPTIME
TOPICAL | Status: DC | PRN
  Administered 2023-12-13: 34 [oz_av]

## 2023-12-13 MED ORDER — ACETAMINOPHEN 10 MG/ML IV SOLN
1000.0000 mg | Freq: Once | INTRAVENOUS | Status: DC | PRN
  Administered 2023-12-13: 1000 mg via INTRAVENOUS

## 2023-12-13 MED ORDER — CEFAZOLIN SODIUM-DEXTROSE 2-4 GM/100ML-% IV SOLN
2.0000 g | INTRAVENOUS | Status: AC
  Administered 2023-12-13: 2 g via INTRAVENOUS
  Filled 2023-12-13: qty 100

## 2023-12-13 MED ORDER — ONDANSETRON HCL 4 MG/2ML IJ SOLN
INTRAMUSCULAR | Status: DC | PRN
  Administered 2023-12-13: 4 mg via INTRAVENOUS

## 2023-12-13 MED ORDER — PROPOFOL 10 MG/ML IV BOLUS
INTRAVENOUS | Status: DC | PRN
  Administered 2023-12-13: 30 mg via INTRAVENOUS
  Administered 2023-12-13: 150 mg via INTRAVENOUS

## 2023-12-13 MED ORDER — HYDROMORPHONE HCL 1 MG/ML IJ SOLN
INTRAMUSCULAR | Status: AC
  Filled 2023-12-13: qty 0.5

## 2023-12-13 MED ORDER — LIDOCAINE HCL (PF) 1 % IJ SOLN
INTRAMUSCULAR | Status: DC | PRN
  Administered 2023-12-13: 3 mL

## 2023-12-13 MED ORDER — CHLORHEXIDINE GLUCONATE 0.12 % MT SOLN
15.0000 mL | Freq: Once | OROMUCOSAL | Status: AC
  Administered 2023-12-13: 15 mL via OROMUCOSAL
  Filled 2023-12-13: qty 15

## 2023-12-13 MED ORDER — HYDROMORPHONE HCL 1 MG/ML IJ SOLN
0.2500 mg | INTRAMUSCULAR | Status: DC | PRN
  Administered 2023-12-13 (×2): 0.5 mg via INTRAVENOUS

## 2023-12-13 MED ORDER — FENTANYL CITRATE (PF) 250 MCG/5ML IJ SOLN
INTRAMUSCULAR | Status: AC
  Filled 2023-12-13: qty 5

## 2023-12-13 MED ORDER — 0.9 % SODIUM CHLORIDE (POUR BTL) OPTIME
TOPICAL | Status: DC | PRN
  Administered 2023-12-13: 1000 mL

## 2023-12-13 MED ORDER — HYDROMORPHONE HCL 1 MG/ML IJ SOLN
INTRAMUSCULAR | Status: DC | PRN
Start: 2023-12-13 — End: 2023-12-13
  Administered 2023-12-13 (×2): .5 mg via INTRAVENOUS

## 2023-12-13 MED ORDER — ORAL CARE MOUTH RINSE: 15.0000 mL | Freq: Once | OROMUCOSAL | Status: AC

## 2023-12-13 MED ORDER — OXYCODONE-ACETAMINOPHEN 10-325 MG PO TABS: 1.0000 | ORAL_TABLET | ORAL | 0 refills | Status: DC | PRN

## 2023-12-13 MED ORDER — PROPOFOL 500 MG/50ML IV EMUL
INTRAVENOUS | Status: DC | PRN
  Administered 2023-12-13: 150 ug/kg/min via INTRAVENOUS

## 2023-12-13 MED ORDER — FENTANYL CITRATE (PF) 250 MCG/5ML IJ SOLN
INTRAMUSCULAR | Status: DC | PRN
Start: 2023-12-13 — End: 2023-12-13
  Administered 2023-12-13 (×2): 50 ug via INTRAVENOUS
  Administered 2023-12-13: 100 ug via INTRAVENOUS
  Administered 2023-12-13: 50 ug via INTRAVENOUS

## 2023-12-13 MED ORDER — FENTANYL CITRATE (PF) 100 MCG/2ML IJ SOLN
25.0000 ug | INTRAMUSCULAR | Status: DC | PRN
  Administered 2023-12-13 (×3): 50 ug via INTRAVENOUS

## 2023-12-13 MED ORDER — LACTATED RINGERS IV SOLN
INTRAVENOUS | Status: DC | PRN
Start: 2023-12-13 — End: 2023-12-13

## 2023-12-13 MED ORDER — ACETAMINOPHEN 10 MG/ML IV SOLN
INTRAVENOUS | Status: AC
  Filled 2023-12-13: qty 100

## 2023-12-13 SURGICAL SUPPLY — 52 items
BAG COUNTER SPONGE SURGICOUNT (BAG) ×2 IMPLANT
BIT DRILL LONG 1.5 ZI (BIT) IMPLANT
BLADE AVERAGE 25X9 (BLADE) IMPLANT
BLADE MINI RND TIP GREEN BEAV (BLADE) IMPLANT
BLADE SAW SGTL 18.5X63.X.64 HD (BLADE) IMPLANT
BLADE SAW SGTL NAR THIN XSHT (BLADE) IMPLANT
BNDG COHESIVE 1X5 TAN STRL LF (GAUZE/BANDAGES/DRESSINGS) IMPLANT
BNDG COHESIVE 6X5 TAN NS LF (GAUZE/BANDAGES/DRESSINGS) IMPLANT
BNDG COHESIVE 6X5 TAN ST LF (GAUZE/BANDAGES/DRESSINGS) IMPLANT
BNDG ESMARK 4X9 LF (GAUZE/BANDAGES/DRESSINGS) ×2 IMPLANT
BNDG GAUZE DERMACEA FLUFF 4 (GAUZE/BANDAGES/DRESSINGS) IMPLANT
CLEANSER WND VASHE INSTL 34OZ (WOUND CARE) IMPLANT
CORD BIPOLAR FORCEPS 12FT (ELECTRODE) ×2 IMPLANT
COTTON STERILE ROLL (GAUZE/BANDAGES/DRESSINGS) IMPLANT
COVER SURGICAL LIGHT HANDLE (MISCELLANEOUS) ×4 IMPLANT
CUFF TOURN SGL QUICK 18X4 (TOURNIQUET CUFF) IMPLANT
CUFF TRNQT CYL 24X4X16.5-23 (TOURNIQUET CUFF) IMPLANT
DRAPE OEC MINIVIEW 54X84 (DRAPES) IMPLANT
DRAPE U-SHAPE 47X51 STRL (DRAPES) ×2 IMPLANT
DRIVER RETENTION T6 ZI (ORTHOPEDIC DISPOSABLE SUPPLIES) IMPLANT
DRSG ADAPTIC 3X8 NADH LF (GAUZE/BANDAGES/DRESSINGS) IMPLANT
DURAPREP 26ML APPLICATOR (WOUND CARE) ×2 IMPLANT
ELECTRODE REM PT RTRN 9FT ADLT (ELECTROSURGICAL) ×2 IMPLANT
GAUZE PAD ABD 8X10 STRL (GAUZE/BANDAGES/DRESSINGS) IMPLANT
GAUZE SPONGE 4X4 12PLY STRL (GAUZE/BANDAGES/DRESSINGS) IMPLANT
GLOVE BIOGEL PI IND STRL 9 (GLOVE) ×2 IMPLANT
GLOVE SURG ORTHO 9.0 STRL STRW (GLOVE) ×2 IMPLANT
GOWN STRL REUS W/ TWL XL LVL3 (GOWN DISPOSABLE) ×4 IMPLANT
KIT BASIN OR (CUSTOM PROCEDURE TRAY) ×2 IMPLANT
KIT TURNOVER KIT B (KITS) ×2 IMPLANT
MANIFOLD NEPTUNE II (INSTRUMENTS) ×2 IMPLANT
NDL HYPO 22X1.5 SAFETY MO (MISCELLANEOUS) IMPLANT
NDL HYPO 25GX1X1/2 BEV (NEEDLE) IMPLANT
NEEDLE HYPO 22X1.5 SAFETY MO (MISCELLANEOUS) ×1 IMPLANT
NEEDLE HYPO 25GX1X1/2 BEV (NEEDLE) IMPLANT
NS IRRIG 1000ML POUR BTL (IV SOLUTION) ×2 IMPLANT
PACK ORTHO EXTREMITY (CUSTOM PROCEDURE TRAY) ×2 IMPLANT
PAD ARMBOARD POSITIONER FOAM (MISCELLANEOUS) ×4 IMPLANT
PAD CAST 4YDX4 CTTN HI CHSV (CAST SUPPLIES) IMPLANT
SCREW NLOCK 2X10 (Screw) IMPLANT
SCREW NLOCK 2X12 (Screw) IMPLANT
SPECIMEN JAR SMALL (MISCELLANEOUS) ×2 IMPLANT
SUCTION TUBE FRAZIER 10FR DISP (SUCTIONS) IMPLANT
SUT ETHILON 2 0 FS 18 (SUTURE) IMPLANT
SUT ETHILON 2 0 PSLX (SUTURE) IMPLANT
SUT VIC AB 2-0 FS1 27 (SUTURE) IMPLANT
SYR 10ML LL (SYRINGE) IMPLANT
SYR CONTROL 10ML LL (SYRINGE) IMPLANT
TOWEL GREEN STERILE (TOWEL DISPOSABLE) ×2 IMPLANT
TOWEL GREEN STERILE FF (TOWEL DISPOSABLE) ×2 IMPLANT
TUBE CONNECTING 12X1/4 (SUCTIONS) IMPLANT
WATER STERILE IRR 1000ML POUR (IV SOLUTION) ×2 IMPLANT

## 2023-12-13 NOTE — Op Note (Signed)
 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 female with a diagnosis of Metatarsalgia Right Foot who failed conservative measures and elected for surgical management.    The risks benefits and alternatives were discussed with the patient preoperatively including but not limited to the risks of infection, bleeding, nerve injury, cardiopulmonary complications, the need for revision surgery, among others, and the patient was willing to proceed.  OPERATIVE IMPLANTS:   Implant Name Type Inv. Item Serial No. Manufacturer Lot No. LRB No. Used Action  SCREW NLOCK 2X12 - [id number] Screw SCREW NLOCK 2X12  [employer](ORTH,TRAU,BIO,SG)  Right 1 Implanted  SCREW NLOCK 2X10 - [id number] Screw SCREW NLOCK 2X10  [employer](ORTH,TRAU,BIO,SG)  Right 1 Implanted    @ENCIMAGES @  OPERATIVE FINDINGS: Improved alignment of the metatarsals with stable internal fixation.  OPERATIVE PROCEDURE: Patient was brought the operating room and underwent general anesthetic.  After adequate levels anesthesia obtained patient's right lower extremity was prepped using DuraPrep draped into a sterile field.  A dorsal incision was made in the webspace between the 2nd and 3rd toes.  Blunt dissection was carried down to the MTP joint of the second toe.  Retractors were placed and a oscillating saw was used to perform a Weil osteotomy.  Metatarsal head was translated proximally overhanging bone was resected and this was stabilized with a 2 x 12 mm mini frag screw.  Attention was then focused on the third metatarsal.  Retractors were placed oscillating saw was used to perform a Weil osteotomy metatarsal head was  translated proximally overhanging bone was resected and stabilized with a 2 x 10 mm mini frag screw.  The wound was irrigated with Vashe.  Electrocardio used to.  Tissue closed with 2-0 nylon.  Patient underwent a digital block with 10 cc of 1% lidocaine  plain.  Sterile dressing was applied patient was extubated taken the PACU in stable condition.   DISCHARGE PLANNING:  Antibiotic duration: Preoperative antibiotics  Weightbearing: Weightbearing on the heel as needed  Pain medication: Prescription for 10 mg Percocet  Dressing care/ Wound VAC: Dry dressing  Ambulatory devices: Crutches  Discharge to: Home.  Follow-up: In the office 1 week post operative.

## 2023-12-13 NOTE — Anesthesia Preprocedure Evaluation (Addendum)
 Anesthesia Evaluation  Patient identified by MRN, date of birth, ID band Patient awake    Reviewed: Allergy & Precautions, NPO status , Patient's Chart, lab work & pertinent test results  Airway Mallampati: III  TM Distance: >3 FB Neck ROM: Limited    Dental no notable dental hx.    Pulmonary former smoker   Pulmonary exam normal        Cardiovascular  Rhythm:Regular Rate:Normal     Neuro/Psych    Depression       GI/Hepatic Neg liver ROS, hiatal hernia,GERD  Medicated,,  Endo/Other  negative endocrine ROS    Renal/GU negative Renal ROS  negative genitourinary   Musculoskeletal  (+) Arthritis , Osteoarthritis,    Abdominal Normal abdominal exam  (+)   Peds  Hematology Lab Results      Component                Value               Date                      WBC                      6.9                 12/13/2023                HGB                      13.0                12/13/2023                HCT                      38.9                12/13/2023                MCV                      92.6                12/13/2023                PLT                      216                 12/13/2023              Anesthesia Other Findings   Reproductive/Obstetrics                             Anesthesia Physical Anesthesia Plan  ASA: 3  Anesthesia Plan: MAC   Post-op Pain Management:    Induction: Intravenous  PONV Risk Score and Plan: 3 and Ondansetron , Dexamethasone , Treatment may vary due to age or medical condition, Midazolam  and Propofol  infusion  Airway Management Planned: Simple Face Mask and Nasal Cannula  Additional Equipment: None  Intra-op Plan:   Post-operative Plan:   Informed Consent: I have reviewed the patients History and Physical, chart, labs and discussed the procedure including the risks, benefits and alternatives for the proposed anesthesia with the patient or  authorized representative who  has indicated his/her understanding and acceptance.     Dental advisory given  Plan Discussed with: CRNA  Anesthesia Plan Comments:        Anesthesia Quick Evaluation

## 2023-12-13 NOTE — Anesthesia Postprocedure Evaluation (Signed)
 Anesthesia Post Note  Patient: Deborah [name]  Procedure(s) Performed: RIGHT WEIL OSTEOTOMIES SECOND AND THIRT METATARSALS (Right: Foot)     Patient location during evaluation: PACU Anesthesia Type: General Level of consciousness: awake and alert Pain management: pain level controlled Vital Signs Assessment: post-procedure vital signs reviewed and stable Respiratory status: spontaneous breathing, nonlabored ventilation, respiratory function stable and patient connected to nasal cannula oxygen Cardiovascular status: blood pressure returned to baseline and stable Postop Assessment: no apparent nausea or vomiting Anesthetic complications: no   There were no known notable events for this encounter.  Last Vitals:  Vitals:   12/13/23 1115 12/13/23 1130  BP: (!) 172/112 133/78  Pulse: 80 75  Resp: 14 (!) 21  Temp:  36.5 C  SpO2: 96% 98%    Last Pain:  Vitals:   12/13/23 1130  TempSrc:   PainSc: 6                  Ivett Luebbe P Adamae Ricklefs

## 2023-12-13 NOTE — Progress Notes (Signed)
 Orthopedic Tech Progress Note Patient Details:  Deborah [name] 1955-11-22 [id number]  Ortho Devices Type of Ortho Device: Postop shoe/boot, Crutches Ortho Device/Splint Location: RLE Ortho Device/Splint Interventions: Application, Adjustment   Post Interventions Patient Tolerated: Well  Deborah [name] 12/13/2023, 11:18 AM

## 2023-12-13 NOTE — Transfer of Care (Signed)
 Immediate Anesthesia Transfer of Care Note  Patient: Deborah [name]  Procedure(s) Performed: RIGHT WEIL OSTEOTOMIES SECOND AND THIRT METATARSALS (Right: Foot)  Patient Location: PACU  Anesthesia Type:General  Level of Consciousness: awake, alert , and oriented  Airway & Oxygen Therapy: Patient Spontanous Breathing and Patient connected to nasal cannula oxygen  Post-op Assessment: Report given to RN and Post -op Vital signs reviewed and stable  Post vital signs: Reviewed and stable  Last Vitals:  Vitals Value Taken Time  BP 128/87 12/13/23 1049  Temp 97   Pulse 77 12/13/23 1053  Resp 13 12/13/23 1053  SpO2 99 % 12/13/23 1053  Vitals shown include unfiled device data.  Last Pain:  Vitals:   12/13/23 0826  TempSrc:   PainSc: 8       Patients Stated Pain Goal: 3 (12/13/23 0826)  Complications: No notable events documented.

## 2023-12-13 NOTE — Interval H&P Note (Signed)
 History and Physical Interval Note:  12/13/2023 9:08 AM  Deborah [name]  has presented today for surgery, with the diagnosis of Metatarsalgia Right Foot.  The various methods of treatment have been discussed with the patient and family. After consideration of risks, benefits and other options for treatment, the patient has consented to  Procedure(s) with comments: OSTEOTOMY, WEIL (Right) - WEIL OSTEOTOMY RIGHT 2ND AND 3RD METATARSAL as a surgical intervention.  The patient's history has been reviewed, patient examined, no change in status, stable for surgery.  I have reviewed the patient's chart and labs.  Questions were answered to the patient's satisfaction.     Drishti Pepperman V Jaydalee Bardwell
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
## 2023-12-13 NOTE — Anesthesia Procedure Notes (Signed)
 Procedure Name: LMA Insertion Date/Time: 12/13/2023 10:21 AM  Performed by: Loreda Rodriguez, CRNAPre-anesthesia Checklist: Patient identified, Emergency Drugs available, Suction available and Patient being monitored Patient Re-evaluated:Patient Re-evaluated prior to induction Oxygen Delivery Method: Circle System Utilized Preoxygenation: Pre-oxygenation with 100% oxygen Induction Type: IV induction Ventilation: Mask ventilation without difficulty LMA: LMA inserted LMA Size: 4.0 Number of attempts: 1 Airway Equipment and Method: Bite block Placement Confirmation: positive ETCO2 Tube secured with: Tape Dental Injury: Teeth and Oropharynx as per pre-operative assessment

## 2023-12-14 ENCOUNTER — Encounter (HOSPITAL_COMMUNITY): Payer: Self-pay | Admitting: Orthopedic Surgery

## 2023-12-20 ENCOUNTER — Ambulatory Visit (INDEPENDENT_AMBULATORY_CARE_PROVIDER_SITE_OTHER): Admitting: Family

## 2023-12-20 ENCOUNTER — Encounter: Payer: Self-pay | Admitting: Family

## 2023-12-20 DIAGNOSIS — M205X1 Other deformities of toe(s) (acquired), right foot: Secondary | ICD-10-CM

## 2023-12-20 MED ORDER — OXYCODONE-ACETAMINOPHEN 10-325 MG PO TABS: 1.0000 | ORAL_TABLET | ORAL | 0 refills | Status: DC | PRN
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
## 2023-12-21 ENCOUNTER — Telehealth: Payer: Self-pay | Admitting: Family

## 2023-12-21 NOTE — Telephone Encounter (Signed)
 Patient called and wants to know if she can start PT on the week of June 2. CB#[phone]

## 2023-12-22 NOTE — Telephone Encounter (Signed)
 I called pt and discuss therapy and advised lets have Dr. Julio Ohm eval at her appt on 01/01/2024. Stitches should come out that day and probably will have an xray and can advise what type of activity she can do at that appt. Pt voiced understanding and will call with any other questions.

## 2023-12-27 ENCOUNTER — Telehealth: Payer: Self-pay | Admitting: Nurse Practitioner

## 2023-12-27 NOTE — Telephone Encounter (Signed)
 Pt needs refill on her  Seroquil and buproprion  Sees new dr 8/11  Pt checking on FL 2 Form

## 2023-12-28 ENCOUNTER — Other Ambulatory Visit: Payer: Self-pay

## 2023-12-28 ENCOUNTER — Telehealth: Payer: Self-pay | Admitting: Orthopedic Surgery

## 2023-12-28 DIAGNOSIS — L719 Rosacea, unspecified: Secondary | ICD-10-CM

## 2023-12-28 MED ORDER — TRETINOIN 0.025 % EX GEL
Freq: Every day | CUTANEOUS | 1 refills | Status: AC
Start: 2023-12-28 — End: ?

## 2023-12-28 MED ORDER — BUPROPION HCL ER (XL) 300 MG PO TB24: 300.0000 mg | ORAL_TABLET | Freq: Every day | ORAL | 0 refills | Status: AC

## 2023-12-28 NOTE — Telephone Encounter (Signed)
 Pt s/p Weil osteotomy 2nd and 3rd toes. She is requesting refill on Oxycodone  10/325. Last refills were 12/20/2023 #30 and 12/13/2023 #30 and she also receives a monthly rx #30 from a pain management clinic in [place] Dr. Vikki Graves Dimon.

## 2023-12-28 NOTE — Telephone Encounter (Signed)
 Pt called requesting refill of oxycodone . Please send to pharmacy on file. Pt phone number is [phone].

## 2023-12-29 NOTE — Telephone Encounter (Signed)
I called and lm on vm to advise of message below. To call with questions.  

## 2024-01-01 ENCOUNTER — Ambulatory Visit: Admitting: Orthopedic Surgery

## 2024-01-01 ENCOUNTER — Other Ambulatory Visit (INDEPENDENT_AMBULATORY_CARE_PROVIDER_SITE_OTHER): Payer: Self-pay

## 2024-01-01 DIAGNOSIS — M205X1 Other deformities of toe(s) (acquired), right foot: Secondary | ICD-10-CM

## 2024-01-01 DIAGNOSIS — M79671 Pain in right foot: Secondary | ICD-10-CM

## 2024-01-01 MED ORDER — OXYCODONE-ACETAMINOPHEN 10-325 MG PO TABS: 1.0000 | ORAL_TABLET | ORAL | 0 refills | Status: AC | PRN

## 2024-01-03 ENCOUNTER — Ambulatory Visit: Payer: Medicare Other | Admitting: Nurse Practitioner

## 2024-01-03 ENCOUNTER — Other Ambulatory Visit: Payer: Self-pay

## 2024-01-03 DIAGNOSIS — G8929 Other chronic pain: Secondary | ICD-10-CM

## 2024-01-03 DIAGNOSIS — M5030 Other cervical disc degeneration, unspecified cervical region: Secondary | ICD-10-CM

## 2024-01-03 NOTE — Patient Instructions (Signed)
 Visit Information  Thank you for taking time to visit with me today. Please don't hesitate to contact me if I can be of assistance to you before our next scheduled appointment.  Our next appointment is by telephone on 02/08/24 at 2:15 PM Please call the care guide team at [phone] if you need to cancel or reschedule your appointment.   Following is a copy of your care plan:   Goals Addressed             This Visit's Progress    VBCI RN Care Plan Chronic Pain with Impaired Physical Mobility, balance, gait and postural changes   On track    Problems:  Care Coordination needs related to Physical Activity Chronic Disease Management support and education needs related to Chronic Pain Syndrome, Impaired functional mobility, balance, gait, with postural changes    Goal: Over the next 90 days the Patient will continue to work with RN Care Manager and/or Social Worker to address care management and care coordination needs related to Impaired functional mobility, balance, gait, and endurance as evidenced by adherence to CM Team Scheduled appointments      Interventions:   Pain Interventions:   Pain assessment performed Medications reviewed and medication reconciliation completed with no discrepancies noted  Reviewed provider established plan for pain management Counseled on the importance of reporting any/all new or changed pain symptoms or management strategies to pain management provider Advised patient to report to care team affect of pain on daily activities Reviewed with patient prescribed pharmacological and nonpharmacological pain relief strategies Screening for signs and symptoms of depression related to chronic disease state   Falls Interventions: Assessed for falls since last encounter, patient denies but is leaning towards the right due to postural changes  Assessed patients knowledge of fall risk prevention secondary to previously provided education Assessed for DME needs,  patient is using a cane for ambulation  Discussed patient completed her PT evaluation, she will start PT once recovering from toe surgery  Discussed plans with patient for ongoing nurse care management follow up and provided patient with direct contact information for nurse case management    Patient Self-Care Activities:  Attend all scheduled provider appointments Call pharmacy for medication refills 3-7 days in advance of running out of medications Call provider office for new concerns or questions  Take medications as prescribed   Work with the social worker to address care coordination needs and will continue to work with the clinical team to address health care and disease management related needs keep all doctor appointments report new symptoms to your doctor  Plan:  Follow up with provider re: post operative follow up with Dr.Duda scheduled for 01/30/24 at 1:00 PM  Telephone follow up appointment with care management team member scheduled for:  Friday, June 6 at 11:00 AM with Ami Kail BSW Telephone follow up appointment with care management team member scheduled for 01/16/24 at 12:30 PM with Jasmine Lewis LCSW  Telephone follow up appointment with care management team member scheduled for Thursday, July 10 at 2:15 PM with Louanne Roussel RN          VBCI RN Care Plan related to surgical wound to right foot, 2nd/3rd toes       Problems:  Chronic Disease Management support and education needs related to 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 with RIGHT WEIL OSTEOTOMIES SECOND AND THIRD METATARSALS Self Care Deficit   Goal: Over the next 90 days the Patient will continue to work with  RN Care Manager and/or Social Worker to address care management and care coordination needs related to 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 Reviewed medications with patient and discussed medication adherence Determined patient completed her initial post op follow up with sutures removal, she is able to shower, keeping surgical wound dressed with a gauze and wearing orthotic Assessed for wound healing and instructed patient on when to call her doctor if symptoms/concerns arise Reviewed scheduled/upcoming provider appointments including next scheduled follow up with Dr. Julio Ohm, Orthopedic surgeon on 01/30/24 at 1:00 PM Social Work referral for depression and counseling/resources  Discussed plans with patient for ongoing care management follow up and provided patient with direct contact information for care management team  Patient Self-Care Activities:  Attend all scheduled provider appointments Call pharmacy for medication refills 3-7 days in advance of running out of medications Call provider office for new concerns or questions  Take medications as prescribed   Work with the social worker to address care coordination needs and will continue to work with the clinical team to address health care and disease management related needs  Plan:   Follow up with provider re: post operative follow up with Dr.Duda scheduled for 01/30/24 at 1:00 PM  Telephone follow up appointment with care management team member scheduled for:  Friday, June 6 at 11:00 AM with Ami Kail BSW Telephone follow up appointment with care management team member scheduled for 01/16/24 at 12:30 PM with Alease Hunter LCSW  Telephone follow up appointment with care management team member scheduled for Thursday, July 10 at 2:15 PM with Louanne Roussel RN             Please call 1-800-273-TALK (toll free, 24 hour hotline) if  you are experiencing a Mental Health or Behavioral Health Crisis or need someone to talk to.  Patient verbalizes understanding of instructions and care plan provided today and agrees to view in MyChart. Active MyChart status and patient understanding of how to access instructions and care plan via MyChart confirmed with patient.     Louanne Roussel RN BSN CCM [place]  [facility], [facility] Nurse Care Coordinator  Direct Dial: [phone] Website: [email or web address]
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
## 2024-01-05 ENCOUNTER — Other Ambulatory Visit: Payer: Self-pay | Admitting: Licensed Clinical Social Worker

## 2024-01-07 ENCOUNTER — Encounter: Payer: Self-pay | Admitting: Orthopedic Surgery
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
## 2024-01-09 NOTE — Patient Outreach (Signed)
 Received a voice message from patient requesting resources to help pay her utilities. Sent referral to Care Guide to further assist.   Louanne Roussel RN BSN CCM [facility]  [facility], [facility] Nurse Care Coordinator  Direct Dial: [phone] Website: [email or web address]

## 2024-01-11 ENCOUNTER — Ambulatory Visit: Payer: Self-pay | Admitting: Student

## 2024-01-11 ENCOUNTER — Ambulatory Visit: Admitting: Family Medicine

## 2024-01-11 NOTE — Patient Instructions (Signed)
 Visit Information  Thank you for taking time to visit with me today. Please don't hesitate to contact me if I can be of assistance to you before our next scheduled appointment.  Your next care management appointment is by telephone on 01/29/2024 at 11:00 am   Please call the care guide team at [phone] if you need to cancel, schedule, or reschedule an appointment.   Please call the Suicide and Crisis Lifeline: 988 go to [facility] Urgent [facility] [address] [place] [phone]) call 911 if you are experiencing a Mental Health or Behavioral Health Crisis or need someone to talk to.  Jonda Neighbours, PhD [facility], [facility] Social Worker Direct Dial: [phone]  Fax: [phone]
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
## 2024-01-16 ENCOUNTER — Telehealth: Payer: Self-pay | Admitting: Licensed Clinical Social Worker

## 2024-01-17 ENCOUNTER — Telehealth: Payer: Self-pay | Admitting: Orthopedic Surgery

## 2024-01-17 NOTE — Telephone Encounter (Signed)
 Patient called and said she is in a lot of pain with her right foot and also her back is hurting now too. She stated that she can't make it to the pain management. CB#[phone]

## 2024-01-18 NOTE — Telephone Encounter (Signed)
 I called and lm on vm to advise that she had an appt with pain management on 01/16/2024 thi s week and cx appt. Dr. Julio Ohm was very clear at the last visit that the refill on 01/16/2024 Oxycodone  10/325 #30 would be the last refill and she is resch with pain management for early next month should defer to pain management on treatment plan. To call with any questions.

## 2024-01-19 ENCOUNTER — Telehealth: Payer: Self-pay | Admitting: Orthopedic Surgery

## 2024-01-19 NOTE — Telephone Encounter (Signed)
 I called pt and we were on the phone for 13 minutes. The pt states that she did not cancel her appt with the pain clinic that someone else did it. She then states that she thought the appt on 01/16/2024 which was a phone call visit was with a counselor from the hospital and so she cancelled it because she had to take care of her 67 year old mother. Then the pt said that she di dnot mean to cancel the appt that if she had known it was for pain management she would have kept it. I advised the pt that Dr. Julio Ohm said at her last visit that the rx she received would be the last one and the pt was very upset saying that society is at fault for recreational use of pain medication s and have ruined it for the people that really need it. The pt had also picked up an rx for Sumboxone on 01/05/2024 #90 and advised that she should not be taking the oxycodone  with that medication. The pt continued to talk about her back and her appts at [facility] and I asked if there was anything else that I could do for her. The pt wanted me to include this information in her chart and I advised that I would do this. To call with any questions.

## 2024-01-19 NOTE — Telephone Encounter (Signed)
 Pt request call back to discuss previous message

## 2024-01-24 ENCOUNTER — Telehealth: Payer: Self-pay | Admitting: Nurse Practitioner

## 2024-01-24 NOTE — Telephone Encounter (Signed)
 Copied from CRM [id number]. Topic: General - Other >> Jan 15, 2024 10:19 AM Bascom RAMAN wrote: Reason for CRM: Patient wants a callback regarding a form. Patient would not provide any other information. Callback number is [phone] >> Jan 24, 2024 11:21 AM Graeme ORN wrote: Patient called back. Request to have Keir Foland give her a call back. States she did speak with her yesterday but needs to speak with her again. Did not provide additional information. Thank You     01/24/24 Spoke to pt, states she has not heard back from case worker but she will let me know, advised her that she can have case worker call me directly* mh   >> Jan 23, 2024 10:54 AM Graeme ORN wrote: Patient called back. Request Erion Weightman. Connected to CAL.  >> Jan 23, 2024  8:40 AM Graeme ORN wrote: Patient called back. Request a call from [facility]. States FL2 form was supposed to be corrected but is still wrong. Thank You  >> Jan 15, 2024 11:19 AM Eleanor H wrote: Faxed form

## 2024-01-29 ENCOUNTER — Other Ambulatory Visit: Payer: Self-pay | Admitting: Licensed Clinical Social Worker

## 2024-01-30 ENCOUNTER — Telehealth: Payer: Self-pay | Admitting: Nurse Practitioner

## 2024-01-30 ENCOUNTER — Ambulatory Visit (INDEPENDENT_AMBULATORY_CARE_PROVIDER_SITE_OTHER): Admitting: Orthopedic Surgery

## 2024-01-30 DIAGNOSIS — M205X1 Other deformities of toe(s) (acquired), right foot: Secondary | ICD-10-CM

## 2024-01-30 DIAGNOSIS — M6701 Short Achilles tendon (acquired), right ankle: Secondary | ICD-10-CM

## 2024-01-30 DIAGNOSIS — M79671 Pain in right foot: Secondary | ICD-10-CM

## 2024-01-30 DIAGNOSIS — M7741 Metatarsalgia, right foot: Secondary | ICD-10-CM

## 2024-01-30 NOTE — Telephone Encounter (Signed)
 Spoke to pt FL2 form was emailed to [employer] .[employer]   Copied from [employer] [id number]. Topic: General - Other >> Jan 15, 2024 10:19 AM Bascom RAMAN wrote: Reason for CRM: Patient wants a callback regarding a form. Patient would not provide any other information. Callback number is [phone] >> Jan 30, 2024  9:04 AM Graeme ORN wrote: Patient called. Request callback from [facility]. Stated Ms Baldwin did not receive and email last week. She would like to know if it was sent. Thank You  >> Jan 24, 2024  1:40 PM Oswin Johal H wrote: Spoke to pt, states she has not heard back from case worker but she will let me know, advised her that she can have case worker call me directly >> Jan 24, 2024 11:21 AM Graeme ORN wrote: Patient called back. Request to have Jakyron Fabro give her a call back. States she did speak with her yesterday but needs to speak with her again. Did not provide additional information. Thank You  >> Jan 23, 2024 10:54 AM Graeme ORN wrote: Patient called back. Request Donte Lenzo. Connected to CAL.  >> Jan 23, 2024  8:40 AM Graeme ORN wrote: Patient called back. Request a call from [facility]. States FL2 form was supposed to be corrected but is still wrong. Thank You  >> Jan 15, 2024 11:19 AM Eleanor H wrote: Faxed

## 2024-01-30 NOTE — Patient Instructions (Signed)
 Visit Information  Thank you for taking time to visit with me today. Please don't hesitate to contact me if I can be of assistance to you before our next scheduled appointment.  Your next care management appointment is by telephone on 02/19/2024 at 10:00 am   Please call the care guide team at [phone] if you need to cancel, schedule, or reschedule an appointment.   Please call the Suicide and Crisis Lifeline: 988 go to [facility] Urgent [facility] [address], [place] [phone]) call 911 if you are experiencing a Mental Health or Behavioral Health Crisis or need someone to talk to. [name] [name], PhD [facility], [facility] Social Worker Direct Dial: [phone]  Fax: [phone]
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
## 2024-01-31 ENCOUNTER — Other Ambulatory Visit: Payer: Self-pay | Admitting: Licensed Clinical Social Worker

## 2024-01-31 ENCOUNTER — Encounter: Payer: Self-pay | Admitting: Orthopedic Surgery
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
## 2024-02-06 NOTE — Patient Instructions (Signed)
 Visit Information  Thank you for taking time to visit with me today. Please don't hesitate to contact me if I can be of assistance to you before our next scheduled appointment.  Our next appointment is by telephone on 07/30 at 11:30 AM Please call the care guide team at [phone] if you need to cancel or reschedule your appointment.   Following is a copy of your care plan:   Goals Addressed             This Visit's Progress    LCSW VBCI Social Work Care Plan   On track    Problems:   Disease Management support and education needs related to Depression: depressed mood  CSW Clinical Goal(s):   Over the next 90 days the Patient will attend all scheduled medical appointments as evidenced by patient report and care team review of appointment completion in electronic MEDICAL RECORD NUMBER  demonstrate a reduction in symptoms related to Depression: anxiety .  Interventions:  Mental Health:  Evaluation of current treatment plan related to Depression: anxiety Active listening / Reflection utilized Financial risk analyst / information provided Emotional Support Provided Motivational Interviewing employed Participation in counseling encourage : Patient reports interest in counseling with preference of female provider Provided general psycho-education for mental health needs Suicidal Ideation/Homicidal Ideation assessed: Denies  Patient Goals/Self-Care Activities:  Continue taking your medication as prescribed.   Increase coping skills, healthy habits, and stress reduction  Plan:   Telephone follow up appointment with care management team member scheduled for:  2-4 weeks        Please call the Suicide and Crisis Lifeline: 988 go to [facility] Urgent [facility] [address] [place] [phone]) call 911 if you are experiencing a Mental Health or Behavioral Health Crisis or need someone to talk to.  Patient verbalizes understanding of instructions  and care plan provided today and agrees to view in MyChart. Active MyChart status and patient understanding of how to access instructions and care plan via MyChart confirmed with patient.     Deborah [name] [facility] Health  [facility], [facility] Clinical Social Worker Direct Dial: [phone]  Fax: [phone] Website: [email or web address] 8:22 AM
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
## 2024-02-08 ENCOUNTER — Other Ambulatory Visit: Payer: Self-pay
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
## 2024-02-09 NOTE — Patient Instructions (Signed)
 Visit Information  Thank you for taking time to visit with me today. Please don't hesitate to contact me if I can be of assistance to you before our next scheduled appointment.  Your next care management appointment is by telephone on Monday, August 11 at 12:00 PM  Please call the care guide team at [phone] if you need to cancel, schedule, or reschedule an appointment.   Please call [phone]-TALK (toll free, 24 hour hotline) if you are experiencing a Mental Health or Behavioral Health Crisis or need someone to talk to.  Clayborne Ly RN BSN CCM [place]  [facility], [facility] Health Nurse Care Coordinator  Direct Dial: [phone] Website: [email or web address]

## 2024-02-13 ENCOUNTER — Telehealth: Payer: Self-pay | Admitting: Radiology

## 2024-02-13 NOTE — Telephone Encounter (Signed)
 Patient called triage stated she has had increased burning in her right foot since yesterday.  Trouble weight bearing due to burning sensation.  12/13/23 RT WEIL OSTEOTOMIES 2ND AND 3RD METATARSALS  Call back # [phone]

## 2024-02-13 NOTE — Telephone Encounter (Signed)
 SW pt, she says she will be here at 10:15

## 2024-02-14 ENCOUNTER — Encounter: Admitting: Family

## 2024-02-14 ENCOUNTER — Telehealth: Payer: Self-pay | Admitting: Family

## 2024-02-14 NOTE — Telephone Encounter (Signed)
 Pt called stating she can't make appt today and asking for another appt maybe tomorrow. Please call this post op appt at [phone].

## 2024-02-19 ENCOUNTER — Other Ambulatory Visit: Payer: Self-pay | Admitting: Licensed Clinical Social Worker

## 2024-02-20 NOTE — Patient Instructions (Signed)
 Visit Information  Thank you for taking time to visit with me today. Please don't hesitate to contact me if I can be of assistance to you before our next scheduled appointment.  Your next care management appointment is by telephone on 03/06/2024 at 10:00 am   Please call the care guide team at [phone] if you need to cancel, schedule, or reschedule an appointment.   Please call the Suicide and Crisis Lifeline: 988 go to [facility] Urgent [facility] [address] [place] [phone]) call 911 if you are experiencing a Mental Health or Behavioral Health Crisis or need someone to talk to.  [name] [name], PhD [facility], [facility] Social Worker Direct Dial: [phone]  Fax: [phone]
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
## 2024-02-23 NOTE — Telephone Encounter (Signed)
 Pt has an appt scheduled.

## 2024-02-28 ENCOUNTER — Telehealth: Payer: Self-pay | Admitting: Licensed Clinical Social Worker

## 2024-02-28 ENCOUNTER — Encounter: Payer: Self-pay | Admitting: Licensed Clinical Social Worker

## 2024-02-28 NOTE — Patient Instructions (Signed)
 Deborah [name] Parsley - I am sorry I was unable to reach you today for our scheduled appointment. I work with No primary care provider on file. and am calling to support your healthcare needs. Please contact me at [phone] at your earliest convenience. I look forward to speaking with you soon.   Thank you,  Rolin Kerns, LCSW [place]  [facility], [facility] Clinical Social Worker Direct Dial: [phone]  Fax: [phone] Website: [email or web address] 3:27 PM

## 2024-03-01 ENCOUNTER — Ambulatory Visit: Admitting: Family

## 2024-03-01 ENCOUNTER — Other Ambulatory Visit (INDEPENDENT_AMBULATORY_CARE_PROVIDER_SITE_OTHER): Payer: Self-pay

## 2024-03-01 DIAGNOSIS — M79671 Pain in right foot: Secondary | ICD-10-CM | POA: Diagnosis not present
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
## 2024-03-05 ENCOUNTER — Encounter: Payer: Self-pay | Admitting: Family

## 2024-03-06 ENCOUNTER — Other Ambulatory Visit: Payer: Self-pay | Admitting: Licensed Clinical Social Worker
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
## 2024-03-06 NOTE — Patient Instructions (Signed)
 Visit Information  Thank you for taking time to visit with me today. Please don't hesitate to contact me if I can be of assistance to you before our next scheduled appointment.  Your next care management appointment is by telephone on 03/27/2024 at 2:00 pm    Please call the care guide team at [phone] if you need to cancel, schedule, or reschedule an appointment.   Please call the Suicide and Crisis Lifeline: 988 go to [facility] Urgent [facility] [address], [place] [phone]) call 911 if you are experiencing a Mental Health or Behavioral Health Crisis or need someone to talk to.  Deborah [name] [name], PhD [facility], [facility] Social Worker Direct Dial: [phone]  Fax: [phone]

## 2024-03-11 ENCOUNTER — Telehealth: Payer: Self-pay

## 2024-03-11 ENCOUNTER — Encounter: Admitting: Family Medicine

## 2024-03-11 NOTE — Patient Outreach (Signed)
 Placed an outbound call to patient for nurse follow up. Patient requested to reschedule due to having another doctor's appointment. Discussed patient has a new [facility] PCP but may switch back to a [place] PCP and she would like to further discuss this at our next call. Explained to patient she will not be eligible for the [facility] Care Management service if her PCP is not a participating [facility] provider and she verbalizes understanding.   Cl[place]borne Ly RN BSN CCM [place]  [facility], [facility] Health Nurse Care Coordinator  Direct Dial: [phone] Website: [email or web address]

## 2024-03-20 ENCOUNTER — Telehealth

## 2024-03-20 NOTE — Patient Outreach (Signed)
 Unable to reach patient by phone today for a final nurse follow up. Closing patient from complex nurse care management due to patient transitioned to an [facility] PCP and is no longer eligible for the [facility] complex care management program. Sent in basket to [employer].   Clayborne Ly RN BSN CCM [place]  [facility], [facility] Health Nurse Care Coordinator  Direct Dial: [phone] Website: [email or web address]

## 2024-03-27 ENCOUNTER — Other Ambulatory Visit: Admitting: Licensed Clinical Social Worker

## 2024-03-28 ENCOUNTER — Other Ambulatory Visit: Payer: Self-pay | Admitting: Nurse Practitioner

## 2024-04-09 ENCOUNTER — Encounter (HOSPITAL_BASED_OUTPATIENT_CLINIC_OR_DEPARTMENT_OTHER): Payer: Self-pay | Admitting: Physical Therapy

## 2024-04-09 ENCOUNTER — Ambulatory Visit (HOSPITAL_BASED_OUTPATIENT_CLINIC_OR_DEPARTMENT_OTHER): Payer: Self-pay | Attending: Physician Assistant | Admitting: Physical Therapy

## 2024-04-09 ENCOUNTER — Other Ambulatory Visit: Payer: Self-pay

## 2024-04-09 DIAGNOSIS — M545 Low back pain, unspecified: Secondary | ICD-10-CM | POA: Diagnosis present

## 2024-04-09 DIAGNOSIS — R293 Abnormal posture: Secondary | ICD-10-CM | POA: Diagnosis present

## 2024-04-09 DIAGNOSIS — R2689 Other abnormalities of gait and mobility: Secondary | ICD-10-CM | POA: Insufficient documentation

## 2024-04-09 DIAGNOSIS — G8929 Other chronic pain: Secondary | ICD-10-CM | POA: Diagnosis present

## 2024-04-09 DIAGNOSIS — M5459 Other low back pain: Secondary | ICD-10-CM | POA: Diagnosis present

## 2024-04-09 DIAGNOSIS — M546 Pain in thoracic spine: Secondary | ICD-10-CM | POA: Insufficient documentation
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
## 2024-04-10 ENCOUNTER — Ambulatory Visit: Admitting: Family

## 2024-04-17 ENCOUNTER — Ambulatory Visit: Admitting: Family

## 2024-04-24 ENCOUNTER — Ambulatory Visit (INDEPENDENT_AMBULATORY_CARE_PROVIDER_SITE_OTHER): Admitting: Family

## 2024-04-24 DIAGNOSIS — M6701 Short Achilles tendon (acquired), right ankle: Secondary | ICD-10-CM

## 2024-04-24 DIAGNOSIS — M7741 Metatarsalgia, right foot: Secondary | ICD-10-CM

## 2024-04-24 DIAGNOSIS — M79671 Pain in right foot: Secondary | ICD-10-CM

## 2024-04-24 DIAGNOSIS — M205X1 Other deformities of toe(s) (acquired), right foot: Secondary | ICD-10-CM

## 2024-04-30 ENCOUNTER — Encounter: Payer: Self-pay | Admitting: Family
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
## 2024-05-03 ENCOUNTER — Ambulatory Visit (HOSPITAL_BASED_OUTPATIENT_CLINIC_OR_DEPARTMENT_OTHER): Attending: Physician Assistant | Admitting: Physical Therapy

## 2024-05-03 DIAGNOSIS — M5459 Other low back pain: Secondary | ICD-10-CM | POA: Diagnosis present

## 2024-05-03 DIAGNOSIS — M546 Pain in thoracic spine: Secondary | ICD-10-CM | POA: Insufficient documentation

## 2024-05-03 DIAGNOSIS — R2689 Other abnormalities of gait and mobility: Secondary | ICD-10-CM | POA: Diagnosis present

## 2024-05-03 DIAGNOSIS — R293 Abnormal posture: Secondary | ICD-10-CM | POA: Insufficient documentation
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
## 2024-05-05 ENCOUNTER — Encounter (HOSPITAL_BASED_OUTPATIENT_CLINIC_OR_DEPARTMENT_OTHER): Payer: Self-pay | Admitting: Physical Therapy

## 2024-05-07 ENCOUNTER — Ambulatory Visit: Admitting: Orthopedic Surgery

## 2024-05-07 DIAGNOSIS — M79671 Pain in right foot: Secondary | ICD-10-CM | POA: Diagnosis not present

## 2024-05-07 DIAGNOSIS — M7741 Metatarsalgia, right foot: Secondary | ICD-10-CM

## 2024-05-07 DIAGNOSIS — M205X1 Other deformities of toe(s) (acquired), right foot: Secondary | ICD-10-CM

## 2024-05-07 DIAGNOSIS — M6701 Short Achilles tendon (acquired), right ankle: Secondary | ICD-10-CM | POA: Diagnosis not present

## 2024-05-08 ENCOUNTER — Encounter: Payer: Self-pay | Admitting: Orthopedic Surgery
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
## 2024-05-09 ENCOUNTER — Ambulatory Visit (HOSPITAL_BASED_OUTPATIENT_CLINIC_OR_DEPARTMENT_OTHER): Admitting: Physical Therapy

## 2024-05-13 ENCOUNTER — Ambulatory Visit (HOSPITAL_BASED_OUTPATIENT_CLINIC_OR_DEPARTMENT_OTHER): Admitting: Physical Therapy

## 2024-05-13 DIAGNOSIS — R293 Abnormal posture: Secondary | ICD-10-CM | POA: Diagnosis not present

## 2024-05-13 DIAGNOSIS — M5459 Other low back pain: Secondary | ICD-10-CM

## 2024-05-13 DIAGNOSIS — M546 Pain in thoracic spine: Secondary | ICD-10-CM

## 2024-05-13 DIAGNOSIS — R2689 Other abnormalities of gait and mobility: Secondary | ICD-10-CM
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
## 2024-05-14 ENCOUNTER — Encounter (HOSPITAL_BASED_OUTPATIENT_CLINIC_OR_DEPARTMENT_OTHER): Payer: Self-pay | Admitting: Physical Therapy

## 2024-05-20 ENCOUNTER — Encounter (HOSPITAL_BASED_OUTPATIENT_CLINIC_OR_DEPARTMENT_OTHER): Payer: Self-pay

## 2024-05-20 ENCOUNTER — Ambulatory Visit (HOSPITAL_BASED_OUTPATIENT_CLINIC_OR_DEPARTMENT_OTHER): Admitting: Physical Therapy

## 2024-05-24 ENCOUNTER — Ambulatory Visit (HOSPITAL_BASED_OUTPATIENT_CLINIC_OR_DEPARTMENT_OTHER): Admitting: Physical Therapy

## 2024-05-24 ENCOUNTER — Encounter (HOSPITAL_BASED_OUTPATIENT_CLINIC_OR_DEPARTMENT_OTHER): Payer: Self-pay | Admitting: Physical Therapy

## 2024-05-24 DIAGNOSIS — M5459 Other low back pain: Secondary | ICD-10-CM

## 2024-05-24 DIAGNOSIS — R293 Abnormal posture: Secondary | ICD-10-CM

## 2024-05-24 DIAGNOSIS — M546 Pain in thoracic spine: Secondary | ICD-10-CM

## 2024-05-24 DIAGNOSIS — R2689 Other abnormalities of gait and mobility: Secondary | ICD-10-CM
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
## 2024-05-28 ENCOUNTER — Ambulatory Visit (HOSPITAL_BASED_OUTPATIENT_CLINIC_OR_DEPARTMENT_OTHER): Admitting: Physical Therapy

## 2024-05-31 ENCOUNTER — Encounter (HOSPITAL_BASED_OUTPATIENT_CLINIC_OR_DEPARTMENT_OTHER): Admitting: Physical Therapy

## 2024-06-03 ENCOUNTER — Encounter: Payer: Self-pay | Admitting: Radiology

## 2024-06-04 ENCOUNTER — Ambulatory Visit (HOSPITAL_BASED_OUTPATIENT_CLINIC_OR_DEPARTMENT_OTHER): Attending: Physician Assistant | Admitting: Physical Therapy

## 2024-06-04 DIAGNOSIS — R2689 Other abnormalities of gait and mobility: Secondary | ICD-10-CM | POA: Insufficient documentation

## 2024-06-04 DIAGNOSIS — R293 Abnormal posture: Secondary | ICD-10-CM | POA: Insufficient documentation

## 2024-06-04 DIAGNOSIS — M5459 Other low back pain: Secondary | ICD-10-CM | POA: Insufficient documentation

## 2024-06-04 DIAGNOSIS — M546 Pain in thoracic spine: Secondary | ICD-10-CM | POA: Insufficient documentation

## 2024-06-06 ENCOUNTER — Ambulatory Visit (HOSPITAL_BASED_OUTPATIENT_CLINIC_OR_DEPARTMENT_OTHER): Admitting: Physical Therapy

## 2024-06-19 ENCOUNTER — Emergency Department (HOSPITAL_BASED_OUTPATIENT_CLINIC_OR_DEPARTMENT_OTHER): Admission: EM | Admit: 2024-06-19 | Discharge: 2024-06-19 | Disposition: A | Discharge status: Home / Self Care

## 2024-06-19 ENCOUNTER — Emergency Department (HOSPITAL_BASED_OUTPATIENT_CLINIC_OR_DEPARTMENT_OTHER)

## 2024-06-19 ENCOUNTER — Other Ambulatory Visit: Payer: Self-pay

## 2024-06-19 ENCOUNTER — Emergency Department (HOSPITAL_BASED_OUTPATIENT_CLINIC_OR_DEPARTMENT_OTHER): Admitting: Radiology

## 2024-06-19 ENCOUNTER — Encounter (HOSPITAL_BASED_OUTPATIENT_CLINIC_OR_DEPARTMENT_OTHER): Payer: Self-pay | Admitting: *Deleted

## 2024-06-19 DIAGNOSIS — S300XXA Contusion of lower back and pelvis, initial encounter: Secondary | ICD-10-CM | POA: Diagnosis not present

## 2024-06-19 DIAGNOSIS — W19XXXA Unspecified fall, initial encounter: Secondary | ICD-10-CM | POA: Insufficient documentation

## 2024-06-19 DIAGNOSIS — G8929 Other chronic pain: Secondary | ICD-10-CM | POA: Insufficient documentation

## 2024-06-19 DIAGNOSIS — S3992XA Unspecified injury of lower back, initial encounter: Secondary | ICD-10-CM | POA: Diagnosis present

## 2024-06-19 MED ORDER — LIDOCAINE 5 % EX PTCH
1.0000 | MEDICATED_PATCH | CUTANEOUS | Status: DC
  Filled 2024-06-19: qty 1

## 2024-06-19 MED ORDER — CYCLOBENZAPRINE HCL 10 MG PO TABS: 10.0000 mg | ORAL_TABLET | Freq: Two times a day (BID) | ORAL | 0 refills | Status: AC | PRN

## 2024-06-19 MED ORDER — LIDOCAINE 5 % EX PTCH: 1.0000 | MEDICATED_PATCH | CUTANEOUS | 0 refills | Status: AC

## 2024-06-19 MED ORDER — ACETAMINOPHEN 500 MG PO TABS
1000.0000 mg | ORAL_TABLET | Freq: Once | ORAL | Status: AC
  Administered 2024-06-19: 1000 mg via ORAL
  Filled 2024-06-19: qty 2

## 2024-06-19 NOTE — ED Notes (Signed)
 Attempted to medicate patient with lidocaine  patch. Patient told this RN get the fuck out, I need medicine that actually works. Dr. Simon notified and spoke with patient again regarding pain control. Patient left the department without discharge paperwork.

## 2024-06-19 NOTE — ED Triage Notes (Addendum)
 Pt states that she is here due to fall a few days ago.  Pt states that she has scoliosis and her balance is not always good.  She states that she tripped and fell on her left buttock.  Pt states that she fell on her left buttock x2 and she has pain when moving and sitting.  Pt also stuck her head, no blood thinners no LOC.  Right shoulder pain.

## 2024-06-19 NOTE — ED Provider Notes (Signed)
 [place] EMERGENCY DEPARTMENT AT [facility] Provider Note   CSN: [id number] Arrival date & time: 06/19/24  1748     Patient presents with: Deborah [name] Deborah [name] is a 68 y.o. female.    Fall Pertinent negatives include no chest pain, no abdominal pain and no shortness of breath.   Patient presents because of fall.  Fell approximately 2 days ago.  Landed on her buttocks.  Mostly endorsing left-sided buttocks pain.  Has been walking with the help with a cane but she does this occasionally at baseline anyway.  History of lumbar fusion in the past.  Patient states that she just lost her balance.  No bowel or bladder incontinence.  No saddle anesthesia.  Endorses pain and bruising to the left buttocks area.  No anticoagulation use.  She is not clear whether or not she hit her head.  No syncope.  Patient states that she just lost her balance        Prior to Admission medications   Medication Sig Start Date End Date Taking? Authorizing Provider  cyclobenzaprine (FLEXERIL) 10 MG tablet Take 1 tablet (10 mg total) by mouth 2 (two) times daily as needed for muscle spasms. 06/19/24  Yes Simon Lavonia SAILOR, MD  lidocaine  (LIDODERM ) 5 % Place 1 patch onto the skin daily. Remove & Discard patch within 12 hours or as directed by MD 06/19/24  Yes Simon Lavonia SAILOR, MD  amphetamine -dextroamphetamine  (ADDERALL) 30 MG tablet Take 1 tablet by mouth 2 (two) times daily. Script 1 of 3 12/05/23   Early, Sara E, NP  amphetamine -dextroamphetamine  (ADDERALL) 30 MG tablet Take 1 tablet by mouth 2 (two) times daily. Script 3 of 3 01/30/24   Early, Sara E, NP  amphetamine -dextroamphetamine  (ADDERALL) 30 MG tablet Take 1 tablet by mouth 2 (two) times daily. Script 2 of 3 01/02/24   Early, Sara E, NP  bimatoprost  (LATISSE ) 0.03 % ophthalmic solution Place one drop on applicator and apply evenly along the skin of the upper eyelid at base of eyelashes once daily at bedtime; repeat procedure for second eye (use a  clean applicator). 06/16/23   Early, Sara E, NP  buprenorphine -naloxone  (SUBOXONE ) 8-2 mg SUBL SL tablet Place 1 tablet under the tongue 3 (three) times daily. 11/09/21   [provider]  buPROPion  (WELLBUTRIN  XL) 300 MG 24 hr tablet Take 1 tablet (300 mg total) by mouth daily. 12/28/23   Early, Sara E, NP  CALCIUM PO Take 1 tablet by mouth in the morning.    [provider]  Multiple Vitamin (MULTIVITAMIN) capsule Take 1 capsule by mouth in the morning.    [provider]  omeprazole  (PRILOSEC) 40 MG capsule Take 1 capsule (40 mg total) by mouth 2 (two) times daily. Take in the morning and at dinnertime. 08/14/23   Early, Sara E, NP  oxyCODONE -acetaminophen  (PERCOCET) 10-325 MG tablet Take 1 tablet by mouth every 4 (four) hours as needed. 01/01/24   Harden Jerona GAILS, MD  Probiotic Product (PROBIOTIC PO) Take 1 capsule by mouth daily.    [provider]  QUEtiapine  (SEROQUEL ) 25 MG tablet Take 1-2 tablets (25-50 mg total) by mouth at bedtime. 06/16/23   Early, Sara E, NP  tretinoin  (RETIN-A ) 0.025 % gel Apply topically at bedtime. 12/28/23   Early, Sara E, NP  tretinoin  microspheres (RETIN-A  MICRO) 0.1 % gel Apply topically at bedtime. Patient taking differently: Apply 1 Application topically 4 (four) times a week. 06/16/23   Early, Sara E, NP  Vitamin D , Ergocalciferol , (DRISDOL ) 1.25 MG (50000 UNIT) CAPS capsule Take 1 capsule (50,000 Units total) by mouth every 7 (seven) days. 09/14/23   Oris Camie BRAVO, NP    Allergies: Voltaren  [diclofenac ], Erythromycin, and Vancomycin     Review of Systems  Constitutional:  Negative for chills and fever.  HENT:  Negative for ear pain and sore throat.   Eyes:  Negative for pain and visual disturbance.  Respiratory:  Negative for cough and shortness of breath.   Cardiovascular:  Negative for chest pain and palpitations.  Gastrointestinal:  Negative for abdominal pain and vomiting.  Genitourinary:  Negative for dysuria and  hematuria.  Musculoskeletal:  Negative for arthralgias and back pain.  Skin:  Negative for color change and rash.  Neurological:  Negative for seizures and syncope.  All other systems reviewed and are negative.   Updated Vital Signs BP 132/77   Pulse 74   Temp 97.7 F (36.5 C) (Oral)   Resp 16   SpO2 95%   Physical Exam Vitals and nursing note reviewed.  Constitutional:      General: She is not in acute distress.    Appearance: She is well-developed.  HENT:     Head: Normocephalic and atraumatic.  Eyes:     Conjunctiva/sclera: Conjunctivae normal.  Cardiovascular:     Rate and Rhythm: Normal rate and regular rhythm.     Heart sounds: No murmur heard. Pulmonary:     Effort: Pulmonary effort is normal. No respiratory distress.     Breath sounds: Normal breath sounds.  Abdominal:     Palpations: Abdomen is soft.     Tenderness: There is no abdominal tenderness.  Musculoskeletal:        General: No swelling.     Cervical back: Neck supple.       Legs:  Skin:    General: Skin is warm and dry.     Capillary Refill: Capillary refill takes less than 2 seconds.  Neurological:     Mental Status: She is alert.  Psychiatric:        Mood and Affect: Mood normal.     (all labs ordered are listed, but only abnormal results are displayed) Labs Reviewed - No data to display  EKG: None  Radiology: CT PELVIS WO CONTRAST Result Date: 06/19/2024 EXAM: CT PELVIS, WITHOUT IV CONTRAST 06/19/2024 10:03:11 PM TECHNIQUE: Axial images were acquired through the pelvis without IV contrast. Reformatted images were reviewed. Automated exposure control, iterative reconstruction, and/or weight based adjustment of the mA/kV was utilized to reduce the radiation dose to as low as reasonably achievable. COMPARISON: None available. CLINICAL HISTORY: possible occult fracture greater trochanter FINDINGS: BONES: Marked lumbar levoscoliosis is partially visualized. Lumbosacral fusion with  instrumentation with bilateral iliac screws is partially visualized. Remote healed fracture deformity of the left greater trochanter. No acute fracture or focal osseous lesion. JOINTS: No dislocation. Small bilateral degenerative hip arthritis. SOFT TISSUES: Vascular calcifications noted. INTRAPELVIC CONTENTS: Moderate-to-large volume of stool within the pelvis. Prior small bowel resection with anastomotic staple line noted within the left lower quadrant. IMPRESSION: 1. No acute osseous abnormality. 2. Remote healed fracture deformity of the left greater trochanter. 3. Mild bilateral hip osteoarthritis. 4. Moderate-to-large stool burden in the pelvis, correlate for constipation clinically. Electronically signed by: Dorethia Molt MD 06/19/2024 10:27 PM EST RP Workstation: [id number]   DG Shoulder Right Result Date: 06/19/2024 EXAM: 1 VIEW(S) XRAY OF THE SHOULDER 06/19/2024 07:22:00 PM COMPARISON: None available. CLINICAL HISTORY: fall, pain in left buttock FINDINGS:  BONES AND JOINTS: Glenohumeral joint is normally aligned. Mild degenerative changes of the glenohumeral joint. No acute fracture or dislocation. [facility] joint is unremarkable in appearance. Partially seen thoracic spinal fixation hardware in place. SOFT TISSUES: No abnormal calcifications. Visualized lung is unremarkable. IMPRESSION: 1. No acute findings. Electronically signed by: Morgane Naveau MD 06/19/2024 08:02 PM EST RP Workstation: [id number]   DG Hip Unilat W or Wo Pelvis 2-3 Views Left Result Date: 06/19/2024 EXAM: 2 or more VIEW(S) XRAY OF THE LEFT HIP 06/19/2024 07:22:00 PM COMPARISON: CT abdomen and pelvis 04/08/2022. CT abdomen and pelvis 02/02/2017. CLINICAL HISTORY: fall, pain in left buttock FINDINGS: BONES AND JOINTS: Slightly more conspicuous irregular ossification along the left greater trochanter. If clinically indicated, consider outpatient MRI for further evaluation. The hip joint is maintained. No significant degenerative  changes. LUMBAR SPINE: Postoperative changes with significant posterior fixation lumbar spine and sacrum. SOFT TISSUES: The soft tissues are unremarkable. IMPRESSION: 1. No acute findings. 2. Slightly more conspicuous irregular ossification along the left greater trochanter; if needed, consider outpatient MRI for further evaluation. Electronically signed by: Morgane Naveau MD 06/19/2024 07:57 PM EST RP Workstation: [id number]   CT Head Wo Contrast Result Date: 06/19/2024 EXAM: CT HEAD WITHOUT CONTRAST 06/19/2024 07:03:55 PM TECHNIQUE: CT of the head was performed without the administration of intravenous contrast. Automated exposure control, iterative reconstruction, and/or weight based adjustment of the mA/kV was utilized to reduce the radiation dose to as low as reasonably achievable. COMPARISON: 08/03/2023 CLINICAL HISTORY: Polytrauma, blunt FINDINGS: BRAIN AND VENTRICLES: No acute hemorrhage. No evidence of acute infarct. No hydrocephalus. No extra-axial collection. No mass effect or midline shift. Patchy and confluent decreased attenuation throughout deep and periventricular white matter bilaterally, compatible with chronic microvascular ischemic disease. Stable bilateral frontal lobe encephalomalacia. ORBITS: No acute abnormality. SINUSES: No acute abnormality. SOFT TISSUES AND SKULL: No acute soft tissue abnormality. Chronic nondisplaced occipital bone fracture. Similar chronic deformity of the left nasal bone. IMPRESSION: 1. No acute intracranial abnormality. 2. Moderate chronic microvascular ischemic disease. 3. Stable bilateral frontal lobe encephalomalacia. 4. Chronic nondisplaced occipital bone fracture. 5. Chronic deformity of the left nasal bone. Electronically signed by: Donnice Mania MD 06/19/2024 07:37 PM EST RP Workstation: [id number]     Procedures   Medications Ordered in the ED  lidocaine  (LIDODERM ) 5 % 1 patch (1 patch Transdermal Patient Refused/Not Given 06/19/24 2321)   acetaminophen  (TYLENOL ) tablet 1,000 mg (1,000 mg Oral Given 06/19/24 2223)                                    Medical Decision Making Amount and/or Complexity of Data Reviewed Radiology: ordered.  Risk OTC drugs. Prescription drug management.     HPI:    Patient presents because of fall.  Fell approximately 2 days ago.  Landed on her buttocks.  Mostly endorsing left-sided buttocks pain.  Has been walking with the help with a cane but she does this occasionally at baseline anyway.  History of lumbar fusion in the past.  Patient states that she just lost her balance.  No bowel or bladder incontinence.  No saddle anesthesia.  Endorses pain and bruising to the left buttocks area.  No anticoagulation use.  She is not clear whether or not she hit her head.  No syncope.  Patient states that she just lost her balance   MDM:   Upon exam, patient hemodynamically stable.  ANO x 3 GCS  15.  No chest pain or shortness of breath.  No abdominal pain.  No nausea vomit diarrhea.  Patient already had imaging completed out in triage.  X-ray of the right shoulder was unremarkable.  CT head benign.  Did obtain pelvic x-ray did show a possible occult fracture of the left greater trochanter area.  She does have some bruising over the left buttocks area.  Therefore, will obtain CT pelvis without contrast to assess for any kind obvious fracture to this area.  She is neurovascular intact distally.  No concerns.  Neurovascular injury.  No syncope.  No cardiogenic syncope concerns.  Note indication obtain laboratory workup at this time.  Purely mechanical fall.   Reevaluation:   Upon reexamination, patient hemodynamically stable.  Remains A&O x 3 with GCS 15.  CT imaging shows no fracture.  Reviewed patient's chart.  Recently follow-up with PCP about the same complaint.  Asked for oxycodone  at this point time.  PCP recommended patient follow-up with her pain management doctor.  Patient currently taking  Suboxone .  Explained to the patient that I would not be prescribing oxycodone  from the ED given there is no obvious fracture and that this is something that is been bothering her for some time.  Explained that I cannot give her oxycodone  in setting of her taking Suboxone  as well given that this could cause withdrawals, break her pain contract.  I do not see any reason to stop the Suboxone  and start oxycodone  from the ED. this pain seems to be more chronic in nature.  She became very upset and tearful.  Patient then used derogatory language towards myself and other staff.  She was able to walk her self out with the help of a cane without difficulty.  Stable on her feet.  No concerns for further fall.  Recommend patient follow-up with her pain management doctor for any ongoing chronic pain.     I have independently interpreted the XR  and CT  images and agree with the radiologist finding   Social Determinant of Health: chronic pain    Disposition and Follow Up: pcp, pain mgt       Final diagnoses:  Fall, initial encounter  Other chronic pain  Hematoma of buttock    ED Discharge Orders          Ordered    lidocaine  (LIDODERM ) 5 %  Every 24 hours        06/19/24 2300    cyclobenzaprine (FLEXERIL) 10 MG tablet  2 times daily PRN        06/19/24 2300               Simon Lavonia SAILOR, MD 06/19/24 2345

## 2024-06-19 NOTE — Discharge Instructions (Addendum)
 For pain, you can take 1000 mg of Tylenol  or 1 g of Tylenol  every 6-8 hours.  Do not exceed more than 4000 mg or 4 g in a 24-hour period.    Please apply the lidocaine  patches as well. I have prescribed flexeril  as well.   There is no evidence of acute fracture so we will not prescribe opioids at this time. This needs to be addressed by your chronic pain doctors.

## 2024-06-29 ENCOUNTER — Ambulatory Visit (HOSPITAL_BASED_OUTPATIENT_CLINIC_OR_DEPARTMENT_OTHER): Admitting: Physical Therapy

## 2024-07-01 ENCOUNTER — Telehealth: Payer: Self-pay | Admitting: Family

## 2024-07-01 NOTE — Telephone Encounter (Signed)
 Pt called and wanted to know where did you send her for pain management. CB#[phone]

## 2024-07-05 NOTE — Telephone Encounter (Signed)
 I SW pt, she will call [facility]pain clinic, we referred her there back in June. Per Dr. Crist last OV note, that she should contact her PCP for comprehensive pain management. She does have an appt with us  next week.

## 2024-07-09 ENCOUNTER — Ambulatory Visit: Admitting: Orthopedic Surgery

## 2024-07-10 ENCOUNTER — Encounter (HOSPITAL_BASED_OUTPATIENT_CLINIC_OR_DEPARTMENT_OTHER): Admitting: Physical Therapy

## 2024-08-13 ENCOUNTER — Telehealth: Payer: Self-pay | Admitting: Orthopedic Surgery

## 2024-08-13 NOTE — Telephone Encounter (Signed)
 Pt called stating a referral for pain management was sent for her but I see it pt last appt was 10/25. Pt asking for new referral for pain management. Please call pt with this info at [phone].

## 2024-08-17 ENCOUNTER — Ambulatory Visit (HOSPITAL_COMMUNITY)

## 2024-08-17 ENCOUNTER — Encounter (HOSPITAL_COMMUNITY): Payer: Self-pay | Admitting: *Deleted

## 2024-08-17 ENCOUNTER — Ambulatory Visit (HOSPITAL_COMMUNITY)
Admission: EM | Admit: 2024-08-17 | Discharge: 2024-08-17 | Disposition: A | Discharge status: Home / Self Care | Attending: Emergency Medicine | Admitting: Emergency Medicine

## 2024-08-17 DIAGNOSIS — S298XXA Other specified injuries of thorax, initial encounter: Secondary | ICD-10-CM

## 2024-08-17 DIAGNOSIS — G894 Chronic pain syndrome: Secondary | ICD-10-CM

## 2024-08-17 DIAGNOSIS — M546 Pain in thoracic spine: Secondary | ICD-10-CM

## 2024-08-17 DIAGNOSIS — M4325 Fusion of spine, thoracolumbar region: Secondary | ICD-10-CM | POA: Diagnosis not present

## 2024-08-17 NOTE — Discharge Instructions (Signed)
 X-rays did not reveal any displaced fractures of the ribs or acute bony abnormalities.  You can use heat or ice to the area in addition to taking 650 mg of Tylenol  every 6-8 hours as needed.  Follow-up with pain management for any pain concerns.  Seek immediate care at the nearest emergency department for any new concerning symptoms.

## 2024-08-17 NOTE — ED Triage Notes (Signed)
 Pt states she fell yesterday and hit her upper back on the floor and tub at home. She taking her regular meds for the pain. She denies hitting her head.
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
## 2024-08-17 NOTE — ED Notes (Addendum)
 Pt sister brought her suboxone  since she has not taken it today, pt took it in office

## 2024-08-19 ENCOUNTER — Other Ambulatory Visit: Payer: Self-pay

## 2024-08-19 DIAGNOSIS — M79671 Pain in right foot: Secondary | ICD-10-CM

## 2024-08-19 NOTE — Telephone Encounter (Signed)
 Referred patient to [facility] which she has used in the past.

## 2024-08-27 ENCOUNTER — Telehealth: Payer: Self-pay | Admitting: Orthopedic Surgery

## 2024-08-27 ENCOUNTER — Ambulatory Visit: Admitting: Orthopedic Surgery

## 2024-08-27 NOTE — Telephone Encounter (Signed)
 Pt request something for pain, rescheduled her appt form today to 09/05/24

## 2024-08-27 NOTE — Telephone Encounter (Signed)
 I called and sw pt and advised that we can not give anything for pain. She is in pain management with Nat Gavel PA in [place]. The pt said tht she is not receiving pain medication there. Advised she had rx for  Buprenorphine -Nalox 8-2 Mg Tab #90 on 08/21/2024. She states that this is for her back not her foot pain. Pt was angry and siad that her appt next week is a wasted trip as she is in pain and always in pain since her surgery. I advised the pt that Dr. Harden is happy to see her in the office but he does not give [facility]anger term pain medication. The pt said that this is stupid and that the government has made it so hard for people to get the medication that really needs it Pt said she will come in for her appt but it will be  another wasted trip

## 2024-09-05 ENCOUNTER — Ambulatory Visit: Admitting: Orthopedic Surgery

## 2024-09-05 ENCOUNTER — Other Ambulatory Visit: Payer: Self-pay

## 2024-09-05 ENCOUNTER — Encounter: Payer: Self-pay | Admitting: Orthopedic Surgery

## 2024-09-05 DIAGNOSIS — M79671 Pain in right foot: Secondary | ICD-10-CM

## 2024-09-05 DIAGNOSIS — M6701 Short Achilles tendon (acquired), right ankle: Secondary | ICD-10-CM
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
